# Patient Record
Sex: Female | Born: 1960
Health system: Southern US, Community
[De-identification: ages and names within clinical notes are randomized; demographics above are authoritative.]

## PROBLEM LIST (undated history)

## (undated) DIAGNOSIS — IMO0002 Reserved for concepts with insufficient information to code with codable children: Secondary | ICD-10-CM

## (undated) DIAGNOSIS — R3915 Urgency of urination: Secondary | ICD-10-CM

## (undated) DIAGNOSIS — R0600 Dyspnea, unspecified: Secondary | ICD-10-CM

## (undated) DIAGNOSIS — N821 Other female urinary-genital tract fistulae: Secondary | ICD-10-CM

## (undated) DIAGNOSIS — E785 Hyperlipidemia, unspecified: Secondary | ICD-10-CM

## (undated) DIAGNOSIS — Z9889 Other specified postprocedural states: Secondary | ICD-10-CM

## (undated) DIAGNOSIS — R87619 Unspecified abnormal cytological findings in specimens from cervix uteri: Secondary | ICD-10-CM

## (undated) DIAGNOSIS — E041 Nontoxic single thyroid nodule: Secondary | ICD-10-CM

## (undated) DIAGNOSIS — R112 Nausea with vomiting, unspecified: Secondary | ICD-10-CM

## (undated) DIAGNOSIS — A419 Sepsis, unspecified organism: Secondary | ICD-10-CM

## (undated) DIAGNOSIS — D649 Anemia, unspecified: Secondary | ICD-10-CM

## (undated) DIAGNOSIS — R5383 Other fatigue: Secondary | ICD-10-CM

## (undated) DIAGNOSIS — R7303 Prediabetes: Secondary | ICD-10-CM

## (undated) DIAGNOSIS — C539 Malignant neoplasm of cervix uteri, unspecified: Secondary | ICD-10-CM

## (undated) DIAGNOSIS — N6320 Unspecified lump in the left breast, unspecified quadrant: Secondary | ICD-10-CM

## (undated) DIAGNOSIS — R350 Frequency of micturition: Secondary | ICD-10-CM

## (undated) HISTORY — DX: Unspecified abnormal cytological findings in specimens from cervix uteri: R87.619

## (undated) HISTORY — DX: Hyperlipidemia, unspecified: E78.5

## (undated) HISTORY — PX: TUBAL LIGATION: SHX77

## (undated) HISTORY — PX: ABDOMINAL HYSTERECTOMY: SHX81

## (undated) HISTORY — DX: Reserved for concepts with insufficient information to code with codable children: IMO0002

## (undated) HISTORY — PX: OTHER SURGICAL HISTORY: SHX169

---

## 2001-07-23 ENCOUNTER — Other Ambulatory Visit: Admission: RE | Admit: 2001-07-23 | Discharge: 2001-07-23 | Payer: Self-pay | Admitting: Obstetrics & Gynecology

## 2001-09-10 ENCOUNTER — Encounter: Admission: RE | Admit: 2001-09-10 | Discharge: 2001-09-10 | Payer: Self-pay | Admitting: Obstetrics and Gynecology

## 2001-11-07 ENCOUNTER — Inpatient Hospital Stay (HOSPITAL_COMMUNITY): Admission: AD | Admit: 2001-11-07 | Discharge: 2001-11-09 | Payer: Self-pay | Admitting: *Deleted

## 2003-07-06 ENCOUNTER — Encounter: Admission: RE | Admit: 2003-07-06 | Discharge: 2003-07-06 | Payer: Self-pay | Admitting: Obstetrics and Gynecology

## 2003-07-31 ENCOUNTER — Ambulatory Visit (HOSPITAL_COMMUNITY): Admission: RE | Admit: 2003-07-31 | Discharge: 2003-07-31 | Payer: Self-pay | Admitting: Obstetrics and Gynecology

## 2003-07-31 ENCOUNTER — Encounter (INDEPENDENT_AMBULATORY_CARE_PROVIDER_SITE_OTHER): Payer: Self-pay | Admitting: Specialist

## 2003-08-15 ENCOUNTER — Encounter: Admission: RE | Admit: 2003-08-15 | Discharge: 2003-08-15 | Payer: Self-pay | Admitting: Obstetrics and Gynecology

## 2003-09-15 ENCOUNTER — Encounter: Admission: RE | Admit: 2003-09-15 | Discharge: 2003-09-15 | Payer: Self-pay | Admitting: Obstetrics and Gynecology

## 2003-10-12 ENCOUNTER — Encounter: Admission: RE | Admit: 2003-10-12 | Discharge: 2003-10-12 | Payer: Self-pay | Admitting: Obstetrics and Gynecology

## 2004-01-04 ENCOUNTER — Emergency Department (HOSPITAL_COMMUNITY): Admission: EM | Admit: 2004-01-04 | Discharge: 2004-01-04 | Payer: Self-pay | Admitting: Emergency Medicine

## 2004-01-07 ENCOUNTER — Emergency Department (HOSPITAL_COMMUNITY): Admission: EM | Admit: 2004-01-07 | Discharge: 2004-01-07 | Payer: Self-pay | Admitting: Emergency Medicine

## 2004-01-27 ENCOUNTER — Emergency Department (HOSPITAL_COMMUNITY): Admission: EM | Admit: 2004-01-27 | Discharge: 2004-01-27 | Payer: Self-pay | Admitting: Emergency Medicine

## 2005-02-01 ENCOUNTER — Inpatient Hospital Stay (HOSPITAL_COMMUNITY): Admission: AD | Admit: 2005-02-01 | Discharge: 2005-02-01 | Payer: Self-pay | Admitting: Obstetrics & Gynecology

## 2005-02-14 ENCOUNTER — Encounter: Admission: RE | Admit: 2005-02-14 | Discharge: 2005-02-14 | Payer: Self-pay | Admitting: Obstetrics

## 2005-05-27 ENCOUNTER — Inpatient Hospital Stay (HOSPITAL_COMMUNITY): Admission: AD | Admit: 2005-05-27 | Discharge: 2005-05-29 | Payer: Self-pay | Admitting: Obstetrics

## 2005-05-28 ENCOUNTER — Encounter (INDEPENDENT_AMBULATORY_CARE_PROVIDER_SITE_OTHER): Payer: Self-pay | Admitting: Specialist

## 2007-06-17 ENCOUNTER — Ambulatory Visit: Payer: Self-pay | Admitting: Obstetrics & Gynecology

## 2007-06-25 ENCOUNTER — Ambulatory Visit: Payer: Self-pay | Admitting: Gynecology

## 2007-06-25 ENCOUNTER — Encounter: Payer: Self-pay | Admitting: Obstetrics and Gynecology

## 2007-10-13 ENCOUNTER — Ambulatory Visit: Payer: Self-pay | Admitting: Gynecology

## 2007-12-02 ENCOUNTER — Ambulatory Visit: Payer: Self-pay | Admitting: Gynecology

## 2007-12-02 ENCOUNTER — Encounter (INDEPENDENT_AMBULATORY_CARE_PROVIDER_SITE_OTHER): Payer: Self-pay | Admitting: Gynecology

## 2008-08-22 ENCOUNTER — Emergency Department (HOSPITAL_COMMUNITY): Admission: EM | Admit: 2008-08-22 | Discharge: 2008-08-22 | Payer: Self-pay | Admitting: Emergency Medicine

## 2008-09-20 ENCOUNTER — Ambulatory Visit: Payer: Self-pay | Admitting: Obstetrics and Gynecology

## 2008-09-20 ENCOUNTER — Encounter: Payer: Self-pay | Admitting: Obstetrics and Gynecology

## 2008-09-27 ENCOUNTER — Ambulatory Visit: Payer: Self-pay | Admitting: Obstetrics and Gynecology

## 2008-09-27 ENCOUNTER — Inpatient Hospital Stay (HOSPITAL_COMMUNITY): Admission: AD | Admit: 2008-09-27 | Discharge: 2008-09-28 | Payer: Self-pay | Admitting: Obstetrics & Gynecology

## 2008-09-28 ENCOUNTER — Ambulatory Visit: Payer: Self-pay | Admitting: Obstetrics & Gynecology

## 2008-09-28 ENCOUNTER — Ambulatory Visit (HOSPITAL_COMMUNITY): Admission: RE | Admit: 2008-09-28 | Discharge: 2008-09-28 | Payer: Self-pay | Admitting: Obstetrics & Gynecology

## 2008-10-04 ENCOUNTER — Encounter: Admission: RE | Admit: 2008-10-04 | Discharge: 2008-10-04 | Payer: Self-pay | Admitting: Obstetrics & Gynecology

## 2008-10-04 ENCOUNTER — Ambulatory Visit: Payer: Self-pay | Admitting: Obstetrics and Gynecology

## 2008-10-25 ENCOUNTER — Ambulatory Visit: Payer: Self-pay | Admitting: Obstetrics & Gynecology

## 2008-11-29 ENCOUNTER — Ambulatory Visit: Payer: Self-pay | Admitting: Obstetrics & Gynecology

## 2008-12-06 ENCOUNTER — Ambulatory Visit: Payer: Self-pay | Admitting: Obstetrics and Gynecology

## 2008-12-28 ENCOUNTER — Ambulatory Visit: Payer: Self-pay | Admitting: Obstetrics & Gynecology

## 2009-01-04 ENCOUNTER — Ambulatory Visit: Payer: Self-pay | Admitting: Obstetrics & Gynecology

## 2009-01-05 ENCOUNTER — Encounter: Payer: Self-pay | Admitting: Obstetrics and Gynecology

## 2009-03-12 ENCOUNTER — Ambulatory Visit: Payer: Self-pay | Admitting: Obstetrics & Gynecology

## 2009-03-12 ENCOUNTER — Ambulatory Visit (HOSPITAL_COMMUNITY): Admission: RE | Admit: 2009-03-12 | Discharge: 2009-03-12 | Payer: Self-pay | Admitting: Obstetrics & Gynecology

## 2009-03-29 ENCOUNTER — Ambulatory Visit: Payer: Self-pay | Admitting: Obstetrics and Gynecology

## 2009-05-09 ENCOUNTER — Ambulatory Visit: Payer: Self-pay | Admitting: Obstetrics and Gynecology

## 2009-12-21 ENCOUNTER — Ambulatory Visit: Payer: Self-pay | Admitting: Obstetrics & Gynecology

## 2009-12-21 ENCOUNTER — Other Ambulatory Visit: Admission: RE | Admit: 2009-12-21 | Discharge: 2009-12-21 | Payer: Self-pay | Admitting: Gynecology

## 2010-01-04 ENCOUNTER — Ambulatory Visit: Payer: Self-pay | Admitting: Obstetrics and Gynecology

## 2010-01-15 ENCOUNTER — Ambulatory Visit (HOSPITAL_COMMUNITY): Admission: RE | Admit: 2010-01-15 | Discharge: 2010-01-15 | Payer: Self-pay | Admitting: Family Medicine

## 2010-03-06 ENCOUNTER — Encounter (INDEPENDENT_AMBULATORY_CARE_PROVIDER_SITE_OTHER): Payer: Self-pay | Admitting: Family Medicine

## 2010-03-06 ENCOUNTER — Ambulatory Visit: Payer: Self-pay | Admitting: Internal Medicine

## 2010-03-06 LAB — CONVERTED CEMR LAB
BUN: 12 mg/dL (ref 6–23)
Basophils Absolute: 0 10*3/uL (ref 0.0–0.1)
Basophils Relative: 1 % (ref 0–1)
Chloride: 105 meq/L (ref 96–112)
Cholesterol: 187 mg/dL (ref 0–200)
Creatinine, Ser: 0.6 mg/dL (ref 0.40–1.20)
Eosinophils Absolute: 0.2 10*3/uL (ref 0.0–0.7)
Eosinophils Relative: 3 % (ref 0–5)
Glucose, Bld: 100 mg/dL — ABNORMAL HIGH (ref 70–99)
LDL Cholesterol: 115 mg/dL — ABNORMAL HIGH (ref 0–99)
Lymphocytes Relative: 33 % (ref 12–46)
MCV: 81.5 fL (ref 78.0–100.0)
Monocytes Absolute: 0.4 10*3/uL (ref 0.1–1.0)
Sodium: 139 meq/L (ref 135–145)
TSH: 1.4 microintl units/mL (ref 0.350–4.500)
Total Protein: 7.9 g/dL (ref 6.0–8.3)
VLDL: 29 mg/dL (ref 0–40)
Vit D, 25-Hydroxy: 35 ng/mL (ref 30–89)
Vitamin B-12: 609 pg/mL (ref 211–911)
WBC: 5.7 10*3/uL (ref 4.0–10.5)

## 2010-04-04 LAB — CONVERTED CEMR LAB: Pap Smear: NEGATIVE

## 2010-04-16 ENCOUNTER — Ambulatory Visit: Payer: Self-pay | Admitting: Internal Medicine

## 2010-05-20 ENCOUNTER — Encounter (INDEPENDENT_AMBULATORY_CARE_PROVIDER_SITE_OTHER): Payer: Self-pay | Admitting: *Deleted

## 2010-05-20 LAB — CONVERTED CEMR LAB
Basophils Absolute: 0 10*3/uL (ref 0.0–0.1)
Basophils Relative: 1 % (ref 0–1)
Eosinophils Absolute: 0.1 10*3/uL (ref 0.0–0.7)
Eosinophils Relative: 2 % (ref 0–5)
Hemoglobin: 11.5 g/dL — ABNORMAL LOW (ref 12.0–15.0)
Lymphocytes Relative: 29 % (ref 12–46)
MCV: 85.6 fL (ref 78.0–100.0)
Monocytes Absolute: 0.4 10*3/uL (ref 0.1–1.0)
Neutro Abs: 3.5 10*3/uL (ref 1.7–7.7)
RBC: 4.31 M/uL (ref 3.87–5.11)

## 2010-11-09 LAB — BASIC METABOLIC PANEL
Chloride: 101 mEq/L (ref 96–112)
GFR calc Af Amer: >60 mL/min (ref >60)
GFR calc non Af Amer: >60 mL/min (ref >60)

## 2010-11-09 LAB — POCT URINALYSIS DIP (DEVICE)
Glucose, UA: NEGATIVE mg/dL
Specific Gravity, Urine: 1.015 (ref 1.005–1.030)
Urobilinogen, UA: 0.2 mg/dL (ref 0.0–1.0)
pH: 7 (ref 5.0–8.0)

## 2010-11-09 LAB — CBC
MCHC: 34.2 g/dL (ref 30.0–36.0)
RBC: 4.16 MIL/uL (ref 3.87–5.11)

## 2010-11-09 LAB — PREGNANCY, URINE: Preg Test, Ur: NEGATIVE

## 2010-11-18 LAB — URINALYSIS, ROUTINE W REFLEX MICROSCOPIC
Glucose, UA: NEGATIVE mg/dL
Ketones, ur: NEGATIVE mg/dL
Protein, ur: NEGATIVE mg/dL
Specific Gravity, Urine: 1.004 — ABNORMAL LOW (ref 1.005–1.030)
Urobilinogen, UA: 0.2 mg/dL (ref 0.0–1.0)

## 2010-11-18 LAB — GLUCOSE, CAPILLARY

## 2010-11-18 LAB — POCT PREGNANCY, URINE: Preg Test, Ur: NEGATIVE

## 2010-11-18 LAB — WET PREP, GENITAL: Trich, Wet Prep: NONE SEEN

## 2010-11-18 LAB — GC/CHLAMYDIA PROBE AMP, GENITAL: Chlamydia, DNA Probe: NEGATIVE

## 2010-11-18 LAB — URINE MICROSCOPIC-ADD ON

## 2010-11-19 LAB — POCT URINALYSIS DIP (DEVICE)
Bilirubin Urine: NEGATIVE
Glucose, UA: NEGATIVE mg/dL
Ketones, ur: NEGATIVE mg/dL
Protein, ur: NEGATIVE mg/dL
Specific Gravity, Urine: 1.01 (ref 1.005–1.030)

## 2010-12-17 NOTE — Group Therapy Note (Signed)
Amy Morrow, Amy Morrow NO.:  [id number]   MEDICAL RECORD NO.:  [id number]          PATIENT TYPE:  WOC   LOCATION:  [facility]                   FACILITY:  [facility]   PHYSICIAN:  Argentina Donovan, MD        DATE OF BIRTH:  1961-07-26   DATE OF SERVICE:                                  CLINIC NOTE   HISTORY:  The patient is a 50 year old Hispanic female gravida 5, para 5-  0-0-5 who is referred by the emergency room at [facility] for  urinary frequency, dysuria, pelvic pain and was treated with several  episodes in the last few months with Cipro with no relief.  She  apparently has had this problem prior to the birth of her last baby  which was 3 years ago.  Since December, it has gotten to be extremely  disabling.  She cannot even go to the store without looking for a  bathroom.  She was previously seen by Dr. Alfonzo Feller who was trying  to get her scheduled for a vaginal hysterectomy with A&P repair and TVT,  but could not to get it accomplished for financial reasons.  In addition  to that, she had previously had a LEEP for abnormal Pap smears.  Had her  last one last May which was normal.  We will repeat that today.   PHYSICAL EXAMINATION:  ABDOMEN:  Soft, flat, nontender.  No masses or  organomegaly.  GU:  She points to the immediate suprapubic area for where she has most  of her discomfort.  On examination, external genitalia is normal.  BUS  within normal limits.  There is marked descensus of her bladder and  obvious coughing uncontrollable squirts of urine.  The bulk of the  bladder is significantly beneath the urethra.  The uterus seems of  normal size, shape, consistency.  Adnexa could not be well palpated in  this slightly pudgy patient of 58 inches and 155 pounds.  Pap smear was  taken.  The vagina is clean and well rugated.  GENERAL:  Other than this, she is in good health.  VITAL SIGNS:  Blood pressure is normal at 128/85, pulse is 93 per   minute.   I think this patient does need surgery.  We are going to attempt to  relieve her symptoms to some degree.  I have put in a pessary number 7  to hopefully elevate the bladder and relieve some of her stress  incontinence.  I am going to try her on some Detrol because I think she  probably has either a hyperactive bladder or interstitial cystitis.  I  will try her on some peridium knowing that the Detrol will take several  weeks to work.  I am still going to have her come back so I can evaluate  that pessary and if she is feeling any better in a week.  Meanwhile, we  are going to be working on financial situations to see if this woman can  have definitive surgery.   IMPRESSION:  Urinary stress incontinence secondary to uterine prolapse  with marked cystorectocele.  Also chronic  dysuria, probable overactive  bladder, possible interstitial cystitis.           ______________________________  Argentina Donovan, MD     PR/MEDQ  D:  09/20/2008  T:  09/20/2008  Job:  [id number]

## 2010-12-17 NOTE — Group Therapy Note (Signed)
Amy Morrow, Amy Morrow NO.:  [id number]   MEDICAL RECORD NO.:  [id number]          PATIENT TYPE:  WOC   LOCATION:  [facility]                   [facility]ACILITY:  [facility]   PHYSICIAN:  Ginger Carne, MD DATE OF BIRTH:  1961/01/29   DATE OF SERVICE:  12/02/2007                                  CLINIC NOTE   This patient is a 50 year old Hispanic female who returns today for  reevaluation for previously scheduled surgery.  She has genuine urinary  stress incontinence without evidence of an overactive bladder,  a second  degree rectocele, second-degree cystocele, and first-degree uterovaginal  prolapse.  Discussion with the patient regarding her symptoms led to the  option of a pessary which she adamantly declined.  She had not contacted  the Financial Services department prior to her operation that was  scheduled on Dec 07, 2007 to discuss her financial options regarding  having a total vaginal hysterectomy anterior posterior repair, TVT with  cystoscopy, and uterosacral ligament vaginal vault suspension.  Therefore, the surgery was cancelled. The patient was counseled that it  would be necessary to discuss these matters with a financial counselor  prior to said surgery.  However, this was not considered life-  threatening and that a pessary would be advisable   The patient was seen in November of 2008. 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  Apparently the Pap smear from November 2008 was not brought to anyone's  attention, and the colposcopy had not been scheduled. Because it was  over 5 months since this last Pap smear was performed  I discussed with  the patient the option of performing colposcopy now and/or repeating a  Pap smear.  She opted for a repeat Pap smear and the agreement that if  it is abnormal, we will proceed with a colposcopy. I have  personally  assured the patient that I would call her with the results through an  interpreter. The patient was reassured that the nature of this Pap smear  was not significant as far as the possibility of a carcinoma of the  cervix and that no harm was done.  The patient was reassured and is not  upset.  I did explain to the patient that once her results are received  I would contact her. If her Pap smear is normal we will repeat another  Pap in 6 months to follow.  All questions were answered to the  satisfaction of said patient, and the patient verbalized understanding  of same through the interpreter.           ______________________________  Ginger Carne, MD     SHB/MEDQ  D:  12/02/2007  T:  12/02/2007  Job:  [id number]

## 2010-12-17 NOTE — Group Therapy Note (Signed)
Amy Morrow, Amy Morrow NO.:  [id number]   MEDICAL RECORD NO.:  [id number]          PATIENT TYPE:  WOC   [facility]ATION[facility]                   [facility]   PHYSICIAN:  Scheryl Darter, MD       DATE OF BIRTH:  [birth year]   DATE OF SERVICE:                                  CLINIC NOTE   The patient returns today for evaluation of stress urinary incontinence.  She was placed on Detrol at her last visit which she says has not helped  her symptoms much.  She says with the pessary in place, she does not  leak urine and does not have to get up to urinate at night as often.  She would like to have surgery if it is indicated, in order to correct  her stress urinary incontinence.  She is not interested in learning to  manage the pessary herself.  Pessary was removed and cleaned.  Urodynamics were done by placing a 10-French Foley catheter.  Her post  void residual was less than 10 mL.  Specimen was sent for urinalysis.  Sterile water was introduced into the bladder.  She had some mild  symptoms of urge at about 120 mL and this became more intense at 150 mL.  About 180 mL was introduced into the bladder.  There was no sign of  detrusor instability during the procedure.  The catheter was removed and  stress incontinence was demonstrated with a cough.  She was allowed to  void after that.   IMPRESSION:  Genuine stress urinary incontinence.   PLAN:  I offered Gynecare TVT.  We discussed the procedure and the risks  of failure, bleeding, infection or urinary tract damage during the  procedure.  The discussion was via interpreter.  She voiced  understanding.  She wishes to schedule this procedure as an outpatient.  The pessary was replaced today.      Scheryl Darter, MD     JA/MEDQ  D:  01/04/2009  T:  01/04/2009  Job:  [id number]

## 2010-12-17 NOTE — Group Therapy Note (Signed)
NAMELALISA, KIEHN NO.:  [id number]   MEDICAL RECORD NO.:  [id number]          PATIENT TYPE:  WOC   [facility]ATION[facility]                   [facility]   PHYSICIAN:  Scheryl Darter, MD       DATE OF BIRTH:  [birth year]   DATE OF SERVICE:  12/28/2008                                  CLINIC NOTE   The patient returns today after having a pessary placed by Dr. Okey Dupre on  Dec 06, 2008.  She says that she has no discomfort with the pessary,  although sometimes it feels as though it is trying to come out, but she  is able to push it back without any problems.  She does not want to try  to remove it and replace it.  It definitely has helped with  incontinence.  She is currently on Detrol LA and she says that helps  with getting up at night to urinate.  Physical exam external genitalia  appears normal pessary was removed and cleaned.  Vaginal mucosa appears  normal.  Uterus seems to be well supported.  She has minimal cystocele  and rectocele when she is supine with coughing. I did not note any  incontinence.  Upon standing she does demonstrate some mild pelvic  relaxation but no more than a first-degree cystocele to my exam.  She  may be a candidate to have a TVT for urinary incontinence.  I will  replace the pessary today.  She will return to do urodynamics in the  office.  She has a note stating that he she has 100% discount on her  hospital account, and I am told that this is good for a year.      Scheryl Darter, MD     JA/MEDQ  D:  12/28/2008  T:  12/28/2008  Job:  [id number]

## 2010-12-17 NOTE — Group Therapy Note (Signed)
NAMEKHALIS, HITTLE NO.:  [id number]   MEDICAL RECORD NO.:  [id number]          PATIENT TYPE:  WOC   [facility]ATION[facility]                   [facility]   PHYSICIAN:  Argentina Donovan, MD        DATE OF BIRTH:  [birth year]   DATE OF SERVICE:                                  CLINIC NOTE   The patient is a 50 year old Spanish-speaking Hispanic female gravida 5,  para 5-0-0-5 who has been seen by [place] since February 2010 because of  severe urinary stress incontinence, 2+ to 3+ cystocele with the mild  uterine prolapse.  This patient truly needs a vaginal hysterectomy with  an A and P repair and a TVT but has had financial reasons that this has  not been able to be done yet.  We are trying to get her cleared for  this.  She has been successful using a #6 pessary, but apparently during  the last time it was placed, the patient suffered pain and had to remove  it.  We replaced that today, told her if that is still bothering her  that we could use an inflatable one that she could remove to have coitus  and then reinsert.  We will teach her how to do that.  I really would  like to see this patient qualify for some help and get this definitive  surgery done.  She is going to come back in 2 weeks.  We will see how  she is doing, and meanwhile, she has been communicating with the people  to try and arrange the surgery.  We will see what we can do to expedite  that problem.           ______________________________  Argentina Donovan, MD     PR/MEDQ  D:  12/06/2008  T:  12/06/2008  Job:  [id number]

## 2010-12-17 NOTE — Group Therapy Note (Signed)
NAMEANIQUA, BRIERE NO.:  [id number]   MEDICAL RECORD NO.:  [id number]          PATIENT TYPE:  WOC   [facility]ATION[facility]                   [facility]ACILITY[facility]   PHYSICIAN:  Sid Falcon, CNM  DATE OF BIRTH:  06-29-1961   DATE OF SERVICE:                                  CLINIC NOTE   REASON FOR VISIT:  Postop check after a TVT performed on March 12, 2009,  by Dr. Scheryl Darter.  The patient is here with reports of decreased rest  incontinence; however, has had an increase of urinary urge, in which she  feels she needs to go multiple times a day; however, those of small  amount of urine.  Pain is intermittent occurring approximately 1-2 times  a day, relief with pain medication.  Here to discuss the possibility of  having a hysterectomy concerned because she still has lower pelvic pain  that is the same as prior to the surgery.  I would like to talk about a  possible hysterectomy.   PHYSICAL EXAMINATION:  GENERAL:  Alert and oriented x3.  No signs of  acute distress.  VITAL SIGNS:  Stable.  Temperature 98.4, pulse 76, blood pressure  111/68.  ABDOMEN:  Incision site well healed.  No signs of infection.  VAGINA:  No evidence of urine with forced coughing by the patient.  Area  is pink.  No signs of redness or abnormal discharge.   ASSESSMENT:  1. Pelvic pain.  2. Urge incontinence.   PLAN:  Discussed with patient to continue with pain medication.  We will  reschedule an appointment with Dr. Marice Potter to discuss possible surgical  procedure in 1-2 weeks.      Sid Falcon, CNM     WM/MEDQ  D:  03/29/2009  T:  03/30/2009  Job:  [id number]

## 2010-12-17 NOTE — Op Note (Signed)
Amy Morrow, Amy Morrow   MEDICAL RECORD NO.:  [id number]          PATIENT TYPE:  AMB   LOCATION:  [facility]                           [facility]   PHYSICIAN:  Scheryl Darter, MD       DATE OF BIRTH:  [birth year]   DATE OF PROCEDURE:  DATE OF DISCHARGE:  03/12/2009                               OPERATIVE REPORT   PROCEDURE:  Gynecare tension-free vaginal tape.   PREOPERATIVE DIAGNOSIS:  Stress urinary incontinence.   POSTOPERATIVE DIAGNOSIS:  Stress urinary incontinence.   SURGEON:  Scheryl Darter, MD   ANESTHESIA:  General.   ESTIMATED BLOOD LOSS:  50 mL.   COMPLICATIONS:  None.   DRAINS:  None.   COUNTS:  Correct.   OPERATIVE COURSE:  The patient gave written consent for Gynecare TVT for  stress urinary incontinence.  The patient identification was confirmed.  She was brought to the OR and general anesthesia was induced.  She was  placed in dorsal lithotomy position.  The perineum, vagina, and lower  abdomen were sterilely prepped and draped.  Her exam revealed minimal  pelvic relaxation.  Uterus was normal size.  A 0.5% Marcaine with  1:100,000 epinephrine was infiltrated at the vagina just below the  urethra at the midline and to the side of the urethra.  Likewise, this  was infiltrated in two spots suprapubically and behind the pubic bone.  A #11 blade was used to make a stab incision 2 cm off the midline on  both sides just above the symphysis pubis.  Likewise blade was used to  make a 1 cm incision 1 cm below the urethra at the midline of vagina.  The vaginal mucosa was undermined to the side with Metzenbaum scissors.  Bladder was drained with a Foley catheter and the catheter was removed  and replaced with Foley with a rigid guide.  This was used to manipulate  the urethra during the procedure.  The urethra was deviated away from  the right side and using the TVT needles.  Through the vaginal route,  the needle was introduced to the  right side through the incision and was  guided carefully behind the symphysis pubis and up through the anterior  abdominal wall and through the incision that was made on the right.  Cystoscopy was then done with sterile water and the video camera was  then used and there was no trauma in the bladder seen.  Likewise the  second needle was introduced in the left side of the urethra and behind  symphysis pubis and through the left incision suprapubically.  Repeat  cystoscopy likewise showed normal bladder.  Bladder was drained between  each insertion.  The needles were then pulled through and the TVT tape  was pulled up just beneath the urethra with a hemostat used to assure  that it was in tension free position.  The needles were then cut loose  and the plastic sheaths were removed.  The tape was trimmed just below  the skin.  The skin incisions were closed with interrupted 4-0 Vicryl  subcuticular  sutures.  The vaginal incision was closed with 2-0 Vicryl in running  locking suture and good hemostasis was seen at the end of procedure.  The patient tolerated procedure well without complications.  She was  brought in stable condition to recovery room.      Scheryl Darter, MD  Electronically Signed     JA/MEDQ  D:  03/12/2009  T:  03/13/2009  Job:  [id number]

## 2010-12-20 NOTE — Op Note (Signed)
Amy Morrow, Amy Morrow              ACCOUNT NO.:  [id number]   MEDICAL RECORD NO.:  [id number]          PATIENT TYPE:  INP   LOCATION:  9117                          [facility]   PHYSICIAN:  Kathreen Cosier, M.D.DATE OF BIRTH:  1961/03/20   DATE OF PROCEDURE:  05/28/2005  DATE OF DISCHARGE:                                 OPERATIVE REPORT   PREOPERATIVE DIAGNOSIS:  Multiparity.   OPERATION/PROCEDURE:  Postpartum tubal ligation.   ANESTHESIA:  Epidural.   DESCRIPTION OF PROCEDURE:  The patient was placed in the supine position,  abdomen was prepped and draped. Bladder emptied with straight catheter.  Midline subumbilical incision one inch long was made, carried down to the  fascia.  Fascia was cleaned and grasped with two Kochers.  The fascia and  peritoneum opened.  The left tube was grasped in the mid portion with the  Babcock clamp.  A 0 plain suture was placed on the mesosalpinx below the  portion of the tube within the clamp.  This was tied.  Approximately one  inch of tube cut.  Hemostasis satisfactory.  Procedure was done in exact  fashion on the other side.  Abdomen closed in layers.  Peritoneum and fascia  with continuous suture with 0 Dexon.  Skin closed with subcuticular stitch  of 4-0 Monocryl.  Blood loss minimal.  The patient tolerated the procedure  well and taken to the recovery room in good condition.           ______________________________  Kathreen Cosier, M.D.     BAM/MEDQ  D:  05/28/2005  T:  05/28/2005  Job:  [id number]

## 2010-12-20 NOTE — Discharge Summary (Signed)
NAMEJAYLEENA, Amy Morrow NO.:  [id number]   MEDICAL RECORD NO.:  [id number]          PATIENT TYPE:  INP   LOCATION:  9117                          [facility]   PHYSICIAN:  Kathreen Cosier, M.D.DATE OF BIRTH:  01-21-1961   DATE OF ADMISSION:  05/27/2005  DATE OF DISCHARGE:  05/29/2005                                 DISCHARGE SUMMARY   Patient is a 51 year old gravida 6, para 4-0-1-4, [facility] June 03, 2005 who  was admitted in labor and had a normal vaginal delivery.  Patient desired  sterilization and on October 25 underwent postpartum tubal ligation.  On  admission her hemoglobin was 11.2, postoperative 8.4, platelets 273/225,  white count 7.9/10.3.  RPR nonreactive.  The patient did well postpartum and  was discharged on the second postpartum day with a hemoglobin 8.4, to see me  in six weeks.   DISCHARGE MEDICATIONS:  Tylenol No.3, ferrous sulfate.   DISCHARGE DIAGNOSES:  Status post normal vaginal delivery at term and  postpartum tubal ligation.           ______________________________  Kathreen Cosier, M.D.     BAM/MEDQ  D:  06/18/2005  T:  06/18/2005  Job:  [id number]

## 2010-12-20 NOTE — Op Note (Signed)
Amy Morrow, Amy Morrow                          ACCOUNT NO.:  [id number]   MEDICAL RECORD NO.:  [id number]                   PATIENT TYPE:  AMB   LOCATION:  [facility]                                  [facility]   PHYSICIAN:  Phil D. Okey Dupre, M.D.                  DATE OF BIRTH:  [birth year]   DATE OF PROCEDURE:  07/31/2003  DATE OF DISCHARGE:                                 OPERATIVE REPORT   PREOPERATIVE DIAGNOSES:  1. Metrorrhagia.  2. Moderate cervical dysplasia, cervical intraepithelial neoplasia II.   POSTOPERATIVE DIAGNOSIS:  Pending pathology report.   PROCEDURES:  1. Dilatation and curettage.  2. Loop electrosurgical excision procedure conization of the cervix.   SURGEON:  Javier Glazier. Okey Dupre, M.D.   ANESTHESIA:  MAC plus local.   ESTIMATED BLOOD LOSS:  10 mL.   The procedure went as follows:  Under satisfactory MAC sedation with the  patient in the dorsal lithotomy position, the perineum and vagina prepped  and draped in the usual sterile manner, bimanual pelvic examination revealed  a uterus of normal size, shape, consistency, anteflexed, freely movable, and  normal, free adnexa.  A weighted speculum was placed in the posterior  fourchette of the vagina.  The anterior lip of a hypertrophied cervix was  grasped with a single-tooth tenaculum.  Xylocaine 1% 10 mL was injected in  each of the lateral paracervical angles at 8 and 4 o'clock for a  paracervical block.  Then 1 mL of 1% Xylocaine with 1:100,000 epinephrine  was injected in four areas each of the cervix at 1, 4, 8, and 10 o'clock for  hemostasis.  The uterine cavity was then sounded to a depth of 7 cm and the  cervical os dilated to a #7 Hegar dilator and the uterine cavity curetted  vigorously with a small serrated curette followed by exploration with the  polyp forceps, and the small amount of tissue that was obtained was sent for  pathologic diagnosis.  A 2 x 0.8 cm LEEP electrical loop was used to take a  loop biopsy  of the cervix using the 60 current.  This was sent for  pathologic diagnosis.  The area around the entire biopsy site was then  coagulated with the hot cautery at 50 watts, the ball cautery, and bleeding  areas within the biopsy site were cauterized with the same cautery and then  packed with Monsel's solution.  There was minimal bleeding during the  procedure with a total blood loss of probably 10 mL.  The patient tolerated  the procedure well.  The speculum was removed from the vagina and the  patient transferred to the recovery room in satisfactory condition.  Phil D. Okey Dupre, M.D.    PDR/MEDQ  D:  07/31/2003  T:  07/31/2003  Job:  [id number]

## 2010-12-27 ENCOUNTER — Other Ambulatory Visit (HOSPITAL_COMMUNITY): Payer: Self-pay | Admitting: Family Medicine

## 2010-12-27 DIAGNOSIS — Z1231 Encounter for screening mammogram for malignant neoplasm of breast: Secondary | ICD-10-CM

## 2011-01-15 ENCOUNTER — Ambulatory Visit (HOSPITAL_COMMUNITY)
Admission: RE | Admit: 2011-01-15 | Discharge: 2011-01-15 | Disposition: A | Discharge status: Home / Self Care | Payer: Self-pay | Source: Ambulatory Visit | Attending: Family Medicine | Admitting: Family Medicine

## 2011-01-15 DIAGNOSIS — Z1231 Encounter for screening mammogram for malignant neoplasm of breast: Secondary | ICD-10-CM

## 2011-03-07 ENCOUNTER — Other Ambulatory Visit: Payer: Self-pay

## 2011-04-30 ENCOUNTER — Inpatient Hospital Stay (INDEPENDENT_AMBULATORY_CARE_PROVIDER_SITE_OTHER)
Admission: RE | Admit: 2011-04-30 | Discharge: 2011-04-30 | Disposition: A | Discharge status: Home / Self Care | Payer: Self-pay | Source: Ambulatory Visit | Attending: Emergency Medicine | Admitting: Emergency Medicine

## 2011-04-30 DIAGNOSIS — J309 Allergic rhinitis, unspecified: Secondary | ICD-10-CM

## 2011-04-30 DIAGNOSIS — R05 Cough: Secondary | ICD-10-CM

## 2011-06-11 ENCOUNTER — Encounter: Payer: Self-pay | Admitting: *Deleted

## 2011-06-11 ENCOUNTER — Other Ambulatory Visit (HOSPITAL_COMMUNITY)
Admission: RE | Admit: 2011-06-11 | Discharge: 2011-06-11 | Disposition: A | Discharge status: Home / Self Care | Payer: Self-pay | Source: Ambulatory Visit | Attending: Family Medicine | Admitting: Family Medicine

## 2011-06-11 ENCOUNTER — Ambulatory Visit (INDEPENDENT_AMBULATORY_CARE_PROVIDER_SITE_OTHER): Payer: Self-pay | Admitting: Family Medicine

## 2011-06-11 VITALS — BP 142/82 | HR 92 | Temp 98.0°F | Ht 59.0 in | Wt 152.3 lb

## 2011-06-11 DIAGNOSIS — IMO0002 Reserved for concepts with insufficient information to code with codable children: Secondary | ICD-10-CM

## 2011-06-11 DIAGNOSIS — Z01812 Encounter for preprocedural laboratory examination: Secondary | ICD-10-CM

## 2011-06-11 DIAGNOSIS — R87612 Low grade squamous intraepithelial lesion on cytologic smear of cervix (LGSIL): Secondary | ICD-10-CM

## 2011-06-11 DIAGNOSIS — N870 Mild cervical dysplasia: Secondary | ICD-10-CM | POA: Insufficient documentation

## 2011-06-11 NOTE — Progress Notes (Signed)
Patient given informed consent, signed copy in the chart, time out was performed.  Placed in lithotomy position. Cervix viewed with speculum and colposcope after application of acetic acid.   Colposcopy adequate? yes Acetowhite lesions?11 o'clock Punctation?no Mosaicism?  no Abnormal vasculature?  no Biopsies?11 o'clock ECC?yes  Patient was given post procedure instructions.  She will return in 2-3 weeks for results.

## 2011-06-11 NOTE — Patient Instructions (Signed)
Colposcopa (Colposcopy)  La colposcopa es un procedimiento en el que se utiliza un microscopio luminoso especial (colposcopio). Sirve para examinar el cuello del tero y la vagina o la zona externa de la vagina, para buscar signos de enfermedad o anormalidades en las clulas. La derivarn a Music therapist (gineclogo) para Personal assistant. Durante el procedimiento podrn tomarle una biopsia (muestra de tejido), en caso que el profesional encuentre clulas anormales. La biopsia se enva al laboratorio para un anlisis completo y los resultados sern informados a su mdico. UNA MUJER PUEDE NECESITAR ESTE PROCEDIMIENTO SI:  Tiene un papanicolau anormal (muestra de clulas del cuello del tero).   Tiene una llaga en el cuello del tero y el papanicolau es normal).   El papanicolau indica la presencia del virus del papiloma humano (VPH). El virus puede producir verrugas genitales y est relacionado con el desarrollo de cncer cervical.   Se han observado verrugas genitales en el cuello del tero o en la zona externa de la vagina.   La madre de la paciente recibi el frmaco DES durante el [place].   Siente dolor durante las [employer].   Sufre hemorragias vaginales, especialmente despus de Sales promotion account executive.   Es Environmental consultant de un tratamiento previo.  ANTES DEL PROCEDIMIENTO  La colposcopa se realiza cuando no tenga el perodo menstrual.   Durante las 24 horas previas a la colposcopa no debe:   Child psychotherapist.   Usar tampones.   Aplicarse medicamentos, cremas o supositorios en la vagina.   Tener [employer].  PROCEDIMIENTO  La colposcopa se realiza estando la paciente recostada sobre su espalda, con los pies en los estribos de la camilla.   Dentro de la vagina se coloca el espculo para [place] y permitir al profesional la observacin del cuello del tero. Este es el mismo instrumento que se [place]  en el examen Papanicolau.   El colposcopio se coloca fuera de la vagina. Este instrumento se [place] para ampliar y examinar el cuello del tero, la vagina y la zona externa de la misma.   Se aplica una pequea cantidad de solucin lquida en la zona que se va a observar. La solucin se coloca con un aplicador de algodn. Este lquido facilita la observacin de clulas anormales.   El mdico aspirar la mucosidad y las clulas del canal del cuello del tero.   En ese momento podr tomar pequeas muestras de tejido para realizar una biopsia. Cuando se toma la biopsia podr sentir un pequeo dolor o molestia.   El Engineer, civil (consulting) la ubicacin de las zonas anormales y enviar las muestras de tejido al laboratorio para que sean [place].   Cuando el mdico realiza una biopsia de la vagina o de la zona externa, aplicar un anestsico local (novocana).  DESPUS DEL PROCEDIMIENTO  Podr sentir algunos clicos que desaparecern en algunos minutos. Sentir [employer].   Puede usar medicamentos de venta libre segn la indicacin de su mdico. No tome aspirina, ya que puede causar hemorragias.   Recustese algunos minutos si se siente mareada.   Podr tener un sangrado leve o una secrecin oscura que debe detenerse en [place].   Puede ser necesario que use apsitos durante [employer].  INSTRUCCIONES PARA EL CUIDADO DOMICILIARIO  Evite las [employer], las duchas vaginales y el uso de tampones durante Armed forces technical officer, o segn las indicaciones del mdico.   Tome slo la medicacin que le indic el profesional que la asiste.  Si utiliza pldoras anticonceptivas, contine tomndolas.   Durante su visita no contar con todos los [employer]. En este caso, tenga otra entrevista con su mdico para conocerlos. No piense que el resultado es normal si no tiene noticias de su mdico o de la institucin mdica. Es Copy seguimiento de todos los  [place] de [place].   Siga los consejos de su mdico con respecto a los [place], [place], visitas y Papanicolau de control.  SOLICITE ATENCIN MDICA SI:  Aparece una erupcin cutnea.   Tiene problemas con los medicamentos.  SOLICITE AYUDA MDICA DE INMEDIATO SI:  Tiene una hemorragia abundante o elimina cogulos.   La fiebre le sube a ms de 102 F (38.9 C), con o sin escalofros.   Observa flujo vaginal anormal.   Tiene clicos que no se alivian luego de tomar analgsicos.   Se siente mareada o sufre un desmayo.   Siente Physiological scientist.  Document Released: 07/21/2005 Document Revised: 04/02/2011 [facility] Patient Information 2012 [place], [place].

## 2011-07-11 ENCOUNTER — Encounter: Payer: Self-pay | Admitting: Advanced Practice Midwife

## 2011-07-11 ENCOUNTER — Ambulatory Visit (INDEPENDENT_AMBULATORY_CARE_PROVIDER_SITE_OTHER): Payer: Self-pay | Admitting: Advanced Practice Midwife

## 2011-07-11 VITALS — BP 116/77 | HR 92 | Temp 97.2°F | Ht 62.0 in | Wt 152.8 lb

## 2011-07-11 DIAGNOSIS — Z23 Encounter for immunization: Secondary | ICD-10-CM

## 2011-07-11 DIAGNOSIS — N870 Mild cervical dysplasia: Secondary | ICD-10-CM

## 2011-07-11 MED ORDER — INFLUENZA VIRUS VACC SPLIT PF IM SUSP
0.5000 mL | Freq: Once | INTRAMUSCULAR | Status: AC
  Administered 2011-07-11: 0.5 mL via INTRAMUSCULAR

## 2011-07-11 NOTE — Patient Instructions (Signed)
Papanicolau (Pap Test) El papanicolau consiste en la toma de [place] de clulas del cuello del tero de [place]. El cuello uterino es la abertura que comunica el tero con la vagina (canal de [place]). Las clulas se obtienen durante el examen plvico. Las clulas se observan al microscopio para ver si son normales o se estn desarrollando clulas cancerosas, o existen modificaciones que indican que se Sport and exercise psychologist. La displasia cervical es un trastorno en el que una mujer tiene modificaciones anormales en las clulas del cuello uterino. Estos cambios son un signo precoz de que puede Sport and exercise psychologist cervical. Tambin puede detectarse el virus del Engineer, technical sales (VPH) debido a que 4 tipos son responsables en el 70% de los casos de cncer cervical. Tambin puede detectar infecciones producidas por bacterias, hongos, protozoos y virus.  El cncer cervical es difcil de tratar y es menos probable que tenga un buen desenlace si no se trata. Detectar la enfermedad en sus estadios ms tempranos conduce a mejores resultados. Desde que se ha comenzado a Animal nutritionist, hace 60 aos, las muertes por cncer cervical disminuyeron en un 70%. Todas las mujeres deben hacer un examen Papanicolau. LOS FACTORES DE RIESGO PARA EL CNCER CERVICAL SON:   Ser sexualmente activa antes de los 18 aos.   Ser hija de una mujer que tom dietilstilbestrol (DES) durante el embarazo.   Tener un compaero sexual que ha sufrido cncer de pene.   Tener un compaero sexual cuya pareja haya sufrido cncer cervical o displasia cervical (las primeras modificaciones que indican que se puede Sport and exercise psychologist.   Debilidad en el sistema inmunolgico. Por ejemplo sufrir VIH u otros problemas de inmunodeficiencia.   Haber sufrido infecciones de transmisin sexual, como clamidia, gonorrea o VPH.   Tener un Papanicolau anormal o cncer en la vagina o la vulva.   Tener ms de un compaero sexual.   Stark Klein  de cncer cervical en la hermana o la madre.   No usar condones con un nuevo compaero sexual.   El consumo de cigarrillos.  Woody Seller UN PAPANICOLAU  Un Papanicolau se realiza para Film/video editor cervical.   El primer Papanicolaou debe realizarse los 21 aos.   [employer] 21 y los 29 aos debe repetirse [place].   Luego de los 30 aos, debe realizarse un Papanicolaou cada tres aos siempre que los 3 estudios anteriores sean normales.   Algunas mujeres sufren problemas mdicos que aumentan la probabilidad de Optician, dispensing cervical. Consulte a su mdico acerca de estos problemas. Es muy importante que le informe a su mdico si aparecen nuevos problemas poco despus de su ltimo Papanicolaou. En estos casos, el mdico podr indicar que se realice el Papanicolaou con ms frecuencia.   Estas recomendaciones son las mismas para todas las mujeres hayan recibido o no la vacuna para el VPH (virus del papiloma humano).   Si le han realizado una histerectoma por un problema que no era cncer u otra enfermedad que podra causar cncer, ya no necesitar un Papanicolaou. Sin embargo, aunque ya no necesite un Papanicolau, es una buena idea Chartered certified accountant examen regular para asegurarse de que no hay otros problemas.    Si tiene entre 25 y 27 aos y ha tenido un Papanicolaou normal en los ltimos 10 aos, ya no ser IT sales professional. Sin embargo, aunque ya no necesite un Papanicolau, es una buena idea Chartered certified accountant examen regular para asegurarse de que no hay otros problemas.  Si ha recibido Pharmacist, community para Management consultant cervical o para una enfermedad que podra causar cncer, necesitar realizar un Papanicolaou y controles durante al menos 20 aos de concluir el tratamiento   Si no ha tenido continuidad para [employer] Papanicolau, debern evaluarse nuevamente los factores de riesgo (como tener una nueva pareja sexual) para Chief Strategy Officer si deben [employer].    Algunas mujeres necesitarn realizarse estudios con ms frecuencia si tienen factores de riesgo para Engineer, structural.  PREPARACIN PARA EL PAPANICOLAU Debe realizarse durante las semanas anteriores a la [place]. No deben hacerse duchas vaginales ni tener [employer] 24 horas anteriores a la prueba. No colocarse cremas vaginales, diafragmas o tampones durante las 24 horas anteriores a la prueba. Para minimizar las molestias, podr vaciar la vejiga antes del examen. TOMA DE LA MUESTRA El profesional le har un examen plvico. Colocar un instrumento de metal o plstico (espculo) en la vagina. Esto se hace antes de que el mdico realice un examen bimanual de los rganos femeninos internos. Este instrumento le permite al mdico observar el interior de la vagina y Public affairs consultant el cuello uterino. Para tomar la [place] de las clulas de la abertura interna del cuello uterino, se utiliza un pequeo cepillo estril. Para raspar la zona externa del cuello uterino se utiliza una esptula de [place]. [place] de [employer]. Las [employer] se colocan en un portaobjetos de vidrio o en una pequea botella llena con un lquido especial. Luego las clulas se observarn en el microscopio de un laboratorio. Un especialista observar las clulas y determinar si son normales. RESULTADOS DEL PAPANICOLAU  Un Papanicolau normal no muestra clulas anormales ni evidencias de inflamacin.   La presencia de clulas que se desarrollan de manera anormal en la superficie del cuello uterino ser informada como un Papanicolau anormal. Se utilizan diferentes categoras de Librarian, academic. El profesional que la asiste comentar con usted la importancia de Proofreader. El Nurse, children's cuales son los controles que deber seguir o cuando deber Producer, television/film/video.   Si tiene dos  ms Pap anormales:   Le solicitarn que se haga una colposcopa. En esta prueba se observar el cervix  con un microscpico luminoso especial.   Tambin ser necesaria la toma de [place] de tejido (biopsia). Se realiza tomando una pequea [place] de tejido. La muestra es observada en el microscopio para hallar la causa de las clulas anormales. Asegrese de [employer] de su Papanicolau. Si no recibe los [address] de [place], comunquese con el consultorio de su mdico. Recuerde que es su responsabilidad obtener los [place] y Financial risk analyst cuales son los controles que debe seguir.  Document Released: 01/07/2008 Document Revised: 04/02/2011 [facility] Patient Information 2012 [place], [place].

## 2011-07-11 NOTE — Progress Notes (Signed)
  Subjective:    Patient ID: Amy Morrow, female    DOB: 1961-02-15, 50 y.o.   MRN: [id number]  HPI: Here for colpo results  Last Mammogram June,2012  Review of Systems     Objective:   Physical Exam  Colpo CIN 1     Assessment & Plan:  Repeat Pap in six months Flu vaccine given  Dorathy Kinsman 07/11/2011 12:16 PM

## 2011-08-05 DIAGNOSIS — N6320 Unspecified lump in the left breast, unspecified quadrant: Secondary | ICD-10-CM

## 2011-08-05 HISTORY — DX: Unspecified lump in the left breast, unspecified quadrant: N63.20

## 2011-11-05 ENCOUNTER — Ambulatory Visit: Payer: Self-pay | Admitting: Obstetrics and Gynecology

## 2012-01-09 ENCOUNTER — Encounter: Payer: Self-pay | Admitting: Advanced Practice Midwife

## 2012-01-09 ENCOUNTER — Ambulatory Visit (INDEPENDENT_AMBULATORY_CARE_PROVIDER_SITE_OTHER): Payer: Self-pay | Admitting: Advanced Practice Midwife

## 2012-01-09 VITALS — Temp 98.4°F | Ht <= 58 in | Wt 151.1 lb

## 2012-01-09 DIAGNOSIS — IMO0002 Reserved for concepts with insufficient information to code with codable children: Secondary | ICD-10-CM

## 2012-01-09 DIAGNOSIS — R87612 Low grade squamous intraepithelial lesion on cytologic smear of cervix (LGSIL): Secondary | ICD-10-CM

## 2012-01-09 NOTE — Progress Notes (Signed)
  Subjective:    Patient ID: Amy Morrow, female    DOB: 1961/08/03, 51 y.o.   MRN: [id number]  HPI  Patient presents for repeat pap today.  She has a history of LSIL, CIN I.  Last pap was August 2012, followed by colpo November 2012 which showed LSIL.  Patient denies any symptoms of vaginal bleeding, discharge, dysuria, or pelvic pain.    Her family physician is at [facility].   Review of Systems  Per HPI    Objective:   Physical Exam  Genitourinary: There is no rash, tenderness or lesion on the right labia. There is no rash, tenderness or lesion on the left labia. Cervix exhibits no motion tenderness, no discharge and no friability. Right adnexum displays no mass, no tenderness and no fullness. Left adnexum displays no mass, no tenderness and no fullness. Vaginal discharge found.  Scant amount of bleeding at cervix.       Assessment & Plan:  Repeat Pap following LSIL and colpo  Pap results pending F/U in Gyn clinic based on cytology results   Dr Tye Savoy  LEFTWICH-KIRBY, LISA, CNM

## 2012-01-09 NOTE — Assessment & Plan Note (Signed)
Repeated Pap smear today (7 months after colposcopy).  If LSIL is found, patient will need to return for repeat colposcopy.

## 2012-01-15 ENCOUNTER — Telehealth: Payer: Self-pay | Admitting: *Deleted

## 2012-01-15 NOTE — Telephone Encounter (Signed)
Message copied by Mannie Stabile on Thu Jan 15, 2012  9:54 AM ------      Message from: CONSTANT, PEGGY      Created: Thu Jan 15, 2012  9:45 AM      Regarding: LGSIL on pap       Please inform patient of unchanged abnormal pap (not worsening) and need for repeat pap smear in 6 months.            Peggy

## 2012-01-15 NOTE — Telephone Encounter (Signed)
[employer] Spanish Interpreter used [phone]. Called patient and notified of unchanged result of pap smear (note from Dr. Jolayne Panther) and that she'll need a 6 month repeat pap smear. Patient to call us in August to make that appointment per front desk. Patient agrees and states understanding.

## 2012-04-14 ENCOUNTER — Other Ambulatory Visit: Payer: Self-pay | Admitting: Obstetrics & Gynecology

## 2012-04-14 DIAGNOSIS — Z1231 Encounter for screening mammogram for malignant neoplasm of breast: Secondary | ICD-10-CM

## 2012-04-30 ENCOUNTER — Ambulatory Visit (HOSPITAL_COMMUNITY)
Admission: RE | Admit: 2012-04-30 | Discharge: 2012-04-30 | Disposition: A | Discharge status: Home / Self Care | Payer: Self-pay | Source: Ambulatory Visit | Attending: Obstetrics & Gynecology | Admitting: Obstetrics & Gynecology

## 2012-04-30 DIAGNOSIS — Z1231 Encounter for screening mammogram for malignant neoplasm of breast: Secondary | ICD-10-CM

## 2012-05-04 ENCOUNTER — Other Ambulatory Visit: Payer: Self-pay | Admitting: Obstetrics & Gynecology

## 2012-05-04 DIAGNOSIS — R928 Other abnormal and inconclusive findings on diagnostic imaging of breast: Secondary | ICD-10-CM

## 2012-05-24 ENCOUNTER — Encounter (HOSPITAL_COMMUNITY): Payer: Self-pay | Admitting: *Deleted

## 2012-06-01 ENCOUNTER — Ambulatory Visit (HOSPITAL_COMMUNITY): Payer: Self-pay

## 2012-06-07 ENCOUNTER — Encounter (HOSPITAL_COMMUNITY): Payer: Self-pay

## 2012-06-07 ENCOUNTER — Ambulatory Visit (HOSPITAL_COMMUNITY)
Admission: RE | Admit: 2012-06-07 | Discharge: 2012-06-07 | Disposition: A | Discharge status: Home / Self Care | Payer: Self-pay | Source: Ambulatory Visit | Attending: Obstetrics and Gynecology | Admitting: Obstetrics and Gynecology

## 2012-06-07 VITALS — BP 110/72 | Temp 98.8°F | Ht 59.0 in | Wt 147.4 lb

## 2012-06-07 DIAGNOSIS — Z1239 Encounter for other screening for malignant neoplasm of breast: Secondary | ICD-10-CM

## 2012-06-07 HISTORY — DX: Anemia, unspecified: D64.9

## 2012-06-07 NOTE — Progress Notes (Addendum)
Patient referred to [facility] by the [facility] to needing additional imaging of the left breast. Screening mammogram completed at the [facility] 04/30/2012.  Pap Smear:    Pap smear not performed today. Patients last Pap smear was 01/09/2012 at the [facility] and LSIL. Patients last two Pap smears have been LSIL. History of colposcopy June 11, 2011. Patient's follow up is a repeat Pap smear in 6 months from last Pap smear. Patient is scheduled to follow up with [facility] for a repeat Pap smear on Tuesday, July 13, 2012. Pap smear results above is in EPIC.  Physical exam: Breasts Breasts symmetrical. No skin abnormalities bilateral breasts. No nipple retraction bilateral breasts. No nipple discharge bilateral breasts. No lymphadenopathy. No lumps palpated bilateral breasts. Patient referred to the [facility] for left breast Diagnostic Mammogram and possible left breast ultrasound. Appointment scheduled for Tuesday, June 15, 2012 at 1010.    Pelvic/Bimanual No Pap smear completed today since last Pap smear was 01/09/2012. Pap smear not indicated per B[facility]CP guidelines.

## 2012-06-07 NOTE — Patient Instructions (Signed)
Taught patient how to perform BSE and gave educational materials to take home. Patient did not need a Pap smear today due to last Pap smear was 01/09/2012. Patient's last Pap smear was abnormal and follow up is a repeat Pap smear in 6 months. Patient's Pap smear is due in December 2013. Have scheduled an appointment for patient to come back to [facility] for her repeat Pap smear. Appointment scheduled for Tuesday, July 13, 2012. Patient referred to the [facility] for left breast Diagnostic Mammogram and possible left breast ultrasound. Appointment scheduled for Tuesday, June 15, 2012 at 1010. Patient aware of appointments and will be there.  Patient verbalized understanding.

## 2012-06-15 ENCOUNTER — Other Ambulatory Visit: Payer: Self-pay | Admitting: Obstetrics & Gynecology

## 2012-06-15 ENCOUNTER — Ambulatory Visit
Admission: RE | Admit: 2012-06-15 | Discharge: 2012-06-15 | Disposition: A | Discharge status: Home / Self Care | Payer: No Typology Code available for payment source | Source: Ambulatory Visit | Attending: Obstetrics & Gynecology | Admitting: Obstetrics & Gynecology

## 2012-06-15 DIAGNOSIS — R928 Other abnormal and inconclusive findings on diagnostic imaging of breast: Secondary | ICD-10-CM

## 2012-06-15 HISTORY — PX: BREAST BIOPSY: SHX20

## 2012-06-24 ENCOUNTER — Ambulatory Visit (INDEPENDENT_AMBULATORY_CARE_PROVIDER_SITE_OTHER): Payer: Self-pay | Admitting: Family Medicine

## 2012-06-24 ENCOUNTER — Encounter: Payer: Self-pay | Admitting: Family Medicine

## 2012-06-24 VITALS — BP 101/69 | HR 77 | Temp 98.3°F | Ht 59.0 in | Wt 147.0 lb

## 2012-06-24 DIAGNOSIS — Z8739 Personal history of other diseases of the musculoskeletal system and connective tissue: Secondary | ICD-10-CM | POA: Insufficient documentation

## 2012-06-24 DIAGNOSIS — D1801 Hemangioma of skin and subcutaneous tissue: Secondary | ICD-10-CM

## 2012-06-24 DIAGNOSIS — Z87898 Personal history of other specified conditions: Secondary | ICD-10-CM

## 2012-06-24 NOTE — Assessment & Plan Note (Signed)
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

## 2012-06-24 NOTE — Patient Instructions (Signed)
It was nice to meet you.  Please ask the front desk to make an appointment to apply for the orange card.  For your spots on your legs, try wearing compression hose that come up above your knees.  For the burning pain in your legs, after you get the [facility] card, we can make an appointment for you to have a test called Ankle Brachial Index done to test circulation.  Please come back and see me after you get the [facility] card so we can order your cholesterol and other blood work.   Fue Curator. Por favor, pregunte a la recepcin para hacer una cita para solicitar la tarjeta naranja. Para sus manchas en las piernas, intente usar medias de compresin que viene por encima de las rodillas. Para el dolor de ardor en las piernas, despus de obtener la tarjeta de [place], podemos hacer una cita para que usted tenga una prueba llamada ndice tobillo hecho para evaluar la circulacin. Por favor, vuelve a verme despus de recibir la [address] para que podamos ordenar el colesterol y otros anlisis de [place].

## 2012-06-24 NOTE — Progress Notes (Signed)
  Subjective:    Patient ID: Amy Morrow, female    DOB: 10/14/1960, 51 y.o.   MRN: [id number]  HPI  Latroya comes in to establish care. She says she has been told she has high blood pressure and high cholesterol in the past, but she is not taking any medications for them.   Her mammogram was abnormal and she is scheduled for a biopsy next Tuesday.   Her pap has been abnormal for the past two times, and she is seeing GYN in December again for that.   She complains of purple dots in the back of her knees, and also some burning pain that gets worse with walking a long distance.  She denies any chest pain or dyspnea or leg swelling.   Past Medical History  Diagnosis Date  . Abnormal Pap smear   . Hypertension   . Anemia   . Hyperlipidemia    Family History  Problem Relation Age of Onset  . Hypertension Mother   . Hypertension Father    History  Substance Use Topics  . Smoking status: Never Smoker   . Smokeless tobacco: Never Used  . Alcohol Use: No   Review of Systems See HPI    Objective:   Physical Exam BP 101/69  Pulse 77  Temp 98.3 F (36.8 C) (Oral)  Ht 4\' 11"  (1.499 m)  Wt 147 lb (66.679 kg)  BMI 29.69 kg/m2  LMP 04/18/2012 General appearance: alert, cooperative and no distress Lungs: clear to auscultation bilaterally Heart: regular rate and rhythm, S1, S2 normal, no murmur, click, rub or gallop Extremities: extremities normal, atraumatic, no cyanosis or edema.  She has three purple oval shaped lesions on the back of her left knee and one on the right knee.  The range from .5-1.5 cm.  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   Pulses: 2+ and symmetric DP and PT pulses.        Assessment & Plan:

## 2012-06-24 NOTE — Assessment & Plan Note (Signed)
Patient without risk factors for peripheral neuropathy or peripheral vascular disease.  However, will have her do ABI's in Rx clinic.

## 2012-06-29 ENCOUNTER — Other Ambulatory Visit: Payer: Self-pay | Admitting: Obstetrics & Gynecology

## 2012-06-29 ENCOUNTER — Ambulatory Visit
Admission: RE | Admit: 2012-06-29 | Discharge: 2012-06-29 | Disposition: A | Discharge status: Home / Self Care | Payer: No Typology Code available for payment source | Source: Ambulatory Visit | Attending: Obstetrics & Gynecology | Admitting: Obstetrics & Gynecology

## 2012-06-29 ENCOUNTER — Ambulatory Visit
Admission: RE | Admit: 2012-06-29 | Discharge: 2012-06-29 | Disposition: A | Discharge status: Home / Self Care | Payer: Self-pay | Source: Ambulatory Visit | Attending: Obstetrics & Gynecology | Admitting: Obstetrics & Gynecology

## 2012-06-29 DIAGNOSIS — R928 Other abnormal and inconclusive findings on diagnostic imaging of breast: Secondary | ICD-10-CM

## 2012-07-13 ENCOUNTER — Ambulatory Visit (HOSPITAL_COMMUNITY)
Admission: RE | Admit: 2012-07-13 | Discharge: 2012-07-13 | Disposition: A | Discharge status: Home / Self Care | Payer: Self-pay | Source: Ambulatory Visit | Attending: Obstetrics and Gynecology | Admitting: Obstetrics and Gynecology

## 2012-07-13 ENCOUNTER — Encounter (HOSPITAL_COMMUNITY): Payer: Self-pay

## 2012-07-13 VITALS — BP 100/68 | Temp 98.8°F | Ht 59.0 in | Wt 147.8 lb

## 2012-07-13 DIAGNOSIS — IMO0002 Reserved for concepts with insufficient information to code with codable children: Secondary | ICD-10-CM

## 2012-07-13 DIAGNOSIS — Z1239 Encounter for other screening for malignant neoplasm of breast: Secondary | ICD-10-CM

## 2012-07-13 NOTE — Progress Notes (Signed)
Patient came to [facility] today for a 6 month repeat Pap smear. No complaints today.  Pap Smear:    Completed Pap smear today. Patients last Pap smear was 01/09/2012 at the [facility] and LSIL. Patients last two Pap smears have been LSIL. History of colposcopy June 11, 2011. Pap smear results above is in EPIC.       Pelvic/Bimanual  Ext Genitalia No lesions, no swelling and no discharge observed on external genitalia.         Vagina Vagina pink and normal texture. No lesions and small amount of blood observed in vagina.          Cervix Cervix is present. Cervix pink and of normal texture. Cervical polyp observed on cervical os. Blood observed on cervical os. Patient referred to the [facility] for follow up on cervical polyp and post menopausal bleeding. Appointment scheduled for Thursday, July 29, 2012 at 1315.       Uterus Uterus is present and palpable. Uterus in normal position and normal size.       Adnexae Bilateral ovaries present and palpable. No tenderness on palpation.        Rectovaginal No rectal exam completed today since patient had no rectal complaints. Small hemorrhoid observed on rectal area.  Patient initially came to [facility] 06/07/2012.

## 2012-07-13 NOTE — Patient Instructions (Signed)
Discussed with patient how to perform BSE. Patient referred to the [facility] for follow up on cervical polyp and post menopausal bleeding. Appointment scheduled for Thursday, July 29, 2012 at 1315. Patient aware of appointment and will be there. Let patient know will follow up with her within the next couple weeks with results by letter or phone. Let patient know her next Pap smear will be due based on result of today's Pap smear. Patient verbalized understanding.

## 2012-07-13 NOTE — Addendum Note (Signed)
Encounter addended by: Saintclair Halsted, RN on: 07/13/2012 10:54 AM<BR>     Documentation filed: Visit Diagnoses, Notes Section

## 2012-07-29 ENCOUNTER — Ambulatory Visit (INDEPENDENT_AMBULATORY_CARE_PROVIDER_SITE_OTHER): Payer: Self-pay | Admitting: Family Medicine

## 2012-07-29 ENCOUNTER — Encounter: Payer: Self-pay | Admitting: Family Medicine

## 2012-07-29 VITALS — BP 113/80 | HR 88 | Temp 99.8°F | Ht 59.0 in | Wt 146.4 lb

## 2012-07-29 DIAGNOSIS — N841 Polyp of cervix uteri: Secondary | ICD-10-CM

## 2012-07-29 DIAGNOSIS — C539 Malignant neoplasm of cervix uteri, unspecified: Secondary | ICD-10-CM | POA: Insufficient documentation

## 2012-07-29 DIAGNOSIS — N951 Menopausal and female climacteric states: Secondary | ICD-10-CM

## 2012-07-29 DIAGNOSIS — N870 Mild cervical dysplasia: Secondary | ICD-10-CM

## 2012-07-29 NOTE — Assessment & Plan Note (Signed)
Nml last pap.

## 2012-07-29 NOTE — Progress Notes (Signed)
Patient referred here from [facility] for postmenopausal bleeding and cervical polyp found during pap screening.  Patient states she has not gone thru menopause and is still having irregular periods, last period 07/18/12, before that beginning of October. States has had a cold and maybe a fever.

## 2012-07-29 NOTE — Assessment & Plan Note (Signed)
Must have passed with heavy cycle.

## 2012-07-29 NOTE — Assessment & Plan Note (Signed)
If she has another heavy cycle, consider EMB.  Discussed with pt. At length.  Interpreter Dori used.

## 2012-07-29 NOTE — Patient Instructions (Addendum)
  Menorragia (Menorrhagia) La hemorragia uterina (sangrado del tero) disfuncional es diferente del perodo menstrual normal. Cuando los perodos son irregulares o hay ms hemorragia que lo habitual en usted, se denomina menorragia. La causa puede ser un desequilibrio hormonal, fsico o metablico u otros problemas. Es necesario Medical sales representative examen para que el profesional pueda tratar [employer] causas que son tratables. Si el problema contina, ser necesario realizar una dilatacin y curetaje. Este procedimiento [employer] en dilatar el cuello del tero (la apertura del tero o matriz), es decir, se lo estira para Technical brewer, y se raspa la superficie que tapiza el interior del tero. Un especialista (patlogo) examina en el microscopio el tejido que se retira para asegurarse que no haya nada preocupante que requiera un examen ms exhaustivo. INSTRUCCIONES PARA EL CUIDADO DOMICILIARIO  Si el profesional que la asiste le prescribe medicamentos, tmelos tal como se le indic. No cambie ni reemplace medicamentos sin consultarlo con Mining engineer.  Las hemorragias de larga duracin pueden tener como consecuencia un dficit de hierro. El profesional que la asiste podr prescribirle comprimidos de hierro. Esto ayuda a Scientific laboratory technician que el organismo pierde luego de una hemorragia abundante. Tome los medicamentos tal como se le indic. El hierro puede causarle estreimiento. Si esto es un problema, aumente el consumo de [place], frutas y [place].  No tome aspirina o medicamentos que la contengan desde una semana antes del perodo menstrual ni durante el mismo. La aspirina puede hacer que la hemorragia empeore.  Si necesita cambiar el apsito o el tampn mas de una vez cada 2 horas, permanezca en cama y descanse todo lo posible hasta que la hemorragia se detenga.  Consuma alimentos balanceados. Coma alimentos ricos en hierro. Por ejemplo, verduras de [employer], carne, hgado, huevos y panes y Medical laboratory scientific officer de  grano integral. No trate de perder peso hasta que la hemorragia anormal se detenga y los niveles de hierro en la sangre vuelvan a la normalidad. SOLICITE ATENCIN MDICA SI:  Debe cambiar el apsito o el tampn ms de una vez cada hora.  Si tiene nuseas (ganas de vomitar), vmitos, mareos o diarrea mientras toma los medicamentos.  Tiene algn problema que pueda relacionarse con el medicamento que est tomando. SOLICITE ATENCIN MDICA DE INMEDIATO SI:  Tiene fiebre.  Siente escalofros.  Sufre una hemorragia intensa o comienza a eliminar cogulos de [place].  Se siente mareado o sufre un desmayo. EST SEGURO QUE:   Comprende las instrucciones para el alta mdica.  Controlar su enfermedad.  Solicitar atencin mdica de inmediato segn las indicaciones. Document Released: 04/30/2005 Document Revised: 10/13/2011 [facility] Patient Information 2013 [place], [place].

## 2012-07-29 NOTE — Progress Notes (Signed)
  Subjective:    Patient ID: Amy Morrow, female    DOB: 04/25/1961, 51 y.o.   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  She reports continued cycles, q 2mos.  Not menopausal.  Last cycle was heavy of unclear etiology.  Other cycles have been normal.  Review of Systems  Constitutional: Negative for appetite change.  HENT: Negative for rhinorrhea and sneezing.   Respiratory: Negative for choking and shortness of breath.   Cardiovascular: Negative for leg swelling.  Gastrointestinal: Negative for vomiting, abdominal pain, diarrhea and constipation.  Genitourinary: Positive for vaginal bleeding. Negative for vaginal discharge and pelvic pain.       Objective:   Physical Exam  Vitals reviewed. Constitutional: She is oriented to person, place, and time. She appears well-developed and well-nourished.  HENT:  Head: Normocephalic and atraumatic.  Eyes: No scleral icterus.  Neck: Neck supple.  Cardiovascular: Normal rate and regular rhythm.   Pulmonary/Chest: Effort normal.  Abdominal: Soft.  Genitourinary: Vagina normal. No breast tenderness.       Cervix with large nabothian cysts with vascular markings.  No evidence of polyp noted.  Neurological: She is alert and oriented to person, place, and time.  Skin: Skin is warm and dry.          Assessment & Plan:

## 2012-08-05 NOTE — Addendum Note (Signed)
Encounter addended by: Saintclair Halsted, RN on: 08/05/2012  4:05 PM<BR>     Documentation filed: Visit Diagnoses, Charges VN

## 2013-01-21 ENCOUNTER — Ambulatory Visit (INDEPENDENT_AMBULATORY_CARE_PROVIDER_SITE_OTHER): Payer: No Typology Code available for payment source | Admitting: Obstetrics & Gynecology

## 2013-01-21 ENCOUNTER — Encounter: Payer: Self-pay | Admitting: Obstetrics & Gynecology

## 2013-01-21 VITALS — BP 111/78 | HR 83 | Temp 97.7°F | Ht 59.0 in | Wt 144.0 lb

## 2013-01-21 DIAGNOSIS — IMO0002 Reserved for concepts with insufficient information to code with codable children: Secondary | ICD-10-CM

## 2013-01-21 DIAGNOSIS — R6889 Other general symptoms and signs: Secondary | ICD-10-CM

## 2013-01-21 NOTE — Progress Notes (Signed)
Subjective:     Patient ID: Amy Morrow, female   DOB: [birth year], 52 y.o.   MRN: [id number]  HPI Pt told to f/u every 6 months for pap.  Had LGSIL in [place] 01/09/2012.  Had normal PAP 07/13/2012 but, did have an HPV done. Pt with no GYN complaints.   Review of Systems     Objective:   Physical Exam BP 111/78  Pulse 83  Temp(Src) 97.7 F (36.5 C) (Oral)  Ht 4\' 11"  (1.499 m)  Wt 144 lb (65.318 kg)  BMI 29.07 kg/m2  LMP 11/16/2012  GU: EGBUS: no lesions Vagina: no blood in vault Cervix: no lesion; no mucopurulent d/c; small polyp noted at os- unable to remove with Ringed forceps as it was too small.  Treated with silver nitrate.      Assessment:     F/u of abnormal PAP     Plan:     PAP with HPV  F/u 1 year or sooner prn abnormal PAP

## 2013-01-21 NOTE — Patient Instructions (Signed)
Abnormal Pap Test Information During a Pap test, the cells on the surface of your cervix are checked to see if they look normal, abnormal, or if they show signs of having been altered by a certain type of virus called human papillomavirus, or HPV. Cervical cells that have been affected by HPV are called dysplasia. Dysplasia is not cancer, but describes abnormal cells found on the surface of the cervix. Depending on the degree of dysplasia, some of the cells may be considered pre-cancerous and may turn into cancer over time if follow up with a caregiver is delayed.  WHAT DOES AN ABNORMAL PAP TEST MEAN? Having an abnormal pap test does not mean that you have cancer. However, certain types of abnormal pap tests can be a sign that a person is at a higher risk of developing cancer. Your caregiver will want to do other tests to find out more about the abnormal cells. Your abnormal Pap test results could show:   Small and uncertain changes that should be carefully watched.   Cervical dysplasia that has caused mild changes and can be followed over time.  Cervical dysplasia that is more severe and needs to be followed and treated to ensure the problem goes away.  Cancer.  When severe cervical dysplasia is found and treated early, it rarely will grow into cancer.  WHAT WILL BE DONE ABOUT MY ABNORMAL PAP TEST?  A colposcopy may be needed. This is a procedure where your cervix is examined using light and magnification.  A small tissue sample of your cervix (biopsy) may need to be removed and then examined. This is often performed if there are areas that appear infected.  A sample of cells from the cervical canal may be removed with either a small brush or scraping instrument (curette). Based on the results of the procedures above, some caregivers may recommend either cryotherapy of the cervix or a surgical LEEP where a portion of the cervix is removed. LEEP is short for "loop electrical excisional  procedure." Rarely, a caregiver may recommend a cone biopsy.This is a procedure where a small, cone-shaped sample of your cervix is taken out. The part that is taken out is the area where the abnormal cells are.  WHAT IF I HAVE A DYSPLASIA OR A CANCER? You may be referred to a specialist. Radiation may also be a treatment for more advanced cancer. Having a hysterectomy is the last treatment option for dysplasia, but it is a more common treatment for someone with cancer. All treatment options will be discussed with you by your caregiver. WHAT SHOULD YOU DO AFTER BEING TREATED? If you have had an abnormal pap test, you should continue to have regular pap tests and check-ups as directed by your caregiver. Your cervical problem will be carefully watched so it does not get worse. Also, your caregiver can watch for, and treat, any new problems that may come up. Document Released: 11/05/2010 Document Revised: 10/13/2011 Document Reviewed: 07/17/2011 [employer] Patient Information 2014 [employer].  

## 2013-01-25 ENCOUNTER — Ambulatory Visit (INDEPENDENT_AMBULATORY_CARE_PROVIDER_SITE_OTHER): Payer: No Typology Code available for payment source | Admitting: Family Medicine

## 2013-01-25 ENCOUNTER — Encounter: Payer: Self-pay | Admitting: Family Medicine

## 2013-01-25 VITALS — BP 109/74 | HR 96 | Temp 98.5°F | Ht 59.0 in | Wt 143.0 lb

## 2013-01-25 DIAGNOSIS — Z8739 Personal history of other diseases of the musculoskeletal system and connective tissue: Secondary | ICD-10-CM

## 2013-01-25 DIAGNOSIS — Z87898 Personal history of other specified conditions: Secondary | ICD-10-CM

## 2013-01-25 DIAGNOSIS — D1801 Hemangioma of skin and subcutaneous tissue: Secondary | ICD-10-CM

## 2013-01-25 DIAGNOSIS — R300 Dysuria: Secondary | ICD-10-CM | POA: Insufficient documentation

## 2013-01-25 LAB — POCT UA - MICROSCOPIC ONLY: RBC, urine, microscopic: >20

## 2013-01-25 LAB — POCT URINALYSIS DIPSTICK
Bilirubin, UA: NEGATIVE
Glucose, UA: NEGATIVE
Ketones, UA: NEGATIVE
Nitrite, UA: NEGATIVE
Spec Grav, UA: 1.025
pH, UA: 6

## 2013-01-25 MED ORDER — CEPHALEXIN 500 MG PO CAPS: 500.0000 mg | ORAL_CAPSULE | Freq: Two times a day (BID) | ORAL | Status: DC

## 2013-01-25 MED ORDER — TRIAMCINOLONE ACETONIDE 0.5 % EX OINT: TOPICAL_OINTMENT | Freq: Two times a day (BID) | CUTANEOUS | Status: DC

## 2013-01-25 NOTE — Assessment & Plan Note (Signed)
Will obtain ABI's.

## 2013-01-25 NOTE — Assessment & Plan Note (Signed)
UA concerning for UTI, Rx for Keflex 500 PO BID x 1 week.

## 2013-01-25 NOTE — Assessment & Plan Note (Signed)
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  Will try steroid ointment since they itch.  Reassured, but will monitor, consider biopsy if they increase in size.

## 2013-01-25 NOTE — Progress Notes (Signed)
  Subjective:    Patient ID: Amy Morrow, female    DOB: [birth year], 52 y.o.   MRN: [id number]  HPI:  Amy Morrow comes in with several complaints:   Spots on back of knee: have been there for 3 years but have gotten bigger, itch but do not hurt.  Has tried lotion on them without improvement.   Pain in legs/feet: Has burning pain, worse with standing for long time or walking, improves with rest.  Has not tried medications.   Pain with urination: since yesterday, endorses chills, no fevers, hurts every time she goes, has had poor appetiete.   Past Medical History  Diagnosis Date  . Abnormal Pap smear   . Hypertension   . Anemia   . Hyperlipidemia   . Polyp of cervix     History  Substance Use Topics  . Smoking status: Never Smoker   . Smokeless tobacco: Never Used  . Alcohol Use: No    Family History  Problem Relation Age of Onset  . Hypertension Mother   . Hypertension Father      ROS Pertinent items in HPI    Objective:  Physical Exam:  BP 109/74  Pulse 96  Temp(Src) 98.5 F (36.9 C) (Oral)  Ht 4\' 11"  (1.499 m)  Wt 143 lb (64.864 kg)  BMI 28.87 kg/m2  LMP 11/16/2012 General appearance: alert, cooperative and no distress Head: Normocephalic, without obvious abnormality, atraumatic Abd: +BS, soft, non-tender Legs: on back of L knee there are 3 hyperpigmented/hypertrophic purple areas, slightly raised, one on back of R knee.  These measure about 1 cm x.5 cm Normal sensation and reflexes of LE Pulses: 2+ and symmetric       Assessment & Plan:

## 2013-01-25 NOTE — Patient Instructions (Signed)
I want you to take keflex, an antibiotic for your urinary tract infection twice a day for a week.   Please put the steroid cream on the spots on the back of your leg twice a day.   Ask the front desk to make you an appointment in pharmacy clinic to see Dr. Raymondo Band for ABI's, this is a test to check your circulation.   Quiero que tomes Keflex, un antibitico para la infeccin del tracto [employer] veces al da durante una semana.   Por favor, ponga la crema con esteroides en las manchas en la parte posterior de la pierna dos veces al da.   Preguntar a la recepcin para que se cita en la clnica de la farmacia para ver al Dr. Raymondo Band de ABI, esta es una prueba para comprobar su circulacin.

## 2013-02-07 ENCOUNTER — Ambulatory Visit: Payer: No Typology Code available for payment source | Admitting: Pharmacist

## 2013-02-10 ENCOUNTER — Ambulatory Visit: Payer: No Typology Code available for payment source | Admitting: Pharmacist

## 2013-02-18 ENCOUNTER — Ambulatory Visit (INDEPENDENT_AMBULATORY_CARE_PROVIDER_SITE_OTHER): Payer: No Typology Code available for payment source | Admitting: Pharmacist

## 2013-02-18 VITALS — Ht 59.0 in | Wt 142.7 lb

## 2013-02-18 DIAGNOSIS — Z87898 Personal history of other specified conditions: Secondary | ICD-10-CM

## 2013-02-18 DIAGNOSIS — Z8739 Personal history of other diseases of the musculoskeletal system and connective tissue: Secondary | ICD-10-CM

## 2013-02-18 NOTE — Progress Notes (Signed)
S:  Patient arrives in good condition.  She is able to communicate modestly and preferred an interpreter.  She allowed [place] to conduct the physical evaluation and then when the interpreter, tori, arrived we completed the detailed interview.   Pain in b/l lower legs from the knees down to the end of the toes burning starts about after walking. Sitting down makes the pain better. Pain worsens with increased pace and walking up hills. Associated Symptoms: Sheets on her feet make her legs hurt; which has been occuring for the past 51yrs.  "No pain" at rest. Massaging her legs/feet with lotion and crossing her legs helps the pain.   Lower extremity Physical Exam includes no obvious deformity or wounds. Bilaterally.   ABI overall = 1.17. Right Arm 118 mmHg    Left Arm 116 mmHg Right ankle posterior tibial 138 mmHg     dorsalis pedis 118 mmHg Left ankle posterior tibial 138 mmHg    dorsalis pedis 118 mmHg  Normal ABI and low likelihood of PAD based on ABI of 1.17 in a patient with symptoms of burning pain in her lower extremities from he knees to the tips of her toes.  Patient states pain prevents her from sleeping and requested pain relieving medication. No change in medications were made at visit today. Results reviewed and written information provided.   F/U Clinic Visit with Dr. Birdie Sons at earliest convenience for additional work-up and management.  Total time in face-to-face counseling .  Patient seen with Wenda Low, MD Resident

## 2013-02-18 NOTE — Assessment & Plan Note (Signed)
Normal ABI and low likelihood of PAD based on ABI of 1.17 in a patient with symptoms of burning pain in her lower extremities from he knees to the tips of her toes.  Patient states pain prevents her from sleeping and requested pain relieving medication. No change in medications were made at visit today. Results reviewed and written information provided.   F/U Clinic Visit with Dr. Birdie Sons at earliest convenience for additional work-up and management.  Total time in face-to-face counseling .  Patient seen with Wenda Low, MD Resident

## 2013-02-18 NOTE — Patient Instructions (Addendum)
1) You have normal blood flow in your legs  2) Please follow-up with you doctor to discuss future treatment options.  Schedule on the way out.     1) tienes sangre normal flujo en las piernas  2) por favor seguimiento mdico para discutir las opciones de tratamiento futuras. Horario de salida.

## 2013-02-23 ENCOUNTER — Encounter: Payer: Self-pay | Admitting: Pharmacist

## 2013-02-23 NOTE — Progress Notes (Signed)
Patient ID: Amy Morrow, female   DOB: 1961-05-20, 52 y.o.   MRN: [id number] Reviewed: Agree with Dr. Macky Lower documentation and management.

## 2013-03-01 ENCOUNTER — Encounter: Payer: Self-pay | Admitting: Family Medicine

## 2013-03-01 ENCOUNTER — Ambulatory Visit (INDEPENDENT_AMBULATORY_CARE_PROVIDER_SITE_OTHER): Payer: No Typology Code available for payment source | Admitting: Family Medicine

## 2013-03-01 VITALS — BP 113/75 | HR 75 | Ht 59.0 in | Wt 140.6 lb

## 2013-03-01 DIAGNOSIS — Z8739 Personal history of other diseases of the musculoskeletal system and connective tissue: Secondary | ICD-10-CM

## 2013-03-01 DIAGNOSIS — D1801 Hemangioma of skin and subcutaneous tissue: Secondary | ICD-10-CM

## 2013-03-01 DIAGNOSIS — Z87898 Personal history of other specified conditions: Secondary | ICD-10-CM

## 2013-03-01 LAB — VITAMIN B12: Vitamin B-12: 457 pg/mL (ref 211–911)

## 2013-03-01 LAB — GLUCOSE, CAPILLARY: Glucose-Capillary: 96 mg/dL (ref 70–99)

## 2013-03-01 MED ORDER — GABAPENTIN 100 MG PO CAPS: 100.0000 mg | ORAL_CAPSULE | Freq: Three times a day (TID) | ORAL | Status: DC

## 2013-03-01 NOTE — Patient Instructions (Addendum)
Nice to meet you. We are going to check some labs today to see if we can figure out why you are having this burning. I have prescribed a medication to help with this burning pain. We will have you come back in a week for a biopsy of the lesions on the back of your legs.

## 2013-03-01 NOTE — Progress Notes (Signed)
Patient ID: Amy Morrow, female   DOB: 03/22/1961, 52 y.o.   MRN: [id number] Interpretor was used for this encounter.  HPI Patient presents with pain and a burning sensation in the soles of her feet bilaterally as well as pain in her shins and calves bilaterally. She has had these symptoms for 6-7 years and have increased in intensity. She feels the pain and burning sensation at all times, even at night, which makes her feel restless and have trouble sleeping. She also notes that at around the same time, she noticed a small skin lesion in her left popliteal fossa that was pinkish and raised, and over the years, there have been more to appear and they all have become dark brown, raised, and are itchy. Some cream has helped with the itch in the past, but nothing has worked to remove them. She has not had any biopsies of the lesions, either.     Review of Systems: see HPI  Objective:   Physical Exam HEENT: PERRLA, EOMI. NCAT.  Extrem: No edema. No pain to palpation.   MSK: Strength 5/5 throughout, no tenderness to palpation.  NEURO: Sensation to light tough intact in UE/LE b/l. CN2-12 grossly intact.  Skin: patient with 3 skin lesions in the right popliteal fossa and 2 in the left. These are elliptoid in shape. Are dark appearing. Rough and scaly. Non-tender BP 113/75  Pulse 75  Ht 4\' 11"  (1.499 m)  Wt 140 lb 9.6 oz (63.776 kg)  BMI 28.38 kg/m2  LMP 11/16/2012  See individual problems for A/P.

## 2013-03-01 NOTE — Progress Notes (Deleted)
Subjective:     Patient ID: Amy Morrow, female   DOB: 1960-12-20, 52 y.o.   MRN: [id number]  HPI Patient presents with pain and a burning sensation in the soles of her feet bilaterally as well as pain in her shins and calves bilaterally. She has had these symptoms for 6-7 years and have increased in intensity. She feels the pain and burning sensation at all times, even at night, which makes her feel restless and have trouble sleeping. She also notes that at around the same time, she noticed a small skin lesion in her left popliteal fossa that was pinkish and raised, and over the years, there have been more to appear and they all have become dark brown, raised, and are itchy. Some cream has helped with the itch in the past, but nothing has worked to remove them. She has not had any biopsies of the lesions, either.     Review of Systems   HEENT: Denies HA, dizziness, changes in vision.  CV: Denies chest pain or SOB. PULM: Denies cough, hemoptysis.  GI: Denies D/C/N/V, denies hematochezia, melena GU: Denies Hematuria, dysuria.  Extremities: Admits to burning sensation in feet and pain and tingling in lower legs bilaterally.  Skin: Admits to lesions in popliteal fossa bilaterally. Denies any other rashes or lesions.  Neuro: Denies N/B/T in upper extremities, admits to paresthesias and burning sensation in lower extremities and feet.   Objective:   Physical Exam HEENT: PEARLA, EOMI. NCAT.  CV: RRR, no m/r/g PULM: CTAB, no wheezes, rales, ronchi.  ABD: S/NT/ND, NBSx4. No masses.  Extrem: No femoral bruits to auscultation. Peripheral pulses 2+. No cyanosis, clubbing, edema. No pain to palpation.   MSK: Strength 5/5 UE/LE b/l at all sites.  NEURO: Sensation to light tough intact in UE/LE b/l. CN2-12 grossly intact.     Assessment:   1. Peripheral paresthesias.  2. Macular skin lesions.     Plan:     1. Peripheral paresthesias: Labs for Vit B12, CBG to rule out vitamin deficiency and  diabetes as a cause for peripheral neuropathy.   2. Macular skin lesions: 4-5 on left popliteal fossa, 2 on right popliteal fossa, dark brown and raised. Continue previously prescribed triamcinolone ointment for next week as needed, and return one week for a biopsy of the lesion.

## 2013-03-01 NOTE — Assessment & Plan Note (Signed)
Unsure of cause of this pain. Has had negative ABI. Will check B12 and DM with glucose. Will start on gabapentin 100 mg TID. Can increase as tolerated.

## 2013-03-01 NOTE — Assessment & Plan Note (Signed)
Steroid cream has not helped. Will plan to see back in one week for biopsy.

## 2013-03-04 ENCOUNTER — Encounter: Payer: Self-pay | Admitting: *Deleted

## 2013-03-07 ENCOUNTER — Other Ambulatory Visit: Payer: Self-pay | Admitting: Family Medicine

## 2013-03-07 DIAGNOSIS — Z1231 Encounter for screening mammogram for malignant neoplasm of breast: Secondary | ICD-10-CM

## 2013-03-10 ENCOUNTER — Encounter: Payer: Self-pay | Admitting: Family Medicine

## 2013-03-10 ENCOUNTER — Ambulatory Visit (INDEPENDENT_AMBULATORY_CARE_PROVIDER_SITE_OTHER): Payer: No Typology Code available for payment source | Admitting: Family Medicine

## 2013-03-10 VITALS — BP 107/69 | HR 82 | Temp 98.7°F | Ht 59.0 in | Wt 139.0 lb

## 2013-03-10 DIAGNOSIS — Z8739 Personal history of other diseases of the musculoskeletal system and connective tissue: Secondary | ICD-10-CM

## 2013-03-10 DIAGNOSIS — L989 Disorder of the skin and subcutaneous tissue, unspecified: Secondary | ICD-10-CM

## 2013-03-10 DIAGNOSIS — Z87898 Personal history of other specified conditions: Secondary | ICD-10-CM

## 2013-03-10 DIAGNOSIS — R21 Rash and other nonspecific skin eruption: Secondary | ICD-10-CM

## 2013-03-10 MED ORDER — GABAPENTIN 100 MG PO CAPS: 200.0000 mg | ORAL_CAPSULE | Freq: Three times a day (TID) | ORAL | Status: DC

## 2013-03-10 NOTE — Progress Notes (Signed)
Subjective:     Patient ID: Amy Morrow, female   DOB: 1961-01-26, 52 y.o.   MRN: [id number]  HPI Pt here for follow up for burning sensation in her legs and for lesions in b/l popliteal fossae.   Burning pain in legs The gabapentin 100mg  TID has been starting to help a little bit, but she only has been on it for 4 days. It has not made her overly sleepy and she would be ok with increasing the dose. The pain continues to be below the knees and intensifies distally and is most intense on the soles of her feet. It is worse with long periods of standing, which she does during her work. Tennis shoes help slightly.   Skin lesions Patient still complains of itching and is fine with US obtaining a biopsy today. The triamcinolone cream is not helping the itching.    Review of Systems Extremities: admits burning sensation in LE b/l below knee, intensifying in soles. No cyanosis, clubbing or edema.  Skin: Admits to pruritic lesions behind her knees bilaterally.  Remainder of ROS is negative.     Objective:   Physical Exam     CV: RRR, no m/r/g Pulm: CTAB Extremities: admits burning sensation in LE b/l below knee, intensifying in soles. No cyanosis, clubbing or edema.  Vascular: pulses 2+ globally Skin: 3 oblong slightly raised scaly lesions in left pop fossa and 1 in right pop fossa. No other legions or rashes anywhere else on body.   Assessment:     1. Skin lesions, NOS 2. Peripheral neuropathy, NOS     Plan:     1. Skin lesions: punch biopsy taken today using lidocaine/epi local anesthetic. Patient made aware of all risks of performing and not performing the procedure. Will have patient follow up with [place] in 1-2 weeks after results of biopsy return.  2. Peripheral neuropathy, NOS: some improvement on gabapentin. Increase dose to 200 mg PO TID.       PGY2 addendum: I have seen and examined this patient with the medical student and agree with the above note.  Briefly, patient is a  52 yo female who presents in f/u for burning sensation in her legs and skin lesions in popliteal fossa.  Burning sensation: has been on gabapentin 100 mg TID. Has been helping a little bit though has only been on it for 4 days. Does not make her sleepy. Burning is mostly in the bottoms of her feet, though also occurs up her legs to her knee. Worse with standing.  Skin lesions: in popliteal fossa. History outlined in previous visits note. [place] note helping. Is ok doing biopsy today.  PE: Gen: NAD Skin: left popliteal fossa with 3 elliptical hyperpigmented scaly 1-2 cm raised lesions, right popliteal fossa with 1 of these lesions Ext: bilateral LE with sensation intact, no edema BP 107/69  Pulse 82  Temp(Src) 98.7 F (37.1 C) (Oral)  Ht 4\' 11"  (1.499 m)  Wt 139 lb (63.05 kg)  BMI 28.06 kg/m2  LMP 11/16/2012  Skin biopsy: the punch biopsy procedure was described to the patient in detail, informed consent was obtained. The skin of the left popliteal fossa was cleaned with alcohol swab. Lidocaine/epi mixture was used to numb the skin. The area on the left popliteal fossa middle lesion superomedial portion was identified and the left popliteal fossa was cleaned with betadyne. A 4 mm punch biopsy was then made. Silver nitrate was used for hemostasis and antibiotic ointment and bandage was  placed.  A/P: please see individual problems for A/P  Marikay Alar, MD PGY2 [facility]

## 2013-03-10 NOTE — Patient Instructions (Addendum)
Nice to see you again. We will call with the results of the biopsy. If the area starts to bleed please apply pressure. If it does not stop bleeding please let us know. If the area becomes red and painful please let us know. I have increased your neurontin to 200 mg TID. This can make you drowsy so be cautious with this.

## 2013-03-10 NOTE — Progress Notes (Signed)
Interpreter Josph [place] Namihira for Dr Sonnenberg 

## 2013-03-11 DIAGNOSIS — L439 Lichen planus, unspecified: Secondary | ICD-10-CM | POA: Insufficient documentation

## 2013-03-11 NOTE — Assessment & Plan Note (Signed)
Biopsy taken. Will have return in 1-2 weeks to discuss results.

## 2013-03-11 NOTE — Assessment & Plan Note (Signed)
Slightly improved with neurontin 100 TID. Will increase to 200 mg TID.

## 2013-03-23 ENCOUNTER — Other Ambulatory Visit: Payer: Self-pay | Admitting: Family Medicine

## 2013-03-23 ENCOUNTER — Telehealth: Payer: Self-pay | Admitting: Family Medicine

## 2013-03-23 MED ORDER — CLOBETASOL PROPIONATE 0.05 % EX CREA: TOPICAL_CREAM | Freq: Two times a day (BID) | CUTANEOUS | Status: DC

## 2013-03-23 NOTE — Telephone Encounter (Signed)
Pt is aware about new medication.  [place]

## 2013-03-23 NOTE — Progress Notes (Signed)
Patient with biopsy indicating lichen planus. Will treat with clobetasol cream for 3 weeks and have patient f/u for further evaluation.

## 2013-03-31 ENCOUNTER — Ambulatory Visit (HOSPITAL_COMMUNITY): Payer: No Typology Code available for payment source

## 2013-04-05 ENCOUNTER — Ambulatory Visit (INDEPENDENT_AMBULATORY_CARE_PROVIDER_SITE_OTHER): Payer: No Typology Code available for payment source | Admitting: Family Medicine

## 2013-04-05 ENCOUNTER — Encounter: Payer: Self-pay | Admitting: Family Medicine

## 2013-04-05 VITALS — BP 116/77 | HR 82 | Temp 98.4°F | Wt 143.0 lb

## 2013-04-05 DIAGNOSIS — Z87898 Personal history of other specified conditions: Secondary | ICD-10-CM

## 2013-04-05 DIAGNOSIS — L439 Lichen planus, unspecified: Secondary | ICD-10-CM

## 2013-04-05 DIAGNOSIS — Z8739 Personal history of other diseases of the musculoskeletal system and connective tissue: Secondary | ICD-10-CM

## 2013-04-05 MED ORDER — GABAPENTIN 100 MG PO CAPS: 200.0000 mg | ORAL_CAPSULE | Freq: Three times a day (TID) | ORAL | Status: DC

## 2013-04-05 MED ORDER — BACITRACIN 500 UNIT/GM EX OINT: 1.0000 "application " | TOPICAL_OINTMENT | Freq: Two times a day (BID) | CUTANEOUS | Status: DC

## 2013-04-05 MED ORDER — CLOBETASOL PROPIONATE 0.05 % EX CREA: TOPICAL_CREAM | Freq: Every day | CUTANEOUS | Status: DC

## 2013-04-05 NOTE — Progress Notes (Signed)
  Subjective:    Patient ID: Amy Morrow, female    DOB: 1961/07/12, 52 y.o.   MRN: [id number]  HPI Patient is a 52 yo female who presents for f/u of skin lesions and burning sensation in legs.  Lichen planus: biopsy proven. Has been using clobetasol cream. Itching has improved. Lesions are getting better. Question of whether biopsy site was infected since there is a hardness there.  Neuropathy: improved. Taking gabapentin 100 mg TID. Feels this has helped. No drowsiness with this.  Review of Systems see HPI     Objective:   Physical Exam  Constitutional: She appears well-developed and well-nourished.  HENT:  Head: Normocephalic and atraumatic.  Cardiovascular:  2+ DP pulses  Neurological:  Sensation to light touch intact in bilateral LE  Skin:  left popliteal fossa with 3 elliptical hyperpigmented ~1 cm  lesions, right popliteal fossa with 1 of these lesions, less dark and raised than previously, no apparent scale to them, biopsy site with scab in place and minimal erythema, no fluctuance  BP 116/77  Pulse 82  Temp(Src) 98.4 F (36.9 C) (Oral)  Wt 143 lb (64.864 kg)  BMI 28.87 kg/m2  LMP 11/16/2012     Assessment & Plan:

## 2013-04-05 NOTE — Patient Instructions (Signed)
Nice to see you again. I'm glad your rash and burning are doing better. Please use the antibiotic ointment for the biopsy site.  Please only use the clobetasol cream 5 days out of the week. Please increase the gabapentin to 2 pills 3 times a day. If this makes you sleepy go back to the 1 pill three times a day.

## 2013-04-05 NOTE — Assessment & Plan Note (Signed)
Improved on gabapentin 100 mg TID, though still with some burning sensation. Will increase gabapentin to 200 mg TID. If gets drowsy with this she is to decrease back to 100 mg TID.

## 2013-04-05 NOTE — Assessment & Plan Note (Signed)
Biopsy revealed lichen planus. Treating with clobetasol cream. Improved significantly. Will decrease usage to once daily 5 days a week. Will continue to follow.

## 2013-04-07 ENCOUNTER — Ambulatory Visit (HOSPITAL_COMMUNITY): Admission: RE | Admit: 2013-04-07 | Payer: No Typology Code available for payment source | Source: Ambulatory Visit

## 2013-05-02 ENCOUNTER — Ambulatory Visit (HOSPITAL_COMMUNITY): Payer: Self-pay

## 2013-05-19 ENCOUNTER — Ambulatory Visit (HOSPITAL_COMMUNITY): Payer: Self-pay

## 2013-06-02 ENCOUNTER — Ambulatory Visit: Payer: Self-pay

## 2013-07-13 ENCOUNTER — Ambulatory Visit: Payer: Self-pay

## 2013-10-11 ENCOUNTER — Ambulatory Visit: Payer: Self-pay

## 2013-10-13 ENCOUNTER — Ambulatory Visit: Payer: No Typology Code available for payment source

## 2013-11-30 ENCOUNTER — Other Ambulatory Visit: Payer: Self-pay | Admitting: Family Medicine

## 2013-11-30 DIAGNOSIS — Z1231 Encounter for screening mammogram for malignant neoplasm of breast: Secondary | ICD-10-CM

## 2013-12-07 ENCOUNTER — Ambulatory Visit (HOSPITAL_COMMUNITY)
Admission: RE | Admit: 2013-12-07 | Discharge: 2013-12-07 | Disposition: A | Discharge status: Home / Self Care | Payer: No Typology Code available for payment source | Source: Ambulatory Visit | Attending: Family Medicine | Admitting: Family Medicine

## 2013-12-07 DIAGNOSIS — Z1231 Encounter for screening mammogram for malignant neoplasm of breast: Secondary | ICD-10-CM

## 2014-02-06 ENCOUNTER — Ambulatory Visit (INDEPENDENT_AMBULATORY_CARE_PROVIDER_SITE_OTHER): Payer: No Typology Code available for payment source | Admitting: Family Medicine

## 2014-02-06 ENCOUNTER — Encounter: Payer: Self-pay | Admitting: Family Medicine

## 2014-02-06 VITALS — BP 110/72 | HR 80 | Temp 98.4°F | Ht 59.0 in | Wt 141.0 lb

## 2014-02-06 DIAGNOSIS — G589 Mononeuropathy, unspecified: Secondary | ICD-10-CM

## 2014-02-06 DIAGNOSIS — G629 Polyneuropathy, unspecified: Secondary | ICD-10-CM

## 2014-02-06 DIAGNOSIS — M792 Neuralgia and neuritis, unspecified: Secondary | ICD-10-CM

## 2014-02-06 DIAGNOSIS — L439 Lichen planus, unspecified: Secondary | ICD-10-CM

## 2014-02-06 DIAGNOSIS — IMO0002 Reserved for concepts with insufficient information to code with codable children: Secondary | ICD-10-CM

## 2014-02-06 LAB — POCT GLYCOSYLATED HEMOGLOBIN (HGB A1C): HEMOGLOBIN A1C: 5.7

## 2014-02-06 MED ORDER — PREGABALIN 50 MG PO CAPS: 50.0000 mg | ORAL_CAPSULE | Freq: Three times a day (TID) | ORAL | Status: DC

## 2014-02-06 MED ORDER — TRIAMCINOLONE ACETONIDE 0.1 % EX CREA: 1.0000 "application " | TOPICAL_CREAM | Freq: Two times a day (BID) | CUTANEOUS | Status: DC

## 2014-02-06 NOTE — Assessment & Plan Note (Addendum)
Patient with pain in bilateral LE consistent with neuropathic pain. No signs of DVT on exam. A1c normal making diabetic contribution unlikely. Patient notes previously being on lyrica, so will give trial of this for her apparent neuropathic pain. Will see in follow-up in one month. Will need to consider further laboratory testing in the future for cause of this pain.

## 2014-02-06 NOTE — Progress Notes (Signed)
Patient ID: Amy Morrow, female   DOB: 09-07-1960, 53 y.o.   MRN: [id number]  Tommi Rumps, MD Phone: [phone]  Amy Morrow is a 53 y.o. female who presents today for same day appointment.  Neuropathic pain: patient has had burning pain in the bottom of her feet. Has been going on for 3 weeks. Has kept her from sleeping. The pain is unbearable after work. Notes it feels like a heaviness in her feet. She also notes the pain is on the anterior portion of her legs as well up to her knees. No pain or swelling in her calves. No recent long travel, no recent surgeries, no history of cancer. No history of blood clots. She has tried advil for this with some benefit. She is not taking the gabapentin. She notes lyrica has helped in the past.  Rash: she notes the rash in her popliteal fossa has not gotten better. There is the same rash on her right calf now. It itches. It is not erythematous. She has tried a cream on it that has been beneficial, though she does not know the name of this. Biopsy done previously showing lichenoid dermatitis. Previously treated with steroid cream.  Patient is a nonsmoker.   ROS: Per HPI   Physical Exam Filed Vitals:   02/06/14 0908  BP: 110/72  Pulse: 80  Temp: 98.4 F (36.9 C)    Gen: Well NAD HEENT: PERRL,  MMM Lungs: CTABL Nl WOB Heart: RRR no MRG Neuro: 5/5 strength in bilateral quads, hamstrings, plantar and dorsiflexion, sensation to light touch intact bilateral LE, monofilament testing normal Skin: several scattered hyperpigmented lesions on posterior right calf and in right popliteal fossa, no erythema, no excoriations MSK: no calf tenderness, swelling, or erythema, no tenderness to palpation of the anterior compartment of lower leg, neg homan sign Exts: Non edematous BL  LE, warm and well perfused.    Lab Results  Component Value Date   HGBA1C 5.7 02/06/2014    Assessment/Plan: Please see individual problem list.  # Healthcare  maintenance: not addressed, needs tdap and colo

## 2014-02-06 NOTE — Assessment & Plan Note (Signed)
Patient with new rash similar to prior rash now located on posterior calf. Will treat with triamcinolone cream. If not improving will consider biopsy for this issue. F/u in one month.

## 2014-02-06 NOTE — Patient Instructions (Signed)
Nice to see you. Your pain is most likely related to the nerves in your legs. We will treat this with lyrica. If this pain gets worse, you develop swelling or erythema in your legs, shortness of breath, or chest pain please seek medical attention. Please use the cream on your rash.  Dolor neuroptico (Neuropathic Pain) A menudo creemos que el dolor tiene una causa fsica. Si eliminamos la causa, el dolor debera irse. Los nervios en s mismos tambin pueden Engineer, drilling. Esto se denomina dolor neuroptico, que implica una anormalidad del nervio. Puede ser difcil para los pacientes que lo padecen y para los profesionales que los asisten. El dolor normalmente se describe como agudo (de corta duracin) o crnico (de larga duracin). El dolor agudo se relaciona con las sensaciones fsicas que puede provocar una lesin. Puede durar unos pocos segundos o muchas semanas, pero a menudo desaparece cuando la lesin se cura. El dolor crnico dura ms que el tiempo normal de curacin. En el dolor neuroptico, las fibras nerviosas pueden lesionarse o daarse. Entonces envan seales incorrectas a otros centros de Social research officer, government. El dolor que se siente es real, pero la causa no es fcil de Pension scheme manager.  CAUSAS El dolor crnico puede ser resultado de enfermedades como la diabetes y el herpes (una infeccin relacionada con la varicela), o debido a un traumatismo, una ciruga o una amputacin. Tambin puede ocurrir sin ninguna enfermedad o lesin conocida. Los nervios envan mensajes de dolor, an cuando no haya una causa identificable de tales mensajes.   Otras causas comunes de la neuropata incluyen diabetes, sndrome del Building control surveyor, o Sndrome de [employer] ([place]).  Como ocurre en todas las formas de dolor de espalda, si la neuropata no se trata correctamente, puede existir una serie de problemas asociados que pueden llevar a una espiral negativa para el paciente. Estos problemas incluyen depresin, insomnio,  sensacin de miedo y [place], Printmaker social reducida e incapacidad para realizar actividades cotidianas o trabajar.  El ejemplo ms dramtico y misterioso de Social research officer, government neuroptico es el denominado "sndrome del miembro fantasma". Esto ocurre cuando se amputa un brazo o una pierna debido a una enfermedad o lesin. El cerebro contina recibiendo mensajes de los nervios que originalmente transportaban los impulsos del [place]. Estos nervios envan seales incorrectas y Garment/textile technologist.  Es comn que el dolor neuroptico no tenga una causa. Responde pobremente al tratamiento para el dolor. El dolor neuroptico puede ocurrir luego de:  Herpes (Infeccin del virus Herpes Zoster).  Una sensacin de [employer] piel, causada comnmente por una lesin en el nervio perifrico.  Neuropata perifrica que es un dao generalizado en los nervios, a menudo causado por la diabetes o el alcoholismo.  Dolor del miembro fantasma luego de una amputacin.  Problemas en los nervios faciales (neuralgia del trigmino).  Esclerosis mltiple.  [place].  Dolor que Scientist, forensic y la quimioterapia.  Neuropata por atrapamiento, cuando el nervio sufre presin como en el sndrome del tnel carpiano.  Problemas de la espalda, la pierna y la cadera (citica).  Ciruga de la espalda o la espina dorsal.  Infeccin de HIV o SIDA, en donde los nervios se infectan con el virus. El profesional que le asiste le explicar los puntos de esta lista que pueden estar referidos a usted. [place] del dolor neuroptico son:   Es un dolor intenso, agudo, similar a Automotive engineer, punzante, como si le clavaran un cuchillo.  Sensacin de pinchazos.  Sensacin profunda  de [place], fro o [employer].  Debilidad persistente, hormigueo o debilidad.  La aparicion de [employer] de un toque suave u otro estmulo que normalmente no causara dolor.  Una mayor  sensibilidad hacia cosas que normalmente causan dolor (un pinchazo). Es comn que exista un dolor persistente a Proofreader de estmulos no dolorosos como un toque [place]. El dolor puede persistir por meses o aos luego de que se hayan curado los tejidos daados. Cuando esto ocurre, las seales de dolor ya no disparan la alarma ante agresiones presentes o prximas. El sistema de alarma en s no est funcionando correctamente. Dolor neuroptico Orthoptist de mejorar con [employer]. Para algunas personas, puede resultar en una incapacidad grave. Es importante saber que an un traumatismo grave en un miembro puede no producir una adecuada respuesta de proteccin Visual merchandiser.Algunas quemaduras, cortes y otras lesiones pueden pasar inadvertidas. Sin el tratamiento [place], estas lesiones pueden infectarse o producir discapacidad. Considere seriamente cualquier lesin y consulte a su mdico para recibir Clinical research associate. DIAGNSTICO Cuando tiene dolor sin causa conocida, el profesional que lo asiste Physicist, medical preguntas especficas:   Tiene otras enfermedades, como diabetes, herpes, esclerosis mltiple, o infeccin de HIV?  Cmo describira el dolor? (El dolor neuroptico a menudo se describe como punzante, lancinante, ardiente o abrasador.)  Empeora el dolor en algn momento del da? (El dolor neuroptico generalmente empeora por la noche.)  Siente que el dolor sigue determinados trayectos fsicos?  El dolor proviene de una zona que tiene nervios lesionados o ausentes? Un ejemplo sera el dolor del [employer].  El dolor se dispara por estmulos pequeos tales como el roce con las sbanas por las noches? Estas preguntas ayudan a definir el tipo de [employer]. Una vez que el profesional que lo asiste sepa lo que est ocurriendo podr Biochemist, clinical. Los antiespasmdicos, las drogas antidepresivas, y distintos analgsicos parecen funcionar en [employer]. Si  est involucrada alguna otra enfermedad, como la diabetes, un tratamiento mejor de esa enfermedad puede Best boy neuroptico.  TRATAMIENTO El dolor neuroptico a menudo es de larga duracin y tiende a no responder al tratamiento de medicamentos para el dolor de tipo narctico. Puede responder bien a otras drogas tales como anticonvulsivos y antidepresivos. Normalmente, los problemas neuropticos no se van nunca del todo, pero es posible lograr una mejora parcial con el tratamiento adecuado. Los profesionales que lo asisten tienen muchos medicamentos disponibles para tratar su problema. No se desanime si no obtiene alivio inmediato. En ocasiones deben probarse distintos medicamentos o combinaciones de ellos antes de que [employer]. Asegrese de ver al profesional que lo asiste si siente dolor que no parece provenir de ningn lado y no se va. La ayuda est a su alcance.  SOLICITE ATENCIN MDICA DE INMEDIATO SI:   Hay un cambio repentino en la calidad del dolor, especialmente si observa el cambio slo en un lado del cuerpo.  Nota modificaciones en la piel, como enrojecimiento, cambios en el color a negro o prpura, hinchazn o una lcera.  No puede mover el miembro afectado. Document Released: 10/28/2007 Document Revised: 10/13/2011 [facility] Patient Information 2015 [place]. This information is not intended to replace advice given to you by your health care provider. Make sure you discuss any questions you have with your health care provider.

## 2014-02-07 ENCOUNTER — Telehealth: Payer: Self-pay | Admitting: *Deleted

## 2014-02-07 MED ORDER — GABAPENTIN 100 MG PO CAPS: 100.0000 mg | ORAL_CAPSULE | Freq: Three times a day (TID) | ORAL | Status: DC

## 2014-02-07 NOTE — Telephone Encounter (Signed)
Message copied by Valerie Roys on Tue Feb 07, 2014  2:05 PM ------      Message from: Caryl Bis, ERIC G      Created: Mon Feb 06, 2014  5:57 PM       Please inform the patient that her A1c was normal and that she does not have diabetes. Thanks. ------

## 2014-02-07 NOTE — Telephone Encounter (Signed)
Pt is aware about result.  Also pt stated that pregabalin (LYRICA) 50 MG is very expensive $500 and she is unable to get it. Please change it.   [place]

## 2014-02-07 NOTE — Telephone Encounter (Signed)
I changed her to gabapentin. She is to take one tablet 3 times per day for 3 days, then increase to 2 tablets 3 times per day. I will see her back in the next 3-4 weeks. If this has helped we can increase the dose at that time. Please inform the patient of this. Thanks.

## 2014-02-07 NOTE — Telephone Encounter (Signed)
Will forward to [place] to call back and interpret message for her.  Edana Aguado,CMA

## 2014-02-09 NOTE — Telephone Encounter (Signed)
Pt is aware about Dr's instruction.  [place]

## 2014-03-09 ENCOUNTER — Ambulatory Visit: Payer: No Typology Code available for payment source | Admitting: Family Medicine

## 2014-06-05 ENCOUNTER — Encounter: Payer: Self-pay | Admitting: Family Medicine

## 2014-11-06 ENCOUNTER — Other Ambulatory Visit: Payer: Self-pay | Admitting: Family Medicine

## 2014-11-15 ENCOUNTER — Other Ambulatory Visit: Payer: Self-pay | Admitting: Obstetrics & Gynecology

## 2014-11-15 DIAGNOSIS — Z1231 Encounter for screening mammogram for malignant neoplasm of breast: Secondary | ICD-10-CM

## 2014-11-22 ENCOUNTER — Ambulatory Visit (HOSPITAL_COMMUNITY): Payer: Self-pay | Attending: Obstetrics & Gynecology

## 2014-12-15 ENCOUNTER — Ambulatory Visit: Payer: Self-pay | Admitting: Obstetrics & Gynecology

## 2015-01-11 ENCOUNTER — Ambulatory Visit (HOSPITAL_COMMUNITY)
Admission: RE | Admit: 2015-01-11 | Discharge: 2015-01-11 | Disposition: A | Discharge status: Home / Self Care | Payer: Self-pay | Source: Ambulatory Visit | Attending: Obstetrics and Gynecology | Admitting: Obstetrics and Gynecology

## 2015-01-11 ENCOUNTER — Ambulatory Visit (HOSPITAL_COMMUNITY)
Admission: RE | Admit: 2015-01-11 | Discharge: 2015-01-11 | Disposition: A | Discharge status: Home / Self Care | Payer: Self-pay | Source: Ambulatory Visit | Attending: Obstetrics & Gynecology | Admitting: Obstetrics & Gynecology

## 2015-01-11 ENCOUNTER — Encounter (HOSPITAL_COMMUNITY): Payer: Self-pay

## 2015-01-11 VITALS — BP 110/72 | Temp 98.7°F | Ht 59.0 in | Wt 148.0 lb

## 2015-01-11 DIAGNOSIS — Z01419 Encounter for gynecological examination (general) (routine) without abnormal findings: Secondary | ICD-10-CM

## 2015-01-11 DIAGNOSIS — Z1231 Encounter for screening mammogram for malignant neoplasm of breast: Secondary | ICD-10-CM

## 2015-01-11 NOTE — Progress Notes (Signed)
CLINIC:  Breast & Cervical Cancer Control Program (BCCCP) Clinic  REASON FOR VISIT: Well-woman exam with routine gynecological exam  HISTORY OF PRESENT ILLNESS:  Amy Morrow is a 54 y.o. female who presents to the [facility] today for clinical breast exam and routine gynecological exam.  She has no family history of breast cancer. Her last pap smear was in 01/2013 and was normal.  Her pap smear in 07/2012 was also normal.  However, she does have a history of several abnormal pap smears requiring further evaluation with colposcopy. In 12/2009, colposcopy revealed chronic cervicitis and squamous metaplasia with atypia.  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  She has no complaints today.   REVIEW OF SYSTEMS:  She denies breast pain, nodularity, nipple inversion, or nipple retraction.  She denies any abnormal vaginal bleeding, bloating, or pelvic pain.    ALLERGIES: No Known Allergies  CURRENT MEDICATIONS:  Current Outpatient Prescriptions on File Prior to Encounter  Medication Sig Dispense Refill  . acetaminophen (TYLENOL) 325 MG tablet Take 650 mg by mouth every 6 (six) hours as needed.    . bacitracin 500 UNIT/GM ointment Apply 1 application topically 2 (two) times daily. (Patient not taking: Reported on 01/11/2015) 15 g 0  . gabapentin (NEURONTIN) 100 MG capsule Take 1 capsule (100 mg total) by mouth 3 (three) times daily. (Patient not taking: Reported on 01/11/2015) 90 capsule 1  . triamcinolone cream (KENALOG) 0.1 % Apply 1 application topically 2 (two) times daily. (Patient not taking: Reported on 01/11/2015) 30 g 0   No current facility-administered medications on file prior to encounter.    PHYSICAL EXAM:  Vitals:  Filed Vitals:   01/11/15 1350  BP: 110/72  Temp: 98.7 F (37.1 C)    General: Well-nourished, well-appearing female in no acute distress.  She is unaccompanied in clinic today.  Rolena Infante, LPN and Tamsen Snider, Spanish language interpreter were present  during physical exam for this patient.  Breasts: Bilateral breasts exposed and observed with patient standing (arms at side, arms on hips, arms on hips flexed forward, and arms over head).  Right breast, subareolar region with healed scar.  Pt states she had "abscess" removed several years ago.  No other gross abnormalities including breast skin puckering or dimpling noted on observation.  Breasts symmetrical without evidence of skin redness, thickening, or peau d'orange appearance. No nipple retraction or nipple discharge noted bilaterally.  No breast nodularity palpated in bilateral breasts.  Axillary lymph nodes: No axillary lymphadenopathy bilaterally.   GU:  -External genitalia: No lesions, swelling, or discharge. Even hair distribution as expected.  -Vagina: Pink, moist. No lesions or discharge noted in vaginal canal.  -Cervix: Cervix red and friable. Cervix shape irregular. Cervical os patent.   -Uterus: Bimanual exam demonstrates no uterine mass or tenderness on palpation. Uterus in normal position and normal size.  -Adnexae: Bimanual exam demonstrates no ovarian masses or tenderness on palpation.  -Rectovaginal: No lesions noted to rectum. Rectal tone intact.  No masses or nodularity palpated by bimanual rectovaginal exam.     ASSESSMENT & PLAN:   1. Breast cancer screening: Amy Morrow has no palpable breast abnormalities on her clinical breast exam today.  She will receive her screening mammogram as scheduled.  She will be contracted by the imaging center for results of the mammogram, either by letter or phone within the next few weeks.  She was given instructions and educational materials regarding breast self-awareness. Ms. Korber is aware of this plan and agrees  with it.   2. Cervical cancer screening: Amy Morrow has an abnormal pelvic exam today based on the physical exam findings of her cervix.  A pap smear was completed today per protocol.  She tolerated the procedure without  complaints.  There was scant cervical bleeding as a result of the pap smear procedure.  She will be contacted by one of our [place] in the next few weeks to review the results of the pap smear with the patient.    Amy Morrow was encouraged to ask questions and all questions were answered to her satisfaction.    Mike Craze, NP [place]  [phone]

## 2015-01-15 LAB — CYTOLOGY - PAP

## 2015-01-24 ENCOUNTER — Telehealth (HOSPITAL_COMMUNITY): Payer: Self-pay | Admitting: *Deleted

## 2015-01-24 NOTE — Telephone Encounter (Signed)
Telephoned patient at home # and discussed negative pap smear results. HPV was negative. Next pap smear due in 3 years. Patient voiced understanding. Used interpreter Lavon Paganini.

## 2016-01-18 ENCOUNTER — Other Ambulatory Visit: Payer: Self-pay | Admitting: Obstetrics and Gynecology

## 2016-01-18 DIAGNOSIS — Z1231 Encounter for screening mammogram for malignant neoplasm of breast: Secondary | ICD-10-CM

## 2016-01-31 ENCOUNTER — Telehealth: Payer: Self-pay

## 2016-01-31 ENCOUNTER — Encounter (HOSPITAL_COMMUNITY): Payer: Self-pay

## 2016-01-31 ENCOUNTER — Ambulatory Visit
Admission: RE | Admit: 2016-01-31 | Discharge: 2016-01-31 | Disposition: A | Discharge status: Home / Self Care | Payer: No Typology Code available for payment source | Source: Ambulatory Visit | Attending: Obstetrics and Gynecology | Admitting: Obstetrics and Gynecology

## 2016-01-31 ENCOUNTER — Ambulatory Visit (HOSPITAL_COMMUNITY)
Admission: RE | Admit: 2016-01-31 | Discharge: 2016-01-31 | Disposition: A | Discharge status: Home / Self Care | Payer: Self-pay | Source: Ambulatory Visit | Attending: Obstetrics and Gynecology | Admitting: Obstetrics and Gynecology

## 2016-01-31 VITALS — BP 118/70 | Temp 98.7°F | Ht 58.5 in | Wt 158.2 lb

## 2016-01-31 DIAGNOSIS — Z1239 Encounter for other screening for malignant neoplasm of breast: Secondary | ICD-10-CM

## 2016-01-31 DIAGNOSIS — Z1231 Encounter for screening mammogram for malignant neoplasm of breast: Secondary | ICD-10-CM

## 2016-01-31 NOTE — Telephone Encounter (Signed)
Called per Anastasio Auerbach per interpreter. Patient informed about [facility] and scheduled for an appointment on July 6 at 8:15 AM.

## 2016-01-31 NOTE — Patient Instructions (Addendum)
Educational materials on breast self awareness given. Explained to Amy Morrow that she did not need a Pap smear today due to last Pap smear was 01/11/2015. Let her know BCCCP will cover Pap smears every 3 years unless has a history of abnormal Pap smears. Explained to patient that she has had three normal Pap smears since last abnormal and that she will be due for her next Pap smear in June 2019. Referred patient to the [facility] for a screening mammogram. Appointment scheduled for Thursday, January 31, 2016 at [place]. Let patient know the Breast Center will follow up with her within the next couple weeks with results by letter or phone. [place] verbalized understanding.  [place], Arvil Chaco, RN 9:54 AM

## 2016-01-31 NOTE — Progress Notes (Signed)
No complaints today.   Pap Smear:  Pap smear not completed today. Last Pap smear was 01/11/2015 in [facility] and normal. Patient's two Pap smears prior 01/21/2013 and 07/14/2012 were normal. Patient has a history of several abnormal pap smears prior to last three Pap smears that required further evaluation. Pap smear 01/09/2012 was LSIL and a repeat Pap smear in 6 months was completed that was normal. 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  Physical exam: Breasts Breasts symmetrical. No skin abnormalities bilateral breasts. No nipple retraction bilateral breasts. No nipple discharge bilateral breasts. No lymphadenopathy. No lumps palpated bilateral breasts. No complaints of pain or tenderness on exam. Referred patient to the [facility] for a screening mammogram. Appointment scheduled for Thursday, January 31, 2016 at [place].        Pelvic/Bimanual No Pap smear completed today since last Pap smear was 01/11/2015. Pap smear not indicated per BCCCP guidelines.   Smoking History: Patient has never smoked.  Patient Navigation: Patient education provided. Access to services provided for patient through [facility] program. Spanish interpreter provided.   Colorectal Cancer Screening: Patient has no history of a colonoscopy. No complaints today.  Used Spanish interpreter [employer] from [place].

## 2016-02-04 ENCOUNTER — Encounter (HOSPITAL_COMMUNITY): Payer: Self-pay | Admitting: *Deleted

## 2016-02-06 ENCOUNTER — Telehealth: Payer: Self-pay

## 2016-02-06 NOTE — Telephone Encounter (Signed)
Called back to make sure that knew how to get to [facility].

## 2016-02-06 NOTE — Telephone Encounter (Signed)
Called per Amy Morrow  to remind of [place] appointment and to be fasting. Patient was given address of [place] at [facility].

## 2016-02-07 ENCOUNTER — Ambulatory Visit (HOSPITAL_BASED_OUTPATIENT_CLINIC_OR_DEPARTMENT_OTHER): Payer: Self-pay

## 2016-02-07 ENCOUNTER — Ambulatory Visit: Payer: No Typology Code available for payment source

## 2016-02-07 VITALS — BP 118/70 | HR 76 | Temp 98.6°F | Resp 16 | Ht 59.0 in | Wt 158.8 lb

## 2016-02-07 DIAGNOSIS — Z0000 Encounter for general adult medical examination without abnormal findings: Secondary | ICD-10-CM

## 2016-02-07 NOTE — Progress Notes (Signed)
Patient is a new patient to the [facility] program and is currently a BCCCP patient effective 01/31/2016.   Clinical Measurements: Patient is 4 ft. 11 inches, weight 158.9 lbs, and BMI 32.2.   Medical History: Patient has history of high cholesterol 4 years ago and was on medication but not since then. Presently no medication. Patient does not have a history of hypertension or diabetes. Per patient no diagnosed history of coronary heart disease, heart attack, heart failure, stroke/TIA, vascular disease or congenital heart defects.  Blood Pressure, Self-measurement: Patient states has no reason to check Blood pressure.  Nutrition Assessment: Patient stated that eats 2 fruits every day. Patient states she eats 1 serving of vegetables a day. Per patient does eat 3 or more ounces of whole grains daily. Patient doesn't eat two or more servings of fish weekly. Patient states that eats fish once a week. Patient states she does drink more than 36 ounces or 450 calories of beverages with added sugars weekly. Patient stated she does watch her salt intake.   Physical Activity Assessment: Patient stated she has trouble with feet and does not exercise.. Patient stated  probably been doing 60 minutes of moderate exercise a week and no vigorous exercise.  Smoking Status: Patient stated that has never smoked and is not exposed to smoke.  Quality of Life Assessment: In assessing patient's physical quality of life she stated that out of the past 30 days that she has felt her health was good all days. Patient also stated that in the past 30 days that her mental health was not good including stress, depression and problems with emotions for 4 days. Patient did state that out of the past 30 days she felt her physical or mental health had not kept her from doing her usual activities including self-care, work or recreation for all days.   Plan: Lab work will be done today including a lipid panel and Hgb A1C. Will call lab  results when they are finished. Patient will receive Health Coaching for risk reduction initially and then health coaching as needed by patient.

## 2016-02-07 NOTE — Patient Instructions (Signed)
Discussed health assessment with patient. Will start eating more omega three fish. She will be called with results of lab work and we will then discussed any further follow up the patient needs. Patient verbalized understanding.

## 2016-02-07 NOTE — Progress Notes (Signed)
IRST HEALTH COACH: During initial screening with Anastasio Auerbach interpreter present, wellness health coaching was started.  Nutrition: Discussed with patient that normally should eat 2 servings of fruits and 2 to 21/2 of vegetables a day with a serving being 1/2 cup or small piece. Informed patient that two servings of fish were recommended per week, being ones with omega threes. Listed the fishes with omega three's for patient.Discussed that should have 5 to 7 ounces of grains a day. Explained to patient that should eat fruits and vegetables high in fiber but 3 of her 5 to 7 grains of fiber should be whole grain. Examples of whole grains were given.  Informed that need to decrease amount of sugar intake by avoiding fine sugars and too much fruit juice. Four ounces of juice is a serving of fruit. Discussed decreasing salt intake by mainly adding some while cook and none at table. The patient stated that is what she does.  Physical Activity:  Patient was Informed that minimal moderate activity is 150 minutes a week or 75 minutes of vigorous. Patient needs to increase her minimal moderate exercise. Demonstrated chair exercises. Discussed adding in some flexeibily and balance exercises.  PLAN: Will set goal when call lab results and do second health coaching. Will think about what goals may want to set.

## 2016-02-08 ENCOUNTER — Telehealth: Payer: Self-pay

## 2016-02-08 LAB — LIPID PANEL
CHOL/HDL RATIO: 4.6 ratio — AB (ref 0.0–4.4)
Cholesterol, Total: 224 mg/dL — ABNORMAL HIGH (ref 100–199)
HDL: 49 mg/dL (ref >39)
LDL Calculated: 153 mg/dL — ABNORMAL HIGH (ref 0–99)
Triglycerides: 112 mg/dL (ref 0–149)
VLDL Cholesterol Cal: 22 mg/dL (ref 5–40)

## 2016-02-08 LAB — HEMOGLOBIN A1C
Est. average glucose Bld gHb Est-mCnc: 128 mg/dL
HEMOGLOBIN A1C: 6.1 % — AB (ref 4.8–5.6)

## 2016-02-08 NOTE — Telephone Encounter (Signed)
LAB RESULTS: Interpreter Anastasio Auerbach called to inform patient about lab work from 02/07/16. Interpreter informed patient: cholesterol- 224, HDL- 49, LDL- 153, triglycerides - 112,  HBG-A1C - 6.1, and BMI 32.2.  SECOND HEALTH COACHING: Did risk reduction Heach Coaching related to BMI/Weight, cholesterol, LDL, and A1C. We discussed how starches break down into sugar in the body and the need to watch size and number of servings that eat a day. Disussed how better to have starches high in fiber like brown or wild rice. Discussed need to only have 6 servings of starches a day.  We talked about importance of exercise and increasing fiber in the diet. Discussed the need to loose weight, cut back calories, serving sizes, avoid refined foods, and choose healthier fats. Examples were given of each of the past subject matter.  Patient was told that she could call me anytime. I was going to let her work on the troubled areas and would call back in a month to see how is doing and do further coaching or see if wants to come in for person to person coaching.  PLAN: Patient will put above recommendations slowly into action. Patient and I will discuss more health coaching concerning patients exercise, nutrition and weight loss goals. Will call patient in a month.  Time: 20 minutes

## 2016-03-20 ENCOUNTER — Telehealth: Payer: Self-pay

## 2016-03-20 NOTE — Telephone Encounter (Addendum)
[place] Patient called per phone today for Health Coaching with interpreter Lavon Paganini present. Patient's areas of concern were weight, cholesterol, LDL, and exercise.  HEALTH COACHING: Patient stated had decreased the amount of bread, rice, and grease she is eating. Per patient has increased the amount of vegetables that she is eating. Patient stated that is following what told her to do. When interpreter talked with patient more she was eating too much fruit and was not real familiar with which foods were starches. Patient stated that is walking 60 minutes on most days. Per patient all of this has helped her feel much better.Patient states that can not tell that lost any weight.  Interpreter and patient talked about needing to recheck A1C within three to six months so patient is interested in getting primary care doctor. Decided that would leave packer at front desk for patient to pick up and that patient will call interpreter when she comes to go over materials. It was stressed to patient that A1C needs to be checked in three to six months.  PLAN:  Will continue with increasing exercise. Will continue with the good meal and nutrition changes. Will be doing follow up assessment when call next.  TIME: 15 minutes

## 2016-09-15 ENCOUNTER — Telehealth: Payer: Self-pay

## 2016-09-15 NOTE — Telephone Encounter (Signed)
Left message regarding need for Follow-up with [facility] program. Call assisted by Benjamine Sprague - Interpretor.

## 2017-05-20 ENCOUNTER — Encounter (HOSPITAL_COMMUNITY): Payer: Self-pay

## 2017-12-07 ENCOUNTER — Ambulatory Visit (INDEPENDENT_AMBULATORY_CARE_PROVIDER_SITE_OTHER): Payer: No Typology Code available for payment source | Admitting: Obstetrics & Gynecology

## 2017-12-07 ENCOUNTER — Encounter: Payer: Self-pay | Admitting: Obstetrics & Gynecology

## 2017-12-07 VITALS — BP 118/88 | HR 75 | Ht <= 58 in | Wt 149.9 lb

## 2017-12-07 DIAGNOSIS — N888 Other specified noninflammatory disorders of cervix uteri: Secondary | ICD-10-CM

## 2017-12-07 DIAGNOSIS — N939 Abnormal uterine and vaginal bleeding, unspecified: Secondary | ICD-10-CM

## 2017-12-07 NOTE — Progress Notes (Signed)
History:  57 y.o. [id number] here today for eval of AUB. Pt reports bleeding mainly with intercourse. Pt reports pt that prior to April she has no bleeding for 10 years. In April pt started having bleeding.  It is less today but, still with spotting.    The following portions of the patient's history were reviewed and updated as appropriate: allergies, current medications, past family history, past medical history, past social history, past surgical history and problem list.  Review of Systems:  Pertinent items are noted in HPI.    Objective:  Physical Exam Last menstrual period 11/16/2012.  CONSTITUTIONAL: Well-developed, well-nourished female in no acute distress.  HENT:  Normocephalic, atraumatic EYES: Conjunctivae and EOM are normal. No scleral icterus.  NECK: Normal range of motion SKIN: Skin is warm and dry. No rash noted. Not diaphoretic.No pallor. [place]: Alert and oriented to person, place, and time. Normal coordination.  Pelvic: Normal appearing external genitalia; 3 cm mass on cervix.Dionisio David. Exam stopped   Labs and Imaging 01/11/2015 Adequacy Reason Satisfactory for evaluation, endocervical/transformation zone component PRESENT. Diagnosis NEGATIVE FOR INTRAEPITHELIAL LESION OR MALIGNANCY. BENIGN REACTIVE/REPARATIVE CHANGES. Willeen Niece MD Pathologist, Electronic Signature (Case signed 01/16/2015) Source CervicoVaginal Pap [ThinPrep Imaged] Molecular Results Test Result HPV High Risk NOT DETECTED   Assessment & Plan:  PMPB- cervical mass very suspicious for cervical cancer noted. Biopsy NOT done  Will refer pt to [facility]  Pt to come back ASAP for bx  I emphasized to pt the importance of getting in ASAP and getting back for her bx.    I have explained to her my concern that I suspect cervical cancer. Pt expressed  understanding and will f/u with [facility]     The entire visit was communicated via an interpreter.   Toma Arts L. Harraway-Smith, M.D., Cherlynn June

## 2017-12-07 NOTE — Patient Instructions (Addendum)
La menopausia (Menopause) La menopausia es el perodo normal de la vida en el cual los perodos menstruales cesan por completo. Se completa cuando no se tienen 12 perodos menstruales consecutivos. Ocurre generalmente en mujeres entre los 40 y los 55 aos. Muy rara vez una mujer tiene la menopausia antes de los 40 aos. En la menopausia, los ovarios dejan de producir las hormonas femeninas estrgeno y progesterona. Esto puede causar sntomas indeseables y tambin afectar a la salud. A veces los sntomas aparecen entre 4 y 5 aos antes de que comience la menopausia. No existe una relacin entre la menopausia y:  Los anticonceptivos orales.  La cantidad de hijos que tuvo.  La raza.  La edad en que comenzaron los perodos menstruales (menarca). Las mujeres que fuman mucho y las que son muy delgadas pueden desarrollar la menopausia a una edad ms temprana. CAUSAS  Los ovarios dejan de producir las hormonas femeninas estrgeno y progesterona.  Otras causas son: ? Ciruga en la que se extirpan ambos ovarios. ? Los ovarios que dejan de funcionar sin un motivo conocido. ? Tumores de la glndula pituitaria en el cerebro. ? Enfermedades que afectan los ovarios y la produccin de hormonas. ? Radioterapia en el abdomen o la pelvis. ? Quimioterapia que afecte a los ovarios.  SNTOMAS  Acaloramiento.  Sudoracin nocturna.  Disminucin del deseo sexual.  Sequedad vaginal y adelgazamiento de la vagina, lo que causa relaciones sexuales dolorosas.  Sequedad de la piel y aparicin de arrugas.  Dolores de cabeza.  Cansancio.  Irritabilidad.  Problemas de memoria.  Aumento de peso.  Infeccin de la vejiga.  Aparicin de vello en el rostro y el pecho.  Infertilidad. Los sntomas ms graves pueden incluir:  Prdida de masa sea osteoporosis) que favorece la rotura de huesos(fracturas).  Depresin.  Endurecimiento y estrechamiento de las arterias (aterosclerosis) lo que puede causar  infarto e ictus. DIAGNSTICO  El perodo menstrual no aparece por 12 meses seguidos.  Examen fsico.  Estudios hormonales de la sangre.  TRATAMIENTO Hay muchas opciones de tratamiento y casi tantas dudas como opciones existen. La decisin de tratar o no los cambios que trae la menopausia es una decisin que realiza el profesional de acuerdo con cada persona en particular. El profesional comentar las opciones de tratamiento con usted. Juntos podrn decidir qu tratamiento es el mejor para usted. Las opciones de tratamiento pueden incluir:  Terapia hormonal (estrgenos y progesterona).  Medicamentos no hormonales.  Tratamiento de los sntomas individuales con medicamentos (por ejemplo antidepresivos para la depresin).  Algunos medicamentos herbales pueden ayudar en sntomas especficos.  Psicoterapia con un psiclogo o psiquiatra.  Terapia grupal.  Cambios en el estilo de vida, como: ? Consumir una dieta saludable. ? Actividad fsica regular. ? Limitar el consumo de cafena y alcohol. ? Control del estrs y meditacin.  No realizar tratamiento. INSTRUCCIONES PARA EL CUIDADO EN EL HOGAR  Tome todos los medicamentos como el mdico le indique.  Descanse y duerma lo suficiente.  Haga ejercicios regularmente.  Consuma una dieta que contenga calcio (bueno para los huesos) y productos derivados de la soja (actan como estrgenos).  Evite las bebidas alcohlicas.  No fume.  Si tiene sofocones, vstase en capas.  Tome suplementos de calcio y vitamina D para fortalecer los huesos.  Puede utilizar lubricantes y humectantes de venta libre para la sequedad vaginal.  En algunos casos la terapia de grupo podr ayudarla.  La acupuntura puede ser de ayuda en ciertos casos.  SOLICITE ATENCIN MDICA SI:    No est segura de estar en la menopausia.  Tiene sntomas de [place] y [place] asesoramiento y Clinical research associate.  Tiene 27 aos y an tiene Chartered certified accountant.  Tiene  dolor durante las [employer].  La menopausia se ha completado (no ha tenido el perodo menstrual durante 12 meses) y tiene un sangrado vaginal.  Necesita ser derivada a un especialista (gineclogo, psiquiatra, o psiclogo) para Arts administrator.  SOLICITE ATENCIN MDICA DE INMEDIATO SI:  Siente una depresin intensa e incontrolable.  Tiene una hemorragia vaginal abundante.  Siente y cree que se ha fracturado un hueso.  Siente dolor al [employer].  Siente dolor en el pecho o en la pierna.  Tiene latidos cardacos rpidos (palpitaciones).  Siente dolor de cabeza intenso.  Tiene problemas de visin.  Siente un bulto en la mama.  Siente un dolor abdominal intenso o indigestin.  Esta informacin no tiene Marine scientist el consejo del mdico. Asegrese de hacerle al mdico cualquier pregunta que tenga. Document Released: 09/01/2006 Document Revised: 03/23/2013 Document Reviewed: 02/17/2013 Elsevier Interactive Patient Education  2017 [place] de cuello del tero (Cervical Cancer) El cuello del tero es la abertura y la parte inferior del tero, que se encuentra entre la vagina y Nurse, learning disability. El cncer de cuello del tero es un tipo de tumor bastante frecuente. Ocurre con ms frecuencia en mujeres entre los 40 y los 49 aos. Las clulas del cuello del tero actan en gran parte como las clulas de la piel. Estas clulas estn expuestas a toxinas, virus y bacterias que pueden causar modificaciones anormales. Hay dos tipos de cncer de cuello del tero:  Carcinoma de clulas escamosas. Este tipo de cncer se inicia en las clulas planas, similares a escamas, que recubren el cuello del tero. El carcinoma de clulas escamosas puede desarrollarse por una infeccin transmitida sexualmente causada por el virus del papiloma humano (VPH).  Adenocarcinoma. Este tipo de cncer se inicia en las clulas glandulares que recubren el cuello del tero. [place] riesgo de sufrir cncer de cuello del tero se relaciona con el estilo de vida, la historia sexual, la salud y el sistema inmunitario. Los riesgos de cncer de cuello del tero incluyen:  Tener una infeccin viral de transmisin sexual. Estas pueden ser: ? Clamidia. ? Herpes. ? VPH.  Ser sexualmente activa antes de los 18 aos.  Tener ms de una pareja sexual o tener sexo con alguien que tenga ms de una pareja sexual.  No usar condones con sus Advertising copywriter.  Haber sufrido cncer en la vagina o en la vulva.  Tener una pareja sexual que tenga o haya tenido cncer de pene o que haya tenido una pareja sexual con clulas anormales del cuello del tero (displasia) o cncer de cuello del tero.  Usar anticonceptivos orales (tambin llamados pldoras para el control de la natalidad).  El hbito de fumar.  Tener un sistema inmunitario debilitado. Por ejemplo padecer el virus de inmunodeficiencia humano (VIH) u otros trastornos de dficit inmunitario.  Ser hija de una mujer que tom dietilestilbestrol (DES) durante el embarazo.  Tener una hermana o madre que hayan sufrido cncer de cuello del tero.  Pertenecer a la Mining engineer, hispana, asitica o ser [employer] de las [place].  Historia de displasia en el cuello del tero. [place] sntomas generalmente no estn presentes en las primeras etapas del cncer de cuello del tero. Una vez que el cncer invade el cuello del tero  y los tejidos que lo rodean, la mujer puede tener:  Sangrado vaginal anormal o sangrado menstrual que es ms prolongado o intenso que lo habitual.  Therapist, art despus de Office manager sexuales, de hacerse una ducha vaginal o un Papanicolaou.  Sangrado vaginal luego de la menopausia.  Flujo vaginal anormal.  Molestias o dolor plvico.  Papanicolaou anormal.  Dolor durante las relaciones sexuales. Los sntomas de cncer de cuello del tero ms avanzado pueden  incluir:  Prdida del apetito o prdida de peso.  Cansancio (fatiga).  Dolor en la espalda y las piernas.  Incapacidad de Aeronautical engineer orina o los movimientos intestinales. DIAGNSTICO Se indicar un examen plvico y un Papanicolaou para diagnosticar la enfermedad. Si se encuentran anormalidades durante el Papanicolaou, deber repetirse a los 3 meses, o el mdico podr indicar estudios o procedimientos adicionales como:  [place]. Este procedimiento [place] un microscopio especial que permite al [employer] aumentar y examinar de cerca las clulas del cuello del tero, la vagina y la vulva.  Biopsias cervicales. Es un procedimiento en el que se toman pequeas muestras de tejido del cuello del tero para que un especialista las examine en un microscopio.  Biopsia en cono. Este procedimiento se realiza para Chief of Staff o para extirpar tejido canceroso. Puede ser necesario realizar ms pruebas, por ejemplo:  Una cistoscopa.  Proctoscopa o sigmoideoscopa.  Ecografas.  Tomografa computarizada.  Resonancia magntica.  Laparoscopia. El cncer de cuello del tero tiene diferentes etapas:  Etapa 0, carcinoma in situ (CIS): esta es la primera etapa del cncer y es la ltima etapa y la ms grave de la displasia.  Etapa I: significa que el tumor se encuentra nicamente en el tero y en el cuello del tero.  Etapa II: significa que el tumor se ha diseminado hacia la zona superior de la vagina. El cncer se ha diseminado ms all del tero, pero no a las paredes de la pelvis o hacia el tercio inferior de la vagina.  Etapa III: significa que el tumor ha invadido las paredes laterales de la pelvis y el tercio inferior de la vagina. Si el tumor obstruye los conductos que llevan la orina a la vejiga (urteres), puede hacer que la orina vuelva hacia arriba y los riones se hinchen (hidronefrosis).  Etapa IV: significa que el tumor se ha diseminado hacia el recto o la vejiga. En la parte posterior  de esta etapa, puede diseminarse a rganos distantes, como los pulmones. [place] opciones de tratamiento son:  Biopsia en cono para retirar el tejido canceroso.  Extirpacin del todo el tero y el cuello del tero.  Extirpacin del tero, el cuello del tero, la porcin superior de la vagina, los ganglios linfticos y el tejido que los rodea (histerectoma radical modificada). Los ovarios puede dejarse o ser extirpados.  Medicamentos para tratar Science writer.  Una combinacin de [name], radiacin y quimioterapia.  Modificadores de Chartered loss adjuster. Estas son sustancias que ayudan a Art therapist inmunitario en su lucha contra el cncer o las infecciones. Pueden utilizarse en combinacin con la quimioterapia. INSTRUCCIONES PARA EL CUIDADO EN EL HOGAR  Hgase un examen ginecolgico y un Papanicolaou una vez por ao, o segn las indicaciones de su mdico.  Aplquese la vacuna contra el VPH.  No fume.  No tenga relaciones sexuales hasta que el mdico la autorice.  Use un condn cada vez que tenga [employer].  SOLICITE ATENCIN MDICA SI:  Aumenta el dolor abdominal o plvico.  Se siente cada vez ms cansada.  Aumenta el dolor en las piernas o la espalda.  Tiene fiebre.  Observa una hemorragia o secrecin vaginal anormal.  Pierde peso.  SOLICITE ATENCIN MDICA DE INMEDIATO SI:  No puede orinar.  Observa sangre en la orina.  Lollie Marrow o siente presin al mover el intestino.  Comienza a sentir dolor intenso en la espalda, el estmago o la pelvis.  Esta informacin no tiene Marine scientist el consejo del mdico. Asegrese de hacerle al mdico cualquier pregunta que tenga. Document Released: 11/15/2012 Document Revised: 07/26/2013 Document Reviewed: 01/12/2013 Elsevier Interactive Patient Education  2017 [name].

## 2017-12-14 ENCOUNTER — Other Ambulatory Visit: Payer: Self-pay | Admitting: Obstetrics and Gynecology

## 2017-12-14 DIAGNOSIS — Z1231 Encounter for screening mammogram for malignant neoplasm of breast: Secondary | ICD-10-CM

## 2017-12-22 ENCOUNTER — Ambulatory Visit
Admission: RE | Admit: 2017-12-22 | Discharge: 2017-12-22 | Disposition: A | Discharge status: Home / Self Care | Payer: No Typology Code available for payment source | Source: Ambulatory Visit | Attending: Obstetrics and Gynecology | Admitting: Obstetrics and Gynecology

## 2017-12-22 ENCOUNTER — Ambulatory Visit (HOSPITAL_COMMUNITY)
Admission: RE | Admit: 2017-12-22 | Discharge: 2017-12-22 | Disposition: A | Discharge status: Home / Self Care | Payer: Self-pay | Source: Ambulatory Visit | Attending: Obstetrics and Gynecology | Admitting: Obstetrics and Gynecology

## 2017-12-22 ENCOUNTER — Encounter (HOSPITAL_COMMUNITY): Payer: Self-pay

## 2017-12-22 VITALS — BP 116/72

## 2017-12-22 DIAGNOSIS — Z01419 Encounter for gynecological examination (general) (routine) without abnormal findings: Secondary | ICD-10-CM

## 2017-12-22 DIAGNOSIS — Z1231 Encounter for screening mammogram for malignant neoplasm of breast: Secondary | ICD-10-CM

## 2017-12-22 DIAGNOSIS — N888 Other specified noninflammatory disorders of cervix uteri: Secondary | ICD-10-CM

## 2017-12-22 NOTE — Patient Instructions (Signed)
Explained breast self awareness with Seabron Spates. Let patient know that follow-up for today's Pap smear will be based on the results. Referred patient to the [place] for a screening mammogram. Appointment scheduled for Tuesday, Dec 22, 2017 at 1510. Patient aware of appointment and will be there. Let patient know will follow up with her within the next couple weeks with results of Pap smear by phone. Informed patient that the [facility] will follow-up with her within the next couple of weeks with results of mammogram by letter or phone. [place] verbalized understanding.  Delio Slates, Arvil Chaco, RN 3:51 PM

## 2017-12-22 NOTE — Progress Notes (Signed)
Patient referred to [facility] by the [facility] [place] [facility] due to a cervical mass.  Pap Smear: Pap smear completed today due to patient was referred to [facility] for a cervical mass. Last Pap smear was 01/11/2015 in [facility] and normal with negative HPV. Patient's two Pap smears prior 01/21/2013 and 07/14/2012 were normal. Patient has a history of several abnormal pap smears prior to last three Pap smears that required further evaluation. Pap smear 01/09/2012 was LSIL and a repeat Pap smear in 6 months was completed that was normal. Patient had an abnormal Pap smear 04/03/2011 that a colposcopy was completed for follow-up. 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  Physical exam: Breasts Breasts symmetrical. No skin abnormalities bilateral breasts. No nipple retraction bilateral breasts. No nipple discharge bilateral breasts. No lymphadenopathy. No lumps palpated bilateral breasts. No complaints of pain or tenderness on exam. Referred patient to the [place] for a screening mammogram. Appointment scheduled for Tuesday, Dec 22, 2017 at 1510.        Pelvic/Bimanual   Ext Genitalia No lesions, no swelling and no discharge observed on external genitalia.         Vagina Vagina pink and normal texture. No lesions or discharge observed in vagina.          Cervix Cervix is present. Irregular mass observed on cervix. Cervix friable. No discharge observed. Will refer patient to the [facility] [place] [facility] for follow-up after receive Pap smear results.    Uterus Uterus is present and palpable. Uterus in normal position and slightly enlarged.        Adnexae Bilateral ovaries present and palpable. No tenderness on palpation.         Rectovaginal No rectal exam completed today since patient had no  rectal complaints. No skin abnormalities observed on exam.    Smoking History: Patient has never smoked.  Patient Navigation: Patient education provided. Access to services provided for patient through [facility] program. Spanish interpreter provided.   Colorectal Cancer Screening: Per patient has never had a colonoscopy completed. No complaints today. FIT Test given to patient to complete and return to [facility].  Breast and Cervical Cancer Risk Assessment: Patient has no family history of breast cancer, known genetic mutations, or radiation treatment to the chest before age 78. Patient has a  history of cervical dysplasia. Patient has no history of being immunocompromised or DES exposure in-utero. Patients 5-Year risk for breast cancer is 0.7% and Lifetime risk is 5.2%.  Used Spanish interpreter [employer] from [place].No complaints today.

## 2017-12-24 LAB — CYTOLOGY - PAP
Diagnosis: NEGATIVE
HPV: DETECTED — AB

## 2018-01-05 ENCOUNTER — Telehealth (HOSPITAL_COMMUNITY): Payer: Self-pay | Admitting: *Deleted

## 2018-01-05 NOTE — Telephone Encounter (Signed)
Called patient with Spanish interpreter Amy Morrow. Explained to patient that her Pap smear was normal and HPV positive. Explained HPV to patient and answered questions. Told patient that per Dr. Ihor Morrow that follow-up at the [facility] [place] [facility] for a biopsy is recommended. Gave patient follow-up appointment for Tuesday, January 12, 2018 at [place]. Let patient know that the appointment may not be covered by [facility]. Told patient that she will need to complete financial assistance paperwork and can get at follow-up appointment. Patient verbalized understanding.

## 2018-01-08 ENCOUNTER — Encounter (HOSPITAL_COMMUNITY): Payer: Self-pay | Admitting: *Deleted

## 2018-01-12 ENCOUNTER — Ambulatory Visit (INDEPENDENT_AMBULATORY_CARE_PROVIDER_SITE_OTHER): Payer: Medicaid Other | Admitting: Family Medicine

## 2018-01-12 ENCOUNTER — Encounter: Payer: Self-pay | Admitting: Family Medicine

## 2018-01-12 ENCOUNTER — Other Ambulatory Visit (HOSPITAL_COMMUNITY)
Admission: RE | Admit: 2018-01-12 | Discharge: 2018-01-12 | Disposition: A | Discharge status: Home / Self Care | Payer: Medicaid Other | Source: Ambulatory Visit | Attending: Family Medicine | Admitting: Family Medicine

## 2018-01-12 VITALS — BP 118/69 | HR 69

## 2018-01-12 DIAGNOSIS — N888 Other specified noninflammatory disorders of cervix uteri: Secondary | ICD-10-CM

## 2018-01-12 DIAGNOSIS — C539 Malignant neoplasm of cervix uteri, unspecified: Secondary | ICD-10-CM | POA: Insufficient documentation

## 2018-01-12 NOTE — Patient Instructions (Signed)
Biopsia cervicouterina, cuidados posteriores (Cervical Biopsy, Care After) Siga estas instrucciones durante las prximas semanas. Estas indicaciones le proporcionan informacin acerca de cmo deber cuidarse despus del procedimiento. El mdico tambin podr darle instrucciones ms especficas. El tratamiento ha sido planificado segn las prcticas mdicas actuales, pero en algunos casos pueden ocurrir problemas. Comunquese con el mdico si tiene algn problema o dudas despus del procedimiento. QU ESPERAR DESPUS DEL PROCEDIMIENTO Despus del procedimiento, es comn [employer] siguientes sntomas:  [place] abdominales o dolor leve durante algunos das.  Hemorragia vaginal leve durante algunos das.  Secrecin vaginal oscura durante [employer]. [place] los medicamentos de venta libre y los recetados solamente como se lo haya indicado el mdico.  Reanude sus actividades normales como se lo haya indicado el mdico. Pregntele al mdico qu actividades son seguras para usted.  Use una compresa higinica hasta que la hemorragia y la secrecin se detengan.  No use tampones hasta que el mdico la autorice.  No se haga duchas vaginales hasta que el mdico la autorice.  No tenga relaciones sexuales hasta que el mdico la autorice.  Concurra a todas las visitas de control como se lo haya indicado el mdico. Esto es importante. SOLICITE ATENCIN MDICA SI:  Tiene fiebre o siente escalofros.  Tiene secrecin vaginal con mal olor.  Tiene picazn o irritacin alrededor de la vagina.  Siente dolor en la parte inferior del abdomen. SOLICITE ATENCIN MDICA DE INMEDIATO SI:  Tiene hemorragia vaginal abundante que empapa ms de una compresa higinica por hora.  Se desmaya.  Siente un dolor muy intenso en la parte inferior del abdomen. Esta informacin no tiene Marine scientist el consejo del mdico. Asegrese de hacerle al mdico cualquier  pregunta que tenga. Document Released: 04/11/2015 Document Revised: 04/11/2015 Document Reviewed: 12/06/2014 Elsevier Interactive Patient Education  2018 [name].

## 2018-01-12 NOTE — Progress Notes (Signed)
Video Interpreter # [phone]

## 2018-01-12 NOTE — Progress Notes (Signed)
    Subjective:    Patient ID: Amy Morrow is a 57 y.o. female presenting with Biopsy  on 01/12/2018  HPI: Here today for cervical biopsy. Pap at [facility] showed normal cytology and + HR HPV. Has known cervical mass. Previously with CIN1 in 2012. Most recent prior pap in 2016 was WNL with Neg HPV  Review of Systems  Constitutional: Negative for chills and fever.  Respiratory: Negative for shortness of breath.   Cardiovascular: Negative for chest pain.  Gastrointestinal: Negative for abdominal pain, nausea and vomiting.  Genitourinary: Negative for dysuria.  Skin: Negative for rash.      Objective:    BP 118/69   Pulse 69   LMP 11/16/2012  Physical Exam  Constitutional: She is oriented to person, place, and time. She appears well-developed and well-nourished. No distress.  HENT:  Head: Normocephalic and atraumatic.  Eyes: No scleral icterus.  Neck: Neck supple.  Cardiovascular: Normal rate.  Pulmonary/Chest: Effort normal.  Abdominal: Soft.  Genitourinary:  Genitourinary Comments: 2.5 cm mass noted on the anterior lip of the cervix. Posterior lip appears normal.  Neurological: She is alert and oriented to person, place, and time.  Skin: Skin is warm and dry.  Psychiatric: She has a normal mood and affect.   Procedure: Cervix visualized and mass noted.  Mass biopsied x 4.  Hemostasis obtained with Monsel's solution.     Assessment & Plan:   Problem List Items Addressed This Visit      Unprioritized   Cervical mass - Primary   Relevant Orders   Surgical pathology      Total face-to-face time with patient: 10 minutes. Over 50% of encounter was spent on counseling and coordination of care. Return in about 2 weeks (around 01/26/2018) for a follow-up to review results.  Donnamae Jude 01/12/2018 8:44 AM

## 2018-01-15 ENCOUNTER — Other Ambulatory Visit: Payer: Self-pay

## 2018-01-15 NOTE — Addendum Note (Signed)
Addended by: Amado Coe on: 01/15/2018 02:41 PM   Modules accepted: Orders

## 2018-01-17 LAB — SPECIMEN STATUS REPORT

## 2018-01-17 LAB — FECAL OCCULT BLOOD, IMMUNOCHEMICAL: FECAL OCCULT BLD: NEGATIVE

## 2018-01-18 ENCOUNTER — Encounter (HOSPITAL_COMMUNITY): Payer: Self-pay

## 2018-01-25 ENCOUNTER — Ambulatory Visit (INDEPENDENT_AMBULATORY_CARE_PROVIDER_SITE_OTHER): Payer: Medicaid Other | Admitting: Family Medicine

## 2018-01-25 ENCOUNTER — Encounter: Payer: Self-pay | Admitting: Family Medicine

## 2018-01-25 DIAGNOSIS — C531 Malignant neoplasm of exocervix: Secondary | ICD-10-CM

## 2018-01-25 NOTE — Progress Notes (Signed)
   Subjective:    Patient ID: Amy Morrow is a 57 y.o. female presenting with Follow-up  on 01/25/2018  HPI: Here today for f/u. Biopsy results show cervial cancer. Scheduled for GYN ONc f/u already Spanish interpreter: Mariel used   Review of Systems  Constitutional: Negative for chills and fever.  Respiratory: Negative for shortness of breath.   Cardiovascular: Negative for chest pain.  Gastrointestinal: Negative for abdominal pain, nausea and vomiting.  Genitourinary: Negative for dysuria.  Skin: Negative for rash.      Objective:    BP 108/74   Pulse 86   Ht 5\' 2"  (1.575 m)   Wt 152 lb 9.6 oz (69.2 kg)   LMP 11/16/2012   BMI 27.91 kg/m  Physical Exam  Constitutional: She is oriented to person, place, and time. She appears well-developed and well-nourished. No distress.  HENT:  Head: Normocephalic and atraumatic.  Eyes: No scleral icterus.  Neck: Neck supple.  Cardiovascular: Normal rate.  Pulmonary/Chest: Effort normal.  Abdominal: Soft.  Neurological: She is alert and oriented to person, place, and time.  Skin: Skin is warm and dry.  Psychiatric: She has a normal mood and affect.        Assessment & Plan:   Problem List Items Addressed This Visit      Unprioritized   Cervical cancer ([place])    Diagnosis reviewed with patient. Questions answered. Treatment possibilities reviewed. She has questions about her chances of survival. Reviewed previous pap smears, recommendations for f/u, given nml pap and neg HPV in 2016. She denies any new sexual partners, but is unsure about her husband. Referral to GYN/ONC made and patient is aware of timing of this appointment.         Total face-to-face time with patient: 25 minutes. Over 50% of encounter was spent on counseling and coordination of care. No follow-ups on file. Future Appointments  Date Time Provider [place]  02/01/2018 12:15 PM Isabel Caprice, MD [facility] None    Donnamae Jude 01/25/2018 10:52 AM

## 2018-01-25 NOTE — Patient Instructions (Signed)
Cáncer de cuello del útero  (Cervical Cancer)  El cuello del útero es la abertura y la parte inferior del útero, que se encuentra entre la vagina y el útero. El cáncer de cuello del útero es un tipo de tumor bastante frecuente. Ocurre con más frecuencia en mujeres entre los 40 y los 55 años. Las células del cuello del útero actúan en gran parte como las células de la piel. Estas células están expuestas a toxinas, virus y bacterias que pueden causar modificaciones anormales.  Hay dos tipos de cáncer de cuello del útero:  · Carcinoma de células escamosas. Este tipo de cáncer se inicia en las células planas, similares a escamas, que recubren el cuello del útero. El carcinoma de células escamosas puede desarrollarse por una infección transmitida sexualmente causada por el virus del papiloma humano (VPH).  · Adenocarcinoma. Este tipo de cáncer se inicia en las células glandulares que recubren el cuello del útero.  FACTORES DE RIESGO  El riesgo de sufrir cáncer de cuello del útero se relaciona con el estilo de vida, la historia sexual, la salud y el sistema inmunitario. Los riesgos de cáncer de cuello del útero incluyen:  · Tener una infección viral de transmisión sexual. Estas pueden ser:  ? Clamidia.  ? Herpes.  ? VPH.  · Ser sexualmente activa antes de los 18 años.  · Tener más de una pareja sexual o tener sexo con alguien que tenga más de una pareja sexual.  · No usar condones con sus parejas sexuales.  · Haber sufrido cáncer en la vagina o en la vulva.  · Tener una pareja sexual que tenga o haya tenido cáncer de pene o que haya tenido una pareja sexual con células anormales del cuello del útero (displasia) o cáncer de cuello del útero.  · Usar anticonceptivos orales (también llamados píldoras para el control de la natalidad).  · El hábito de fumar.  · Tener un sistema inmunitario debilitado. Por ejemplo padecer el virus de inmunodeficiencia humano (VIH) u otros trastornos de déficit inmunitario.   · Ser hija de una mujer que tomó dietilestilbestrol (DES) durante el embarazo.  · Tener una hermana o madre que hayan sufrido cáncer de cuello del útero.  · Pertenecer a la raza afroamericana, hispana, asiática o ser una mujer de las [place].  · Historia de displasia en el cuello del útero.  SIGNOS Y SÍNTOMAS  Los síntomas generalmente no están presentes en las primeras etapas del cáncer de cuello del útero. Una vez que el cáncer invade el cuello del útero y los tejidos que lo rodean, la mujer puede tener:  · Sangrado vaginal anormal o sangrado menstrual que es más prolongado o intenso que lo habitual.  · Sangrar después de tener relaciones sexuales, de hacerse una ducha vaginal o un Papanicolaou.  · Sangrado vaginal luego de la menopausia.  · Flujo vaginal anormal.  · Molestias o dolor pélvico.  · Papanicolaou anormal.  · Dolor durante las relaciones sexuales.  Los síntomas de cáncer de cuello del útero más avanzado pueden incluir:  · Pérdida del apetito o pérdida de peso.  · Cansancio (fatiga).  · Dolor en la espalda y las piernas.  · Incapacidad de controlar la orina o los movimientos intestinales.  DIAGNÓSTICO  Se indicará un examen pélvico y un Papanicolaou para diagnosticar la enfermedad. Si se encuentran anormalidades durante el Papanicolaou, deberá repetirse a los 3 meses, o el médico podrá indicar estudios o procedimientos adicionales como:  · Colposcopía. Este procedimiento   utiliza un microscopio especial que permite al médico aumentar y examinar de cerca las células del cuello del útero, la vagina y la vulva.  · Biopsias cervicales. Es un procedimiento en el que se toman pequeñas muestras de tejido del cuello del útero para que un especialista las examine en un microscopio.  · Biopsia en cono. Este procedimiento se realiza para evaluar o para extirpar tejido canceroso.  Puede ser necesario realizar más pruebas, por ejemplo:  · Una cistoscopía.  · Proctoscopía o sigmoideoscopía.  · Ecografías.   · Tomografía computarizada.  · Resonancia magnética.  · Laparoscopia.  El cáncer de cuello del útero tiene diferentes etapas:  · Etapa 0, carcinoma in situ (CIS): esta es la primera etapa del cáncer y es la última etapa y la más grave de la displasia.  · Etapa I: significa que el tumor se encuentra únicamente en el útero y en el cuello del útero.  · Etapa II: significa que el tumor se ha diseminado hacia la zona superior de la vagina. El cáncer se ha diseminado más allá del útero, pero no a las paredes de la pelvis o hacia el tercio inferior de la vagina.  · Etapa III: significa que el tumor ha invadido las paredes laterales de la pelvis y el tercio inferior de la vagina. Si el tumor obstruye los conductos que llevan la orina a la vejiga (uréteres), puede hacer que la orina vuelva hacia arriba y los riñones se hinchen (hidronefrosis).  · Etapa IV: significa que el tumor se ha diseminado hacia el recto o la vejiga. En la parte posterior de esta etapa, puede diseminarse a órganos distantes, como los pulmones.  TRATAMIENTO  Las opciones de tratamiento son:  · Biopsia en cono para retirar el tejido canceroso.  · Extirpación del todo el útero y el cuello del útero.  · Extirpación del útero, el cuello del útero, la porción superior de la vagina, los ganglios linfáticos y el tejido que los rodea (histerectomía radical modificada). Los ovarios puede dejarse o ser extirpados.  · Medicamentos para tratar el cáncer.  · Una combinación de cirugía, radiación y quimioterapia.  · Modificadores de la respuesta biológica. Estas son sustancias que ayudan a fortalecer el sistema inmunitario en su lucha contra el cáncer o las infecciones. Pueden utilizarse en combinación con la quimioterapia.  INSTRUCCIONES PARA EL CUIDADO EN EL HOGAR  · Hágase un examen ginecológico y un Papanicolaou una vez por año, o según las indicaciones de su médico.  · Aplíquese la vacuna contra el VPH.  · No fume.   · No tenga relaciones sexuales hasta que el médico la autorice.  · Use un condón cada vez que tenga relaciones sexuales.    SOLICITE ATENCIÓN MÉDICA SI:  · Aumenta el dolor abdominal o pélvico.  · Se siente cada vez más cansada.  · Aumenta el dolor en las piernas o la espalda.  · Tiene fiebre.  · Observa una hemorragia o secreción vaginal anormal.  · Pierde peso.    SOLICITE ATENCIÓN MÉDICA DE INMEDIATO SI:  · No puede orinar.  · Observa sangre en la orina.  · Observa sangre o siente presión al mover el intestino.  · Comienza a sentir dolor intenso en la espalda, el estómago o la pelvis.    Esta información no tiene como fin reemplazar el consejo del médico. Asegúrese de hacerle al médico cualquier pregunta que tenga.  Document Released: 11/15/2012 Document Revised: 07/26/2013 Document Reviewed: 01/12/2013  Elsevier Interactive Patient Education © 2017   Elsevier Inc[employer]

## 2018-01-26 ENCOUNTER — Encounter: Payer: Self-pay | Admitting: Family Medicine

## 2018-01-26 NOTE — Assessment & Plan Note (Signed)
Diagnosis reviewed with patient. Questions answered. Treatment possibilities reviewed. She has questions about her chances of survival. Reviewed previous pap smears, recommendations for f/u, given nml pap and neg HPV in 2016. She denies any new sexual partners, but is unsure about her husband. Referral to GYN/ONC made and patient is aware of timing of this appointment.

## 2018-01-28 NOTE — Progress Notes (Signed)
Consult Note: GYN-ONC New Patient FIRST VISIT  Consult was requested by Dr. Darron Doom, MD   CC:  Chief Complaint  Patient presents with  . Cervical mass    HPI: [name]  is a very nice 57 y.o.  P5  She has been undergoing cervical cancer screening through [facility]. 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 01/2012 LSIL + endometrial cells 07/2012 Pap negative 01/2013 Pap negative with HRHPV not detected 01/2015 Pap negative and HRHPV not detected  Was noting postmenopausal bleeding and saw [facility] again. 12/2017 Pap negative now HRHPV detected - Mass noted on exam and she was referred to the [facility] where Dr. 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   Referred for management. Only complaint is the PMB. Denies pain. States bleeding minimal.  Imported EPIC Oncologic History:    No history exists.    Radiology: . Pending to be ordered  Current Meds:  Outpatient Encounter Medications as of 02/01/2018  Medication Sig  . acetaminophen (TYLENOL) 325 MG tablet Take 650 mg by mouth every 6 (six) hours as needed. Reported on 01/31/2016  . ibuprofen (ADVIL,MOTRIN) 100 MG chewable tablet Chew 200 mg by mouth every 8 (eight) hours as needed. Reported on 01/31/2016   No facility-administered encounter medications on file as of 02/01/2018.     Allergy: No Known Allergies  Social Hx:  Tobacco use: none Alcohol use: none Illicit Drug use: none Illicit IV Drug use: none  Past Surgical Hx:  Past Surgical History:  Procedure Laterality Date  . Transvaginal tape placement  2009  . TUBAL LIGATION      Past Medical Hx:  Past Medical History:  Diagnosis Date  . Abnormal Pap smear   . Anemia   . Hyperlipidemia     Past Gynecological History:   GYNECOLOGIC HISTORY:  Patient's last menstrual period was 11/16/2012. Menarche: 57 years old P 5 LMP 57 yo Contraceptive 5 years OCP HRT none  Last Pap  see HPI  Family Hx:  Family History  Problem Relation Age of Onset  . Hypertension Mother   . Heart disease Mother   . Hypertension Brother   . Heart disease Brother     Review of Systems:  Review of Systems  Constitutional: Negative.   HENT:  Negative.   Eyes: Negative.   Respiratory: Negative.   Cardiovascular: Negative.   Gastrointestinal: Negative.   Endocrine: Negative.   Genitourinary: Positive for vaginal bleeding.   Musculoskeletal: Negative.   Skin: Negative.   Neurological: Negative.   Hematological: Negative.   Psychiatric/Behavioral: Negative.     Vitals:  Blood pressure 136/77, pulse 99, temperature 98.7 F (37.1 C), temperature source Oral, resp. rate 20, height 4\' 11"  (1.499 m), weight 148 lb 12.8 oz (67.5 kg), last menstrual period 11/16/2012, SpO2 99 %. Body mass index is 30.05 kg/m.   Physical Exam: ECOG PERFORMANCE STATUS: 1 - Symptomatic but completely ambulatory   General :  Well developed, 57 y.o., female in no apparent distress HEENT:  Normocephalic/atraumatic, symmetric, EOMI, eyelids normal Neck:   Supple, no masses.  Lymphatics:  No cervical/ submandibular/ supraclavicular/ infraclavicular/ inguinal adenopathy Respiratory:  Respirations unlabored, no use of accessory muscles CV:   Deferred Breast:  Deferred Musculoskeletal: No CVA tenderness, normal muscle strength. Abdomen:  Soft, non-tender and nondistended. No evidence of hernia. No masses. Extremities:  No lymphedema, no erythema, non-tender. Skin:   Normal inspection Neuro/Psych:  No focal  motor deficit, no abnormal mental status. Normal gait. Normal affect. Alert and oriented to person, place, and time  Genito Urinary: Vulva: Normal external female genitalia.  Bladder/urethra: Urethral meatus normal in size and location. No lesions or   masses, well supported bladder Speculum exam: Vagina: Distal anterior vagina I can feel evidence of TVT. No lesion, no discharge, no  bleeding. Cervix: 2-3cm  X 1.5 cm x 1cm fungating lesion protruding from anterior lip of cervix. Posterior cervical lip normal. Vaginal fornices free of disease.  Bimanual exam:  Uterus: Normal size, mobile.  Adnexa: No masses. Rectovaginal:  Good tone, no masses, no cul de sac nodularity, no parametrial involvement or nodularity.  Oncologic Summary: 1. Stage IB1 SCCa Cervix   Assessment/Plan: 1. Cervical cancer management o Through the interpreter we discussed the treatment options surgery versus radiation with similar cancer outcomes, but different morbidities. o We discussed the idea of "radical" hysterectomy and that the organs that can be at higher risk of injury include the urinary tract and bowel. o We talked about modality of surgery with the prospective data showing possible worse outcomes with laparoscopic approach, but several single-institutional studies showing similar cancer outcomes with the two modalities. o Other risks/benefits related to laparoscopy versus open were discussed, including time to recover, infection risks, pain etc. o I drew a surgical sketch on the whiteboard in the presence of the interpreter; plan for radical hysterectomy, BSO, sentinel lymph node procedure, possible pelvic lymphadenectomy 2. Imaging will be ordered o PET and / or CT to rule out obvious metastatic disease 3. After discussion above and consideration she prefers to proceed to laparoscopic approach o Dr. Denman George will be able to accommodate her for that procedure and we will proceed with scheduling 4. Either myself or Dr. Denman George will follow her in the postop time period and I did briefly discuss the anticipated surveillance schedule of every 3-6 month visits over 5 years due to the possibility of recurrence. 5. The patient presented with her husband and with the help of the interpreter they had no further questions   Isabel Caprice, MD  02/01/2018, 3:10 PM    Cc: Darron Doom, MD (Referring  Ob/Gyn)

## 2018-01-28 NOTE — H&P (View-Only) (Signed)
Consult Note: GYN-ONC New Patient FIRST VISIT  Consult was requested by Dr. Darron Doom, MD   CC:  Chief Complaint  Patient presents with  . Cervical mass    HPI: [name]  is a very nice 57 y.o.  P5  She has been undergoing cervical cancer screening through [facility]. 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 01/2012 LSIL + endometrial cells 07/2012 Pap negative 01/2013 Pap negative with HRHPV not detected 01/2015 Pap negative and HRHPV not detected  Was noting postmenopausal bleeding and saw [facility] again. 12/2017 Pap negative now HRHPV detected - Mass noted on exam and she was referred to the [facility] where Dr. 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   Referred for management. Only complaint is the PMB. Denies pain. States bleeding minimal.  Imported EPIC Oncologic History:    No history exists.    Radiology: . Pending to be ordered  Current Meds:  Outpatient Encounter Medications as of 02/01/2018  Medication Sig  . acetaminophen (TYLENOL) 325 MG tablet Take 650 mg by mouth every 6 (six) hours as needed. Reported on 01/31/2016  . ibuprofen (ADVIL,MOTRIN) 100 MG chewable tablet Chew 200 mg by mouth every 8 (eight) hours as needed. Reported on 01/31/2016   No facility-administered encounter medications on file as of 02/01/2018.     Allergy: No Known Allergies  Social Hx:  Tobacco use: none Alcohol use: none Illicit Drug use: none Illicit IV Drug use: none  Past Surgical Hx:  Past Surgical History:  Procedure Laterality Date  . Transvaginal tape placement  2009  . TUBAL LIGATION      Past Medical Hx:  Past Medical History:  Diagnosis Date  . Abnormal Pap smear   . Anemia   . Hyperlipidemia     Past Gynecological History:   GYNECOLOGIC HISTORY:  Patient's last menstrual period was 11/16/2012. Menarche: 57 years old P 5 LMP 57 yo Contraceptive 5 years OCP HRT none  Last Pap  see HPI  Family Hx:  Family History  Problem Relation Age of Onset  . Hypertension Mother   . Heart disease Mother   . Hypertension Brother   . Heart disease Brother     Review of Systems:  Review of Systems  Constitutional: Negative.   HENT:  Negative.   Eyes: Negative.   Respiratory: Negative.   Cardiovascular: Negative.   Gastrointestinal: Negative.   Endocrine: Negative.   Genitourinary: Positive for vaginal bleeding.   Musculoskeletal: Negative.   Skin: Negative.   Neurological: Negative.   Hematological: Negative.   Psychiatric/Behavioral: Negative.     Vitals:  Blood pressure 136/77, pulse 99, temperature 98.7 F (37.1 C), temperature source Oral, resp. rate 20, height 4\' 11"  (1.499 m), weight 148 lb 12.8 oz (67.5 kg), last menstrual period 11/16/2012, SpO2 99 %. Body mass index is 30.05 kg/m.   Physical Exam: ECOG PERFORMANCE STATUS: 1 - Symptomatic but completely ambulatory   General :  Well developed, 57 y.o., female in no apparent distress HEENT:  Normocephalic/atraumatic, symmetric, EOMI, eyelids normal Neck:   Supple, no masses.  Lymphatics:  No cervical/ submandibular/ supraclavicular/ infraclavicular/ inguinal adenopathy Respiratory:  Respirations unlabored, no use of accessory muscles CV:   Deferred Breast:  Deferred Musculoskeletal: No CVA tenderness, normal muscle strength. Abdomen:  Soft, non-tender and nondistended. No evidence of hernia. No masses. Extremities:  No lymphedema, no erythema, non-tender. Skin:   Normal inspection Neuro/Psych:  No focal  motor deficit, no abnormal mental status. Normal gait. Normal affect. Alert and oriented to person, place, and time  Genito Urinary: Vulva: Normal external female genitalia.  Bladder/urethra: Urethral meatus normal in size and location. No lesions or   masses, well supported bladder Speculum exam: Vagina: Distal anterior vagina I can feel evidence of TVT. No lesion, no discharge, no  bleeding. Cervix: 2-3cm  X 1.5 cm x 1cm fungating lesion protruding from anterior lip of cervix. Posterior cervical lip normal. Vaginal fornices free of disease.  Bimanual exam:  Uterus: Normal size, mobile.  Adnexa: No masses. Rectovaginal:  Good tone, no masses, no cul de sac nodularity, no parametrial involvement or nodularity.  Oncologic Summary: 1. Stage IB1 SCCa Cervix   Assessment/Plan: 1. Cervical cancer management o Through the interpreter we discussed the treatment options surgery versus radiation with similar cancer outcomes, but different morbidities. o We discussed the idea of "radical" hysterectomy and that the organs that can be at higher risk of injury include the urinary tract and bowel. o We talked about modality of surgery with the prospective data showing possible worse outcomes with laparoscopic approach, but several single-institutional studies showing similar cancer outcomes with the two modalities. o Other risks/benefits related to laparoscopy versus open were discussed, including time to recover, infection risks, pain etc. o I drew a surgical sketch on the whiteboard in the presence of the interpreter; plan for radical hysterectomy, BSO, sentinel lymph node procedure, possible pelvic lymphadenectomy 2. Imaging will be ordered o PET and / or CT to rule out obvious metastatic disease 3. After discussion above and consideration she prefers to proceed to laparoscopic approach o Dr. Denman George will be able to accommodate her for that procedure and we will proceed with scheduling 4. Either myself or Dr. Denman George will follow her in the postop time period and I did briefly discuss the anticipated surveillance schedule of every 3-6 month visits over 5 years due to the possibility of recurrence. 5. The patient presented with her husband and with the help of the interpreter they had no further questions   Isabel Caprice, MD  02/01/2018, 3:10 PM    Cc: Darron Doom, MD (Referring  Ob/Gyn)

## 2018-02-01 ENCOUNTER — Encounter: Payer: Self-pay | Admitting: Obstetrics

## 2018-02-01 ENCOUNTER — Inpatient Hospital Stay: Payer: Medicaid Other | Attending: Obstetrics | Admitting: Obstetrics

## 2018-02-01 VITALS — BP 136/77 | HR 99 | Temp 98.7°F | Resp 20 | Ht 59.0 in | Wt 148.8 lb

## 2018-02-01 DIAGNOSIS — N888 Other specified noninflammatory disorders of cervix uteri: Secondary | ICD-10-CM

## 2018-02-01 DIAGNOSIS — C531 Malignant neoplasm of exocervix: Secondary | ICD-10-CM

## 2018-02-01 DIAGNOSIS — R000 Tachycardia, unspecified: Secondary | ICD-10-CM | POA: Insufficient documentation

## 2018-02-01 DIAGNOSIS — N898 Other specified noninflammatory disorders of vagina: Secondary | ICD-10-CM | POA: Diagnosis not present

## 2018-02-01 DIAGNOSIS — Z90710 Acquired absence of both cervix and uterus: Secondary | ICD-10-CM | POA: Diagnosis not present

## 2018-02-01 DIAGNOSIS — N821 Other female urinary-genital tract fistulae: Secondary | ICD-10-CM | POA: Insufficient documentation

## 2018-02-01 DIAGNOSIS — Z90722 Acquired absence of ovaries, bilateral: Secondary | ICD-10-CM | POA: Diagnosis not present

## 2018-02-01 NOTE — Patient Instructions (Signed)
1) Plan to have a PET scan tomorrow, February 02, 2018 at [facility].  Nothing to eat or drink after midnight before your test.  Arrive at 11:30am.                 Preparing for your Surgery  Plan for surgery on February 08, 2018 with Dr. [name] at [place] will be scheduled for a robotic assisted radical hysterectomy, bilateral salpingo-oophorectomy, lymphadenectomy.   Pre-operative Testing -You will receive a phone call from presurgical testing at [facility] to arrange for a pre-operative testing appointment before your surgery.  This appointment normally occurs one to two weeks before your scheduled surgery.   -Bring your insurance card, copy of an advanced directive if applicable, medication list  -At that visit, you will be asked to sign a consent for a possible blood transfusion in case a transfusion becomes necessary during surgery.  The need for a blood transfusion is rare but having consent is a necessary part of your care.    -You should not be taking blood thinners or aspirin at least ten days prior to surgery unless instructed by your surgeon.  Day Before Surgery at [place] will be asked to take in a light diet the day before surgery.  Avoid carbonated beverages.  You will be advised to have nothing to eat or drink after midnight the evening before.    Eat a light diet the day before surgery.  Examples including soups, broths, toast, yogurt, mashed potatoes.  Things to avoid include carbonated beverages (fizzy beverages), raw fruits and raw vegetables, or beans.   If your bowels are filled with gas, your surgeon will have difficulty visualizing your pelvic organs which increases your surgical risks.  Your role in recovery Your role is to become active as soon as directed by your doctor, while still giving yourself time to heal.  Rest when you feel tired. You will be asked to do the following in order to speed your recovery:  - Cough and breathe  deeply. This helps toclear and expand your lungs and can prevent pneumonia. You may be given a spirometer to practice deep breathing. A staff member will show you how to use the spirometer. - Do mild physical activity. Walking or moving your legs help your circulation and body functions return to normal. A staff member will help you when you try to walk and will provide you with simple exercises. Do not try to get up or walk alone the first time. - Actively manage your pain. Managing your pain lets you move in comfort. We will ask you to rate your pain on a scale of zero to 10. It is your responsibility to tell your doctor or nurse where and how much you hurt so your pain can be treated.  Special Considerations -If you are diabetic, you may be placed on insulin after surgery to have closer control over your blood sugars to promote healing and recovery.  This does not mean that you will be discharged on insulin.  If applicable, your oral antidiabetics will be resumed when you are tolerating a solid diet.  -Your final pathology results from surgery should be available by the Friday after surgery and the results will be relayed to you when available.  -Dr. Lahoma Crocker is the Surgeon that assists your GYN Oncologist with surgery.  The next day after your surgery you will either see your GYN Oncologist or Dr. Lahoma Crocker.  Blood Transfusion Information WHAT IS A BLOOD TRANSFUSION? A transfusion is the replacement of blood or some of its parts. Blood is made up of multiple cells which provide different functions.  Red blood cells carry oxygen and are used for blood loss replacement.  White blood cells fight against infection.  Platelets control bleeding.  Plasma helps clot blood.  Other blood products are available for specialized needs, such as hemophilia or other clotting disorders. BEFORE THE TRANSFUSION  Who gives blood for transfusions?   You may be able to donate blood to  be used at a later date on yourself (autologous donation).  Relatives can be asked to donate blood. This is generally not any safer than if you have received blood from a stranger. The same precautions are taken to ensure safety when a relative's blood is donated.  Healthy volunteers who are fully evaluated to make sure their blood is safe. This is blood bank blood. Transfusion therapy is the safest it has ever been in the practice of medicine. Before blood is taken from a donor, a complete history is taken to make sure that person has no history of diseases nor engages in risky social behavior (examples are intravenous drug use or sexual activity with multiple partners). The donor's travel history is screened to minimize risk of transmitting infections, such as malaria. The donated blood is tested for signs of infectious diseases, such as HIV and hepatitis. The blood is then tested to be sure it is compatible with you in order to minimize the chance of a transfusion reaction. If you or a relative donates blood, this is often done in anticipation of surgery and is not appropriate for emergency situations. It takes many days to process the donated blood. RISKS AND COMPLICATIONS Although transfusion therapy is very safe and saves many lives, the main dangers of transfusion include:   Getting an infectious disease.  Developing a transfusion reaction. This is an allergic reaction to something in the blood you were given. Every precaution is taken to prevent this. The decision to have a blood transfusion has been considered carefully by your caregiver before blood is given. Blood is not given unless the benefits outweigh the risks.

## 2018-02-01 NOTE — Patient Instructions (Addendum)
Amy Morrow  02/01/2018   Your procedure is scheduled on:   02/08/2018   Report to [facility] Main  Entrance  Report to admitting at   5:30  AM    Call this number if you have problems the morning of surgery [phone]     Remember: Do not eat food or drink:After Midnight the night before surgery.    Eat a light diet the day before surgery.  Examples include: soups, broths, toast, yogurt and mashed potatoes.  Things to avoid include carbonated beverages, raw fruits and vegetables and beans.   NO SOLID FOOD AFTER MIDNIGHT THE NIGHT PRIOR TO SURGERY. NOTHING BY MOUTH EXCEPT CLEAR LIQUIDS UNTIL 3 HOURS PRIOR TO [place] SURGERY. PLEASE FINISH ENSURE DRINK PER SURGEON ORDER 3 HOURS PRIOR TO SCHEDULED SURGERY TIME WHICH NEEDS TO BE COMPLETED AT _____4:30__AM _____.    CLEAR LIQUID DIET   Foods Allowed                                                                     Foods Excluded  Coffee and tea, regular and decaf                             liquids that you cannot  Plain Jell-O in any flavor                                             see through such as: Fruit ices (not with fruit pulp)                                     milk, soups, orange juice  Iced Popsicles                                    All solid food Carbonated beverages, regular and diet                                    Cranberry, grape and apple juices Sports drinks like Gatorade Lightly seasoned clear broth or consume(fat free) Sugar, honey syrup  Sample Menu Breakfast                                Lunch                                     Supper Cranberry juice                    Beef broth  Chicken broth Jell-O                                     Grape juice                           Apple juice Coffee or tea                        Jell-O                                      Popsicle                                                Coffee or tea                         Coffee or tea  _____________________________________________________________________    Take these medicines the morning of surgery with A SIP OF WATER:   NONE                                You may not have any metal on your body including hair pins and              piercings  Do not wear jewelry, make-up, lotions, powders or perfumes, deodorant             Do not wear nail polish.  Do not shave  48 hours prior to surgery.  .   Do not bring valuables to the hospital. [place].  Contacts, dentures or bridgework may not be worn into surgery.  Leave suitcase in the car. After surgery it may be brought to your room.                    Please read over the following fact sheets you were given: _____________________________________________________________________    Incentive Spirometer  An incentive spirometer is a tool that can help keep your lungs clear and active. This tool measures how well you are filling your lungs with each breath. Taking long deep breaths may help reverse or decrease the chance of developing breathing (pulmonary) problems (especially infection) following:  A long period of time when you are unable to move or be active. BEFORE THE PROCEDURE   If the spirometer includes an indicator to show your best effort, your nurse or respiratory therapist will set it to a desired goal.  If possible, sit up straight or lean slightly forward. Try not to slouch.  Hold the incentive spirometer in an upright position. INSTRUCTIONS FOR USE  1. Sit on the edge of your bed if possible, or sit up as far as you can in bed or on a chair. 2. Hold the incentive spirometer in an upright position. 3. Breathe out normally. 4. Place the mouthpiece in your mouth and seal your lips tightly around it. 5. Breathe in slowly and as deeply as possible, raising the  piston or the ball toward the top of the column. 6. Hold your  breath for 3-5 seconds or for as long as possible. Allow the piston or ball to fall to the bottom of the column. 7. Remove the mouthpiece from your mouth and breathe out normally. 8. Rest for a few seconds and repeat Steps 1 through 7 at least 10 times every 1-2 hours when you are awake. Take your time and take a few normal breaths between deep breaths. 9. The spirometer may include an indicator to show your best effort. Use the indicator as a goal to work toward during each repetition. 10. After each set of 10 deep breaths, practice coughing to be sure your lungs are clear. If you have an incision (the cut made at the time of surgery), support your incision when coughing by placing a pillow or rolled up towels firmly against it. Once you are able to get out of bed, walk around indoors and cough well. You may stop using the incentive spirometer when instructed by your caregiver.  RISKS AND COMPLICATIONS  Take your time so you do not get dizzy or light-headed.  If you are in pain, you may need to take or ask for pain medication before doing incentive spirometry. It is harder to take a deep breath if you are having pain. AFTER USE  Rest and breathe slowly and easily.  It can be helpful to keep track of a log of your progress. Your caregiver can provide you with a simple table to help with this. If you are using the spirometer at home, follow these instructions: [place] IF:   You are having difficultly using the spirometer.  You have trouble using the spirometer as often as instructed.  Your pain medication is not giving enough relief while using the spirometer.  You develop fever of 100.5 F (38.1 C) or higher. SEEK IMMEDIATE MEDICAL CARE IF:   You cough up bloody sputum that had not been present before.  You develop fever of 102 F (38.9 C) or greater.  You develop worsening pain at or near the incision site. MAKE SURE YOU:   Understand these instructions.  Will watch  your condition.  Will get help right away if you are not doing well or get worse. Document Released: 12/01/2006 Document Revised: 10/13/2011 Document Reviewed: 02/01/2007 ExitCare Patient Information 2014 ExitCare, [place].   ________________________________________________________________________             WHAT IS A BLOOD TRANSFUSION? Blood Transfusion Information  A transfusion is the replacement of blood or some of its parts. Blood is made up of multiple cells which provide different functions.  Red blood cells carry oxygen and are used for blood loss replacement.  White blood cells fight against infection.  Platelets control bleeding.  Plasma helps clot blood.  Other blood products are available for specialized needs, such as hemophilia or other clotting disorders. BEFORE THE TRANSFUSION  Who gives blood for transfusions?   Healthy volunteers who are fully evaluated to make sure their blood is safe. This is blood bank blood. Transfusion therapy is the safest it has ever been in the practice of medicine. Before blood is taken from a donor, a complete history is taken to make sure that person has no history of diseases nor engages in risky social behavior (examples are intravenous drug use or sexual activity with multiple partners). The donor's travel history is screened to minimize risk of transmitting infections, such as malaria. The donated blood  is tested for signs of infectious diseases, such as HIV and hepatitis. The blood is then tested to be sure it is compatible with you in order to minimize the chance of a transfusion reaction. If you or a relative donates blood, this is often done in anticipation of surgery and is not appropriate for emergency situations. It takes many days to process the donated blood. RISKS AND COMPLICATIONS Although transfusion therapy is very safe and saves many lives, the main dangers of transfusion include:   Getting an infectious  disease.  Developing a transfusion reaction. This is an allergic reaction to something in the blood you were given. Every precaution is taken to prevent this. The decision to have a blood transfusion has been considered carefully by your caregiver before blood is given. Blood is not given unless the benefits outweigh the risks. AFTER THE TRANSFUSION  Right after receiving a blood transfusion, you will usually feel much better and more energetic. This is especially true if your red blood cells have gotten low (anemic). The transfusion raises the level of the red blood cells which carry oxygen, and this usually causes an energy increase.  The nurse administering the transfusion will monitor you carefully for complications. HOME CARE INSTRUCTIONS  No special instructions are needed after a transfusion. You may find your energy is better. Speak with your caregiver about any limitations on activity for underlying diseases you may have. SEEK MEDICAL CARE IF:   Your condition is not improving after your transfusion.  You develop redness or irritation at the intravenous (IV) site. SEEK IMMEDIATE MEDICAL CARE IF:  Any of the following symptoms occur over the next 12 hours:  Shaking chills.  You have a temperature by mouth above 102 F (38.9 C), not controlled by medicine.  Chest, back, or muscle pain.  People around you feel you are not acting correctly or are confused.  Shortness of breath or difficulty breathing.  Dizziness and fainting.  You get a rash or develop hives.  You have a decrease in urine output.  Your urine turns a dark color or changes to pink, red, or brown. Any of the following symptoms occur over the next 10 days:  You have a temperature by mouth above 102 F (38.9 C), not controlled by medicine.  Shortness of breath.  Weakness after normal activity.  The white part of the eye turns yellow (jaundice).  You have a decrease in the amount of urine or are  urinating less often.  Your urine turns a dark color or changes to pink, red, or brown. Document Released: 07/18/2000 Document Revised: 10/13/2011 Document Reviewed: 03/06/2008 ExitCare Patient Information 2014 [employer].  _______________________________________________________________________[facility] - Preparing for Surgery Before surgery, you can play an important role.  Because skin is not sterile, your skin needs to be as free of germs as possible.  You can reduce the number of germs on your skin by washing with CHG (chlorahexidine gluconate) soap before surgery.  CHG is an antiseptic cleaner which kills germs and bonds with the skin to continue killing germs even after washing. Please DO NOT use if you have an allergy to CHG or antibacterial soaps.  If your skin becomes reddened/irritated stop using the CHG and inform your nurse when you arrive at Short Stay. Do not shave (including legs and underarms) for at least 48 hours prior to the first CHG shower.  You may shave your face/neck. Please follow these instructions carefully:  1.  Shower with CHG Soap the night  before surgery and the  morning of Surgery.  2.  If you choose to wash your hair, wash your hair first as usual with your  normal  shampoo.  3.  After you shampoo, rinse your hair and body thoroughly to remove the  shampoo.                           4.  Use CHG as you would any other liquid soap.  You can apply chg directly  to the skin and wash                       Gently with a scrungie or clean washcloth.  5.  Apply the CHG Soap to your body ONLY FROM THE NECK DOWN.   Do not use on face/ open                           Wound or open sores. Avoid contact with eyes, ears mouth and genitals (private parts).                       Wash face,  Genitals (private parts) with your normal soap.             6.  Wash thoroughly, paying special attention to the area where your surgery  will be performed.  7.  Thoroughly rinse your  body with warm water from the neck down.  8.  DO NOT shower/wash with your normal soap after using and rinsing off  the CHG Soap.                9.  Pat yourself dry with a clean towel.            10.  Wear clean pajamas.            11.  Place clean sheets on your bed the night of your first shower and do not  sleep with pets. Day of Surgery : Do not apply any lotions/deodorants the morning of surgery.  Please wear clean clothes to the hospital/surgery center.  FAILURE TO FOLLOW THESE INSTRUCTIONS MAY RESULT IN THE CANCELLATION OF YOUR SURGERY PATIENT SIGNATURE_________________________________  NURSE SIGNATURE__________________________________  ________________________________________________________________________

## 2018-02-02 ENCOUNTER — Encounter (HOSPITAL_COMMUNITY)
Admission: RE | Admit: 2018-02-02 | Discharge: 2018-02-02 | Disposition: A | Discharge status: Home / Self Care | Payer: Medicaid Other | Source: Ambulatory Visit | Attending: Gynecologic Oncology | Admitting: Gynecologic Oncology

## 2018-02-02 ENCOUNTER — Other Ambulatory Visit: Payer: Self-pay

## 2018-02-02 ENCOUNTER — Ambulatory Visit (HOSPITAL_COMMUNITY)
Admission: RE | Admit: 2018-02-02 | Discharge: 2018-02-02 | Disposition: A | Discharge status: Home / Self Care | Payer: Medicaid Other | Source: Ambulatory Visit | Attending: Gynecologic Oncology | Admitting: Gynecologic Oncology

## 2018-02-02 ENCOUNTER — Encounter (HOSPITAL_COMMUNITY): Payer: Self-pay

## 2018-02-02 DIAGNOSIS — Z01812 Encounter for preprocedural laboratory examination: Secondary | ICD-10-CM | POA: Insufficient documentation

## 2018-02-02 DIAGNOSIS — C531 Malignant neoplasm of exocervix: Secondary | ICD-10-CM | POA: Insufficient documentation

## 2018-02-02 HISTORY — DX: Malignant neoplasm of cervix uteri, unspecified: C53.9

## 2018-02-02 HISTORY — DX: Urgency of urination: R39.15

## 2018-02-02 LAB — URINALYSIS, ROUTINE W REFLEX MICROSCOPIC
BILIRUBIN URINE: NEGATIVE
Glucose, UA: NEGATIVE mg/dL
Ketones, ur: NEGATIVE mg/dL
LEUKOCYTES UA: NEGATIVE
Nitrite: NEGATIVE
Protein, ur: NEGATIVE mg/dL
SPECIFIC GRAVITY, URINE: 1.018 (ref 1.005–1.030)
pH: 6 (ref 5.0–8.0)

## 2018-02-02 LAB — ABO/RH: ABO/RH(D): O POS

## 2018-02-02 LAB — COMPREHENSIVE METABOLIC PANEL
ALT: 17 U/L (ref 0–44)
ANION GAP: 7 (ref 5–15)
AST: 21 U/L (ref 15–41)
Albumin: 4.2 g/dL (ref 3.5–5.0)
Alkaline Phosphatase: 72 U/L (ref 38–126)
BILIRUBIN TOTAL: 1 mg/dL (ref 0.3–1.2)
BUN: 17 mg/dL (ref 6–20)
CALCIUM: 9 mg/dL (ref 8.9–10.3)
CO2: 28 mmol/L (ref 22–32)
Chloride: 105 mmol/L (ref 98–111)
Creatinine, Ser: 0.6 mg/dL (ref 0.44–1.00)
GFR calc Af Amer: >60 mL/min (ref >60)
Glucose, Bld: 113 mg/dL — ABNORMAL HIGH (ref 70–99)
POTASSIUM: 3.9 mmol/L (ref 3.5–5.1)
Sodium: 140 mmol/L (ref 135–145)
TOTAL PROTEIN: 7.7 g/dL (ref 6.5–8.1)

## 2018-02-02 LAB — CBC
HEMATOCRIT: 35.8 % — AB (ref 36.0–46.0)
Hemoglobin: 12.1 g/dL (ref 12.0–15.0)
MCH: 30.5 pg (ref 26.0–34.0)
MCHC: 33.8 g/dL (ref 30.0–36.0)
MCV: 90.2 fL (ref 78.0–100.0)
Platelets: 304 10*3/uL (ref 150–400)
RBC: 3.97 MIL/uL (ref 3.87–5.11)
RDW: 13.1 % (ref 11.5–15.5)
WBC: 5.3 10*3/uL (ref 4.0–10.5)

## 2018-02-02 LAB — PREGNANCY, URINE: PREG TEST UR: NEGATIVE

## 2018-02-02 LAB — GLUCOSE, CAPILLARY: GLUCOSE-CAPILLARY: 111 mg/dL — AB (ref 70–99)

## 2018-02-02 NOTE — Progress Notes (Signed)
PAT appointment done thru [employer] Spanish interpreter , Doretha Imus, pt verbalized understanding today's visit and instructions.

## 2018-02-03 ENCOUNTER — Encounter (HOSPITAL_COMMUNITY)
Admission: RE | Admit: 2018-02-03 | Discharge: 2018-02-03 | Disposition: A | Discharge status: Home / Self Care | Payer: Medicaid Other | Source: Ambulatory Visit | Attending: Gynecologic Oncology | Admitting: Gynecologic Oncology

## 2018-02-03 DIAGNOSIS — C531 Malignant neoplasm of exocervix: Secondary | ICD-10-CM | POA: Diagnosis not present

## 2018-02-03 LAB — GLUCOSE, CAPILLARY: GLUCOSE-CAPILLARY: 115 mg/dL — AB (ref 70–99)

## 2018-02-03 MED ORDER — FLUDEOXYGLUCOSE F - 18 (FDG) INJECTION
7.3900 | Freq: Once | INTRAVENOUS | Status: AC | PRN
  Administered 2018-02-03: 7.39 via INTRAVENOUS

## 2018-02-08 ENCOUNTER — Ambulatory Visit (HOSPITAL_COMMUNITY): Payer: Medicaid Other | Admitting: Registered Nurse

## 2018-02-08 ENCOUNTER — Ambulatory Visit (HOSPITAL_COMMUNITY)
Admission: RE | Admit: 2018-02-08 | Discharge: 2018-02-09 | Disposition: A | Discharge status: Home / Self Care | Payer: Medicaid Other | Source: Ambulatory Visit | Attending: Gynecologic Oncology | Admitting: Gynecologic Oncology

## 2018-02-08 ENCOUNTER — Encounter (HOSPITAL_COMMUNITY): Payer: Self-pay

## 2018-02-08 ENCOUNTER — Other Ambulatory Visit: Payer: Self-pay

## 2018-02-08 ENCOUNTER — Encounter (HOSPITAL_COMMUNITY)
Admission: RE | Disposition: A | Discharge status: Home / Self Care | Payer: Self-pay | Source: Ambulatory Visit | Attending: Gynecologic Oncology

## 2018-02-08 DIAGNOSIS — Z79899 Other long term (current) drug therapy: Secondary | ICD-10-CM | POA: Diagnosis not present

## 2018-02-08 DIAGNOSIS — N840 Polyp of corpus uteri: Secondary | ICD-10-CM | POA: Diagnosis not present

## 2018-02-08 DIAGNOSIS — D259 Leiomyoma of uterus, unspecified: Secondary | ICD-10-CM | POA: Diagnosis not present

## 2018-02-08 DIAGNOSIS — C531 Malignant neoplasm of exocervix: Secondary | ICD-10-CM

## 2018-02-08 DIAGNOSIS — C539 Malignant neoplasm of cervix uteri, unspecified: Secondary | ICD-10-CM

## 2018-02-08 HISTORY — PX: PELVIC LYMPH NODE DISSECTION: SHX6543

## 2018-02-08 HISTORY — PX: ROBOTIC ASSISTED TOTAL HYSTERECTOMY WITH BILATERAL SALPINGO OOPHERECTOMY: SHX6086

## 2018-02-08 LAB — TYPE AND SCREEN
ABO/RH(D): O POS
ANTIBODY SCREEN: NEGATIVE

## 2018-02-08 SURGERY — HYSTERECTOMY, TOTAL, ROBOT-ASSISTED, LAPAROSCOPIC, WITH BILATERAL SALPINGO-OOPHORECTOMY
Anesthesia: General

## 2018-02-08 MED ORDER — EPHEDRINE SULFATE-NACL 50-0.9 MG/10ML-% IV SOSY
PREFILLED_SYRINGE | INTRAVENOUS | Status: DC | PRN
  Administered 2018-02-08 (×2): 5 mg via INTRAVENOUS

## 2018-02-08 MED ORDER — ONDANSETRON HCL 4 MG/2ML IJ SOLN: 4.0000 mg | Freq: Four times a day (QID) | INTRAMUSCULAR | Status: DC | PRN

## 2018-02-08 MED ORDER — LIDOCAINE 2% (20 MG/ML) 5 ML SYRINGE
INTRAMUSCULAR | Status: AC
  Filled 2018-02-08: qty 5

## 2018-02-08 MED ORDER — ROCURONIUM BROMIDE 10 MG/ML (PF) SYRINGE
PREFILLED_SYRINGE | INTRAVENOUS | Status: DC | PRN
  Administered 2018-02-08: 50 mg via INTRAVENOUS
  Administered 2018-02-08: 10 mg via INTRAVENOUS

## 2018-02-08 MED ORDER — LIDOCAINE 2% (20 MG/ML) 5 ML SYRINGE
INTRAMUSCULAR | Status: DC | PRN
  Administered 2018-02-08: 60 mg via INTRAVENOUS

## 2018-02-08 MED ORDER — PHENYLEPHRINE 40 MCG/ML (10ML) SYRINGE FOR IV PUSH (FOR BLOOD PRESSURE SUPPORT)
PREFILLED_SYRINGE | INTRAVENOUS | Status: AC
  Filled 2018-02-08: qty 10

## 2018-02-08 MED ORDER — LIDOCAINE 2% (20 MG/ML) 5 ML SYRINGE
INTRAMUSCULAR | Status: DC | PRN
  Administered 2018-02-08: 1 mg/kg/h via INTRAVENOUS

## 2018-02-08 MED ORDER — CELECOXIB 200 MG PO CAPS
400.0000 mg | ORAL_CAPSULE | ORAL | Status: AC
  Administered 2018-02-08: 400 mg via ORAL
  Filled 2018-02-08: qty 2

## 2018-02-08 MED ORDER — PHENYLEPHRINE HCL 10 MG/ML IJ SOLN
INTRAVENOUS | Status: DC | PRN
  Administered 2018-02-08: 5 ug/min via INTRAVENOUS

## 2018-02-08 MED ORDER — ENOXAPARIN SODIUM 40 MG/0.4ML ~~LOC~~ SOLN
40.0000 mg | SUBCUTANEOUS | Status: AC
  Administered 2018-02-08: 40 mg via SUBCUTANEOUS
  Filled 2018-02-08: qty 0.4

## 2018-02-08 MED ORDER — PHENYLEPHRINE 40 MCG/ML (10ML) SYRINGE FOR IV PUSH (FOR BLOOD PRESSURE SUPPORT)
PREFILLED_SYRINGE | INTRAVENOUS | Status: DC | PRN
  Administered 2018-02-08 (×5): 80 ug via INTRAVENOUS

## 2018-02-08 MED ORDER — PHENYLEPHRINE HCL 10 MG/ML IJ SOLN
INTRAMUSCULAR | Status: AC
  Filled 2018-02-08: qty 1

## 2018-02-08 MED ORDER — SCOPOLAMINE 1 MG/3DAYS TD PT72
1.0000 | MEDICATED_PATCH | TRANSDERMAL | Status: DC
  Administered 2018-02-08: 1.5 mg via TRANSDERMAL
  Filled 2018-02-08: qty 1

## 2018-02-08 MED ORDER — OXYCODONE-ACETAMINOPHEN 5-325 MG PO TABS
1.0000 | ORAL_TABLET | ORAL | Status: DC | PRN
  Administered 2018-02-08 – 2018-02-09 (×2): 1 via ORAL
  Filled 2018-02-08 (×2): qty 1

## 2018-02-08 MED ORDER — ONDANSETRON HCL 4 MG/2ML IJ SOLN
INTRAMUSCULAR | Status: AC
  Filled 2018-02-08: qty 2

## 2018-02-08 MED ORDER — ACETAMINOPHEN 500 MG PO TABS
1000.0000 mg | ORAL_TABLET | Freq: Four times a day (QID) | ORAL | Status: AC
  Administered 2018-02-08 – 2018-02-09 (×3): 1000 mg via ORAL
  Filled 2018-02-08 (×4): qty 2

## 2018-02-08 MED ORDER — HYDROMORPHONE HCL 1 MG/ML IJ SOLN: 0.2500 mg | INTRAMUSCULAR | Status: DC | PRN

## 2018-02-08 MED ORDER — LIDOCAINE HCL 2 % IJ SOLN
INTRAMUSCULAR | Status: AC
  Filled 2018-02-08: qty 20

## 2018-02-08 MED ORDER — FENTANYL CITRATE (PF) 100 MCG/2ML IJ SOLN
INTRAMUSCULAR | Status: AC
Start: 2018-02-08 — End: ?
  Filled 2018-02-08: qty 2

## 2018-02-08 MED ORDER — KETOROLAC TROMETHAMINE 30 MG/ML IJ SOLN: 30.0000 mg | Freq: Four times a day (QID) | INTRAMUSCULAR | Status: DC | PRN

## 2018-02-08 MED ORDER — MIDAZOLAM HCL 2 MG/2ML IJ SOLN
INTRAMUSCULAR | Status: AC
  Filled 2018-02-08: qty 2

## 2018-02-08 MED ORDER — ROCURONIUM BROMIDE 100 MG/10ML IV SOLN
INTRAVENOUS | Status: AC
  Filled 2018-02-08: qty 1

## 2018-02-08 MED ORDER — DEXAMETHASONE SODIUM PHOSPHATE 4 MG/ML IJ SOLN: 4.0000 mg | INTRAMUSCULAR | Status: DC

## 2018-02-08 MED ORDER — ENSURE SURGERY PO LIQD
237.0000 mL | Freq: Two times a day (BID) | ORAL | Status: DC
  Administered 2018-02-08 – 2018-02-09 (×3): 237 mL via ORAL
  Filled 2018-02-08 (×3): qty 237

## 2018-02-08 MED ORDER — ONDANSETRON HCL 4 MG/2ML IJ SOLN: 4.0000 mg | Freq: Once | INTRAMUSCULAR | Status: DC | PRN

## 2018-02-08 MED ORDER — CELECOXIB 200 MG PO CAPS
200.0000 mg | ORAL_CAPSULE | Freq: Two times a day (BID) | ORAL | Status: DC
  Administered 2018-02-08 – 2018-02-09 (×2): 200 mg via ORAL
  Filled 2018-02-08 (×2): qty 1

## 2018-02-08 MED ORDER — PROPOFOL 10 MG/ML IV BOLUS
INTRAVENOUS | Status: DC | PRN
  Administered 2018-02-08: 150 mg via INTRAVENOUS

## 2018-02-08 MED ORDER — PROPOFOL 10 MG/ML IV BOLUS
INTRAVENOUS | Status: AC
  Filled 2018-02-08: qty 20

## 2018-02-08 MED ORDER — ENOXAPARIN SODIUM 40 MG/0.4ML ~~LOC~~ SOLN
40.0000 mg | SUBCUTANEOUS | Status: DC
  Administered 2018-02-09: 40 mg via SUBCUTANEOUS
  Filled 2018-02-08: qty 0.4

## 2018-02-08 MED ORDER — ONDANSETRON HCL 4 MG/2ML IJ SOLN
INTRAMUSCULAR | Status: DC | PRN
  Administered 2018-02-08: 4 mg via INTRAVENOUS

## 2018-02-08 MED ORDER — LACTATED RINGERS IV SOLN: INTRAVENOUS | Status: DC

## 2018-02-08 MED ORDER — SUGAMMADEX SODIUM 200 MG/2ML IV SOLN
INTRAVENOUS | Status: AC
  Filled 2018-02-08: qty 2

## 2018-02-08 MED ORDER — GABAPENTIN 300 MG PO CAPS
300.0000 mg | ORAL_CAPSULE | Freq: Two times a day (BID) | ORAL | Status: DC
  Administered 2018-02-08 – 2018-02-09 (×2): 300 mg via ORAL
  Filled 2018-02-08 (×2): qty 1

## 2018-02-08 MED ORDER — FENTANYL CITRATE (PF) 100 MCG/2ML IJ SOLN
INTRAMUSCULAR | Status: AC
  Filled 2018-02-08: qty 2

## 2018-02-08 MED ORDER — FENTANYL CITRATE (PF) 100 MCG/2ML IJ SOLN
INTRAMUSCULAR | Status: DC | PRN
  Administered 2018-02-08: 25 ug via INTRAVENOUS
  Administered 2018-02-08 (×2): 50 ug via INTRAVENOUS
  Administered 2018-02-08: 25 ug via INTRAVENOUS

## 2018-02-08 MED ORDER — STERILE WATER FOR IRRIGATION IR SOLN
Status: DC | PRN
  Administered 2018-02-08: 1000 mL

## 2018-02-08 MED ORDER — CEFAZOLIN SODIUM-DEXTROSE 2-4 GM/100ML-% IV SOLN
2.0000 g | INTRAVENOUS | Status: AC
  Administered 2018-02-08: 2 g via INTRAVENOUS
  Filled 2018-02-08: qty 100

## 2018-02-08 MED ORDER — POTASSIUM CHLORIDE IN NACL 20-0.9 MEQ/L-% IV SOLN
INTRAVENOUS | Status: DC
  Administered 2018-02-08 – 2018-02-09 (×2): via INTRAVENOUS
  Filled 2018-02-08 (×3): qty 1000

## 2018-02-08 MED ORDER — ACETAMINOPHEN 500 MG PO TABS
1000.0000 mg | ORAL_TABLET | ORAL | Status: AC
  Administered 2018-02-08: 1000 mg via ORAL
  Filled 2018-02-08: qty 2

## 2018-02-08 MED ORDER — GABAPENTIN 300 MG PO CAPS
300.0000 mg | ORAL_CAPSULE | ORAL | Status: AC
  Administered 2018-02-08: 300 mg via ORAL
  Filled 2018-02-08: qty 1

## 2018-02-08 MED ORDER — MEPERIDINE HCL 50 MG/ML IJ SOLN: 6.2500 mg | INTRAMUSCULAR | Status: DC | PRN

## 2018-02-08 MED ORDER — EPHEDRINE 5 MG/ML INJ
INTRAVENOUS | Status: AC
  Filled 2018-02-08: qty 10

## 2018-02-08 MED ORDER — MIDAZOLAM HCL 5 MG/5ML IJ SOLN
INTRAMUSCULAR | Status: DC | PRN
  Administered 2018-02-08: 2 mg via INTRAVENOUS

## 2018-02-08 MED ORDER — DEXAMETHASONE SODIUM PHOSPHATE 10 MG/ML IJ SOLN
INTRAMUSCULAR | Status: AC
  Filled 2018-02-08: qty 1

## 2018-02-08 MED ORDER — LACTATED RINGERS IR SOLN
Status: DC | PRN
  Administered 2018-02-08: 1000 mL

## 2018-02-08 MED ORDER — LACTATED RINGERS IV SOLN
INTRAVENOUS | Status: DC | PRN
  Administered 2018-02-08: 07:00:00 via INTRAVENOUS

## 2018-02-08 MED ORDER — ONDANSETRON HCL 4 MG PO TABS: 4.0000 mg | ORAL_TABLET | Freq: Four times a day (QID) | ORAL | Status: DC | PRN

## 2018-02-08 MED ORDER — SUGAMMADEX SODIUM 200 MG/2ML IV SOLN
INTRAVENOUS | Status: DC | PRN
  Administered 2018-02-08: 150 mg via INTRAVENOUS

## 2018-02-08 MED ORDER — HYDROMORPHONE HCL 1 MG/ML IJ SOLN: 0.5000 mg | INTRAMUSCULAR | Status: DC | PRN

## 2018-02-08 MED ORDER — DEXAMETHASONE SODIUM PHOSPHATE 10 MG/ML IJ SOLN
INTRAMUSCULAR | Status: DC | PRN
  Administered 2018-02-08: 10 mg via INTRAVENOUS

## 2018-02-08 SURGICAL SUPPLY — 53 items
APPLICATOR SURGIFLO ENDO (HEMOSTASIS) IMPLANT
BAG LAPAROSCOPIC 12 15 PORT 16 (BASKET) IMPLANT
BAG RETRIEVAL 12/15 (BASKET)
COVER BACK TABLE 60X90IN (DRAPES) ×3 IMPLANT
COVER TIP SHEARS 8 DVNC (MISCELLANEOUS) ×2 IMPLANT
COVER TIP SHEARS 8MM DA VINCI (MISCELLANEOUS) ×1
DERMABOND ADVANCED (GAUZE/BANDAGES/DRESSINGS) ×2
DERMABOND ADVANCED .7 DNX12 (GAUZE/BANDAGES/DRESSINGS) ×4 IMPLANT
DRAPE ARM DVNC X/XI (DISPOSABLE) ×8 IMPLANT
DRAPE COLUMN DVNC XI (DISPOSABLE) ×2 IMPLANT
DRAPE DA VINCI XI ARM (DISPOSABLE) ×4
DRAPE DA VINCI XI COLUMN (DISPOSABLE) ×1
DRAPE SHEET LG 3/4 BI-LAMINATE (DRAPES) ×3 IMPLANT
DRAPE SURG IRRIG POUCH 19X23 (DRAPES) ×3 IMPLANT
ELECT PENCIL ROCKER SW 15FT (MISCELLANEOUS) ×3 IMPLANT
ELECT REM PT RETURN 15FT ADLT (MISCELLANEOUS) ×3 IMPLANT
GAUZE SPONGE 4X4 16PLY XRAY LF (GAUZE/BANDAGES/DRESSINGS) ×3 IMPLANT
GLOVE BIO SURGEON STRL SZ 6 (GLOVE) ×12 IMPLANT
GLOVE BIO SURGEON STRL SZ 6.5 (GLOVE) IMPLANT
GOWN STRL REUS W/ TWL LRG LVL3 (GOWN DISPOSABLE) ×4 IMPLANT
GOWN STRL REUS W/TWL LRG LVL3 (GOWN DISPOSABLE) ×2
HOLDER FOLEY CATH W/STRAP (MISCELLANEOUS) ×3 IMPLANT
IRRIG SUCT STRYKERFLOW 2 WTIP (MISCELLANEOUS) ×3
IRRIGATION SUCT STRKRFLW 2 WTP (MISCELLANEOUS) ×2 IMPLANT
KIT PROCEDURE DA VINCI SI (MISCELLANEOUS)
KIT PROCEDURE DVNC SI (MISCELLANEOUS) IMPLANT
MANIPULATOR UTERINE 4.5 ZUMI (MISCELLANEOUS) IMPLANT
NEEDLE SPNL 18GX3.5 QUINCKE PK (NEEDLE) IMPLANT
OBTURATOR OPTICAL STANDARD 8MM (TROCAR) ×1
OBTURATOR OPTICAL STND 8 DVNC (TROCAR) ×2
OBTURATOR OPTICALSTD 8 DVNC (TROCAR) ×2 IMPLANT
PACK ROBOT GYN CUSTOM WL (TRAY / TRAY PROCEDURE) ×3 IMPLANT
PAD POSITIONING PINK XL (MISCELLANEOUS) ×3 IMPLANT
PORT ACCESS TROCAR AIRSEAL 12 (TROCAR) ×2 IMPLANT
PORT ACCESS TROCAR AIRSEAL 5M (TROCAR) ×1
POUCH SPECIMEN RETRIEVAL 10MM (ENDOMECHANICALS) ×6 IMPLANT
SEAL CANN UNIV 5-8 DVNC XI (MISCELLANEOUS) ×8 IMPLANT
SEAL XI 5MM-8MM UNIVERSAL (MISCELLANEOUS) ×4
SET TRI-LUMEN FLTR TB AIRSEAL (TUBING) ×3 IMPLANT
SURGIFLO W/THROMBIN 8M KIT (HEMOSTASIS) IMPLANT
SUT VIC AB 0 CT1 27 (SUTURE)
SUT VIC AB 0 CT1 27XBRD ANTBC (SUTURE) IMPLANT
SUT VIC AB 3-0 SH 27 (SUTURE) ×2
SUT VIC AB 3-0 SH 27X BRD (SUTURE) ×2 IMPLANT
SUT VIC AB 3-0 SH 27XBRD (SUTURE) ×2 IMPLANT
SUT VICRYL 0 UR6 27IN ABS (SUTURE) ×3 IMPLANT
SYR 10ML LL (SYRINGE) IMPLANT
TOWEL OR NON WOVEN STRL DISP B (DISPOSABLE) ×3 IMPLANT
TRAP SPECIMEN MUCOUS 40CC (MISCELLANEOUS) IMPLANT
TRAY FOLEY MTR SLVR 16FR STAT (SET/KITS/TRAYS/PACK) ×3 IMPLANT
UNDERPAD 30X30 (UNDERPADS AND DIAPERS) ×3 IMPLANT
WATER STERILE IRR 1000ML POUR (IV SOLUTION) IMPLANT
YANKAUER SUCT BULB TIP 10FT TU (MISCELLANEOUS) ×3 IMPLANT

## 2018-02-08 NOTE — Interval H&P Note (Signed)
History and Physical Interval Note:  02/08/2018 7:10 AM  Amy Morrow  has presented today for surgery, with the diagnosis of CERVICAL CANCER  The various methods of treatment have been discussed with the patient and family. After consideration of risks, benefits and other options for treatment, the patient has consented to  Procedure(s): XI ROBOTIC ASSISTED TOTAL Radical HYSTERECTOMY WITH BILATERAL SALPINGO OOPHORECTOMY (N/A) BILATEAL PEVIC  LYMPHADENECTOMY (Bilateral) as a surgical intervention .  The patient's history has been reviewed, patient examined, no change in status, stable for surgery.  I have reviewed the patient's chart and labs. I reviewed risks of the procedure again with the patient including that this was a radical procedure with increased risks compared to simple hysterectomy, the risk for postop urinary retention, the need for a foley catheter for 1 week. She has a prior urinary incontinence procedure, which I told her may increase her risk for retention postop. All discussion happened with translator present. Questions were answered to the patient's satisfaction.     Thereasa Solo

## 2018-02-08 NOTE — Transfer of Care (Signed)
Immediate Anesthesia Transfer of Care Note  Patient: Amy Morrow  Procedure(s) Performed: XI ROBOTIC ASSISTED TOTAL Radical HYSTERECTOMY WITH BILATERAL SALPINGO OOPHORECTOMY (N/A ) BILATEAL PEVIC  LYMPHADENECTOMY (Bilateral )  Patient Location: PACU  Anesthesia Type:General  Level of Consciousness: awake, alert , oriented and patient cooperative  Airway & Oxygen Therapy: Patient Spontanous Breathing and Patient connected to face mask oxygen  Post-op Assessment: Report given to RN, Post -op Vital signs reviewed and stable and Patient moving all extremities  Post vital signs: Reviewed and stable  Last Vitals:  Vitals Value Taken Time  BP 119/71 02/08/2018 11:12 AM  Temp    Pulse 98 02/08/2018 11:14 AM  Resp 14 02/08/2018 11:14 AM  SpO2 100 % 02/08/2018 11:14 AM  Vitals shown include unvalidated device data.  Last Pain:  Vitals:   02/08/18 0603  TempSrc:   PainSc: 0-No pain         Complications: No apparent anesthesia complications

## 2018-02-08 NOTE — Op Note (Signed)
OPERATIVE NOTE 02/08/18  Surgeon: Donaciano Eva   Assistants: Precious Haws (an MD assistant was necessary for tissue manipulation, management of robotic instrumentation, retraction and positioning due to the complexity of the case and hospital policies).   Anesthesia: General endotracheal anesthesia  ASA Class: 3   Pre-operative Diagnosis: IB2 cervical cancer  Post-operative Diagnosis: same  Operation: Robotic-assisted type III radical laparoscopic hysterectomy with bilateral salpingoophorectomy and bilateral pelvic lymphadenectomy  Surgeon: Donaciano Eva  Assistant Surgeon: Lahoma Crocker MD  Anesthesia: GET  Urine Output: 300cc  Operative Findings:  : 2-3cm pedunculated exophytic mass from right anterior cervix. No clinical involvement of the parametria, no suspicious nodes.   Estimated Blood Loss:  less than 100 mL      Total IV Fluids: 600 ml         Specimens: left and right pelvic lymph nodes, uterus with cervix and bilateral tubes and ovaries and upper vagina.         Complications:  None; patient tolerated the procedure well.         Disposition: PACU - hemodynamically stable.  Procedure Details  The patient was seen in the Holding Room. The risks, benefits, complications, treatment options, and expected outcomes were discussed with the patient.  The patient concurred with the proposed plan, giving informed consent.  The site of surgery properly noted/marked. The patient was identified as Amy Morrow and the procedure verified as a Robotic-assisted radical hysterectomy with bilateral salpingo oophorectomy and bilateral pelvic lymphadenectomy. A Time Out was held and the above information confirmed.  After induction of anesthesia, the patient was draped and prepped in the usual sterile manner. Pt was placed in supine position after anesthesia and draped and prepped in the usual sterile manner. The abdominal drape was placed after the CholoraPrep had  been allowed to dry for 3 minutes.  Her arms were tucked to her side with all appropriate precautions.  The chest was secured to the table.  The patient was placed in the semi-lithotomy position in [place].  The perineum was prepped with Betadine.  Foley catheter was placed.  An EEA sizer (large size) was placed vaginally to define the vaginal fornices.  A second time-out was performed.  OG tube placement was confirmed and to suction.    Procedure:  The patient was brought to the operating room where general anesthesia was administered with no complications.  The patient was placed in the dorsal lithotomy position in padded Allen stirrups.  The arms were tucked at the sides with gel pads protecting the elbows and foam protecting the hands. The patient was then prepped.  A Foley was placed to gravity. An EEA sizer was placed in the vaginal fornices.  The patient was then draped in the normal manner.  Next, a 5 mm skin incision was made 1 cm below the subcostal margin in the midclavicular line.  The 5 mm Optiview port and scope was used for direct entry.  Opening pressure was under 10 mm CO2.  The abdomen was insufflated and the findings were noted as above.   At this point and all points during the procedure, the patient's intra-abdominal pressure did not exceed 15 mmHg. Next, a 10 mm skin incision was made at the umbilicus and a right and left port was placed about 10 cm lateral to the robot port on the right and left side.  A fourth arm was placed in the left lower quadrant 2 cm above and superior and medial to  the anterior superior iliac spine.  All ports were placed under direct visualization.  The patient was placed in steep Trendelenburg.  Bowel was away into the upper abdomen.  The robot was docked in the normal manner.  The right retroperitoneum in the pelvis was opened parallel to the IP ligament. The right paravesical space was developed with monopolar and sharp dissection. It was held open with  tension on the median umbilical ligament with the forth arm. The pararectal space was opened with blunt and sharp dissection to mobilize the ureter off of the medial surface of the internal iliac artery. The medial leaf of the broad ligament containing the ureter was held medially (opening the pararectal space) by the assistant's grasper. The right pelvic lymphadenectomy was performed by skeletonizing the internal iliac artery at the bifurcation with the external iliac artery. The obturator nerve was identified in the base of lateral paravesical space. The ureter was mobilized medially off of the dissection by developing the pararectal space. The genitofemoral nerve was identified, skeletonized and mobilized laterally off of the external iliac artery. An enbloc resection of lymph nodes was performed within the following boundaries: the mid portion of the common iliac proximally, the circumflex iliac vein distally, the obturator nerve posteriorally, the genitofemoral nerve laterally. The nodal basin (including obturator space) were confirmed to be empty of nodes and hemostatic. The nodes were placed in an endocatch bag and retrieved at the end of the procedure vaginally.  The same procedure with the same steps was performed on the patient's left side. The left retroperitoneum in the pelvis was opened parallel to the IP ligament. The left paravesical space was developed with monopolar and sharp dissection. It was held open with tension on the median umbilical ligament with the forth arm. The pararectal space was opened with blunt and sharp dissection to mobilize the ureter off of the medial surface of the internal iliac artery. The medial leaf of the broad ligament containing the ureter was held medially (opening the pararectal space) by the assistant's grasper. The left pelvic lymphadenectomy was performed by skeletonizing the internal iliac artery at the bifurcation with the external iliac artery. The obturator  nerve was identified in the base of lateral paravesical space. The ureter was mobilized medially off of the dissection by developing the pararectal space. The genitofemoral nerve was identified, skeletonized and mobilized laterally off of the external iliac artery. An enbloc resection of lymph nodes was performed within the following boundaries: the mid portion of the common iliac proximally, the circumflex iliac vein distally, the obturator nerve posteriorally, the genitofemoral nerve laterally. The nodal basin (including obturator space) were confirmed to be empty of nodes and hemostatic. The nodes were placed in an endocatch bag and retrieved at the end of the procedure vaginally.  The radical hysterectomy was begun by first skeletonizing the right uterine artery at its origin from the internal iliac artery by skeletonizing it 360 degrees and defining the parauterine web between the paravesical and pararectal spaces. The right ureter was skeletonized off of its attachments to the broad ligament and mobilized laterally. Using meticulous blunt and sparing monopolar dissection in short controlled bursts, the right ureter was untunnelled from under the right uterine artery. The anterior vessicouterine ligament was developed and the bladder flap was taken down to below the level of the tumor and below the rounded portion of the EEA sizer. This then definied the anterior bladder pillar. The uterine artery was bipolar fulgarated to seal it, then transected. The uterine  vein was also sealed and resected. The lateral boundary of the parametrium was taken down with biploar and monopolar dissection to the level of the deep vaginal vein. The uterine vessels were retracted superior and medially over the ureter on the right. The ureter was untunnelled through to its entry into the bladder with meticulous sharp dissection and short bursts of monopolar energy. The ureter was dissected off of its attachments to the anterior  vagina. The anterior bladder pillar was skeletonized and sealed with bipolar energy while the ureter was deflected distally. The posterior bladder pillar was also dissected with monopolar scissors from the upper vagina.  The rectovaginal septum was entered posteriorally with sharp dissection and the rectum was dissected off of the vagina. A window was created in the broad ligament with care to mobilize the ureter laterally. This skeletonized the IP ligament which was sealed with bipolar energy and transected.The right uterosacral ligament was transected with bipolar and monopolar energy 1/2 to 2/3rds of the way towards the uterosacral ligament insertion into the sacrum. In doing so meticulous attention was made to identify and wherever possible, spare the hypogastric nerves. The right paravaginal tissues were tubularized around the vagina on the right at the inferior boundary of the dissection.  The left radical hysterectomy was then performed by first skeletonizing the left uterine artery at its origin from the internal iliac artery by skeletonizing it 360 degrees and defining the parauterine web between the paravesical and pararectal spaces. The left ureter was skeletonized off of its attachments to the broad ligament and mobilized laterally. Using meticulous blunt and sparing monopolar dissection in short controlled bursts, the left ureter was untunnelled from under the left uterine artery. The left anterior vessicouterine ligament was developed and the bladder flap was taken down to below the level of the tumor and below the rounded portion of the EEA sizer. This then definied the anterior bladder pillar. The uterine artery was bipolar fulgarated to seal it, then transected. The uterine vein was also sealed and resected. The lateral boundary of the parametrium was taken down with biploar and monopolar dissection to the level of the deep vaginal vein. The uterine vessels were retracted superior and medially  over the ureter on the right. The ureter was untunnelled through to its entry into the bladder with meticulous sharp dissection and short bursts of monopolar energy. The ureter was dissected off of its attachments to the anterior vagina. The anterior bladder pillar was skeletonized and sealed with bipolar energy while the ureter was deflected distally. The posterior bladder pillar was also dissected with monopolar scissors from the upper vagina.  The rectovaginal septum was entered posteriorally with sharp dissection and the rectum was dissected off of the vagina. A window was created in the broad ligament with care to mobilize the ureter laterally. This skeletonized the IP ligament which was sealed with bipolar energy and transected.The left uterosacral ligament was transected with bipolar and monopolar energy 1/2 to 2/3rds of the way towards the uterosacral ligament insertion into the sacrum. In doing so meticulous attention was made to identify and wherever possible, spare the hypogastric nerves. The left paravaginal tissues were tubularized around the vagina on the left at the inferior boundary of the dissection.  The colpotomy was made and the uterus, cervix, bilateral ovaries and tubes were amputated and delivered through the vagina.  Pedicles were inspected and excellent hemostasis was achieved.    The colpotomy at the vaginal cuff was closed with two 0-Vicryl on a CT1 needle  in a running manner.  Irrigation was used and excellent hemostasis was achieved.  At this point in the procedure was completed.  Robotic instruments were removed under direct visulaization.  The robot was undocked. The 10 mm ports were closed with Vicryl on a UR-5 needle and the fascia was closed with 0 Vicryl on a UR-5 needle.  The skin was closed with 4-0 Vicryl in a subcuticular manner.  Dermabond was applied.  The vagina was swabbed and bleeding was noted. A speculum was inserted vaginally and the vaginal cuff was reinforced  with interrupted figure of 8 sutures to achieve hemostasis.  Sponge, lap and needle counts correct x 2.  The patient was taken to the recovery room in stable condition.  All instrument and needle counts were correct x  3.   The patient was transferred to the recovery room in a stable condition.  Donaciano Eva, MD

## 2018-02-08 NOTE — Anesthesia Preprocedure Evaluation (Signed)
Anesthesia Evaluation  Patient identified by MRN, date of birth, ID band Patient awake    Reviewed: Allergy & Precautions, NPO status , Patient's Chart, lab work & pertinent test results  Airway Mallampati: I  TM Distance: >3 FB Neck ROM: Full    Dental   Pulmonary    Pulmonary exam normal        Cardiovascular Normal cardiovascular exam     Neuro/Psych    GI/Hepatic   Endo/Other    Renal/GU      Musculoskeletal   Abdominal   Peds  Hematology   Anesthesia Other Findings   Reproductive/Obstetrics                             Anesthesia Physical Anesthesia Plan  ASA: II  Anesthesia Plan: General   Post-op Pain Management:    Induction: Intravenous  PONV Risk Score and Plan: 3 and Ondansetron and Dexamethasone  Airway Management Planned: Oral ETT  Additional Equipment:   Intra-op Plan:   Post-operative Plan: Extubation in OR  Informed Consent: I have reviewed the patients History and Physical, chart, labs and discussed the procedure including the risks, benefits and alternatives for the proposed anesthesia with the patient or authorized representative who has indicated his/her understanding and acceptance.     Plan Discussed with: CRNA and Surgeon  Anesthesia Plan Comments:         Anesthesia Quick Evaluation

## 2018-02-08 NOTE — Anesthesia Postprocedure Evaluation (Signed)
Anesthesia Post Note  Patient: Amy Morrow  Procedure(s) Performed: XI ROBOTIC ASSISTED TOTAL Radical HYSTERECTOMY WITH BILATERAL SALPINGO OOPHORECTOMY (N/A ) BILATEAL PEVIC  LYMPHADENECTOMY (Bilateral )     Patient location during evaluation: PACU Anesthesia Type: General Level of consciousness: awake and alert Pain management: pain level controlled Vital Signs Assessment: post-procedure vital signs reviewed and stable Respiratory status: spontaneous breathing, nonlabored ventilation, respiratory function stable and patient connected to nasal cannula oxygen Cardiovascular status: blood pressure returned to baseline and stable Postop Assessment: no apparent nausea or vomiting Anesthetic complications: no    Last Vitals:  Vitals:   02/08/18 1455 02/08/18 1733  BP: 97/60 100/63  Pulse: 82 79  Resp: 14 13  Temp: 36.7 C 37.1 C  SpO2: 99% 99%    Last Pain:  Vitals:   02/08/18 1733  TempSrc: Oral  PainSc:                  Spurgeon Gancarz DAVID

## 2018-02-08 NOTE — Progress Notes (Signed)
Used [employer] interpreter service, "[employer]", for first 30 minutes.  Had ID # of [employer], but placed in shredder, unable to find key to get paper out.  Then for the remainder of interview Doretha Imus of [employer] came to interpret for pt. She is at bedside at this time.

## 2018-02-08 NOTE — Anesthesia Procedure Notes (Signed)
Procedure Name: Intubation Date/Time: 02/08/2018 7:36 AM Performed by: Victoriano Lain, CRNA Pre-anesthesia Checklist: Patient identified, Emergency Drugs available, Suction available, Patient being monitored and Timeout performed Patient Re-evaluated:Patient Re-evaluated prior to induction Oxygen Delivery Method: Circle system utilized Preoxygenation: Pre-oxygenation with 100% oxygen Induction Type: IV induction Ventilation: Mask ventilation without difficulty Laryngoscope Size: Mac and 3 Grade View: Grade I Tube type: Oral Tube size: 7.0 mm Number of attempts: 1 Airway Equipment and Method: Stylet Placement Confirmation: ETT inserted through vocal cords under direct vision,  positive ETCO2 and breath sounds checked- equal and bilateral Secured at: 20 cm Tube secured with: Tape Dental Injury: Teeth and Oropharynx as per pre-operative assessment

## 2018-02-09 ENCOUNTER — Encounter (HOSPITAL_COMMUNITY): Payer: Self-pay | Admitting: Gynecologic Oncology

## 2018-02-09 DIAGNOSIS — C539 Malignant neoplasm of cervix uteri, unspecified: Secondary | ICD-10-CM | POA: Diagnosis not present

## 2018-02-09 LAB — BASIC METABOLIC PANEL
Anion gap: 5 (ref 5–15)
BUN: 13 mg/dL (ref 6–20)
CHLORIDE: 109 mmol/L (ref 98–111)
CO2: 25 mmol/L (ref 22–32)
Calcium: 8.2 mg/dL — ABNORMAL LOW (ref 8.9–10.3)
Creatinine, Ser: 0.63 mg/dL (ref 0.44–1.00)
GFR calc non Af Amer: >60 mL/min (ref >60)
Glucose, Bld: 133 mg/dL — ABNORMAL HIGH (ref 70–99)
POTASSIUM: 3.8 mmol/L (ref 3.5–5.1)
SODIUM: 139 mmol/L (ref 135–145)

## 2018-02-09 LAB — HEMOGLOBIN AND HEMATOCRIT, BLOOD
HEMATOCRIT: 30.3 % — AB (ref 36.0–46.0)
Hemoglobin: 10.2 g/dL — ABNORMAL LOW (ref 12.0–15.0)

## 2018-02-09 LAB — CBC
HCT: 29.2 % — ABNORMAL LOW (ref 36.0–46.0)
Hemoglobin: 9.7 g/dL — ABNORMAL LOW (ref 12.0–15.0)
MCH: 30.3 pg (ref 26.0–34.0)
MCHC: 33.2 g/dL (ref 30.0–36.0)
MCV: 91.3 fL (ref 78.0–100.0)
Platelets: 245 10*3/uL (ref 150–400)
RBC: 3.2 MIL/uL — ABNORMAL LOW (ref 3.87–5.11)
RDW: 13.4 % (ref 11.5–15.5)
WBC: 8.1 10*3/uL (ref 4.0–10.5)

## 2018-02-09 MED ORDER — OXYCODONE-ACETAMINOPHEN 5-325 MG PO TABS: 1.0000 | ORAL_TABLET | ORAL | 0 refills | Status: DC | PRN

## 2018-02-09 MED ORDER — SODIUM CHLORIDE 0.9 % IV BOLUS
500.0000 mL | Freq: Once | INTRAVENOUS | Status: AC
  Administered 2018-02-09: 500 mL via INTRAVENOUS

## 2018-02-09 MED ORDER — DOCUSATE SODIUM 100 MG PO CAPS: 100.0000 mg | ORAL_CAPSULE | Freq: Two times a day (BID) | ORAL | 0 refills | Status: DC

## 2018-02-09 NOTE — Progress Notes (Signed)
1 Day Post-Op Procedure(s) (LRB): XI ROBOTIC ASSISTED TOTAL Radical HYSTERECTOMY WITH BILATERAL SALPINGO OOPHORECTOMY (N/A) BILATEAL PEVIC  LYMPHADENECTOMY (Bilateral)  Subjective: Patient reports no pain at this time.  Ambulating with assist without difficulty.  No nausea or emesis reported.  Stating she has not ordered breakfast because she does not speak fluent [place].  No dizziness, lightheadedness, dyspnea, chest pain.  Passing flatus.  No concerns voiced.  She is aware she will be going home with the catheter in her bladder and advised the nursing staff will instruct her on care.     Objective: Vital signs in last 24 hours: Temp:  [97.4 F (36.3 C)-99.1 F (37.3 C)] 98.8 F (37.1 C) (07/09 0527) Pulse Rate:  [65-99] 67 (07/09 0527) Resp:  [11-18] 15 (07/09 0527) BP: (80-122)/(47-74) 93/58 (07/09 0527) SpO2:  [96 %-100 %] 98 % (07/09 0527) Weight:  [156 lb 15.5 oz (71.2 kg)-161 lb 6 oz (73.2 kg)] 161 lb 6 oz (73.2 kg) (07/09 0527) Last BM Date: 02/06/18  Intake/Output from previous day: 07/08 0701 - 07/09 0700 In: 3570 [P.O.:980; I.V.:2490; IV Piggyback:100] Out: 2150 [Urine:2050; Blood:100]  Physical Examination: General: alert, cooperative and no distress Resp: clear to auscultation bilaterally Cardio: regular rate and rhythm, S1, S2 normal, no murmur, click, rub or gallop GI: soft, non-tender; bowel sounds normal; no masses,  no organomegaly and incision: lap sites to the abdomen with dermabond without drainage Extremities: extremities normal, atraumatic, no cyanosis or edema  Foley to straight drain with clear, yellow urine present  Labs: WBC/Hgb/Hct/Plts:  8.1/9.7/29.2/245 (07/09 0532) BUN/Cr/glu/ALT/AST/amyl/lip:  13/0.63/--/--/--/--/-- (07/09 0532)  Assessment: 57 y.o. s/p Procedure(s): XI ROBOTIC ASSISTED TOTAL Radical HYSTERECTOMY WITH BILATERAL SALPINGO OOPHORECTOMY BILATEAL PEVIC  LYMPHADENECTOMY: stable Pain:  Pain is well-controlled on PRN  medications.  Heme: Appropriate POD 1: Hgb 9.7 and Hct 29.2.  CV: Hypotensive but asymptomatic.  Bolus given earlier this am.  GI:  Tolerating po: Yes.  Antiemetics ordered PRN.  GU: Foley in place draining adequate urine.    FEN: Appropriate POD1.   Prophylaxis: pharmacologic prophylaxis (with any of the following: Lovenox) and intermittent pneumatic compression boots.  Plan: Solid food ordered for patient Encourage ambulation, IS use, deep breathing, and coughing Foley care/leg bag instructions per nursing staff Continue plan of care per Dr. Phelps/Dr/ J-Moore Plan for probable discharge later today    LOS: 0 days    Kellianne Ek D Saahas Hidrogo 02/09/2018, 10:06 AM

## 2018-02-09 NOTE — Progress Notes (Addendum)
Patient's BP was 86/52 manually taken. She is A&Ox4. Doctor Gerarda Fraction was paged and gave an oral order of a 500cc bolus of NS and a STAT hemoglobin and hematocrit. Will continue to monitor patient.

## 2018-02-09 NOTE — Progress Notes (Signed)
Patient given discharge instructions and information on where to pick up her prescriptions.

## 2018-02-09 NOTE — Discharge Summary (Signed)
Physician Discharge Summary  Patient ID: Amy Morrow MRN: [id number] DOB/AGE: [birth year] 56 y.o.  Admit date: 02/08/2018 Discharge date: 02/09/2018  Admission Diagnoses: Cervical cancer, FIGO stage IB2 [facility])  Discharge Diagnoses:  Principal Problem:   Cervical cancer, FIGO stage IB2 ([place]) Active Problems:   Cervical cancer, FIGO stage IB1 ([place])   Discharged Condition: stable  Hospital Course:  1/ patient was admitted on 02/08/18 for a robotic assisted laparoscopic radical hysterectomy, BSO, and lymph node dissection 2/ surgery was uncomplicated  3/ on postoperative day 1 the patient was meeting discharge criteria: tolerating PO, voiding urine, ambulating, pain well controlled on oral medications,  4/ new medications on discharge include Percocet and Colace.    Consults: None  Significant Diagnostic Studies: none  Treatments: surgery:   Discharge Exam: Blood pressure 109/64, pulse 73, temperature 99 F (37.2 C), temperature source Oral, resp. rate 18, height 4\' 11"  (1.499 m), weight 161 lb 6 oz (73.2 kg), last menstrual period 11/16/2012, SpO2 100 %. General appearance: alert, cooperative and no distress Resp: nonlabored GI: soft, NTND. Skin: Skin color, texture, turgor normal. No rashes or lesions Incision/Wound: CDI Ext no edema, NT  Disposition: Discharge disposition: 01-Home or Self Care       Discharge Instructions    Call MD for:  persistant nausea and vomiting   Complete by:  As directed    Call MD for:  temperature >100.4   Complete by:  As directed    Diet - low sodium heart healthy   Complete by:  As directed    Increase activity slowly   Complete by:  As directed    Urinary leg bag   Complete by:  As directed      Allergies as of 02/09/2018   No Known Allergies     Medication List    TAKE these medications   docusate sodium 100 MG capsule Commonly known as:  COLACE Take 1 capsule (100 mg total) by mouth 2 (two) times daily.    oxyCODONE-acetaminophen 5-325 MG tablet Commonly known as:  PERCOCET/ROXICET Take 1 tablet by mouth every 4 (four) hours as needed for moderate pain.        Signed: Isabel Caprice 02/09/2018, 12:52 PM

## 2018-02-09 NOTE — Care Management Note (Signed)
Case Management Note  Patient Details  Name: Amy Morrow MRN: [id number] Date of Birth: 1960-09-24  Subjective/Objective:  Pt admitted with Cervical Cancer,            Action/Plan:  Plan to discharge home with no needs at present time.    Expected Discharge Date:  02/09/18               Expected Discharge Plan:  Home/Self Care  In-House Referral:     Discharge planning Services  CM Consult  Post Acute Care Choice:    Choice offered to:     DME Arranged:    DME Agency:     HH Arranged:    HH Agency:     Status of Service:  Completed, signed off  If discussed at [employer] of Stay Meetings, dates discussed:    Additional CommentsPurcell Mouton, RN 02/09/2018, 1:18 PM

## 2018-02-09 NOTE — Discharge Instructions (Signed)
02/08/2018  Return to work: 6 weeks  Activity: 1. Be up and out of the bed during the day.  Take a nap if needed.  You may walk up steps but be careful and use the hand rail.  Stair climbing will tire you more than you think, you may need to stop part way and rest.   2. No lifting or straining for 6 weeks.  3. No driving for 2 weeks.  Do Not drive if you are taking narcotic pain medicine.  4. Shower daily.  Use soap and water on your incision and pat dry; don't rub.   5. No sexual activity and nothing in the vagina for 10 weeks.  Medications:  - Take ibuprofen and tylenol first line for pain control. Take these regularly (every 6 hours) to decrease the build up of pain.  - If necessary, for severe pain not relieved by ibuprofen, take percocet.  - While taking percocet you should take sennakot every night to reduce the likelihood of constipation. If this causes diarrhea, stop its use.  Diet: 1. Low sodium Heart Healthy Diet is recommended.  2. It is safe to use a laxative if you have difficulty moving your bowels.   Wound Care: 1. Keep clean and dry.  Shower daily.  Reasons to call the Doctor:   Fever - Oral temperature greater than 100.4 degrees Fahrenheit  Foul-smelling vaginal discharge  Difficulty urinating  Nausea and vomiting  Increased pain at the site of the incision that is unrelieved with pain medicine.  Difficulty breathing with or without chest pain  New calf pain especially if only on one side  Sudden, continuing increased vaginal bleeding with or without clots.   Follow-up: 1. See our office in 1 week for foley removal and 3 weeks for a cuff check.  Contacts: For questions or concerns you should contact:  [facility] office (Dr. [name]/Tayte Childers) [phone] After hours and on week-ends call [phone] and ask to speak to the physician on call for Gynecologic Oncology  Histerectoma total laparoscpica, [place] (Total  Laparoscopic Hysterectomy, Care After) Siga estas instrucciones durante las prximas semanas. Estas indicaciones le proporcionan informacin acerca de cmo deber cuidarse despus del procedimiento. El mdico tambin podr darle instrucciones ms especficas. El tratamiento se ha planificado de acuerdo a las prcticas mdicas actuales, pero a veces se producen problemas. Comunquese con el mdico si tiene algn problema o tiene dudas despus del procedimiento. QU ESPERAR DESPUS DEL PROCEDIMIENTO  Dolor y hematomas en los sitios de incisin. Le darn analgsicos para Financial controller.  Podr tener sntomas de menopausia, como calor repentino, sudoraciones nocturnas e insomnio si le han extirpado los ovarios.  Podr sentir dolor de garganta por el tubo del respirador que le colocaron durante la [name].  INSTRUCCIONES PARA EL CUIDADO EN EL HOGAR  Utilice los medicamentos de venta libre o recetados para Glass blower/designer, el malestar o la fiebre, segn se lo indique el mdico.  No tome aspirina. Puede ocasionar hemorragias.  No conduzca mientras est tomando analgsicos.  Siga las indicaciones de su mdico con respecto a la dieta, la actividad fsica, levantar objetos, conducir y para las actividades en general.  Reanude su dieta habitual, segn las indicaciones y los permisos.  Descanse y duerma lo suficiente.  Nose haga duchas vaginales, no utilice tampones ni tenga relaciones sexuales durante al menos 10 semanas o hasta que el profesional la autorice.  Cambie los apsitos (vendajes) tal como le indic su mdico.  Controle su temperatura y notifique a su mdico si tiene fiebre.  Tome duchas en lugar de baos durante 2 a 3 semanas.  No beba alcohol hasta que el mdico la autorice.  Si est estreida, tome un laxante suave, si el mdico la [place]. Los alimentos que contienen salvado la ayudarn para el problema de la estreimiento. Debe ingerir la cantidad de lquidos suficientes  para Theatre manager la orina de tono claro o color amarillo plido.  Trate de que alguien la acompae en su casa durante 1 o 2semanas, para ayudarla con los [employer].  Cumpla con todas las visitas de control, segn le indique su mdico.  SOLICITE ATENCIN MDICA SI:  Observa enrojecimiento, hinchazn o siente dolor cada vez ms intenso en los sitios de las incisiones.  Tiene pus en el sitio de la incisin.  Advierte un olor feo que proviene de la incisin.  La incisin se abre.  Se siente mareada o sufre un desmayo.  Siente dolor o tiene una hemorragia al [employer].  Tiene diarrea persistente.  Tiene nuseas o vmitos persistentes.  Tiene flujo vaginal anormal.  Tiene una erupcin cutnea.  Tiene alguna reaccin anormal o aparece una alergia por los medicamentos.  El dolor no se alivia con los medicamentos recetados.  SOLICITE ATENCIN MDICA DE INMEDIATO SI:  Siente falta de aire o dolor en el pecho.  Siente dolor intenso abdominal que no se alivia con los [employer].  Presenta dolor o hinchazn en las piernas.  ASEGRESE DE QUE:  Comprende estas instrucciones.  Controlar su afeccin.  Recibir ayuda de inmediato si no mejora o si empeora.  Esta informacin no tiene Marine scientist el consejo del mdico. Asegrese de hacerle al mdico cualquier pregunta que tenga. Document Released: 05/11/2013 Document Revised: 07/26/2013 Document Reviewed: 02/08/2013 Elsevier Interactive Patient Education  2017 [place] de cuello del tero (Cervical Cancer) El cuello del tero es la abertura y la parte inferior del tero, que se encuentra entre la vagina y Nurse, learning disability. El cncer de cuello del tero es un tipo de tumor bastante frecuente. Ocurre con ms frecuencia en mujeres entre los 40 y los 38 aos. Las clulas del cuello del tero actan en gran parte como las clulas de la piel. Estas clulas estn expuestas a toxinas, virus y bacterias que pueden  causar modificaciones anormales. Hay dos tipos de cncer de cuello del tero:  Carcinoma de clulas escamosas. Este tipo de cncer se inicia en las clulas planas, similares a escamas, que recubren el cuello del tero. El carcinoma de clulas escamosas puede desarrollarse por una infeccin transmitida sexualmente causada por el virus del papiloma humano (VPH).  Adenocarcinoma. Este tipo de cncer se inicia en las clulas glandulares que recubren el cuello del tero. [place] riesgo de sufrir cncer de cuello del tero se relaciona con el estilo de vida, la historia sexual, la salud y el sistema inmunitario. Los riesgos de cncer de cuello del tero incluyen:  Tener una infeccin viral de transmisin sexual. Estas pueden ser: ? Clamidia. ? Herpes. ? VPH.  Ser sexualmente activa antes de los 18 aos.  Tener ms de una pareja sexual o tener sexo con alguien que tenga ms de una pareja sexual.  No usar condones con sus Advertising copywriter.  Haber sufrido cncer en la vagina o en la vulva.  Tener una pareja sexual que tenga o haya tenido cncer de pene o que haya tenido una pareja sexual con clulas anormales del cuello del tero (  displasia) o cncer de cuello del tero.  Usar anticonceptivos orales (tambin llamados pldoras para el control de la natalidad).  El hbito de fumar.  Tener un sistema inmunitario debilitado. Por ejemplo padecer el virus de inmunodeficiencia humano (VIH) u otros trastornos de dficit inmunitario.  Ser hija de una mujer que tom dietilestilbestrol (DES) durante el embarazo.  Tener una hermana o madre que hayan sufrido cncer de cuello del tero.  Pertenecer a la Mining engineer, hispana, asitica o ser [employer] de las [place].  Historia de displasia en el cuello del tero. [place] sntomas generalmente no estn presentes en las primeras etapas del cncer de cuello del tero. Una vez que el cncer invade el cuello del  tero y los tejidos que lo rodean, la mujer puede tener:  Sangrado vaginal anormal o sangrado menstrual que es ms prolongado o intenso que lo habitual.  Therapist, art despus de Office manager sexuales, de hacerse una ducha vaginal o un Papanicolaou.  Sangrado vaginal luego de la menopausia.  Flujo vaginal anormal.  Molestias o dolor plvico.  Papanicolaou anormal.  Dolor durante las relaciones sexuales. Los sntomas de cncer de cuello del tero ms avanzado pueden incluir:  Prdida del apetito o prdida de peso.  Cansancio (fatiga).  Dolor en la espalda y las piernas.  Incapacidad de Aeronautical engineer orina o los movimientos intestinales. DIAGNSTICO Se indicar un examen plvico y un Papanicolaou para diagnosticar la enfermedad. Si se encuentran anormalidades durante el Papanicolaou, deber repetirse a los 3 meses, o el mdico podr indicar estudios o procedimientos adicionales como:  [place]. Este procedimiento [place] un microscopio especial que permite al [employer] aumentar y examinar de cerca las clulas del cuello del tero, la vagina y la vulva.  Biopsias cervicales. Es un procedimiento en el que se toman pequeas muestras de tejido del cuello del tero para que un especialista las examine en un microscopio.  Biopsia en cono. Este procedimiento se realiza para Chief of Staff o para extirpar tejido canceroso. Puede ser necesario realizar ms pruebas, por ejemplo:  Una cistoscopa.  Proctoscopa o sigmoideoscopa.  Ecografas.  Tomografa computarizada.  Resonancia magntica.  Laparoscopia. El cncer de cuello del tero tiene diferentes etapas:  Etapa 0, carcinoma in situ (CIS): esta es la primera etapa del cncer y es la ltima etapa y la ms grave de la displasia.  Etapa I: significa que el tumor se encuentra nicamente en el tero y en el cuello del tero.  Etapa II: significa que el tumor se ha diseminado hacia la zona superior de la vagina. El cncer se ha diseminado  ms all del tero, pero no a las paredes de la pelvis o hacia el tercio inferior de la vagina.  Etapa III: significa que el tumor ha invadido las paredes laterales de la pelvis y el tercio inferior de la vagina. Si el tumor obstruye los conductos que llevan la orina a la vejiga (urteres), puede hacer que la orina vuelva hacia arriba y los riones se hinchen (hidronefrosis).  Etapa IV: significa que el tumor se ha diseminado hacia el recto o la vejiga. En la parte posterior de esta etapa, puede diseminarse a rganos distantes, como los pulmones. [place] opciones de tratamiento son:  Biopsia en cono para retirar el tejido canceroso.  Extirpacin del todo el tero y el cuello del tero.  Extirpacin del tero, el cuello del tero, la porcin superior de la vagina, los ganglios linfticos y el tejido que los rodea (histerectoma radical modificada). Los ovarios  puede dejarse o ser extirpados.  Medicamentos para tratar Science writer.  Una combinacin de [name], radiacin y quimioterapia.  Modificadores de Chartered loss adjuster. Estas son sustancias que ayudan a Art therapist inmunitario en su lucha contra el cncer o las infecciones. Pueden utilizarse en combinacin con la quimioterapia. INSTRUCCIONES PARA EL CUIDADO EN EL HOGAR  Hgase un examen ginecolgico y un Papanicolaou una vez por ao, o segn las indicaciones de su mdico.  Aplquese la vacuna contra el VPH.  No fume.  No tenga relaciones sexuales hasta que el mdico la autorice.  Use un condn cada vez que tenga [employer].  SOLICITE ATENCIN MDICA SI:  Aumenta el dolor abdominal o plvico.  Se siente cada vez ms cansada.  Aumenta el dolor en las piernas o la espalda.  Tiene fiebre.  Observa una hemorragia o secrecin vaginal anormal.  Pierde peso.  SOLICITE ATENCIN MDICA DE INMEDIATO SI:  No puede orinar.  Observa sangre en la orina.  Lollie Marrow o siente presin al mover el  intestino.  Comienza a sentir dolor intenso en la espalda, el estmago o la pelvis.  Esta informacin no tiene Marine scientist el consejo del mdico. Asegrese de hacerle al mdico cualquier pregunta que tenga. Document Released: 11/15/2012 Document Revised: 07/26/2013 Document Reviewed: 01/12/2013 Elsevier Interactive Patient Education  2017 [name].

## 2018-02-09 NOTE — Progress Notes (Addendum)
Patient educated on foley care and leg bag use after discharge. I had the patient demonstrate how to attach the foley bag and preform foley care to confirm that she understood. The patient expresses no further questions or concerns.

## 2018-02-09 NOTE — Progress Notes (Addendum)
Patient was given 500 bolus of normal saline and BP was rechecked it is 99/54. Her hemoglobin and hematocrit are stable. Doctor Gerarda Fraction was paged and updated. RN will continue to monitor.

## 2018-02-11 ENCOUNTER — Emergency Department (HOSPITAL_COMMUNITY)
Admission: EM | Admit: 2018-02-11 | Discharge: 2018-02-11 | Disposition: A | Discharge status: Home / Self Care | Payer: Medicaid Other | Attending: Emergency Medicine | Admitting: Emergency Medicine

## 2018-02-11 ENCOUNTER — Emergency Department (HOSPITAL_COMMUNITY): Payer: Medicaid Other

## 2018-02-11 ENCOUNTER — Encounter (HOSPITAL_COMMUNITY): Payer: Self-pay

## 2018-02-11 ENCOUNTER — Other Ambulatory Visit: Payer: Self-pay

## 2018-02-11 DIAGNOSIS — Z90710 Acquired absence of both cervix and uterus: Secondary | ICD-10-CM | POA: Diagnosis not present

## 2018-02-11 DIAGNOSIS — Z8541 Personal history of malignant neoplasm of cervix uteri: Secondary | ICD-10-CM | POA: Diagnosis not present

## 2018-02-11 DIAGNOSIS — Z960 Presence of urogenital implants: Secondary | ICD-10-CM | POA: Insufficient documentation

## 2018-02-11 DIAGNOSIS — R51 Headache: Secondary | ICD-10-CM | POA: Diagnosis present

## 2018-02-11 DIAGNOSIS — R112 Nausea with vomiting, unspecified: Secondary | ICD-10-CM | POA: Insufficient documentation

## 2018-02-11 DIAGNOSIS — R509 Fever, unspecified: Secondary | ICD-10-CM | POA: Insufficient documentation

## 2018-02-11 DIAGNOSIS — R519 Headache, unspecified: Secondary | ICD-10-CM

## 2018-02-11 LAB — URINALYSIS, ROUTINE W REFLEX MICROSCOPIC
Bilirubin Urine: NEGATIVE
Glucose, UA: 50 mg/dL — AB
Ketones, ur: NEGATIVE mg/dL
Leukocytes, UA: NEGATIVE
Nitrite: NEGATIVE
PH: 7 (ref 5.0–8.0)
Protein, ur: NEGATIVE mg/dL
Specific Gravity, Urine: 1.006 (ref 1.005–1.030)

## 2018-02-11 LAB — CBC WITH DIFFERENTIAL/PLATELET
BASOS PCT: 0 %
Basophils Absolute: 0 10*3/uL (ref 0.0–0.1)
EOS ABS: 0 10*3/uL (ref 0.0–0.7)
Eosinophils Relative: 0 %
HEMATOCRIT: 32.1 % — AB (ref 36.0–46.0)
HEMOGLOBIN: 10.9 g/dL — AB (ref 12.0–15.0)
LYMPHS ABS: 1 10*3/uL (ref 0.7–4.0)
Lymphocytes Relative: 7 %
MCH: 31 pg (ref 26.0–34.0)
MCHC: 34 g/dL (ref 30.0–36.0)
MCV: 91.2 fL (ref 78.0–100.0)
MONOS PCT: 5 %
Monocytes Absolute: 0.7 10*3/uL (ref 0.1–1.0)
Neutro Abs: 13 10*3/uL — ABNORMAL HIGH (ref 1.7–7.7)
Neutrophils Relative %: 88 %
Platelets: 259 10*3/uL (ref 150–400)
RBC: 3.52 MIL/uL — ABNORMAL LOW (ref 3.87–5.11)
RDW: 13.9 % (ref 11.5–15.5)
WBC: 14.7 10*3/uL — AB (ref 4.0–10.5)

## 2018-02-11 LAB — I-STAT BETA HCG BLOOD, ED (MC, WL, AP ONLY): I-stat hCG, quantitative: 16.4 m[IU]/mL — ABNORMAL HIGH (ref <5)

## 2018-02-11 LAB — COMPREHENSIVE METABOLIC PANEL
ALT: 26 U/L (ref 0–44)
AST: 28 U/L (ref 15–41)
Albumin: 3.1 g/dL — ABNORMAL LOW (ref 3.5–5.0)
Alkaline Phosphatase: 65 U/L (ref 38–126)
Anion gap: 7 (ref 5–15)
BUN: 10 mg/dL (ref 6–20)
CHLORIDE: 98 mmol/L (ref 98–111)
CO2: 28 mmol/L (ref 22–32)
CREATININE: 0.63 mg/dL (ref 0.44–1.00)
Calcium: 8.6 mg/dL — ABNORMAL LOW (ref 8.9–10.3)
GFR calc non Af Amer: >60 mL/min (ref >60)
Glucose, Bld: 169 mg/dL — ABNORMAL HIGH (ref 70–99)
Potassium: 3.5 mmol/L (ref 3.5–5.1)
SODIUM: 133 mmol/L — AB (ref 135–145)
Total Bilirubin: 1 mg/dL (ref 0.3–1.2)
Total Protein: 6.7 g/dL (ref 6.5–8.1)

## 2018-02-11 LAB — I-STAT CG4 LACTIC ACID, ED: Lactic Acid, Venous: 1.45 mmol/L (ref 0.5–1.9)

## 2018-02-11 MED ORDER — ACETAMINOPHEN 325 MG PO TABS
325.0000 mg | ORAL_TABLET | Freq: Once | ORAL | Status: AC
  Administered 2018-02-11: 325 mg via ORAL
  Filled 2018-02-11: qty 1

## 2018-02-11 MED ORDER — SODIUM CHLORIDE 0.9 % IV BOLUS
1000.0000 mL | Freq: Once | INTRAVENOUS | Status: AC
  Administered 2018-02-11: 1000 mL via INTRAVENOUS

## 2018-02-11 NOTE — Discharge Instructions (Addendum)
Home to rest, push fluids. Contact your doctor's office for follow-up, return to the emergency room for worsening or concerning symptoms. Your pain medication has Tylenol in it, if you need additional Tylenol for your fever you may take 1- 325 mg tablet of Tylenol in addition to your pain medication.

## 2018-02-11 NOTE — ED Provider Notes (Signed)
[place] DEPT Provider Note   CSN: [id number] Arrival date & time: 02/11/18  1642     History   Chief Complaint Chief Complaint  Patient presents with  . Headache  . Fever    HPI Amy Morrow is a 57 y.o. female.  57yo female presents with complaint of tactile fever and headache. Symptoms started 02/09/18, headache located frontal and parietal areas, intermittent, improves with Tylenol, last took 500mg  Tylenol at [facility] today. Patient had a laparoscopic radical hysterectomy with BSO and lymph node dissection 02/08/18, dc home on 02/09/18 with a foley catheter in place. Reports her incision sites are healing well, denies abdominal pain, is taking 5/325 percocet for her pain. Patient had 1 episode of vomiting yesterday, none since, denies changes in bowel habits. No known sick contacts, denies cough, congestion, rash, body aches. No other complaints or concerns.      Past Medical History:  Diagnosis Date  . Abnormal Pap smear   . Anemia   . Cervical cancer [facility]) oncologist- dr Denman George   Stage IB1  SCCa   . Hyperlipidemia   . Urgency of urination    intermittant    Patient Active Problem List   Diagnosis Date Noted  . Cervical cancer, FIGO stage IB2 ([place]) 02/08/2018  . Cervical cancer, FIGO stage IB1 ([place]) 02/08/2018  . Abnormal uterine bleeding (AUB) 12/07/2017  . Neuropathic pain 02/06/2014  . Lichen planus 89/[id number]/9450  . Dysuria 01/25/2013  . Cervical cancer ([place]) 07/29/2012  . Perimenopause 07/29/2012  . H/O burning pain in leg 06/24/2012  . CIN I (cervical intraepithelial neoplasia I) 07/11/2011  . LSIL (low grade squamous intraepithelial lesion) on Pap smear 06/11/2011    Past Surgical History:  Procedure Laterality Date  . D & C LEEP CONIZATION BX  07-31-2003   dr p. rose  [facility]  . PELVIC LYMPH NODE DISSECTION Bilateral 02/08/2018   Procedure: BILATEAL PEVIC  LYMPHADENECTOMY;  Surgeon: [name], MD;  Location: [facility] ORS;  Service:  Gynecology;  Laterality: Bilateral;  . ROBOTIC ASSISTED TOTAL HYSTERECTOMY WITH BILATERAL SALPINGO OOPHERECTOMY N/A 02/08/2018   Procedure: XI ROBOTIC ASSISTED TOTAL Radical HYSTERECTOMY WITH BILATERAL SALPINGO OOPHORECTOMY;  Surgeon: [name], MD;  Location: [facility] ORS;  Service: Gynecology;  Laterality: N/A;  . Transvaginal tape placement  03-12-2009  dr Emeterio Reeve  [facility]   GYNECARE TENSION-FREE VAGINAL TAPE SLING  . TUBAL LIGATION  05-28-2005  DR  MARSHALL  @ [place]   PPTL     OB History    Gravida  6   Para  5   Term  5   Preterm  0   AB  1   Living  5     SAB  1   TAB  0   Ectopic  0   Multiple  0   Live Births  5            Home Medications    Prior to Admission medications   Medication Sig Start Date End Date Taking? Authorizing Provider  acetaminophen (TYLENOL) 500 MG tablet Take 500 mg by mouth every 6 (six) hours as needed for fever.   Yes [provider]  docusate sodium (COLACE) 100 MG capsule Take 1 capsule (100 mg total) by mouth 2 (two) times daily. 02/09/18  Yes [name], MD  oxyCODONE-acetaminophen (PERCOCET/ROXICET) 5-325 MG tablet Take 1 tablet by mouth every 4 (four) hours as needed for moderate pain. 02/09/18  Yes Isabel Caprice, MD  Family History Family History  Problem Relation Age of Onset  . Hypertension Mother   . Heart disease Mother   . Hypertension Brother   . Heart disease Brother     Social History Social History   Tobacco Use  . Smoking status: Never Smoker  . Smokeless tobacco: Never Used  Substance Use Topics  . Alcohol use: No  . Drug use: No     Allergies   Patient has no known allergies.   Review of Systems Review of Systems  Constitutional: Positive for fever. Negative for chills.  HENT: Negative for congestion.   Respiratory: Negative for cough and shortness of breath.   Cardiovascular: Negative for chest pain.  Gastrointestinal: Positive for nausea and vomiting. Negative for abdominal  pain, constipation and diarrhea.  Genitourinary:       Foley in place with clear urine output  Musculoskeletal: Negative for arthralgias, back pain, myalgias, neck pain and neck stiffness.  Skin: Negative for rash.  Allergic/Immunologic: Negative for immunocompromised state.  Neurological: Positive for headaches. Negative for dizziness and weakness.  Hematological: Negative for adenopathy.  Psychiatric/Behavioral: Negative for confusion.  All other systems reviewed and are negative.    Physical Exam Updated Vital Signs BP 119/72 (BP Location: Right Arm)   Pulse (!) 108   Temp 100.2 F (37.9 C) (Oral)   Resp 18   Ht 4\' 11"  (1.499 m)   Wt 73 kg (161 lb)   LMP 11/16/2012   SpO2 96%   BMI 32.52 kg/m   Physical Exam  Constitutional: She is oriented to person, place, and time. She appears well-developed and well-nourished.  Non-toxic appearance. She does not appear ill. No distress.  HENT:  Head: Normocephalic and atraumatic.  Mouth/Throat: Oropharynx is clear and moist.  Eyes: Pupils are equal, round, and reactive to light.  Neck: Normal range of motion. Neck supple. No neck rigidity.  Cardiovascular: Regular rhythm and intact distal pulses. Tachycardia present.  No murmur heard. Pulmonary/Chest: Effort normal and breath sounds normal. No respiratory distress.  Abdominal: Soft. There is tenderness.  Tender at incision sites, incision sites healing well- no drainage or signs of infection   Musculoskeletal: She exhibits no tenderness.  Lymphadenopathy:    She has no cervical adenopathy.  Neurological: She is alert and oriented to person, place, and time.  Skin: Skin is warm and dry. Capillary refill takes less than 2 seconds. No rash noted.  Psychiatric: She has a normal mood and affect. Her behavior is normal. Her mood appears not anxious. She is not agitated.  Nursing note and vitals reviewed.    ED Treatments / Results  Labs (all labs ordered are listed, but only  abnormal results are displayed) Labs Reviewed  CBC WITH DIFFERENTIAL/PLATELET - Abnormal; Notable for the following components:      Result Value   WBC 14.7 (*)    RBC 3.52 (*)    Hemoglobin 10.9 (*)    HCT 32.1 (*)    Neutro Abs 13.0 (*)    All other components within normal limits  URINALYSIS, ROUTINE W REFLEX MICROSCOPIC - Abnormal; Notable for the following components:   Glucose, UA 50 (*)    Hgb urine dipstick LARGE (*)    Bacteria, UA RARE (*)    All other components within normal limits  COMPREHENSIVE METABOLIC PANEL - Abnormal; Notable for the following components:   Sodium 133 (*)    Glucose, Bld 169 (*)    Calcium 8.6 (*)    Albumin 3.1 (*)  All other components within normal limits  I-STAT BETA HCG BLOOD, ED ([facility], [facility], [facility] ONLY) - Abnormal; Notable for the following components:   I-stat hCG, quantitative 16.4 (*)    All other components within normal limits  CULTURE, BLOOD (ROUTINE X 2)  CULTURE, BLOOD (ROUTINE X 2)  URINE CULTURE  I-STAT CG4 LACTIC ACID, ED  I-STAT CG4 LACTIC ACID, ED    EKG None  Radiology Dg Chest 2 View  Result Date: 02/11/2018 CLINICAL DATA:  Fever and headache. EXAM: CHEST - 2 VIEW COMPARISON:  None. FINDINGS: AP and lateral views obtained. Low volumes. Asymmetric elevation right hemidiaphragm. The cardiopericardial silhouette is within normal limits for size. There is pulmonary vascular congestion without overt pulmonary edema. The visualized bony structures of the thorax are intact. IMPRESSION: Low volume film with vascular congestion. Electronically Signed   By: Misty Stanley M.D.   On: 02/11/2018 19:13    Procedures Procedures (including critical care time)  Medications Ordered in ED Medications  acetaminophen (TYLENOL) tablet 325 mg (has no administration in time range)  sodium chloride 0.9 % bolus 1,000 mL (0 mLs Intravenous Stopped 02/11/18 1940)     Initial Impression / Assessment and Plan / ED Course  I have reviewed the  triage vital signs and the nursing notes.  Pertinent labs & imaging results that were available during my care of the patient were reviewed by me and considered in my medical decision making (see chart for details).  Clinical Course as of Feb 12 1955  Thu Feb 11, 8989  486 57 year old female presents with complaint of tactile fever and headache.  Patient is 3 days postop from radical hysterectomy for cervical cancer, has a Foley catheter in place.  Fever was not documented at home, last had Tylenol at 12:00 and highest fever recorded in the emergency room almost 8 hours after last on a dose is 100.2 oral.  Patient is mildly tachycardic, improved with 1 L of fluids.  Urinalysis shows large amount of blood, no evidence of urinary tract infection at this time.  CBC with increased white count at 14.7, lactic acid is normal, chemistry is unremarkable.  Discussed with Dr. Venora Maples, ER attending, recommends blood cultures x2 and urine culture and patient may be discharged to follow-up with PCP/surgeon.    [LM]    Clinical Course User Index [LM] Tacy Learn, [name]    Final Clinical Impressions(s) / ED Diagnoses   Final diagnoses:  Acute nonintractable headache, unspecified headache type    ED Discharge Orders    None       Roque Lias 02/11/18 1956    Jola Schmidt, MD 02/11/18 2347

## 2018-02-11 NOTE — ED Triage Notes (Addendum)
Pt had fever and headache since last night. Pt states headache in forehead and back of head.  Pt states she vomited 1x yesterday, and that she is constipated. Pt has been using docusate without relief. Pt also taking percoset for cervical cancer surgical treatment several days ago. Pt has foley in place.

## 2018-02-11 NOTE — ED Notes (Signed)
Changed pt's leg bag to a new bag

## 2018-02-14 LAB — URINE CULTURE: Culture: 70000 — AB

## 2018-02-15 ENCOUNTER — Telehealth: Payer: Self-pay | Admitting: *Deleted

## 2018-02-15 ENCOUNTER — Encounter (HOSPITAL_COMMUNITY): Payer: Self-pay | Admitting: *Deleted

## 2018-02-15 NOTE — Progress Notes (Signed)
ED Antimicrobial Stewardship Positive Culture Follow Up   Amy Morrow is an 57 y.o. female who presented to [facility] on 02/11/2018 with a chief complaint of  Chief Complaint  Patient presents with  . Headache  . Fever    Recent Results (from the past 720 hour(s))  Urine culture     Status: Abnormal   Collection Time: 02/11/18  6:08 PM  Result Value Ref Range Status   Specimen Description   Final    URINE, RANDOM Performed at [facility] [address], [place], [place] [address]    Special Requests   Final    NONE Performed at [facility], [place] [address] [place], [place] [address]    Culture 70,000 COLONIES/mL ENTEROCOCCUS FAECALIS (A)  Final   Report Status 02/14/2018 FINAL  Final   Organism ID, Bacteria ENTEROCOCCUS FAECALIS (A)  Final      Susceptibility   Enterococcus faecalis - MIC*    AMPICILLIN <=2 SENSITIVE Sensitive     LEVOFLOXACIN 1 SENSITIVE Sensitive     NITROFURANTOIN <=16 SENSITIVE Sensitive     VANCOMYCIN 2 SENSITIVE Sensitive     * 70,000 COLONIES/mL ENTEROCOCCUS FAECALIS  Blood culture (routine x 2)     Status: None (Preliminary result)   Collection Time: 02/11/18  8:32 PM  Result Value Ref Range Status   Specimen Description   Final    BLOOD RIGHT ANTECUBITAL Performed at [facility] [address] [place], [place] [address]    Special Requests   Final    BOTTLES DRAWN AEROBIC AND ANAEROBIC Blood Culture adequate volume Performed at [facility] [address], [place], [place] [address]    Culture   Final    NO GROWTH 4 DAYS Performed at [facility], [place] [address] [place], [place] [address]    Report Status PENDING  Incomplete  Blood culture (routine x 2)     Status: None (Preliminary result)   Collection Time: 02/11/18  8:32 PM  Result Value Ref Range Status   Specimen Description   Final    BLOOD LEFT ANTECUBITAL Performed at [facility] [address], [place], [place] [address]    Special Requests   Final    BOTTLES DRAWN AEROBIC AND ANAEROBIC Blood Culture adequate volume Performed at [facility] [address] [place], [place] [address]    Culture   Final    NO GROWTH 4 DAYS Performed at [facility], [place] [address], [place],  [address]    Report Status PENDING  Incomplete    []  Treated with N/A, organism resistant to prescribed antimicrobial [x]  Patient discharged originally without antimicrobial agent and treatment is now indicated  New antibiotic prescription: If pt is symptomatic, start amoxicillin 500mg  PO BID x 7 days  ED Provider: Maury Dus, PA   Amy Morrow, Amy Morrow 02/15/2018, 9:51 AM Clinical Pharmacist Monday - Friday phone -  [phone] Saturday - Sunday phone - [phone]

## 2018-02-15 NOTE — Telephone Encounter (Signed)
Post ED Visit - Positive Culture Follow-up  Culture report reviewed by antimicrobial stewardship pharmacist:  []  Elenor Quinones, Pharm.D. []  Heide Guile, Pharm.D., BCPS AQ-ID []  Parks Neptune, Pharm.D., BCPS []  Alycia Rossetti, Pharm.D., BCPS []  [place], Pharm.D., BCPS, AAHIVP []  Legrand Como, Pharm.D., BCPS, AAHIVP [x]  Salome Arnt, PharmD, BCPS []  Johnnette Gourd, PharmD, BCPS []  Hughes Better, PharmD, BCPS []  Leeroy Cha, PharmD  Positive urine culture Symptom check and states feeling better.  Has MD appointment on 02/16/2018 and no further patient follow-up is required at this time.  Harlon Flor [facility] 02/15/2018, 10:26 AM

## 2018-02-16 ENCOUNTER — Other Ambulatory Visit: Payer: Self-pay

## 2018-02-16 ENCOUNTER — Inpatient Hospital Stay (HOSPITAL_COMMUNITY): Payer: Medicaid Other

## 2018-02-16 ENCOUNTER — Inpatient Hospital Stay (HOSPITAL_BASED_OUTPATIENT_CLINIC_OR_DEPARTMENT_OTHER): Payer: Medicaid Other

## 2018-02-16 ENCOUNTER — Inpatient Hospital Stay: Payer: Medicaid Other | Admitting: Gynecologic Oncology

## 2018-02-16 ENCOUNTER — Encounter (HOSPITAL_COMMUNITY)
Admission: AD | Disposition: A | Discharge status: Home / Self Care | Payer: Self-pay | Source: Ambulatory Visit | Attending: Obstetrics & Gynecology

## 2018-02-16 ENCOUNTER — Inpatient Hospital Stay (HOSPITAL_COMMUNITY): Payer: Medicaid Other | Admitting: Anesthesiology

## 2018-02-16 ENCOUNTER — Inpatient Hospital Stay (HOSPITAL_COMMUNITY)
Admission: AD | Admit: 2018-02-16 | Discharge: 2018-02-18 | DRG: 660 | Disposition: A | Discharge status: Home / Self Care | Payer: Medicaid Other | Source: Ambulatory Visit | Attending: Obstetrics & Gynecology | Admitting: Obstetrics & Gynecology

## 2018-02-16 ENCOUNTER — Inpatient Hospital Stay: Payer: Medicaid Other

## 2018-02-16 ENCOUNTER — Encounter (HOSPITAL_COMMUNITY): Payer: Self-pay

## 2018-02-16 VITALS — BP 126/76 | HR 102 | Temp 99.0°F | Resp 16 | Ht 59.0 in | Wt 144.4 lb

## 2018-02-16 DIAGNOSIS — C539 Malignant neoplasm of cervix uteri, unspecified: Secondary | ICD-10-CM | POA: Diagnosis present

## 2018-02-16 DIAGNOSIS — R000 Tachycardia, unspecified: Secondary | ICD-10-CM

## 2018-02-16 DIAGNOSIS — C531 Malignant neoplasm of exocervix: Secondary | ICD-10-CM

## 2018-02-16 DIAGNOSIS — Z90710 Acquired absence of both cervix and uterus: Secondary | ICD-10-CM

## 2018-02-16 DIAGNOSIS — G8918 Other acute postprocedural pain: Secondary | ICD-10-CM

## 2018-02-16 DIAGNOSIS — K5900 Constipation, unspecified: Secondary | ICD-10-CM

## 2018-02-16 DIAGNOSIS — E785 Hyperlipidemia, unspecified: Secondary | ICD-10-CM | POA: Diagnosis present

## 2018-02-16 DIAGNOSIS — Z79899 Other long term (current) drug therapy: Secondary | ICD-10-CM | POA: Diagnosis not present

## 2018-02-16 DIAGNOSIS — N898 Other specified noninflammatory disorders of vagina: Secondary | ICD-10-CM

## 2018-02-16 DIAGNOSIS — N821 Other female urinary-genital tract fistulae: Secondary | ICD-10-CM | POA: Diagnosis present

## 2018-02-16 DIAGNOSIS — N2889 Other specified disorders of kidney and ureter: Principal | ICD-10-CM | POA: Diagnosis present

## 2018-02-16 DIAGNOSIS — Z90722 Acquired absence of ovaries, bilateral: Secondary | ICD-10-CM

## 2018-02-16 DIAGNOSIS — N888 Other specified noninflammatory disorders of cervix uteri: Secondary | ICD-10-CM | POA: Diagnosis not present

## 2018-02-16 HISTORY — PX: CYSTOSCOPY W/ RETROGRADES: SHX1426

## 2018-02-16 LAB — CULTURE, BLOOD (ROUTINE X 2)
Culture: NO GROWTH
Culture: NO GROWTH
SPECIAL REQUESTS: ADEQUATE
Special Requests: ADEQUATE

## 2018-02-16 LAB — CBC WITH DIFFERENTIAL (CANCER CENTER ONLY)
BASOS ABS: 0 10*3/uL (ref 0.0–0.1)
BASOS PCT: 0 %
EOS ABS: 0.1 10*3/uL (ref 0.0–0.5)
Eosinophils Relative: 0 %
HEMATOCRIT: 30.7 % — AB (ref 34.8–46.6)
HEMOGLOBIN: 10.2 g/dL — AB (ref 11.6–15.9)
Lymphocytes Relative: 14 %
Lymphs Abs: 1.5 10*3/uL (ref 0.9–3.3)
MCH: 30 pg (ref 25.1–34.0)
MCHC: 33.2 g/dL (ref 31.5–36.0)
MCV: 90.3 fL (ref 79.5–101.0)
MONOS PCT: 9 %
Monocytes Absolute: 1 10*3/uL — ABNORMAL HIGH (ref 0.1–0.9)
NEUTROS ABS: 8.6 10*3/uL — AB (ref 1.5–6.5)
NEUTROS PCT: 77 %
Platelet Count: 419 10*3/uL — ABNORMAL HIGH (ref 145–400)
RBC: 3.4 MIL/uL — AB (ref 3.70–5.45)
RDW: 13.5 % (ref 11.2–14.5)
WBC Count: 11.2 10*3/uL — ABNORMAL HIGH (ref 3.9–10.3)

## 2018-02-16 LAB — COMPREHENSIVE METABOLIC PANEL
ALK PHOS: 306 U/L — AB (ref 38–126)
ALT: 197 U/L — ABNORMAL HIGH (ref 0–44)
ANION GAP: 10 (ref 5–15)
AST: 79 U/L — ABNORMAL HIGH (ref 15–41)
Albumin: 3.1 g/dL — ABNORMAL LOW (ref 3.5–5.0)
BUN: 12 mg/dL (ref 6–20)
CALCIUM: 9.5 mg/dL (ref 8.9–10.3)
CO2: 28 mmol/L (ref 22–32)
Chloride: 96 mmol/L — ABNORMAL LOW (ref 98–111)
Creatinine, Ser: 0.69 mg/dL (ref 0.44–1.00)
GFR calc Af Amer: >60 mL/min (ref >60)
GFR calc non Af Amer: >60 mL/min (ref >60)
Glucose, Bld: 99 mg/dL (ref 70–99)
Potassium: 3.8 mmol/L (ref 3.5–5.1)
SODIUM: 134 mmol/L — AB (ref 135–145)
TOTAL PROTEIN: 7.7 g/dL (ref 6.5–8.1)
Total Bilirubin: 0.5 mg/dL (ref 0.3–1.2)

## 2018-02-16 LAB — SURGICAL PCR SCREEN
MRSA, PCR: NEGATIVE
STAPHYLOCOCCUS AUREUS: NEGATIVE

## 2018-02-16 SURGERY — CYSTOSCOPY, WITH RETROGRADE PYELOGRAM
Anesthesia: General | Site: Ureter | Laterality: Bilateral

## 2018-02-16 MED ORDER — IOHEXOL 300 MG/ML  SOLN
INTRAMUSCULAR | Status: DC | PRN
  Administered 2018-02-16: 32 mL via INTRAVENOUS

## 2018-02-16 MED ORDER — SCOPOLAMINE 1 MG/3DAYS TD PT72
MEDICATED_PATCH | TRANSDERMAL | Status: DC | PRN
  Administered 2018-02-16: 1 via TRANSDERMAL

## 2018-02-16 MED ORDER — PROPOFOL 10 MG/ML IV BOLUS
INTRAVENOUS | Status: DC | PRN
  Administered 2018-02-16: 200 mg via INTRAVENOUS

## 2018-02-16 MED ORDER — PIPERACILLIN-TAZOBACTAM 3.375 G IVPB 30 MIN: 3.3750 g | Freq: Four times a day (QID) | INTRAVENOUS | Status: DC

## 2018-02-16 MED ORDER — HYDROMORPHONE HCL 1 MG/ML IJ SOLN: 0.2500 mg | INTRAMUSCULAR | Status: DC | PRN

## 2018-02-16 MED ORDER — LACTATED RINGERS IV SOLN
INTRAVENOUS | Status: DC | PRN
  Administered 2018-02-16: 21:00:00 via INTRAVENOUS

## 2018-02-16 MED ORDER — PROPOFOL 10 MG/ML IV BOLUS
INTRAVENOUS | Status: AC
  Filled 2018-02-16: qty 20

## 2018-02-16 MED ORDER — PIPERACILLIN-TAZOBACTAM 3.375 G IVPB
3.3750 g | Freq: Three times a day (TID) | INTRAVENOUS | Status: DC
  Administered 2018-02-16 – 2018-02-18 (×6): 3.375 g via INTRAVENOUS
  Filled 2018-02-16 (×8): qty 50

## 2018-02-16 MED ORDER — DEXTROSE-NACL 5-0.45 % IV SOLN
INTRAVENOUS | Status: DC
  Administered 2018-02-16 (×2): via INTRAVENOUS

## 2018-02-16 MED ORDER — FENTANYL CITRATE (PF) 100 MCG/2ML IJ SOLN
INTRAMUSCULAR | Status: AC
  Filled 2018-02-16: qty 2

## 2018-02-16 MED ORDER — PHENYLEPHRINE HCL 10 MG/ML IJ SOLN
INTRAMUSCULAR | Status: DC | PRN
  Administered 2018-02-16 (×2): 40 ug via INTRAVENOUS

## 2018-02-16 MED ORDER — MIDAZOLAM HCL 5 MG/5ML IJ SOLN
INTRAMUSCULAR | Status: DC | PRN
  Administered 2018-02-16: 2 mg via INTRAVENOUS

## 2018-02-16 MED ORDER — IOPAMIDOL (ISOVUE-300) INJECTION 61%
100.0000 mL | Freq: Once | INTRAVENOUS | Status: AC | PRN
  Administered 2018-02-16: 100 mL via INTRAVENOUS

## 2018-02-16 MED ORDER — IOPAMIDOL (ISOVUE-300) INJECTION 61%
INTRAVENOUS | Status: AC
  Filled 2018-02-16: qty 50

## 2018-02-16 MED ORDER — PROMETHAZINE HCL 25 MG/ML IJ SOLN: 6.2500 mg | INTRAMUSCULAR | Status: DC | PRN

## 2018-02-16 MED ORDER — LACTATED RINGERS IV SOLN: INTRAVENOUS | Status: DC

## 2018-02-16 MED ORDER — SODIUM CHLORIDE 0.9 % IV SOLN: Freq: Once | INTRAVENOUS | Status: DC

## 2018-02-16 MED ORDER — DEXAMETHASONE SODIUM PHOSPHATE 10 MG/ML IJ SOLN
INTRAMUSCULAR | Status: DC | PRN
  Administered 2018-02-16: 6 mg via INTRAVENOUS

## 2018-02-16 MED ORDER — DEXAMETHASONE SODIUM PHOSPHATE 10 MG/ML IJ SOLN
INTRAMUSCULAR | Status: AC
  Filled 2018-02-16: qty 1

## 2018-02-16 MED ORDER — ONDANSETRON HCL 4 MG/2ML IJ SOLN: 4.0000 mg | Freq: Four times a day (QID) | INTRAMUSCULAR | Status: DC | PRN

## 2018-02-16 MED ORDER — MIDAZOLAM HCL 2 MG/2ML IJ SOLN
INTRAMUSCULAR | Status: AC
  Filled 2018-02-16: qty 2

## 2018-02-16 MED ORDER — OXYCODONE HCL 5 MG PO TABS
5.0000 mg | ORAL_TABLET | ORAL | Status: DC | PRN
  Administered 2018-02-16 – 2018-02-18 (×5): 10 mg via ORAL
  Filled 2018-02-16 (×5): qty 2

## 2018-02-16 MED ORDER — SODIUM CHLORIDE 0.9 % IR SOLN
Status: DC | PRN
  Administered 2018-02-16: 3000 mL

## 2018-02-16 MED ORDER — PHENYLEPHRINE 40 MCG/ML (10ML) SYRINGE FOR IV PUSH (FOR BLOOD PRESSURE SUPPORT)
PREFILLED_SYRINGE | INTRAVENOUS | Status: AC
  Filled 2018-02-16: qty 10

## 2018-02-16 MED ORDER — LIDOCAINE 2% (20 MG/ML) 5 ML SYRINGE
INTRAMUSCULAR | Status: AC
  Filled 2018-02-16: qty 5

## 2018-02-16 MED ORDER — SODIUM CHLORIDE 0.9 % IV SOLN
Freq: Once | INTRAVENOUS | Status: AC
  Administered 2018-02-16: 13:00:00 via INTRAVENOUS

## 2018-02-16 MED ORDER — ONDANSETRON HCL 4 MG/2ML IJ SOLN
INTRAMUSCULAR | Status: DC | PRN
  Administered 2018-02-16: 4 mg via INTRAVENOUS

## 2018-02-16 MED ORDER — IOPAMIDOL (ISOVUE-370) INJECTION 76%
INTRAVENOUS | Status: AC
  Filled 2018-02-16: qty 100

## 2018-02-16 MED ORDER — ONDANSETRON HCL 4 MG PO TABS: 4.0000 mg | ORAL_TABLET | Freq: Four times a day (QID) | ORAL | Status: DC | PRN

## 2018-02-16 MED ORDER — LIDOCAINE 2% (20 MG/ML) 5 ML SYRINGE
INTRAMUSCULAR | Status: DC | PRN
  Administered 2018-02-16: 50 mg via INTRAVENOUS

## 2018-02-16 MED ORDER — SENNOSIDES-DOCUSATE SODIUM 8.6-50 MG PO TABS: 1.0000 | ORAL_TABLET | Freq: Every evening | ORAL | Status: DC | PRN

## 2018-02-16 MED ORDER — FENTANYL CITRATE (PF) 100 MCG/2ML IJ SOLN
INTRAMUSCULAR | Status: DC | PRN
  Administered 2018-02-16: 25 ug via INTRAVENOUS

## 2018-02-16 MED ORDER — ONDANSETRON HCL 4 MG/2ML IJ SOLN
INTRAMUSCULAR | Status: AC
  Filled 2018-02-16: qty 2

## 2018-02-16 SURGICAL SUPPLY — 23 items
BAG URINE DRAINAGE (UROLOGICAL SUPPLIES) ×3 IMPLANT
BAG URO CATCHER STRL LF (MISCELLANEOUS) ×3 IMPLANT
BASKET ZERO TIP NITINOL 2.4FR (BASKET) IMPLANT
CATH FOLEY 2WAY SLVR  5CC 16FR (CATHETERS) ×2
CATH FOLEY 2WAY SLVR 5CC 16FR (CATHETERS) ×1 IMPLANT
CATH URET 5FR 28IN OPEN ENDED (CATHETERS) ×3 IMPLANT
CLOTH BEACON ORANGE TIMEOUT ST (SAFETY) ×3 IMPLANT
COVER FOOTSWITCH UNIV (MISCELLANEOUS) IMPLANT
COVER SURGICAL LIGHT HANDLE (MISCELLANEOUS) ×3 IMPLANT
EXTRACTOR STONE 1.7FRX115CM (UROLOGICAL SUPPLIES) IMPLANT
GLOVE BIOGEL M STRL SZ7.5 (GLOVE) ×3 IMPLANT
GOWN STRL REUS W/TWL XL LVL3 (GOWN DISPOSABLE) ×3 IMPLANT
GUIDEWIRE ANG ZIPWIRE 038X150 (WIRE) IMPLANT
GUIDEWIRE STR DUAL SENSOR (WIRE) ×6 IMPLANT
MANIFOLD NEPTUNE II (INSTRUMENTS) ×3 IMPLANT
PACK CYSTO (CUSTOM PROCEDURE TRAY) ×3 IMPLANT
SHEATH URETERAL 12FRX28CM (UROLOGICAL SUPPLIES) IMPLANT
SHEATH URETERAL 12FRX35CM (MISCELLANEOUS) IMPLANT
STENT POLARIS 5FRX22 (STENTS) ×6 IMPLANT
TUBING CONNECTING 10 (TUBING) ×2 IMPLANT
TUBING CONNECTING 10' (TUBING) ×1
TUBING UROLOGY SET (TUBING) ×3 IMPLANT
WIRE COONS/BENSON .038X145CM (WIRE) IMPLANT

## 2018-02-16 NOTE — Progress Notes (Signed)
Follow Up Note: Gyn-Onc  Amy Morrow 57 y.o. female  CC:  Chief Complaint  Patient presents with  . Foley removal    Follow up  . Vaginal discharge, back pain   HPI: Amy Morrow is a 57 year old female initially seen in consultation at the request of Dr. Kennon Rounds for newly diagnosed cervical cancer.  She was being seen in the [facility] clinic and was noted to have a cervical mass on examination which prompted a cervical biopsy performed on 01/12/18 resulting cervical squamous cell carcinoma.  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 PET scan pre-operatively resulted: IMPRESSION: 1. Hypermetabolic cervical activity corresponding with the patient's known cervical cancer. No evidence of metastatic disease. 2. Focal hypermetabolic right thyroid (or parathyroid) activity. Hypermetabolic thyroid nodules on PET have up to 40-50% incidence of malignancy; recommend further evaluation with thyroid ultrasound and possible US-guided fine needle aspiration. 3. Borderline hepatic steatosis.  On February 08, 2018,she underwent a robotic-assisted type III radical laparoscopic hysterectomy with bilateral salpingoophorectomy and bilateral pelvic lymphadenectomy with Dr. [name]. Her post-operative course was uneventful.  Final path resulted: Diagnosis 1. Lymph nodes, regional resection, right pelvic - EIGHT LYMPH NODES, NEGATIVE FOR CARCINOMA (0/8). 2. Lymph nodes, regional resection, left pelvic - TWELVE LYMPH NODES, NEGATIVE FOR CARCINOMA (0/12). 3. Uterus +/- tubes/ovaries, neoplastic, cervix, bilateral tubes and ovaries, upper third of vagina - INVASIVE MODERATELY DIFFERENTIATED SQUAMOUS CELL CARCINOMA, 2.6 CM, INVOLVING ANTERIOR PORTION OF CERVIX FROM 9 O'CLOCK TO 3 O'CLOCK. - TUMOR INVADES FOR DEPTH OF 0.5 CM  AND IS CONFINED TO THE CERVIX. - ALL RESECTION MARGINS ARE NEGATIVE FOR CARCINOMA; THE CLOSEST IS THE VAGINAL CUFF MARGIN AT 1 CM. - NEGATIVE FOR LYMPHOVASCULAR OR PERINEURAL INVASION. - BENIGN ENDOMETRIAL POLYP, 2.6 CM. - BENIGN LEIOMYOMA, 2.2 CM - BENIGN INACTIVE ENDOMETRIUM. - BENIGN BILATERAL OVARIES AND FALLOPIAN TUBES. - SEE ONCOLOGY TABLE.  She went to the emergency room on 02/11/18 with complaints of fever and headaches.  Her tachycardia noted in the ED improved with IV fluids.  WBC count was 14.7.  Blood cultures returned negative.  Urine culture resulted on 02/14/18 with 70,000 colonies enterococcus faecalis.  Patient was not prescribed antibiotics for this.     Interval History:  She presents today to the office for foley removal s/p robotic assisted radical hysterectomy on February 08, 2018.  Amy Morrow, interpreter is present.  Patient stating she feels bad.  Reporting moderate lower back discomfort across her entire back that started right after surgery but has worsened.  Also reporting foul smelling discharge from the vagina.  She states her foley stopped draining yesterday and she felt the urine was leaking around the foley.  She went to the ED on February 11, 2018 for headache and fever and was told nothing was found that could explain her symptoms.  No fever or chills reported.  Denies chest pain or dyspnea but states she feels her breathing has been rapid at times.  She reports having a bowel movement last on Sunday with reports of constipation prior to that.  No nausea or emesis reported but not taking in much due to feeling poorly.  No significant abdominal pain reported.  No concerns voiced about her incisions.  Denies having intercourse or placing anything in the vagina since surgery.  No other concerns voiced.  Review of Systems  Constitutional: Feels poorly.  Uncomfortable due to pain.  Denies fever, chills at this time.  Decreased appetite.  Cardiovascular: No chest pain, shortness of  breath, or edema. Feels breathing is rapid at times.  Pulmonary: No cough or wheeze.  Gastrointestinal: No nausea, vomiting, or diarrhea. No bright red blood per rectum or change in bowel movement. Constipation after surgery that is resolving.  Genitourinary: No urine in foley since yesterday.  Foul smelling drainage from the vagina intermittently.  Musculoskeletal: No myalgia or joint pain Neurologic: No weakness, numbness, or change in gait.  Psychology: No depression, anxiety, or insomnia. Worried about this pain and her symptoms.  Current Meds:  Outpatient Encounter Medications as of 02/16/2018  Medication Sig  . acetaminophen (TYLENOL) 500 MG tablet Take 500 mg by mouth every 6 (six) hours as needed for fever.  . docusate sodium (COLACE) 100 MG capsule Take 1 capsule (100 mg total) by mouth 2 (two) times daily.   Facility-Administered Encounter Medications as of 02/16/2018  Medication  . [COMPLETED] 0.9 %  sodium chloride infusion    Allergy: No Known Allergies  Social Hx:   Social History   Socioeconomic History  . Marital status: Married    Spouse name: Not on file  . Number of children: Not on file  . Years of education: Not on file  . Highest education level: Not on file  Occupational History  . Not on file  Social Needs  . Financial resource strain: Not on file  . Food insecurity:    Worry: Not on file    Inability: Not on file  . Transportation needs:    Medical: Not on file    Non-medical: Not on file  Tobacco Use  . Smoking status: Never Smoker  . Smokeless tobacco: Never Used  Substance and Sexual Activity  . Alcohol use: No  . Drug use: No  . Sexual activity: Yes    Birth control/protection: None, Surgical  Lifestyle  . Physical activity:    Days per week: Not on file    Minutes per session: Not on file  . Stress: Not on file  Relationships  . Social connections:    Talks on phone: Not on file    Gets together: Not on file    Attends religious  service: Not on file    Active member of club or organization: Not on file    Attends meetings of clubs or organizations: Not on file    Relationship status: Not on file  . Intimate partner violence:    Fear of current or ex partner: Not on file    Emotionally abused: Not on file    Physically abused: Not on file    Forced sexual activity: Not on file  Other Topics Concern  . Not on file  Social History Narrative  . Not on file    Past Surgical Hx:  Past Surgical History:  Procedure Laterality Date  . D & C LEEP CONIZATION BX  07-31-2003   dr p. rose  [facility]  . PELVIC LYMPH NODE DISSECTION Bilateral 02/08/2018   Procedure: BILATEAL PEVIC  LYMPHADENECTOMY;  Surgeon: [name], MD;  Location: [facility];  Service: Gynecology;  Laterality: Bilateral;  . ROBOTIC ASSISTED TOTAL HYSTERECTOMY WITH BILATERAL SALPINGO OOPHERECTOMY N/A 02/08/2018   Procedure: XI ROBOTIC ASSISTED TOTAL Radical HYSTERECTOMY WITH BILATERAL SALPINGO OOPHORECTOMY;  Surgeon: [name], MD;  Location: [facility];  Service: Gynecology;  Laterality: N/A;  . Transvaginal tape placement  03-12-2009  dr Emeterio Reeve  [facility]   GYNECARE TENSION-[place] VAGINAL TAPE SLING  . TUBAL LIGATION  05-28-2005  DR  MARSHALL  @ [facility]   PPTL    Past Medical Hx:  Past Medical History:  Diagnosis Date  . Abnormal Pap smear   . Anemia   . Cervical cancer [facility]) oncologist- dr Denman George   Stage IB1  SCCa   . Hyperlipidemia   . Urgency of urination    intermittant    Family Hx:  Family History  Problem Relation Age of Onset  . Hypertension Mother   . Heart disease Mother   . Hypertension Brother   . Heart disease Brother     Vitals:  Last menstrual period 11/16/2012.  Physical Exam:  General: Well developed, well nourished female in mild distress appearing uncomfortable. Alert and oriented x 3. Skin slightly pale.  Cardiovascular: Tachycardic at 110 bpm. Regular rhythm. S1 and S2 normal.  Lungs: Clear to auscultation bilaterally. No  wheezes/crackles/rhonchi noted.  Skin: No rashes or lesions present. Back: Mild CVA tenderness.  Abdomen: Abdomen soft, non-tender and obese. Active bowel sounds in all quadrants. Lap sites healing with no drainage present  Healing ecchymosis noted around two sites.  Genitourinary:    Vulva/vagina: Normal external female genitalia. No lesions.    Urethra: No lesions or masses.    Vagina: Vaginal cuff with exudate present. Gush of yellowish drainage noted from the right apex of the cuff during exam.  Extremities: No bilateral cyanosis, edema, or clubbing.  Foley to leg bag with no urine output noted.  Foley removed to see if patient able to urinate since stating urine was leaking around the catheter.  After ten minutes, patient unable to void but had 3 cc of cloudy brownish-pink discharge in the measuring hat that she felt came from her vagina.  New 16 F foley catheter replaced without difficulty.  No urine return after placement.  Vaginal exam performed (see above).  Patient also examined by Dr. Lahoma Crocker in the office.  Per her examination, 2 cm central mucosal defect noted along with yellow drainage noted from the right apex.   Assessment/Plan:  57 year old female s/p robotic assisted radical hysterectomy, bilateral salpingoophorectomy and bilateral pelvic lymphadenectomy on February 08, 2018 with new exam findings of moderate, yellowish fluid from the vagina with no urine output in urinary catheter.  Per Dr. Delsa Sale, plan to admit patient to the hospital.  Dr. Delsa Sale spoke with Dr. Louis Meckel with [facility] to initiate consultation.  CT AP with delayed images along with retrograde cystogram ordered to rule out ureteral, bladder injury or fistula post-operatively.  Zosyn IV ordered.  Repeat lab work ordered for the am.  Avoid Tylenol use and recheck Cmet in the am.  Continue plan of care per Dr. Delsa Sale.     Amy Gibbs, NP 02/16/2018, 1:47 PM

## 2018-02-16 NOTE — Op Note (Signed)
Preoperative diagnosis:  1. Urinary leak  Postoperative diagnosis:  1. Bilateral ureteral leak  Procedure: 1. Cystoscopy, bilateral retrograde pyelograms with interpretation 2. Diagnostic ureteroscopy 3. Bilateral ureteral stent placement  Surgeon: Ardis Hughs, MD  Anesthesia: General  Complications: None  Intraoperative findings:  #1: The retrograde pyelogram on the patient's right side initially demonstrated significant contrast extravasation several centimeters from the ureteral orifice.  There was proximal hydroureteronephrosis. #2: Ureteroscopy of the right ureter demonstrated a full-thickness ureteral injury likely thermal in nature approximately 2-1/2 cm from the UVJ. #3: The retrograde on the patient's left side demonstrated severe extravasation with no contrast getting beyond the first centimeter of the ureter.  Once beyond this area with a ureteroscope the ureter was mild to moderately dilated. 4.:  Ureteroscopy of the left ureter demonstrated an area approximately 2 cm from the UVJ and lasting approximately a centimeter and a half of devitalized tissue, full-thickness, likely thermal in nature. #5: 5 French x22 cm Polaris stents were placed bilaterally.   EBL: Minimal  Specimens: None  Indication: Amy Morrow is a 57 y.o. patient status post radical hysterectomy with a urine leak.  After reviewing the management options for treatment, he elected to proceed with the above surgical procedure(s). We have discussed the potential benefits and risks of the procedure, side effects of the proposed treatment, the likelihood of the patient achieving the goals of the procedure, and any potential problems that might occur during the procedure or recuperation. Informed consent has been obtained.  Description of procedure:  The patient was taken to the operating room and general anesthesia was induced.  The patient was placed in the dorsal lithotomy position, prepped and  draped in the usual sterile fashion, and preoperative antibiotics were administered. A preoperative time-out was performed.  A 21 French 30 degrees cystoscope was gently passed to the patient's urethra and the bladder under visual guidance.  The bladder was then systematically inspected and noted to be normal.  There was some Foley catheter associated changes.  The ureteral orifices were orthotopic.  I then attempted to perform a retrograde pyelogram on the patient's right side with a 5 [place] open-ended ureteral catheter with the above findings.  I was unable to advance a wire through the ureter and as such I opted to perform ureteroscopy using a needle nose ureteroscope (4.5 [place]).  I was able to easily cannulate the right ureter and slowly navigated the lumen with the above findings.  Once beyond the area of concern I advanced up to the proximal ureter and finished the retrograde pyelogram with the above findings.  I then slowly advanced a sensor wire through the scope and removing the scope over the wire.  I then snapped this wire to the drape and replaced the ureteroscope with the cystoscope.  I then attempted to perform a left retrograde pyelogram through a 5 [place] open-ended ureteral catheter with the above findings.  Again I was unable to advance a wire beyond the UVJ.  I subsequently used the needle nose ureteroscope and was able to gently navigate the patient's transmural ureter and distal ureter noting the findings above.  I was able to navigate beyond the area of concern and again instilled contrast to perform a retrograde pyelogram.  I then advanced a sensor wire through the scope and into the renal pelvis removing the scope over the wire.  I then under fluoroscopic guidance advanced the 5 French Polaris stent over the right ureteral wire and into the right renal  pelvis.  Once the stent was in the renal pelvis the wire was slowly backed out as the stent was advanced into the urethral meatus.  The  stent was removed and the curl noted in the right renal pelvis.  This same process was performed on the patient's left side noting a good curl in the renal pelvis under fluoroscopic guidance.  I then reinserted the cystoscope into the patient's bladder which demonstrated that the bands on the end of the stent were nicely positioned and emanating from the respective ureteral orifice.  I subsequently emptied the patient's bladder place a 16 French Foley catheter, and terminated the case.  The patient tolerated the surgery quite well.  Ardis Hughs, M.D.

## 2018-02-16 NOTE — Progress Notes (Signed)
Nursing Eval

## 2018-02-16 NOTE — Anesthesia Procedure Notes (Signed)
Procedure Name: LMA Insertion Date/Time: 02/16/2018 8:49 PM Performed by: Anne Fu, CRNA Pre-anesthesia Checklist: Patient identified, Emergency Drugs available, Suction available, Patient being monitored and Timeout performed Patient Re-evaluated:Patient Re-evaluated prior to induction Oxygen Delivery Method: Circle system utilized Preoxygenation: Pre-oxygenation with 100% oxygen Induction Type: IV induction Ventilation: Mask ventilation without difficulty LMA: LMA inserted LMA Size: 4.0 Number of attempts: 1 Placement Confirmation: positive ETCO2 and breath sounds checked- equal and bilateral Tube secured with: Tape

## 2018-02-16 NOTE — H&P (Signed)
Gynecologic Oncology H&P  Amy Morrow 57 y.o. female  HPI: Amy Morrow is a 57 year old female initially seen in consultation at the request of Dr. Kennon Rounds for newly diagnosed cervical cancer.  She was being seen in the [facility] clinic and was noted to have a cervical mass on examination which prompted a cervical biopsy performed on 01/12/18 resulting cervical squamous cell carcinoma.  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 PET scan pre-operatively resulted: IMPRESSION: 1. Hypermetabolic cervical activity corresponding with the patient's known cervical cancer. No evidence of metastatic disease. 2. Focal hypermetabolic right thyroid (or parathyroid) activity. Hypermetabolic thyroid nodules on PET have up to 40-50% incidence of malignancy; recommend further evaluation with thyroid ultrasound and possible US-guided fine needle aspiration. 3. Borderline hepatic steatosis.  On February 08, 2018,she underwent a robotic-assisted type III radical laparoscopic hysterectomy with bilateral salpingoophorectomy and bilateral pelvic lymphadenectomy with Dr. [name]. Her post-operative course was uneventful.  Final path resulted: Diagnosis 1. Lymph nodes, regional resection, right pelvic - EIGHT LYMPH NODES, NEGATIVE FOR CARCINOMA (0/8). 2. Lymph nodes, regional resection, left pelvic - TWELVE LYMPH NODES, NEGATIVE FOR CARCINOMA (0/12). 3. Uterus +/- tubes/ovaries, neoplastic, cervix, bilateral tubes and ovaries, upper third of vagina - INVASIVE MODERATELY DIFFERENTIATED SQUAMOUS CELL CARCINOMA, 2.6 CM, INVOLVING ANTERIOR PORTION OF CERVIX FROM 9 O'CLOCK TO 3 O'CLOCK. - TUMOR INVADES FOR DEPTH OF 0.5 CM AND IS CONFINED TO THE CERVIX. - ALL RESECTION MARGINS ARE NEGATIVE FOR CARCINOMA; THE CLOSEST IS THE VAGINAL  CUFF MARGIN AT 1 CM. - NEGATIVE FOR LYMPHOVASCULAR OR PERINEURAL INVASION. - BENIGN ENDOMETRIAL POLYP, 2.6 CM. - BENIGN LEIOMYOMA, 2.2 CM - BENIGN INACTIVE ENDOMETRIUM. - BENIGN BILATERAL OVARIES AND FALLOPIAN TUBES. - SEE ONCOLOGY TABLE.  She went to the emergency room on 02/11/18 with complaints of fever and headaches.  Her tachycardia noted in the ED improved with IV fluids.  WBC count was 14.7.  Blood cultures returned negative.  Urine culture resulted on 02/14/18 with 70,000 colonies enterococcus faecalis.  Patient was not prescribed antibiotics for this.     Interval History:  She presents today to the office for foley removal s/p robotic assisted radical hysterectomy on February 08, 2018.  Almyra Free, interpreter is present.  Patient stating she feels bad.  Reporting moderate lower back discomfort across her entire back that started right after surgery but has worsened.  Also reporting foul smelling discharge from the vagina.  She states her foley stopped draining yesterday and she felt the urine was leaking around the foley.  She went to the ED on February 11, 2018 for headache and fever and was told nothing was found that could explain her symptoms.  No fever or chills reported.  Denies chest pain or dyspnea but states she feels her breathing has been rapid at times.  She reports having a bowel movement last on Sunday with reports of constipation prior to that.  No nausea or emesis reported but not taking in much due to feeling poorly.  No significant abdominal pain reported.  No concerns voiced about her incisions.  Denies having intercourse or placing anything in the vagina since surgery.  No other concerns voiced.   Review of Systems  Constitutional: Feels poorly.  Uncomfortable due to pain.  Denies fever, chills at this time.  Decreased appetite.  Cardiovascular:  No chest pain, shortness of breath, or edema. Feels breathing is rapid at times.  Pulmonary: No cough or wheeze.  Gastrointestinal: No nausea,  vomiting, or diarrhea. No bright red blood per rectum or change in bowel movement. Constipation after surgery that is resolving.  Genitourinary: No urine in foley since yesterday.  Foul smelling drainage from the vagina intermittently.  Musculoskeletal: No myalgia or joint pain Neurologic: No weakness, numbness, or change in gait.  Psychology: No depression, anxiety, or insomnia. Worried about this pain and her symptoms.  Current Meds:  Outpatient Encounter Medications as of 02/16/2018  Medication Sig  . acetaminophen (TYLENOL) 500 MG tablet Take 500 mg by mouth every 6 (six) hours as needed for fever.  . docusate sodium (COLACE) 100 MG capsule Take 1 capsule (100 mg total) by mouth 2 (two) times daily.   Facility-Administered Encounter Medications as of 02/16/2018  Medication  . [COMPLETED] 0.9 %  sodium chloride infusion    Allergy: No Known Allergies  Social Hx:   Social History   Socioeconomic History  . Marital status: Married    Spouse name: Not on file  . Number of children: Not on file  . Years of education: Not on file  . Highest education level: Not on file  Occupational History  . Not on file  Social Needs  . Financial resource strain: Not on file  . Food insecurity:    Worry: Not on file    Inability: Not on file  . Transportation needs:    Medical: Not on file    Non-medical: Not on file  Tobacco Use  . Smoking status: Never Smoker  . Smokeless tobacco: Never Used  Substance and Sexual Activity  . Alcohol use: No  . Drug use: No  . Sexual activity: Yes    Birth control/protection: None, Surgical  Lifestyle  . Physical activity:    Days per week: Not on file    Minutes per session: Not on file  . Stress: Not on file  Relationships  . Social connections:    Talks on phone: Not on file    Gets together: Not on file    Attends religious service: Not on file    Active member of club or organization: Not on file    Attends meetings of clubs or  organizations: Not on file    Relationship status: Not on file  . Intimate partner violence:    Fear of current or ex partner: Not on file    Emotionally abused: Not on file    Physically abused: Not on file    Forced sexual activity: Not on file  Other Topics Concern  . Not on file  Social History Narrative  . Not on file    Past Surgical Hx:  Past Surgical History:  Procedure Laterality Date  . D & C LEEP CONIZATION BX  07-31-2003   dr p. rose  [facility]  . PELVIC LYMPH NODE DISSECTION Bilateral 02/08/2018   Procedure: BILATEAL PEVIC  LYMPHADENECTOMY;  Surgeon: [name], MD;  Location: [facility];  Service: Gynecology;  Laterality: Bilateral;  . ROBOTIC ASSISTED TOTAL HYSTERECTOMY WITH BILATERAL SALPINGO OOPHERECTOMY N/A 02/08/2018   Procedure: XI ROBOTIC ASSISTED TOTAL Radical HYSTERECTOMY WITH BILATERAL SALPINGO OOPHORECTOMY;  Surgeon: [name], MD;  Location: [facility];  Service: Gynecology;  Laterality: N/A;  . Transvaginal tape placement  03-12-2009  dr Emeterio Reeve  [facility]   GYNECARE TENSION-FREE VAGINAL TAPE SLING  . TUBAL LIGATION  05-28-2005  DR  MARSHALL  @  [place]   PPTL    Past Medical Hx:  Past Medical History:  Diagnosis Date  . Abnormal Pap smear   . Anemia   . Cervical cancer [facility]) oncologist- dr Denman George   Stage IB1  SCCa   . Hyperlipidemia   . Urgency of urination    intermittant    Family Hx:  Family History  Problem Relation Age of Onset  . Hypertension Mother   . Heart disease Mother   . Hypertension Brother   . Heart disease Brother     Vitals:  Blood pressure 124/80, pulse 89, temperature 99.4 F (37.4 C), temperature source Oral, resp. rate 16, last menstrual period 11/16/2012, SpO2 100 %.  Physical Exam:  General: Well developed, well nourished female in mild distress appearing uncomfortable. Alert and oriented x 3. Skin slightly pale.  Cardiovascular: Tachycardic at 110 bpm. Regular rhythm. S1 and S2 normal.  Lungs: Clear to auscultation bilaterally. No  wheezes/crackles/rhonchi noted.  Skin: No rashes or lesions present. Back: Mild CVA tenderness.  Abdomen: Abdomen soft, non-tender and obese. Active bowel sounds in all quadrants. Lap sites healing with no drainage present  Healing ecchymosis noted around two sites.  Genitourinary:    Vulva/vagina: Normal external female genitalia. No lesions.    Urethra: No lesions or masses.    Vagina: Vaginal cuff with exudate present. Gush of yellowish drainage noted from the right apex of the cuff during exam.  Extremities: No bilateral cyanosis, edema, or clubbing.  Foley to leg bag with no urine output noted.  Foley removed to see if patient able to urinate since stating urine was leaking around the catheter.  After ten minutes, patient unable to void but had 3 cc of cloudy brownish-pink discharge in the measuring hat that she felt came from her vagina.  New 16 F foley catheter replaced without difficulty.  No urine return after placement.  Vaginal exam performed (see above).  Patient also examined by Dr. Lahoma Crocker in the office.  Per her examination, 2 cm central mucosal defect noted along with yellow drainage noted from the right apex.   Assessment/Plan:  57 year old female s/p robotic assisted radical hysterectomy, bilateral salpingoophorectomy and bilateral pelvic lymphadenectomy on February 08, 2018 with new exam findings of moderate, yellowish fluid from the vagina with no urine output in urinary catheter.  Per Dr. Delsa Sale, plan to admit patient to the hospital.  Dr. Delsa Sale spoke with Dr. Louis Meckel with [facility] to initiate consultation.  CT AP with delayed images along with retrograde cystogram ordered to rule out ureteral, bladder injury or fistula post-operatively.  Zosyn IV ordered.  Repeat lab work ordered for the am.  Avoid Tylenol use and recheck Cmet in the am.  Continue plan of care per Dr. Delsa Sale.     Dorothyann Gibbs, NP 02/16/2018, 3:39 PM

## 2018-02-16 NOTE — Patient Instructions (Signed)
Plan to be admitted to the hospital for further evaluation.

## 2018-02-16 NOTE — Consult Note (Signed)
I have been asked to see the patient by Dr. [name], for evaluation and management of concern of urinary tract leak.  History of present illness: 73 F s/p robotic hysterectomy for cervical cancer 1 week prior.  Her postop course was largely unremarkable and the patient was discharged home. She was seen several days later in the emergency room for fever and tachycardia which was treated.  She was noted to have a UTI. She presented to clinic for Foley catheter removal.  She was noted to have foul-smelling drainage from her vagina.  Patient was suspected and the patient was admitted to the hospital.  In the hospital the patient underwent a cystogram and a CT scan with delayed images.  The drainage was suspected to from a ureteral injury.  The patient denies any significant pain.  She does state that she is uncomfortable because of the leakage.  She has not had any fevers or chills.  She is otherwise without complaint.  Review of systems: A 12 point comprehensive review of systems was obtained and is negative unless otherwise stated in the history of present illness.  Patient Active Problem List   Diagnosis Date Noted  . Vaginal discharge 02/16/2018  . Cervical cancer, FIGO stage IB2 ([place]) 02/08/2018  . Cervical cancer, FIGO stage IB1 ([place]) 02/08/2018  . Abnormal uterine bleeding (AUB) 12/07/2017  . Neuropathic pain 02/06/2014  . Lichen planus 78/67/6720  . Dysuria 01/25/2013  . Cervical cancer ([place]) 07/29/2012  . Perimenopause 07/29/2012  . H/O burning pain in leg 06/24/2012  . CIN I (cervical intraepithelial neoplasia I) 07/11/2011  . LSIL (low grade squamous intraepithelial lesion) on Pap smear 06/11/2011    No current facility-administered medications on file prior to encounter.    Current Outpatient Medications on File Prior to Encounter  Medication Sig Dispense Refill  . acetaminophen (TYLENOL) 500 MG tablet Take 500 mg by mouth every 6 (six) hours as needed for fever.    .  docusate sodium (COLACE) 100 MG capsule Take 1 capsule (100 mg total) by mouth 2 (two) times daily. 20 capsule 0    Past Medical History:  Diagnosis Date  . Abnormal Pap smear   . Anemia   . Cervical cancer [facility]) oncologist- dr Denman George   Stage IB1  SCCa   . Hyperlipidemia   . Urgency of urination    intermittant    Past Surgical History:  Procedure Laterality Date  . D & C LEEP CONIZATION BX  07-31-2003   dr p. rose  [facility]  . PELVIC LYMPH NODE DISSECTION Bilateral 02/08/2018   Procedure: BILATEAL PEVIC  LYMPHADENECTOMY;  Surgeon: [name], MD;  Location: [facility];  Service: Gynecology;  Laterality: Bilateral;  . ROBOTIC ASSISTED TOTAL HYSTERECTOMY WITH BILATERAL SALPINGO OOPHERECTOMY N/A 02/08/2018   Procedure: XI ROBOTIC ASSISTED TOTAL Radical HYSTERECTOMY WITH BILATERAL SALPINGO OOPHORECTOMY;  Surgeon: [name], MD;  Location: [facility];  Service: Gynecology;  Laterality: N/A;  . Transvaginal tape placement  03-12-2009  dr Emeterio Reeve  [facility]   GYNECARE TENSION-FREE VAGINAL TAPE SLING  . TUBAL LIGATION  05-28-2005  DR  MARSHALL  @ [facility]   PPTL    Social History   Tobacco Use  . Smoking status: Never Smoker  . Smokeless tobacco: Never Used  Substance Use Topics  . Alcohol use: No  . Drug use: No    Family History  Problem Relation Age of Onset  . Hypertension Mother   . Heart disease Mother   . Hypertension Brother   .  Heart disease Brother     PE: Vitals:   02/16/18 1445  BP: 124/80  Pulse: 89  Resp: 16  Temp: 99.4 F (37.4 C)  TempSrc: Oral  SpO2: 100%   Patient appears to be in no acute distress  patient is alert and oriented x3 Atraumatic normocephalic head No cervical or supraclavicular lymphadenopathy appreciated No increased work of breathing, no audible wheezes/rhonchi Regular sinus rhythm/rate Abdomen is soft, nontender, nondistended, incisions are healing nicely, Foley catheter is draining clear yellow urine Lower extremities are symmetric without  appreciable edema Grossly neurologically intact No identifiable skin lesions  Recent Labs    02/16/18 1212  WBC 11.2*  HGB 10.2*  HCT 30.7*   Recent Labs    02/16/18 1212  NA 134*  K 3.8  CL 96*  CO2 28  GLUCOSE 99  BUN 12  CREATININE 0.69  CALCIUM 9.5   No results for input(s): LABPT, INR in the last 72 hours. No results for input(s): LABURIN in the last 72 hours. Results for orders placed or performed during the hospital encounter of 02/11/18  Urine culture     Status: Abnormal   Collection Time: 02/11/18  6:08 PM  Result Value Ref Range Status   Specimen Description   Final    URINE, RANDOM Performed at [facility] [address], [place], [place] [address]    Special Requests   Final    NONE Performed at [facility], [place] [address], [place], [place] [address]    Culture 70,000 COLONIES/mL ENTEROCOCCUS FAECALIS (A)  Final   Report Status 02/14/2018 FINAL  Final   Organism ID, Bacteria ENTEROCOCCUS FAECALIS (A)  Final      Susceptibility   Enterococcus faecalis - MIC*    AMPICILLIN <=2 SENSITIVE Sensitive     LEVOFLOXACIN 1 SENSITIVE Sensitive     NITROFURANTOIN <=16 SENSITIVE Sensitive     VANCOMYCIN 2 SENSITIVE Sensitive     * 70,000 COLONIES/mL ENTEROCOCCUS FAECALIS  Blood culture (routine x 2)     Status: None   Collection Time: 02/11/18  8:32 PM  Result Value Ref Range Status   Specimen Description   Final    BLOOD RIGHT ANTECUBITAL Performed at [facility] [address] [place], [place] [address]    Special Requests   Final    BOTTLES DRAWN AEROBIC AND ANAEROBIC Blood Culture adequate volume Performed at [facility] [address], [place], [place] [address]    Culture   Final    NO GROWTH 5 DAYS Performed at [facility], [place] [address], [place], [place] [address]    Report Status 02/16/2018 FINAL  Final  Blood culture (routine x 2)     Status: None    Collection Time: 02/11/18  8:32 PM  Result Value Ref Range Status   Specimen Description   Final    BLOOD LEFT ANTECUBITAL Performed at [facility] [address] [place], [place] [address]    Special Requests   Final    BOTTLES DRAWN AEROBIC AND ANAEROBIC Blood Culture adequate volume Performed at [facility] [address] [place], [place] [address]    Culture   Final    NO GROWTH 5 DAYS Performed at [facility], [place] [address], [place],  [address]    Report Status 02/16/2018 FINAL  Final    Imaging: I have independently reviewed the patient's cystogram demonstrating normal capacity bladder without  any evidence of extravasation or leak.  The post void images demonstrate no residual contrast.  I have also evaluated the patient's CT scan which demonstrates a leak which I suspect is from the patient's right distal ureter.  However, its unclear which side the leak is.  However there is contrast outside the patient's bladder.  This is consistent with a urine leak.  Imp: The patient has a urinary tract leak, presumably from the distal ureter.  There is some question as to which side, regardless I think this should be investigated more thoroughly in the operating room.  Recommendations: I recommended that the patient be taken to the operating room for cystoscopy, bilateral retrograde pyelogram, possible ureteral stent placement.  Placing a stent across the injured ureter would likely be all this patient would need in the short-term.  She may need drainage of the fluid collections at some point.  Suspect with time that this injury will heal and the patient stent can be removed without any further treatment.  I spoke to the patient detail about the operation including the risk and the benefits.  She understands that she is likely to have a stent in the postoperative setting.  I described stent irritative symptoms the patient was willing to  proceed.   Louis Meckel W

## 2018-02-16 NOTE — Transfer of Care (Signed)
Immediate Anesthesia Transfer of Care Note  Patient: Amy Morrow  Procedure(s) Performed: Procedure(s): CYSTOSCOPY WITH RETROGRADE PYELOGRAM BILATERAL DIAGNOSTIC URETEROSCOPY, BILATERAL  STENT PLACEMENT (Bilateral)  Patient Location: PACU  Anesthesia Type:General  Level of Consciousness:  sedated, patient cooperative and responds to stimulation  Airway & Oxygen Therapy:Patient Spontanous Breathing and Patient connected to face mask oxgen  Post-op Assessment:  Report given to PACU RN and Post -op Vital signs reviewed and stable  Post vital signs:  Reviewed and stable  Last Vitals:  Vitals:   02/16/18 1445 02/16/18 2008  BP: 124/80 114/71  Pulse: 89 91  Resp: 16 16  Temp: 37.4 C 37.6 C  SpO2: 161% 096%    Complications: No apparent anesthesia complications

## 2018-02-16 NOTE — Anesthesia Preprocedure Evaluation (Addendum)
Anesthesia Evaluation  Patient identified by MRN, date of birth, ID band Patient awake    Reviewed: Allergy & Precautions, NPO status , Patient's Chart, lab work & pertinent test results  History of Anesthesia Complications (+) PONVNegative for: history of anesthetic complications  Airway Mallampati: I  TM Distance: >3 FB Neck ROM: Full    Dental  (+) Dental Advisory Given   Pulmonary neg pulmonary ROS,    Pulmonary exam normal        Cardiovascular negative cardio ROS Normal cardiovascular exam     Neuro/Psych negative neurological ROS  negative psych ROS   GI/Hepatic negative GI ROS, Neg liver ROS,   Endo/Other  negative endocrine ROS  Renal/GU      Musculoskeletal negative musculoskeletal ROS (+)   Abdominal   Peds  Hematology negative hematology ROS (+)   Anesthesia Other Findings   Reproductive/Obstetrics                            Anesthesia Physical  Anesthesia Plan  ASA: II  Anesthesia Plan: General   Post-op Pain Management:    Induction: Intravenous  PONV Risk Score and Plan: 4 or greater and Ondansetron, Dexamethasone, Scopolamine patch - Pre-op and Diphenhydramine  Airway Management Planned: LMA  Additional Equipment:   Intra-op Plan:   Post-operative Plan: Extubation in OR  Informed Consent: I have reviewed the patients History and Physical, chart, labs and discussed the procedure including the risks, benefits and alternatives for the proposed anesthesia with the patient or authorized representative who has indicated his/her understanding and acceptance.   Dental advisory given  Plan Discussed with: CRNA and Surgeon  Anesthesia Plan Comments: (Pt says last food intake this am, last clears liquid 1700.)       Anesthesia Quick Evaluation

## 2018-02-17 ENCOUNTER — Encounter (HOSPITAL_COMMUNITY): Payer: Self-pay | Admitting: Urology

## 2018-02-17 LAB — COMPREHENSIVE METABOLIC PANEL
ALBUMIN: 3.2 g/dL — AB (ref 3.5–5.0)
ALT: 150 U/L — AB (ref 0–44)
AST: 54 U/L — ABNORMAL HIGH (ref 15–41)
Alkaline Phosphatase: 244 U/L — ABNORMAL HIGH (ref 38–126)
Anion gap: 10 (ref 5–15)
BILIRUBIN TOTAL: 0.4 mg/dL (ref 0.3–1.2)
BUN: 8 mg/dL (ref 6–20)
CO2: 27 mmol/L (ref 22–32)
CREATININE: 0.48 mg/dL (ref 0.44–1.00)
Calcium: 8.9 mg/dL (ref 8.9–10.3)
Chloride: 98 mmol/L (ref 98–111)
GFR calc Af Amer: >60 mL/min (ref >60)
GFR calc non Af Amer: >60 mL/min (ref >60)
GLUCOSE: 199 mg/dL — AB (ref 70–99)
Potassium: 4.3 mmol/L (ref 3.5–5.1)
SODIUM: 135 mmol/L (ref 135–145)
Total Protein: 7.3 g/dL (ref 6.5–8.1)

## 2018-02-17 LAB — CREATININE, BODY FLUID OTHER: Creatinine, Body Fluid: 17.5 mg/dL

## 2018-02-17 LAB — CBC
HCT: 31.5 % — ABNORMAL LOW (ref 36.0–46.0)
Hemoglobin: 10.5 g/dL — ABNORMAL LOW (ref 12.0–15.0)
MCH: 30.1 pg (ref 26.0–34.0)
MCHC: 33.3 g/dL (ref 30.0–36.0)
MCV: 90.3 fL (ref 78.0–100.0)
PLATELETS: 548 10*3/uL — AB (ref 150–400)
RBC: 3.49 MIL/uL — ABNORMAL LOW (ref 3.87–5.11)
RDW: 13.5 % (ref 11.5–15.5)
WBC: 13.6 10*3/uL — ABNORMAL HIGH (ref 4.0–10.5)

## 2018-02-17 MED ORDER — POLYETHYLENE GLYCOL 3350 17 G PO PACK
17.0000 g | PACK | Freq: Every day | ORAL | Status: DC
  Administered 2018-02-17 – 2018-02-18 (×2): 17 g via ORAL
  Filled 2018-02-17 (×2): qty 1

## 2018-02-17 NOTE — Progress Notes (Signed)
1 Day Post-Op Procedure(s) (LRB): CYSTOSCOPY WITH RETROGRADE PYELOGRAM BILATERAL DIAGNOSTIC URETEROSCOPY, BILATERAL  STENT PLACEMENT (Bilateral) Patient admitted 02/16/18 for no urine output in foley over 24 hours, vaginal drainage, severe lower back pain.  Stat CT AP and retrograde cystogram ordered revealing probable leak from left distal ureter.  Patient was taken to the OR with Dr. Louis Meckel with Urology the evening of 02/16/18 for cystoscopy, diagnostic ureteroscopy, and bilateral ureteral stent placement.  Operative findings included right hydroureteronephrosis, bilateral ureteral injury.    Subjective: Patient reports feeling hungry this am.  Son at the bedside.  Concerned there is no urine in the foley.  States her lower back pain is improving and no discomfort noted in the abdomen.  Asking if she has an extra incision from the procedure from last pm.  No nausea or emesis reported.  Denies chest pain, dyspnea.  Passing flatus.  Son wanting to make sure his mother will be ok.  Patient stating she feels wet and that she feels her urine is leaking around the catheter.      Objective: Vital signs in last 24 hours: Temp:  [97.9 F (36.6 C)-99.6 F (37.6 C)] 98.2 F (36.8 C) (07/17 0658) Pulse Rate:  [67-115] 67 (07/17 0658) Resp:  [15-20] 16 (07/17 0658) BP: (108-130)/(63-86) 110/77 (07/17 0658) SpO2:  [100 %] 100 % (07/17 0658) Weight:  [144 lb 6.4 oz (65.5 kg)] 144 lb 6.4 oz (65.5 kg) (07/16 1123) Last BM Date: 02/13/18  Intake/Output from previous day: 07/16 0701 - 07/17 0700 In: 643.3 [I.V.:629.2; IV Piggyback:14.2] Out: 20 [Blood:20]  Physical Examination: General: alert, cooperative and no distress Resp: clear to auscultation bilaterally Cardio: regular rate and rhythm, S1, S2 normal, no murmur, click, rub or gallop GI: soft, non-tender; bowel sounds normal; no masses,  no organomegaly and incision: lap sites to the abdomen healing with dermabond without drainage, resolving  ecchymosis present Extremities: extremities normal, atraumatic, no cyanosis or edema Foley to straight drain with minimal output.   Labs: WBC/Hgb/Hct/Plts:  13.6/10.5/31.5/548 (07/17 2330) BUN/Cr/glu/ALT/AST/amyl/lip:  8/0.48/--/150/54/--/-- (07/17 0762)  Assessment: 57 y.o. s/p Procedure(s): CYSTOSCOPY WITH RETROGRADE PYELOGRAM BILATERAL DIAGNOSTIC URETEROSCOPY, BILATERAL  STENT PLACEMENT: stable Pain:  Pain is well-controlled on PRN medications.  Heme: Hgb 10.5 and Hct 31.5 this am.  Stable compared to CBC from 02/16/18.  ID: On Zosyn IV until tomorrow per Dr. Denman George.  Urine culture from ED resulted on 02/14/18 70,000 colonies of enterococcus faecalis.  CV: BP and HR stable.  GI:  Tolerating po: No: NPO. Diet advanced.  GU: S/P bilateral stent placement. Creatinine 0.48 this am. Minimal output in foley.       FEN: Stable this am compared with Cmet from 02/16/18.  Prophylaxis: SCDs.  Plan: Saline lock IV or keep KVO for zosyn administration Discontinue foley per Dr. Denman George Continue IV Zosyn until tomorrow Continue plan of care per Dr. Denman George Diet regular    LOS: 1 day    Melissa D Cross 02/17/2018, 10:51 AM

## 2018-02-17 NOTE — Progress Notes (Signed)
Patient came back from surgery at 2205 stable awake and alert. Patient report urinary leaks foley in place. Small amount of urine in foley tubing. Will notified Dr. Louis Meckel in the morning. Multiple attempts were made to get a diet order. Will try again in the morning. Family member at bedside. Will continue to monitor.

## 2018-02-17 NOTE — Anesthesia Postprocedure Evaluation (Signed)
Anesthesia Post Note  Patient: Amy Morrow  Procedure(s) Performed: CYSTOSCOPY WITH RETROGRADE PYELOGRAM BILATERAL DIAGNOSTIC URETEROSCOPY, BILATERAL  STENT PLACEMENT (Bilateral Ureter)     Patient location during evaluation: PACU Anesthesia Type: General Level of consciousness: sedated Pain management: pain level controlled Vital Signs Assessment: post-procedure vital signs reviewed and stable Respiratory status: spontaneous breathing and respiratory function stable Cardiovascular status: stable Postop Assessment: no apparent nausea or vomiting Anesthetic complications: no                 Calix Heinbaugh [place]

## 2018-02-17 NOTE — Progress Notes (Signed)
Urology Inpatient Progress Report  post op vaginal drainage  Procedure(s): CYSTOSCOPY WITH RETROGRADE PYELOGRAM BILATERAL DIAGNOSTIC URETEROSCOPY, BILATERAL  STENT PLACEMENT  1 Day Post-Op   Intv/Subj: No acute events overnight. Patient is without complaint.  Active Problems:   Vaginal discharge  Current Facility-Administered Medications  Medication Dose Route Frequency Provider Last Rate Last Dose  . ondansetron (ZOFRAN) tablet 4 mg  4 mg Oral Q6H PRN Cross, Melissa D, NP       Or  . ondansetron (ZOFRAN) injection 4 mg  4 mg Intravenous Q6H PRN Cross, Melissa D, NP      . oxyCODONE (Oxy IR/ROXICODONE) immediate release tablet 5-10 mg  5-10 mg Oral Q4H PRN Joylene John D, NP   10 mg at 02/17/18 0359  . piperacillin-tazobactam (ZOSYN) IVPB 3.375 g  3.375 g Intravenous Q8H Lahoma Crocker, MD   Stopped at 02/17/18 1117  . senna-docusate (Senokot-S) tablet 1 tablet  1 tablet Oral QHS PRN Joylene John D, NP         Objective: Vital: Vitals:   02/16/18 2348 02/17/18 0106 02/17/18 0408 02/17/18 0658  BP: 130/74 123/73 119/74 110/77  Pulse: 82 76 69 67  Resp: 16 16 20 16   Temp: 99.6 F (37.6 C) 99.5 F (37.5 C) 99.3 F (37.4 C) 98.2 F (36.8 C)  TempSrc: Oral Oral Oral Oral  SpO2: 100% 100% 100% 100%   I/Os: I/O last 3 completed shifts: In: 643.3 [I.V.:629.2; IV Piggyback:14.2] Out: 20 [Blood:20]  Physical Exam:  General: Patient is in no apparent distress Lungs: Normal respiratory effort, chest expands symmetrically. GI: Incisions  Healing, The abdomen is soft and nontender without mass. Foley: draining clear yellow urine  Ext: lower extremities symmetric  Lab Results: Recent Labs    02/16/18 1212 02/17/18 0352  WBC 11.2* 13.6*  HGB 10.2* 10.5*  HCT 30.7* 31.5*   Recent Labs    02/16/18 1212 02/17/18 0352  NA 134* 135  K 3.8 4.3  CL 96* 98  CO2 28 27  GLUCOSE 99 199*  BUN 12 8  CREATININE 0.69 0.48  CALCIUM 9.5 8.9   No results for  input(s): LABPT, INR in the last 72 hours. No results for input(s): LABURIN in the last 72 hours. Results for orders placed or performed during the hospital encounter of 02/16/18  Surgical pcr screen     Status: None   Collection Time: 02/16/18  7:56 PM  Result Value Ref Range Status   MRSA, PCR NEGATIVE NEGATIVE Final   Staphylococcus aureus NEGATIVE NEGATIVE Final    Comment: (NOTE) The Xpert SA Assay (FDA approved for NASAL specimens in patients 48 years of age and older), is one component of a comprehensive surveillance program. It is not intended to diagnose infection nor to guide or monitor treatment. Performed at [facility], [place] [address], [place], [place] [address]     Studies/Results: Ct Abdomen Pelvis W Contrast  Addendum Date: 02/16/2018   ADDENDUM REPORT: 02/16/2018 18:28 ADDENDUM: The original report was by Dr. Van Clines. The following addendum is by Dr. Van Clines: The clinical team called me at about 5:40 p.m. Eastern time on February 16, 2018 and we discussed the above findings and impressions in detail. Electronically Signed   By: Van Clines M.D.   On: 02/16/2018 18:28   Result Date: 02/16/2018 CLINICAL DATA:  Drainage from vagina, query ureteral/bladder injury or fistula. EXAM: CT ABDOMEN AND PELVIS WITH CONTRAST TECHNIQUE: Multidetector CT imaging of the abdomen and pelvis was performed  using the standard protocol following bolus administration of intravenous contrast. CONTRAST:  150mL ISOVUE-300 IOPAMIDOL (ISOVUE-300) INJECTION 61% COMPARISON:  Cystogram dated 02/16/2018; PET-CT from 02/03/2018 FINDINGS: Lower chest: Unremarkable Hepatobiliary: Hypodensity in segment 4 of the liver near the falciform ligament was not recently hypermetabolic and probably represents focal fatty infiltration. The liver and gallbladder appear otherwise unremarkable. No biliary dilatation. Pancreas: Unremarkable Spleen: Unremarkable Adrenals/Urinary  Tract: Adrenal glands normal. Bilateral mild hydroureter and mild left hydronephrosis. There is leakage of contrast medium from the distal ureter vicinity, probably on the left side, into a collection of gas, fluid, and contrast just above the vaginal cuff. This is actively leaking from the ureter. This cystogram from earlier today did not appear to demonstrate leakage of contrast from the bladder, and accordingly the source of the leak is probably ureteral. The collection of extravasated ureteral contrast, gas, and fluid measures about 8.6 by 4.3 by 4.9 cm just above the vaginal cuff, but also with some separate enhancing pockets including a 3.8 by 2.0 by 2.1 cm probable abscess near the rectosigmoid junction on image 19/5, as well as a 6.6 by 3.3 by 5.8 cm collection of gas and fluid tracking along the left pelvic sidewall. The source of the gas could be infection, or a defect in the vaginal cuff. Either way, this is a set up for infection and abscess formation, and the lesion along the rectosigmoid junction appears to likely be a walled off abscess. I would not normally expected postoperative gas to still be present 8 days out after laparoscopy in the pelvis; the gas that is present is considered abnormal. Stomach/Bowel: There is potentially some secondary wall thickening of the rectosigmoid adjacent to the small abscess. I am skeptical of rectosigmoid as a source for the infection. Appendix normal. Vascular/Lymphatic: Mild abdominal aortic atherosclerotic calcification. Reproductive: Uterus absent.  Ovaries not well seen. Other: No supplemental non-categorized findings. Musculoskeletal: Mild left foraminal stenosis at L5-S1 due to facet and intervertebral spurring. Broad Schmorl's node along the superior endplate of L4. IMPRESSION: 1. Active leak of urinary tract contrast into a collection of fluid and gas just above the vaginal cuff. I favor the contrast leak is coming from the left distal ureter, less likely  to be from the right distal ureter. Given the appearance on the earlier cystogram today, the bladder is not thought to be the source of the leak. Irregular collections of fluid and gas in the pelvis suggesting early abscess formation, this gas indicates either infection or leak a coach of gas through the vaginal cuff (which in turn predisposes to infection). This gas is not normal postoperative gas 8 days out after laparoscopy. An encapsulated smaller abscess is present adjacent to the sigmoid colon. 2. Mild bilateral hydroureter and mild left hydronephrosis. This may be due to extrinsic mass effect and inflammation along the distal ureters. 3.  Aortoiliac atherosclerotic vascular disease. 4. Left foraminal impingement at L5-S1 due to spurring. Electronically Signed: By: Van Clines M.D. On: 02/16/2018 17:55   Dg Cystogram  Result Date: 02/16/2018 CLINICAL DATA:  57 year old female post hysterectomy. Vaginal leak. Initial encounter. EXAM: CYSTOGRAM TECHNIQUE: Patient came to radiology suite catheterized. Urinary bladder was filled with 210 mL Cysto-Hypaque 30% by drip infusion. Serial spot images were obtained during bladder filling and post draining. FLUOROSCOPY TIME:  Fluoroscopy Time:  1 minutes and 6 seconds. Radiation Exposure Index: 39 mGy. COMPARISON:  02/03/2018 PET-CT. FINDINGS: Mild irregularity of the posterior aspect of bladder wall may represent postoperative changes. No urinary  bladder leak identified. Contrast drained. IMPRESSION: No urinary bladder leak identified. Irregularity of the posterior aspect of bladder may be related to postoperative changes. Please see follow-up CT report. Electronically Signed   By: Genia Del M.D.   On: 02/16/2018 18:18   Dg C-arm 1-60 Min-no Report  Result Date: 02/16/2018 Fluoroscopy was utilized by the requesting physician.  No radiographic interpretation.    Assessment: Procedure(s): CYSTOSCOPY WITH RETROGRADE PYELOGRAM BILATERAL DIAGNOSTIC  URETEROSCOPY, BILATERAL  STENT PLACEMENT, 1 Day Post-Op  doing well.  Plan: The patient will keep the ureteral stents for 6 weeks and then follow-up with me.  We will probably get a cystogram prior to removal of the stents and see if we can get reflux of the ureter to ensure integrity of the ureters prior to removing the stents.  The Foley catheter can be removed at the primary team's discretion.  I have updated the family.  Please contact me directly with any additional questions or concerns.   Louis Meckel, MD Urology 02/17/2018, 1:12 PM

## 2018-02-18 ENCOUNTER — Telehealth: Payer: Self-pay | Admitting: *Deleted

## 2018-02-18 DIAGNOSIS — N821 Other female urinary-genital tract fistulae: Secondary | ICD-10-CM

## 2018-02-18 LAB — CBC
HCT: 30.4 % — ABNORMAL LOW (ref 36.0–46.0)
Hemoglobin: 9.8 g/dL — ABNORMAL LOW (ref 12.0–15.0)
MCH: 29.4 pg (ref 26.0–34.0)
MCHC: 32.2 g/dL (ref 30.0–36.0)
MCV: 91.3 fL (ref 78.0–100.0)
PLATELETS: 552 10*3/uL — AB (ref 150–400)
RBC: 3.33 MIL/uL — AB (ref 3.87–5.11)
RDW: 13.6 % (ref 11.5–15.5)
WBC: 8.6 10*3/uL (ref 4.0–10.5)

## 2018-02-18 LAB — BASIC METABOLIC PANEL
Anion gap: 9 (ref 5–15)
BUN: 14 mg/dL (ref 6–20)
CO2: 30 mmol/L (ref 22–32)
CREATININE: 0.71 mg/dL (ref 0.44–1.00)
Calcium: 8.7 mg/dL — ABNORMAL LOW (ref 8.9–10.3)
Chloride: 98 mmol/L (ref 98–111)
GFR calc Af Amer: >60 mL/min (ref >60)
GLUCOSE: 123 mg/dL — AB (ref 70–99)
POTASSIUM: 4 mmol/L (ref 3.5–5.1)
SODIUM: 137 mmol/L (ref 135–145)

## 2018-02-18 MED ORDER — OXYCODONE HCL 5 MG PO TABS: 5.0000 mg | ORAL_TABLET | ORAL | 0 refills | Status: DC | PRN

## 2018-02-18 MED ORDER — AMOXICILLIN-POT CLAVULANATE 875-125 MG PO TABS: 1.0000 | ORAL_TABLET | Freq: Two times a day (BID) | ORAL | 0 refills | Status: DC

## 2018-02-18 MED ORDER — POLYETHYLENE GLYCOL 3350 17 G PO PACK: 17.0000 g | PACK | Freq: Every day | ORAL | 0 refills | Status: DC

## 2018-02-18 MED ORDER — SENNA 8.6 MG PO TABS: 1.0000 | ORAL_TABLET | Freq: Every day | ORAL | 0 refills | Status: DC

## 2018-02-18 NOTE — Discharge Instructions (Signed)
DISCHARGE INSTRUCTIONS FOR KIDNEY STONE/URETERAL STENT   Dr Louis Meckel diagnosed a hole that formed in the ureter (the tube that drains the kidney to the bladder) on both sides. This is what is causing the leakage of urine from the vagina. Dr Louis Meckel placed "stents" in the ureters to assist in the healing (sealing over) of the holes. There will be leakage of urine from the vagina until these holes seal over on their own. This may take as long as 6 weeks.   MEDICATIONS:  1.  Resume all your other meds from home - except do not take any extra narcotic pain meds that you may have at home.  2. AZO is over the counter and can be used to help with the burning/stinging when you urinate. 3. Dr Denman George has prescribed percocet for pain. However, it is preferred that you take tylenol or ibuprofen first, because percocet can be constipating. This medication should be reserved for severe pain that is not made better with tylenol or ibuprofen. 4. Dr Denman George has prescribed sennakot and miralax to take daily to assist in you having regular bowel movements. Once you resume daily bowel movements this can be discontinued. If you develop diarrhea these can be discontinued.  5. You have been prescribed the antibiotic Augmentin to take morning and night for 7 days.   ACTIVITY:  1. No strenuous activity (lifting more than 10 lbs or anything that requires 2 hands to lift) x 1week.  2. No driving while on narcotic pain medications  3. Drink plenty of water  4. Continue to walk at home - you can still get blood clots when you are at home, so keep active, but don't over do it.  5. May return to work in 5 weeks.  BATHING:  1. You can shower and we recommend daily showers   2. You will need to wear diapers or pads to absorb the urine that will leak from the vagina until this no longer leaks. When leaking stops this is a sign that the holes in the ureters have healed over.   SIGNS/SYMPTOMS TO CALL:  Please call us if you have  a fever greater than 101.5, uncontrolled nausea/vomiting, uncontrolled pain, dizziness, unable to urinate, bloody urine, chest pain, shortness of breath, leg swelling, leg pain, redness around wound, drainage from wound, or any other concerns or questions.   You can reach the doctors at [facility] (who placed the ureteral stents) at [phone].  You can reach the doctors at Dr Daphanie Oquendo's office in the [facility] at [phone].   FOLLOW-UP:  1. You have an appointment in 6 weeks for removal of your stents (at [facility]). 2. Dr Denman George will check on you in the office in 1 week (at the [facility])

## 2018-02-18 NOTE — Progress Notes (Signed)
Patient ambulated in hallway with no distress. Patient tolerated activity well.

## 2018-02-18 NOTE — Telephone Encounter (Signed)
Per Dr. Denman George, patient to be scheduled for a follow up appt next week. I have called and spoke with the son. Appt scheduled for 7/23 at 9am

## 2018-02-18 NOTE — Discharge Summary (Signed)
Physician Discharge Summary  Patient ID: Amy Morrow MRN: [id number] DOB/AGE: Feb 17, 1961 57 y.o.  Admit date: 02/16/2018 Discharge date: 02/18/2018  Admission Diagnoses: Ureterovaginal fistula  Discharge Diagnoses:  Principal Problem:   Ureterovaginal fistula Active Problems:   Cervical cancer, FIGO stage IB1 ([place])   Vaginal discharge   Discharged Condition: good  Hospital Course:  1/ patient was admitted on 02/16/18 for leakage of urine from the vagina 1 week s/p radical hysterectomy. There was a mild elevation in white blood cell count and pelvic discomfort consistent with a low grade pelvic infection. 2/ on 02/16/18 a CT scan showed uretero vaginal fistula 3/ on 02/16/18 Dr Louis Meckel from [facility] took the patient to the OR for a cystoscopy and retrograde pyelography and bilateral stent placement. He diagnosed bilateral ureteral defects in the distal 2cm of the ureters that were resulting in uretero-vaginal fistulae. Stents were passed bilaterally. The patient had been started on zosyn 4/ on hospital day 3 the patient's white blood cell count had normalized and she exhibted normal vitals with no evidence of ongoing infection. 5/ the patient was meeting discharge criteria: tolerating PO, voiding small amounts of urine, ambulating, pain well controlled on oral medications.  6/ new medications on discharge include percocet, sennakot and miralax, augmentin  1 week.  She will be seen back by Dr Denman George in 1 week and by Dr Louis Meckel in 6 weeks with plan for stent removal if the fistulae have sealed.  Consults: urology  Significant Diagnostic Studies: labs:  CBC    Component Value Date/Time   WBC 8.6 02/18/2018 0435   RBC 3.33 (L) 02/18/2018 0435   HGB 9.8 (L) 02/18/2018 0435   HGB 10.2 (L) 02/16/2018 1212   HCT 30.4 (L) 02/18/2018 0435   PLT 552 (H) 02/18/2018 0435   PLT 419 (H) 02/16/2018 1212   MCV 91.3 02/18/2018 0435   MCH 29.4 02/18/2018 0435   MCHC 32.2 02/18/2018  0435   RDW 13.6 02/18/2018 0435   LYMPHSABS 1.5 02/16/2018 1212   MONOABS 1.0 (H) 02/16/2018 1212   EOSABS 0.1 02/16/2018 1212   BASOSABS 0.0 02/16/2018 1212   BMET    Component Value Date/Time   NA 137 02/18/2018 0435   K 4.0 02/18/2018 0435   CL 98 02/18/2018 0435   CO2 30 02/18/2018 0435   GLUCOSE 123 (H) 02/18/2018 0435   BUN 14 02/18/2018 0435   CREATININE 0.71 02/18/2018 0435   CALCIUM 8.7 (L) 02/18/2018 0435   GFRNONAA >60 02/18/2018 0435   GFRAA >60 02/18/2018 0435    and radiology: CT scan: see above - demonstrated uretero-vaginal fistula  Treatments: IV hydration, antibiotics: Zosyn and surgery: cysto with bilateral ureteral stent placement  Discharge Exam: Blood pressure 100/67, pulse 72, temperature 98.7 F (37.1 C), temperature source Oral, resp. rate 12, last menstrual period 11/16/2012, SpO2 99 %. General appearance: alert and cooperative GI: soft, non-tender; bowel sounds normal; no masses,  no organomegaly Incision/Wound: Well healed.  Disposition: Discharge disposition: 01-Home or Self Care     home  Discharge Instructions    (HEART FAILURE PATIENTS) Call MD:  Anytime you have any of the following symptoms: 1) 3 pound weight gain in 24 hours or 5 pounds in 1 week 2) shortness of breath, with or without a dry hacking cough 3) swelling in the hands, feet or stomach 4) if you have to sleep on extra pillows at night in order to breathe.   Complete by:  As directed    Call MD  for:  difficulty breathing, headache or visual disturbances   Complete by:  As directed    Call MD for:  extreme fatigue   Complete by:  As directed    Call MD for:  hives   Complete by:  As directed    Call MD for:  persistant dizziness or light-headedness   Complete by:  As directed    Call MD for:  persistant nausea and vomiting   Complete by:  As directed    Call MD for:  redness, tenderness, or signs of infection (pain, swelling, redness, odor or green/yellow discharge around  incision site)   Complete by:  As directed    Call MD for:  severe uncontrolled pain   Complete by:  As directed    Call MD for:  temperature >100.4   Complete by:  As directed    Diet - low sodium heart healthy   Complete by:  As directed    Diet general   Complete by:  As directed    Driving Restrictions   Complete by:  As directed    No driving for 7 days or until off narcotic pain medication   Increase activity slowly   Complete by:  As directed    Remove dressing in 24 hours   Complete by:  As directed    Sexual Activity Restrictions   Complete by:  As directed    No intercourse for 6 weeks     Allergies as of 02/18/2018   No Known Allergies     Medication List    TAKE these medications   acetaminophen 500 MG tablet Commonly known as:  TYLENOL Take 500 mg by mouth every 6 (six) hours as needed for fever.   amoxicillin-clavulanate 875-125 MG tablet Commonly known as:  AUGMENTIN Take 1 tablet by mouth 2 (two) times daily.   docusate sodium 100 MG capsule Commonly known as:  COLACE Take 1 capsule (100 mg total) by mouth 2 (two) times daily.   oxyCODONE 5 MG immediate release tablet Commonly known as:  Oxy IR/ROXICODONE Take 1-2 tablets (5-10 mg total) by mouth every 4 (four) hours as needed for moderate pain or severe pain.   polyethylene glycol packet Commonly known as:  MIRALAX / GLYCOLAX Take 17 g by mouth daily.   senna 8.6 MG Tabs tablet Commonly known as:  SENOKOT Take 1 tablet (8.6 mg total) by mouth at bedtime.      Follow-up Information    Ardis Hughs, MD In 6 weeks.   Specialty:  Urology Why:  Stent removal Contact information: [place] [address] [phone]        [name], MD Follow up in 1 week(s).   Specialty:  Gynecologic Oncology Contact information: [place] [address] [phone]           Signed: Thereasa Solo 02/18/2018, 7:53 AM

## 2018-02-22 ENCOUNTER — Encounter: Payer: Self-pay | Admitting: Gynecologic Oncology

## 2018-02-22 NOTE — Progress Notes (Signed)
Gynecologic Oncology Multi-Disciplinary Disposition Conference Note  Date of the Conference: February 22, 2018  Patient Name: Amy Morrow  Referring GYN: Dr. Kennon Rounds Primary GYN Oncologist: Dr. 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 Disposition is to close surveillance.  Further thyroid work up for hypermetabolic nodule on PET to be performed by PCP. If no PCP, referral will be made.   This Multidisciplinary conference took place involving physicians from [place], [place], Radiation Oncology, Pathology, Radiology along with the Gynecologic Oncology Nurse Practitioner and RN.  Comprehensive assessment of the patient's malignancy, staging, need for surgery, chemotherapy, radiation therapy, and need for further testing were reviewed. Supportive measures, both inpatient and following discharge were also discussed. The recommended plan of care is documented. Greater than 35 minutes were spent correlating and coordinating this patient's care.

## 2018-02-23 ENCOUNTER — Encounter (INDEPENDENT_AMBULATORY_CARE_PROVIDER_SITE_OTHER): Payer: Self-pay | Admitting: Physician Assistant

## 2018-02-23 ENCOUNTER — Inpatient Hospital Stay (HOSPITAL_BASED_OUTPATIENT_CLINIC_OR_DEPARTMENT_OTHER): Payer: Medicaid Other | Admitting: Gynecologic Oncology

## 2018-02-23 ENCOUNTER — Encounter: Payer: Self-pay | Admitting: Oncology

## 2018-02-23 ENCOUNTER — Other Ambulatory Visit: Payer: Self-pay

## 2018-02-23 ENCOUNTER — Ambulatory Visit (INDEPENDENT_AMBULATORY_CARE_PROVIDER_SITE_OTHER): Payer: Medicaid Other | Admitting: Physician Assistant

## 2018-02-23 ENCOUNTER — Telehealth: Payer: Self-pay | Admitting: Oncology

## 2018-02-23 ENCOUNTER — Encounter: Payer: Self-pay | Admitting: Gynecologic Oncology

## 2018-02-23 VITALS — BP 123/83 | HR 118 | Temp 98.5°F | Resp 20 | Ht 59.0 in | Wt 142.0 lb

## 2018-02-23 VITALS — BP 112/79 | HR 119 | Temp 98.3°F | Ht 59.0 in | Wt 144.6 lb

## 2018-02-23 DIAGNOSIS — N821 Other female urinary-genital tract fistulae: Secondary | ICD-10-CM | POA: Diagnosis not present

## 2018-02-23 DIAGNOSIS — R7303 Prediabetes: Secondary | ICD-10-CM | POA: Diagnosis not present

## 2018-02-23 DIAGNOSIS — N888 Other specified noninflammatory disorders of cervix uteri: Secondary | ICD-10-CM | POA: Diagnosis not present

## 2018-02-23 DIAGNOSIS — C531 Malignant neoplasm of exocervix: Secondary | ICD-10-CM

## 2018-02-23 DIAGNOSIS — N898 Other specified noninflammatory disorders of vagina: Secondary | ICD-10-CM | POA: Diagnosis not present

## 2018-02-23 DIAGNOSIS — Z90722 Acquired absence of ovaries, bilateral: Secondary | ICD-10-CM | POA: Diagnosis not present

## 2018-02-23 DIAGNOSIS — Z114 Encounter for screening for human immunodeficiency virus [HIV]: Secondary | ICD-10-CM

## 2018-02-23 DIAGNOSIS — D649 Anemia, unspecified: Secondary | ICD-10-CM | POA: Diagnosis not present

## 2018-02-23 DIAGNOSIS — Z1159 Encounter for screening for other viral diseases: Secondary | ICD-10-CM

## 2018-02-23 DIAGNOSIS — Z90710 Acquired absence of both cervix and uterus: Secondary | ICD-10-CM | POA: Diagnosis not present

## 2018-02-23 DIAGNOSIS — R948 Abnormal results of function studies of other organs and systems: Secondary | ICD-10-CM | POA: Diagnosis not present

## 2018-02-23 DIAGNOSIS — N820 Vesicovaginal fistula: Secondary | ICD-10-CM | POA: Diagnosis not present

## 2018-02-23 DIAGNOSIS — Z23 Encounter for immunization: Secondary | ICD-10-CM | POA: Diagnosis not present

## 2018-02-23 DIAGNOSIS — C539 Malignant neoplasm of cervix uteri, unspecified: Secondary | ICD-10-CM

## 2018-02-23 DIAGNOSIS — R000 Tachycardia, unspecified: Secondary | ICD-10-CM | POA: Diagnosis not present

## 2018-02-23 LAB — POCT GLYCOSYLATED HEMOGLOBIN (HGB A1C): HEMOGLOBIN A1C: 6.2 % — AB (ref 4.0–5.6)

## 2018-02-23 MED ORDER — METFORMIN HCL ER 500 MG PO TB24: 500.0000 mg | ORAL_TABLET | Freq: Every day | ORAL | 5 refills | Status: DC

## 2018-02-23 NOTE — Patient Instructions (Signed)
Ndulos tiroideos (Thyroid Nodule) Un ndulo tiroideo es un crecimiento aislado de clulas tiroideas que forman un bulto en la glndula tiroidea. La glndula tiroidea tiene forma de [name] y est [name] en la parte delantera inferior del cuello. Esta glndula enva mensajeros qumicos (hormonas) a travs de la sangre a todo el organismo. Estas hormonas son importantes en la regulacin de la temperatura corporal y ayudan al cuerpo a [name]. Los ndulos tiroideos son frecuentes. Amy Morrow no son cancerosos (son benignos). Una persona puede tener uno o varios ndulos. Hay diferentes tipos de ndulos tiroideos, entre ellos:  Ndulos que crecen y se llenan de lquido (quistes tiroideos).  Ndulos que producen una cantidad excesiva de hormona tiroidea (ndulos calientes o hipertiroideos).  Ndulos que no producen hormona tiroidea (ndulos fros o hipotiroideos).  Ndulos que se forman a partir de Agricultural consultant (cncer de tiroides). CAUSAS Por lo general, se desconoce la causa de esta afeccin. FACTORES DE RIESGO Los factores que aumentan la probabilidad de que esta afeccin se desarrolle incluyen lo siguiente:  La edad. Los ndulos tiroideos son ms frecuentes en [name].  El sexo. ? Los ndulos tiroideos benignos son ms frecuentes en las mujeres, ? y los cancerosos (malignos), en los hombres.  Antecedentes familiares que abarcan lo siguiente: ? Ndulos tiroideos. ? Feocromocitoma. ? Carcinoma de tiroides. ? Hiperparatiroidismo.  Determinados tipos de enfermedades tiroideas, como tiroiditis de [name].  Carencia de yodo.  Antecedentes de radiacin en la cabeza y el cuello, por ejemplo, rayosX. SNTOMAS Es frecuente que esta afeccin no cause sntomas. Si tiene sntomas, estos pueden [name] siguientes:  Un bulto en la parte inferior del cuello.  Sensacin de tener un bulto o un cosquilleo en la garganta.  Dolor en el cuello, la  mandbula o el odo.  Dificultad para tragar. Los ndulos calientes pueden causar algunos de los siguientes sntomas:  Prdida de [name].  Piel enrojecida y caliente.  Sensacin de calor.  Nerviosismo.  Frecuencia cardaca acelerada. Los ndulos fros pueden causar algunos de los siguientes sntomas:  Aumento de [name].  Piel seca.  Debilitamiento del cabello que tambin puede presentarse con la cada del cabello.  Sensacin de fro.  Fatiga. Los ndulos tiroideos cancerosos pueden causar [name] de los siguientes sntomas:  Ndulos duros que parecen estar adheridos a la glndula tiroidea.  Ronquera.  Bultos en los ganglios cercanos a la tiroides (ganglios linfticos). DIAGNSTICO El mdico puede palpar un ndulo tiroideo durante un examen fsico. Esta afeccin tambin se puede diagnosticar en funcin de los sntomas. Tambin pueden hacerle exmenes que incluyen los siguientes:  Magazine features editor. Se puede realizar para [name].  Biopsia. Se toma una muestra del ndulo y se la examina con un microscopio para determinar si el ndulo es benigno.  Anlisis de sangre para saber si la tiroides funciona correctamente.  Se pueden realizar estudios de diagnstico por imgenes, como una resonancia magntica (RM) o una tomografa computarizada (TC) en los siguientes casos: ? El ndulo tiroideo es grande. ? El ndulo tiroideo est obstruyendo las vas respiratorias. ? Se sospecha la presencia de cncer. [name] causa y del tamao del o de los ndulos. Si el ndulo es benigno, tal vez no se necesite tratamiento. El mdico puede controlar el ndulo para saber si desaparece sin tratamiento. Si el ndulo sigue creciendo, es canceroso o no desaparece:  Tal vez haya que drenarlo con [name].  Es posible que haya que extirparlo con [name]. Si se  somete a [place], puede que tambin haya que extirpar una parte o la totalidad de la glndula  tiroides. INSTRUCCIONES PARA EL CUIDADO EN EL HOGAR  Est atento a cualquier cambio en el ndulo.  Tome los medicamentos de venta libre y los recetados solamente como se lo haya indicado el mdico.  Consulting civil engineer a todas las visitas de control como se lo haya indicado el mdico. Esto es importante. SOLICITE ATENCIN MDICA SI:  Le cambia la voz.  Presenta dificultad para tragar.  Siente dolor en el cuello, el odo o la mandbula que se vuelve ms intenso.  El ndulo se agranda.  El ndulo empieza a dificultarle la respiracin. SOLICITE ATENCIN MDICA DE INMEDIATO SI:  Tiene fiebre que aparece de repente.  Se siente muy dbil.  Parece que los msculos se le estn encogiendo (prdida de la masa muscular).  Tiene cambios en el estado de nimo.  Est muy inquieto.  Se siente confundido.  Ve u oye cosas que otras personas no ven ni oyen (tiene alucinaciones).  Tiene nuseas que aparecen de repente o vomita.  Tiene diarrea sbitamente.  Siente dolor en el pecho.  Pierde la conciencia. Esta informacin no tiene Marine scientist el consejo del mdico. Asegrese de hacerle al mdico cualquier pregunta que tenga. Document Released: 07/07/2012 Document Revised: 04/11/2015 Document Reviewed: 11/01/2014 Elsevier Interactive Patient Education  2018 [name].

## 2018-02-23 NOTE — Patient Instructions (Addendum)
1) You will receive a phone call from the hospital to arrange for interventional radiology to place nephrostomy tubes.  2) We will also arrange for you to meet with a primary care provider to begin the workup for the thyroid nodule found on your scan before surgery.  3) Plan to follow up with Dr. Denman George on March 05, 2018 or sooner if needed.   Percutaneous Nephrostomy Percutaneous nephrostomy is a procedure to insert a flexible tube into your kidney so that urine can leave your body. This procedure may be done if a medical condition prevents urine from leaving your kidney in the usual way. Urine is normally carried from the kidneys to the bladder through narrow tubes called ureters. A ureter can become blocked because of conditions such as kidney stones, tumors, infection, or blood clots. The nephrostomy tube will be inserted through your back. After the procedure, the tube will remain in place, and urine will drain from the kidney into a drainage bag outside your body. Draining the urine will relieve pressure and help prevent infection that could damage the kidney. Often, this procedure allows your health care provider to identify the cause of the blockage and plan appropriate treatment. Tell a health care provider about:  Any allergies you have.  All medicines you are taking, including vitamins, herbs, eye drops, creams, and over-the-counter medicines.  Any problems you or family members have had with anesthetic medicines.  Any blood disorders you have.  Any surgeries you have had.  Any medical conditions you have.  Whether you are pregnant or may be pregnant. What are the risks? Generally, this is a safe procedure. However, problems may occur, including:  Infection.  Bleeding.  Allergic reactions to medicines or dyes used in the procedure.  Damage to other structures or organs.  What happens before the procedure?  Follow instructions from your health care provider about eating  or drinking restrictions.  Ask your health care provider about: ? Changing or stopping your regular medicines. This is especially important if you are taking diabetes medicines or blood thinners. ? Taking medicines such as aspirin and ibuprofen. These medicines can thin your blood. Do not take these medicines before your procedure if your health care provider instructs you not to.  You may be given antibiotic medicine to help prevent infection.  You may have blood tests to see how well your kidneys and liver are working and to see how well your blood can clot.  Plan to have someone take you home from the hospital or clinic. What happens during the procedure?  To reduce your risk of infection: ? Your health care team will wash or sanitize their hands. ? Your skin will be washed with soap.  An IV tube will be inserted into one of your veins. You may be given medicines through this IV tube to help prevent nausea and pain.  You will be positioned on your abdomen.  You will be given one or more of the following: ? A medicine to help you relax (sedative). ? A medicine to numb the area (local anesthetic) where the nephrostomy tube will be inserted.  The nephrostomy tube, which is thin and flexible, will be inserted into a needle.  The needle will be inserted into your body and guided to your kidney. An imaging method that uses X-ray images (fluoroscopy) will be used to help guide the needle to the kidney.  A dye will be injected through the nephrostomy tube. Then, X-ray images that highlight your  kidney will be taken.  Next, the needle will be removed, but the nephrostomy tube will be left in your kidney. The tube may be secured to your skin with stitches (sutures).  A drainage bag will be attached to the nephrostomy tube. Urine will be able to drain from your kidney to this drainage bag outside your body. The procedure may vary among health care providers and hospitals. What happens  after the procedure?  Your blood pressure, heart rate, breathing rate, and blood oxygen level will be monitored until the medicines you were given have worn off.  You will need to remain lying down for several hours.  You will be taught how to care for the nephrostomy tube and the drainage bag.  Donot drive for 24 hours if you were given a sedative. This information is not intended to replace advice given to you by your health care provider. Make sure you discuss any questions you have with your health care provider. Document Released: 05/11/2013 Document Revised: 05/02/2016 Document Reviewed: 05/02/2016 Elsevier Interactive Patient Education  2018 [employer].   Percutaneous Nephrostomy Home Guide  Percutaneous nephrostomy is a procedure to insert a flexible tube into your kidney so that urine can leave your body. This procedure may be done if a medical condition prevents urine from leaving your kidney in the usual way. During the procedure, the nephrostomy tube is inserted in the right or left side of your lower back and is connected to an external drainage bag. After you have a nephrostomy tube placed, urine will collect in the drainage bag outside of your body. You will need to empty and change the drainage bag as needed. You will also need to take steps to care for the area where the nephrostomy tube was inserted (tube insertion site). How do I care for my nephrostomy tube?  Always keep your tubing, the leg bag, or the bedside drainage bag below the level of your kidney so that your urine drains freely.  Avoid activities that would cause bending or pulling of your tubing. Ask your health care provider what activities are safe for you.  When connecting your nephrostomy tube to a drainage bag, make sure that there are no kinks in the tubing and that your urine is draining freely. You may want to gently wrap an elastic bandage over the tubing. This will help keep the tubing in place and  prevent it from kinking. Make sure there is no tension on the tubing so it does not become dislodged.  At night, you may want to connect your nephrostomy tube or the leg bag to a larger bedside drainage bag. How do I empty the drainage bag? Empty the leg bag or bedside drainage bag whenever it becomes ? full. Also empty it before you go to sleep. Most drainage bags have a drain at the bottom that allows urine to be emptied. Follow these basic steps: 1. Hold the drainage bag over a toilet or collection container. Use a measuring container if your health care provider told you to measure your urine. 2. Open the drain of the bag and allow the urine to drain out. 3. After all the urine has drained from the drainage bag, close the drain fully. 4. Flush the urine down the toilet. If a collection container was used, rinse the container.  How do I change the dressing around the nephrostomy tube? Change your dressing and clean your tube exit site as told by your health care provider. You may need  to change the dressing every day for the first 2 weeks after having a nephrostomy tube inserted. After the first 2 weeks, you may be told to change the dressing two times a week. Supplies needed:  Mild soap and water.  Split gauze pads, 4  4 inches (10 x 10 cm).  Gauze pads, 4  4 inches (10 x 10 cm).  Paper tape. How to change the dressing: Because of the location of your nephrostomy tube, you may need help from another person to complete dressing changes. Follow these basic steps: 1. Wash hands with soap and water. 2. Gently remove the tape and dressing from around the nephrostomy tube. Be careful not to pull on the tube while removing the dressing. Avoid using scissors because they may damage the tube. 3. Wash the skin around the tube with mild soap and water, rinse well, and pat the skin dry with a clean cloth. 4. Check the skin around the drain for redness, swelling, pus, warmth, or a bad  smell. 5. If the drain was sutured to the skin, check the suture to verify that it is still anchored in the skin. 6. Place two split gauze pads in and around the tube exit site. Do not apply ointments or alcohol to the site. 7. Place a gauze pad on top of the split gauze pad. 8. Coil the tube on top of the gauze. The tubing should rest on the gauze, not on the skin. 9. Place tape around each edge of the gauze pad. 10. Secure the nephrostomy tubing. Make sure that the tube does not kink or become pinched. The tubing should rest on the gauze pad, not on the skin. 11. Dispose of used supplies properly.  How do I replace the drainage bag? Replace the drainage bag, three-way stopcock, and any extension tubing as told by your health care provider. Make sure you always have an extra drainage bag and connecting tubing available. 1. Empty urine from your drainage bag. 2. Gather a new drainage bag, three-way stopcock, and any extension tubing. 3. Remove the drainage bag, three-way stopcock, and any extension tubing from the nephrostomy tube. 4. Attach the new leg bag or bedside drainage bag, three-way stopcock, and any extension tubing to the nephrostomy tube. 5. Dispose of the used drainage bag, three-way stopcock, and any extension.  Contact a health care provider if:  You have problems with any of the valves or tubing.  You have persistent pain or soreness in your back.  You have more redness, swelling, or pain around your tube insertion site.  You have more fluid or blood coming from your tube insertion site.  Your tube insertion site feels warm to the touch.  You have pus or a bad smell coming from your tube insertion site.  You have increased urine output or you feel burning when urinating. Get help right away if:  You have pain in your abdomen during the first week.  You have chest pain or have trouble breathing.  You have a new appearance of blood in your urine.  You have a  fever or chills.  You have back pain that is not relieved by your medicine.  You have decreased urine output.  Your nephrostomy tube comes out. This information is not intended to replace advice given to you by your health care provider. Make sure you discuss any questions you have with your health care provider. Document Released: 05/11/2013 Document Revised: 05/02/2016 Document Reviewed: 05/02/2016 Elsevier Interactive Patient Education  2018 Amy Morrow (Percutaneous Nephrostomy) La nefrostoma percutnea es la insercin de un tubo flexible en los riones, a travs de la espalda. Esto se realiza para acceder a un rin obstruido. El objetivo de este procedimiento es permitir la evacuacin de la orina que se produce en el rin, lo que evitar que la presin o la infeccin daen el rin. Esto le permitir al [employer] identificar la causa de la obstruccin y planificar un tratamiento adecuado. INFORME A SU MDICO:  Cualquier alergia que tenga.  Todos los [employer], incluidos vitaminas, hierbas, gotas oftlmicas, cremas y medicamentos de venta libre.  Problemas previos que usted o los [employer] de su familia hayan tenido con el uso de anestsicos.  Enfermedades de la sangre que tenga.  Cirugas previas.  Enfermedades que tenga.  Posibilidad de embarazo, si corresponde. RIESGOS Y COMPLICACIONES En general, se trata de un procedimiento seguro. Sin embargo, Engineer, technical sales, pueden surgir problemas. Algunos posibles problemas incluyen:  Infeccin.  Daos a los rganos que rodean al rin. ANTES DEL PROCEDIMIENTO Su mdico podra indicarle que se realice anlisis de sangre. Estos anlisis pueden ayudar a mostrar el funcionamiento de sus riones e hgado. Tambin pueden determinar la eficacia con la que coagula la [place]. Si toma anticoagulantes, tambin llamados disolventes de la [place], pregntele a su mdico cundo debe  interrumpirlos. [place] a alguna persona que lo lleve a su casa despus del procedimiento, si es necesario. PROCEDIMIENTO El procedimiento se realiza de la siguiente forma:  Se Geographical information systems officer un catter intravenoso (IV) en una de las venas del brazo, a travs del cual los medicamentos pasarn directamente hacia el cuerpo. Posiblemente se le administren medicamentos a travs de esta va para ayudar a prevenir las nuseas y Conservation officer, historic buildings, y antibiticos para ayudar a [place].  Se lo colocar boca abajo y se le dar un medicamento para Camera operator (anestesia local) donde se insertar el tubo de nefrostoma percutnea.  Le administrarn un medicamento que lo har dormir (anestesia general).  El tubo de nefrostoma percutnea, que es delgado y flexible, se insertar en una aguja.  La aguja se Nurse, learning disability cuerpo y ser guiada hacia el rin con la ayuda de un mtodo de diagnstico por imgenes que [place] imgenes de rayosX (radioscopia).  Se inyectar una sustancia de contraste a travs del tubo de nefrostoma. Luego, se tomarn las imgenes de rayos X que resalten el rin.  Despus se retira la aguja, pero se dejar el tubo de nefrostoma en el rin. El tubo drenar la orina del rin a una bolsa de recoleccin externa. Generalmente, el tubo se fija a la piel con puntos (suturas). DESPUS DEL PROCEDIMIENTO   Equities trader en un rea de recuperacin hasta que desaparezca el efecto de la anestesia. Controlarn su presin arterial y el pulso.  Deber permanecer recostado durante varias horas. Esta informacin no tiene Marine scientist el consejo del mdico. Asegrese de hacerle al mdico cualquier pregunta que tenga. Document Released: 05/11/2013 Document Revised: 08/11/2014 Elsevier Interactive Patient Education  2017 [name].

## 2018-02-23 NOTE — Progress Notes (Signed)
Subjective:  Patient ID: Amy Morrow, female    DOB: 02-20-1961  Age: 57 y.o. MRN: [id number]  CC: thyroid nodule  HPI Amy Morrow is a 57 y.o. female with a medical history of anemia, cervical cancer, HLD, ureterovaginal fistula, and s/p bilateral ureteral stent placement presents as a new patient with concern for thyroid nodule found incidentally on PET scan that was conducted for initial tx strategy for cervical cancer. Pt feeling fatigued. Last Hgb 9.8 g/dL. Had recent ureterovaginal fistula operation done. Reports pain in the laparoscopic incision sites. Denies incision site dehiscence, bleeding, suppuration, cellulitis, induration, or f/c/n/v. Does not endorse any other symptoms or complaints.      Outpatient Medications Prior to Visit  Medication Sig Dispense Refill  . acetaminophen (TYLENOL) 500 MG tablet Take 500 mg by mouth every 6 (six) hours as needed for fever.    Marland Kitchen amoxicillin-clavulanate (AUGMENTIN) 875-125 MG tablet Take 1 tablet by mouth 2 (two) times daily. 14 tablet 0  . docusate sodium (COLACE) 100 MG capsule Take 1 capsule (100 mg total) by mouth 2 (two) times daily. 20 capsule 0  . polyethylene glycol (MIRALAX / GLYCOLAX) packet Take 17 g by mouth daily. 14 each 0  . senna (SENOKOT) 8.6 MG TABS tablet Take 1 tablet (8.6 mg total) by mouth at bedtime. 120 each 0   No facility-administered medications prior to visit.      ROS Review of Systems  Constitutional: Positive for malaise/fatigue. Negative for chills and fever.  Eyes: Negative for blurred vision.  Respiratory: Negative for shortness of breath.   Cardiovascular: Negative for chest pain and palpitations.  Gastrointestinal: Positive for abdominal pain (lower abdominal). Negative for nausea.  Genitourinary: Negative for dysuria and hematuria.  Musculoskeletal: Negative for joint pain and myalgias.  Skin: Negative for rash.  Neurological: Negative for tingling and headaches.   Psychiatric/Behavioral: Negative for depression. The patient is not nervous/anxious.     Objective:  BP 112/79 (BP Location: Left Arm, Patient Position: Sitting, Cuff Size: Normal)   Pulse (!) 119   Temp 98.3 F (36.8 C) (Oral)   Ht 4\' 11"  (1.499 m)   Wt 144 lb 9.6 oz (65.6 kg)   LMP 11/16/2012   SpO2 98%   BMI 29.21 kg/m   BP/Weight 02/23/2018 02/23/2018 2/53/6644  Systolic BP 034 742 595  Diastolic BP 79 83 66  Wt. (Lbs) 144.6 142 -  BMI 29.21 28.68 -      Physical Exam  Constitutional: She is oriented to person, place, and time.  Well developed, well nourished, NAD, polite, appears fatgued  HENT:  Head: Normocephalic and atraumatic.  Eyes: No scleral icterus.  Neck: Normal range of motion. Neck supple. No thyromegaly present.  Small nodule felt on right lobe of thyroid  Cardiovascular: Normal rate, regular rhythm and normal heart sounds.  Pulmonary/Chest: Effort normal and breath sounds normal.  Abdominal: Soft. Bowel sounds are normal. There is tenderness (lower abdominal TTP greater around incision sites).  Musculoskeletal: She exhibits no edema.  Neurological: She is alert and oriented to person, place, and time.  Skin: Skin is warm and dry. No rash noted. No erythema. No pallor.  No sign of wound dehiscence, suppuration, bleeding, cellulitis, or induration around laparoscopic incisions or generally at the lower abdomen.  Psychiatric: Her behavior is normal. Thought content normal.  Somewhat depressed, constricted affect  Vitals reviewed.    Assessment & Plan:   1. Abnormal positron emission tomography (PET) scan - Thyroid Panel With TSH -  US Thyroid Biopsy; Future  2. Prediabetes - HgB A1c 6.2% today.  - Begin metFORMIN (GLUCOPHAGE XR) 500 MG 24 hr tablet; Take 1 tablet (500 mg total) by mouth daily with breakfast.  Dispense: 30 tablet; Refill: 5  3. Anemia, unspecified type - CBC with Differential  4. Screening for HIV (human immunodeficiency  virus) - HIV antibody  5. Need for hepatitis C screening test - Hepatitis c antibody (reflex)  6. Need for Tdap vaccination - Tdap vaccine greater than or equal to 7yo IM   Meds ordered this encounter  Medications  . metFORMIN (GLUCOPHAGE XR) 500 MG 24 hr tablet    Sig: Take 1 tablet (500 mg total) by mouth daily with breakfast.    Dispense:  30 tablet    Refill:  5    Order Specific Question:   Supervising Provider    Answer:   Charlott Rakes [[id number]]    Follow-up: 4 weeks for anemia and thyroid nodule   [name] PA

## 2018-02-23 NOTE — Progress Notes (Signed)
Follow-up Note: GYN-ONC  Consult was requested by Dr. Darron Doom, MD   CC:  Chief Complaint  Patient presents with  . Malignant neoplasm of exocervix ([place])  . ureterovaginal fistula   Patient was seen with an interpretor.   Assessment/Plan: 1. Stage IB1 cervical carcinoma o S/p radical hysterectomy with pelvic lymphadenectomy  o Low risk features, no adjuvant therapy recommended 2. Ureterovaginal fistula (bilateral) o Right> left o She has stents bilaterally. o The right stent is visible in the vagina which represents a significant defect and communication with the vagina. o She is very uncomfortable with urinary leakage. o Recommend perc nephrostomy tube placement to assist in decreasing urine flow through the vagina, and optimize likelihood of healing of the right ureter. I will see her back in 2 weeks for evaluation.  HPI: [name]  is a very nice 57 y.o.  P5  She has been undergoing cervical cancer screening through [place] [facility]. 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 01/2012 LSIL + endometrial cells 07/2012 Pap negative 01/2013 Pap negative with HRHPV not detected 01/2015 Pap negative and HRHPV not detected  Was noting postmenopausal bleeding and saw [place] again. 12/2017 Pap negative now HRHPV detected - Mass noted on exam and she was referred to the [place] [facility] where Dr. 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   Referred for management. Only complaint is the PMB. Denies pain. States bleeding minimal.  Interval Hx:  She underwent a PET/CT which showed no apparent metastatic disease. On 02/08/18 she underwent robotic assisted radical hysterectomy, upper vaginectomy, bilateral pelvic lymphadenectomy.  Operative findings were significant for a 2 to 3 cm pedunculated exophytic mass from the right anterior cervix.  There is no clinical involvement of the parametrium and no suspicious nodes.  The surgery  was overall fairly uncomplicated there was some increased bleeding encountered during the dissection around the posterior bladder pillars.  At the completion of the procedure the ureters bilaterally appeared intact and were completely skeletonized in the distal third free from all parametrial tissue.  Patient was readmitted on postop day 7 with bilateral ureterovaginal fistulas is confirmed on both CT urogram, as well as cystoscopy with retrograde pyelography with Dr. Louis Meckel.  During that cystoscopic procedure Dr. Louis Meckel placed ureteral stents bilaterally.  The patient was treated with broad-spectrum antibiotics to cover pelvic infection.  She was discharged after a 3-day hospital stay.  She returns today for postoperative evaluation.  She reports that she is leaking profuse volumes of urine from the vagina and not voiding through the bladder at all.  The urine is blood stained.  She has minimal pain that is stable.  She denies fevers or chills.  She denies back pain.    Current Meds:  Outpatient Encounter Medications as of 02/23/2018  Medication Sig  . acetaminophen (TYLENOL) 500 MG tablet Take 500 mg by mouth every 6 (six) hours as needed for fever.  Marland Kitchen amoxicillin-clavulanate (AUGMENTIN) 875-125 MG tablet Take 1 tablet by mouth 2 (two) times daily.  Marland Kitchen docusate sodium (COLACE) 100 MG capsule Take 1 capsule (100 mg total) by mouth 2 (two) times daily.  . polyethylene glycol (MIRALAX / GLYCOLAX) packet Take 17 g by mouth daily.  Marland Kitchen senna (SENOKOT) 8.6 MG TABS tablet Take 1 tablet (8.6 mg total) by mouth at bedtime.  . [DISCONTINUED] oxyCODONE (OXY IR/ROXICODONE) 5 MG immediate release tablet Take 1-2 tablets (5-10 mg total) by mouth every 4 (four)  hours as needed for moderate pain or severe pain. (Patient not taking: Reported on 02/23/2018)   No facility-administered encounter medications on file as of 02/23/2018.     Allergy: No Known Allergies  Social Hx:  Tobacco use: none Alcohol use:  none Illicit Drug use: none Illicit IV Drug use: none  Past Surgical Hx:  Past Surgical History:  Procedure Laterality Date  . CYSTOSCOPY W/ RETROGRADES Bilateral 02/16/2018   Procedure: CYSTOSCOPY WITH RETROGRADE PYELOGRAM BILATERAL DIAGNOSTIC URETEROSCOPY, BILATERAL  STENT PLACEMENT;  Surgeon: Ardis Hughs, MD;  Location: [facility];  Service: Urology;  Laterality: Bilateral;  . D & C LEEP CONIZATION BX  07-31-2003   dr p. rose  [facility]  . PELVIC LYMPH NODE DISSECTION Bilateral 02/08/2018   Procedure: BILATEAL PEVIC  LYMPHADENECTOMY;  Surgeon: [name], MD;  Location: [facility];  Service: Gynecology;  Laterality: Bilateral;  . ROBOTIC ASSISTED TOTAL HYSTERECTOMY WITH BILATERAL SALPINGO OOPHERECTOMY N/A 02/08/2018   Procedure: XI ROBOTIC ASSISTED TOTAL Radical HYSTERECTOMY WITH BILATERAL SALPINGO OOPHORECTOMY;  Surgeon: [name], MD;  Location: [facility];  Service: Gynecology;  Laterality: N/A;  . Transvaginal tape placement  03-12-2009  dr Emeterio Reeve  [facility]   GYNECARE TENSION-FREE VAGINAL TAPE SLING  . TUBAL LIGATION  05-28-2005  DR  MARSHALL  @ [facility]   PPTL    Past Medical Hx:  Past Medical History:  Diagnosis Date  . Abnormal Pap smear   . Anemia   . Cervical cancer [facility]) oncologist- dr Denman George   Stage IB1  SCCa   . Hyperlipidemia   . Urgency of urination    intermittant    Past Gynecological History:   GYNECOLOGIC HISTORY:  Patient's last menstrual period was 11/16/2012. Menarche: 57 years old P 5 LMP 57 yo Contraceptive 5 years OCP HRT none  Last Pap see HPI  Family Hx:  Family History  Problem Relation Age of Onset  . Hypertension Mother   . Heart disease Mother   . Hypertension Brother   . Heart disease Brother     Review of Systems:  Review of Systems  Constitutional: Negative.   HENT:  Negative.   Eyes: Negative.   Respiratory: Negative.   Cardiovascular: Negative.   Gastrointestinal: Positive for constipation.  Endocrine: Negative.   Genitourinary: Positive  for vaginal bleeding.        Leakage of urine from vagina  Musculoskeletal: Negative.   Skin: Negative.   Neurological: Negative.   Hematological: Negative.   Psychiatric/Behavioral: Positive for depression.    Vitals:  Blood pressure 123/83, pulse (!) 118, temperature 98.5 F (36.9 C), temperature source Oral, resp. rate 20, height 4\' 11"  (1.499 m), weight 142 lb (64.4 kg), last menstrual period 11/16/2012, SpO2 100 %. Body mass index is 28.68 kg/m.   Physical Exam: ECOG PERFORMANCE STATUS: 1 - Symptomatic but completely ambulatory   General :  Well developed, 57 y.o., female in no apparent distress HEENT:  Normocephalic/atraumatic, symmetric, EOMI, eyelids normal Neck:   Supple, no masses.  Lymphatics:  No cervical/ submandibular/ supraclavicular/ infraclavicular/ inguinal adenopathy Respiratory:  Respirations unlabored, no use of accessory muscles CV:   Deferred Breast:  Deferred Musculoskeletal: No CVA tenderness, normal muscle strength. Abdomen:  Soft, non-tender and nondistended. No evidence of hernia. No masses. Well healed incisions.  Extremities:  No lymphedema, no erythema, non-tender. Skin:   Normal inspection Neuro/Psych:  No focal motor deficit, no abnormal mental status. Normal gait. Normal affect. Alert and oriented to person, place, and time  Genito Urinary: Speculum  exam reveals large volume urine and blood. When evacuated the cuff appears intact at the apex, however on the right, the stent is visible for a 2cm length at the right vaginal fornix. There is no defect on the left. No active bleeding.  Rectovaginal:  deferred    Thereasa Solo, MD  02/23/2018, 1:26 PM    Cc: Darron Doom, MD (Referring Ob/Gyn)

## 2018-02-23 NOTE — Telephone Encounter (Signed)
Called in referral for thyroid nodules that were seen on PET scan from 02/03/18  to [place] and appointment was scheduled for today at 2:30 pm.  Called patient with the assistance of [employer] and advised her of appointment time and location.  She said she would like to accept the appointment.

## 2018-02-24 LAB — CBC WITH DIFFERENTIAL/PLATELET
Basophils Absolute: 0.1 10*3/uL (ref 0.0–0.2)
Basos: 1 %
EOS (ABSOLUTE): 0.2 10*3/uL (ref 0.0–0.4)
EOS: 2 %
HEMOGLOBIN: 10.9 g/dL — AB (ref 11.1–15.9)
Hematocrit: 34.1 % (ref 34.0–46.6)
Immature Grans (Abs): 0 10*3/uL (ref 0.0–0.1)
Immature Granulocytes: 1 %
LYMPHS ABS: 1.3 10*3/uL (ref 0.7–3.1)
Lymphs: 17 %
MCH: 29.1 pg (ref 26.6–33.0)
MCHC: 32 g/dL (ref 31.5–35.7)
MCV: 91 fL (ref 79–97)
MONOCYTES: 10 %
Monocytes Absolute: 0.7 10*3/uL (ref 0.1–0.9)
NEUTROS PCT: 69 %
Neutrophils Absolute: 5.5 10*3/uL (ref 1.4–7.0)
Platelets: 771 10*3/uL — ABNORMAL HIGH (ref 150–450)
RBC: 3.75 x10E6/uL — ABNORMAL LOW (ref 3.77–5.28)
RDW: 12.8 % (ref 12.3–15.4)
WBC: 7.8 10*3/uL (ref 3.4–10.8)

## 2018-02-24 LAB — THYROID PANEL WITH TSH
Free Thyroxine Index: 1.6 (ref 1.2–4.9)
T3 Uptake Ratio: 21 % — ABNORMAL LOW (ref 24–39)
T4, Total: 7.6 ug/dL (ref 4.5–12.0)
TSH: 0.894 u[IU]/mL (ref 0.450–4.500)

## 2018-02-24 LAB — HCV COMMENT:

## 2018-02-24 LAB — HEPATITIS C ANTIBODY (REFLEX)

## 2018-02-24 LAB — HIV ANTIBODY (ROUTINE TESTING W REFLEX): HIV Screen 4th Generation wRfx: NONREACTIVE

## 2018-02-26 ENCOUNTER — Telehealth (INDEPENDENT_AMBULATORY_CARE_PROVIDER_SITE_OTHER): Payer: Self-pay

## 2018-02-26 NOTE — Telephone Encounter (Signed)
Call placed using pac[place] interpreter [phone]) patient aware of negative HCV and HIV, chronic anemia stable. Normal thyroid function but stills need to have thyroid US done. Still waiting for medicaid decision on if they will pay for imaging. Will call you as soon as a decision is made and get the appointment scheduled. Nat Christen, CMA

## 2018-02-26 NOTE — Telephone Encounter (Signed)
-----   Message from [name], [name] sent at 02/24/2018  8:33 AM EDT ----- HIV and HCV negative. Normal thyroid function. Still needs to have thyroid US done.  Chronic anemia stable.

## 2018-02-28 ENCOUNTER — Other Ambulatory Visit: Payer: Self-pay | Admitting: Radiology

## 2018-03-02 ENCOUNTER — Inpatient Hospital Stay (HOSPITAL_COMMUNITY): Admission: EM | Admit: 2018-03-02 | Payer: Self-pay | Source: Ambulatory Visit | Admitting: Internal Medicine

## 2018-03-02 ENCOUNTER — Inpatient Hospital Stay (HOSPITAL_COMMUNITY): Payer: Medicaid Other

## 2018-03-02 ENCOUNTER — Inpatient Hospital Stay (HOSPITAL_COMMUNITY)
Admission: EM | Admit: 2018-03-02 | Discharge: 2018-03-06 | DRG: 871 | Disposition: A | Discharge status: Home / Self Care | Payer: Medicaid Other | Source: Ambulatory Visit | Attending: Internal Medicine | Admitting: Internal Medicine

## 2018-03-02 ENCOUNTER — Ambulatory Visit (HOSPITAL_COMMUNITY)
Admission: RE | Admit: 2018-03-02 | Discharge: 2018-03-02 | Disposition: A | Discharge status: Home / Self Care | Payer: Medicaid Other | Source: Ambulatory Visit | Attending: Gynecologic Oncology | Admitting: Gynecologic Oncology

## 2018-03-02 ENCOUNTER — Encounter (HOSPITAL_COMMUNITY): Payer: Self-pay

## 2018-03-02 DIAGNOSIS — A4181 Sepsis due to Enterococcus: Principal | ICD-10-CM | POA: Diagnosis present

## 2018-03-02 DIAGNOSIS — A419 Sepsis, unspecified organism: Secondary | ICD-10-CM | POA: Diagnosis present

## 2018-03-02 DIAGNOSIS — E876 Hypokalemia: Secondary | ICD-10-CM | POA: Diagnosis present

## 2018-03-02 DIAGNOSIS — Z7984 Long term (current) use of oral hypoglycemic drugs: Secondary | ICD-10-CM | POA: Diagnosis not present

## 2018-03-02 DIAGNOSIS — Z79899 Other long term (current) drug therapy: Secondary | ICD-10-CM | POA: Diagnosis not present

## 2018-03-02 DIAGNOSIS — D62 Acute posthemorrhagic anemia: Secondary | ICD-10-CM | POA: Diagnosis not present

## 2018-03-02 DIAGNOSIS — R6521 Severe sepsis with septic shock: Secondary | ICD-10-CM | POA: Diagnosis present

## 2018-03-02 DIAGNOSIS — E041 Nontoxic single thyroid nodule: Secondary | ICD-10-CM | POA: Diagnosis present

## 2018-03-02 DIAGNOSIS — N821 Other female urinary-genital tract fistulae: Secondary | ICD-10-CM | POA: Diagnosis present

## 2018-03-02 DIAGNOSIS — C539 Malignant neoplasm of cervix uteri, unspecified: Secondary | ICD-10-CM | POA: Diagnosis present

## 2018-03-02 DIAGNOSIS — E119 Type 2 diabetes mellitus without complications: Secondary | ICD-10-CM | POA: Diagnosis present

## 2018-03-02 DIAGNOSIS — J9811 Atelectasis: Secondary | ICD-10-CM | POA: Diagnosis present

## 2018-03-02 DIAGNOSIS — R079 Chest pain, unspecified: Secondary | ICD-10-CM

## 2018-03-02 DIAGNOSIS — N390 Urinary tract infection, site not specified: Secondary | ICD-10-CM | POA: Diagnosis present

## 2018-03-02 DIAGNOSIS — R109 Unspecified abdominal pain: Secondary | ICD-10-CM

## 2018-03-02 DIAGNOSIS — I959 Hypotension, unspecified: Secondary | ICD-10-CM | POA: Diagnosis not present

## 2018-03-02 DIAGNOSIS — E785 Hyperlipidemia, unspecified: Secondary | ICD-10-CM | POA: Diagnosis present

## 2018-03-02 DIAGNOSIS — D649 Anemia, unspecified: Secondary | ICD-10-CM

## 2018-03-02 DIAGNOSIS — Z90710 Acquired absence of both cervix and uterus: Secondary | ICD-10-CM

## 2018-03-02 DIAGNOSIS — N820 Vesicovaginal fistula: Secondary | ICD-10-CM

## 2018-03-02 DIAGNOSIS — C531 Malignant neoplasm of exocervix: Secondary | ICD-10-CM | POA: Diagnosis not present

## 2018-03-02 HISTORY — PX: IR NEPHROSTOMY PLACEMENT RIGHT: IMG6064

## 2018-03-02 LAB — CBC WITH DIFFERENTIAL/PLATELET
BASOS ABS: 0 10*3/uL (ref 0.0–0.1)
BASOS PCT: 1 %
Basophils Absolute: 0 10*3/uL (ref 0.0–0.1)
Basophils Relative: 0 %
EOS ABS: 0.3 10*3/uL (ref 0.0–0.7)
EOS PCT: 4 %
EOS PCT: 0 %
Eosinophils Absolute: 0 10*3/uL (ref 0.0–0.7)
HCT: 31.3 % — ABNORMAL LOW (ref 36.0–46.0)
HEMATOCRIT: 28.4 % — AB (ref 36.0–46.0)
HEMOGLOBIN: 10.4 g/dL — AB (ref 12.0–15.0)
Hemoglobin: 9.2 g/dL — ABNORMAL LOW (ref 12.0–15.0)
LYMPHS ABS: 0.2 10*3/uL — AB (ref 0.7–4.0)
Lymphocytes Relative: 26 %
Lymphocytes Relative: 4 %
Lymphs Abs: 1.6 10*3/uL (ref 0.7–4.0)
MCH: 30.3 pg (ref 26.0–34.0)
MCH: 29.9 pg (ref 26.0–34.0)
MCHC: 33.2 g/dL (ref 30.0–36.0)
MCHC: 32.4 g/dL (ref 30.0–36.0)
MCV: 91.3 fL (ref 78.0–100.0)
MCV: 92.2 fL (ref 78.0–100.0)
MONO ABS: 0 10*3/uL — AB (ref 0.1–1.0)
Monocytes Absolute: 0.4 10*3/uL (ref 0.1–1.0)
Monocytes Relative: 7 %
Monocytes Relative: 0 %
NEUTROS ABS: 4.8 10*3/uL (ref 1.7–7.7)
Neutro Abs: 3.9 10*3/uL (ref 1.7–7.7)
Neutrophils Relative %: 62 %
Neutrophils Relative %: 96 %
PLATELETS: 458 10*3/uL — AB (ref 150–400)
Platelets: 324 10*3/uL (ref 150–400)
RBC: 3.43 MIL/uL — AB (ref 3.87–5.11)
RBC: 3.08 MIL/uL — AB (ref 3.87–5.11)
RDW: 13.8 % (ref 11.5–15.5)
RDW: 13.7 % (ref 11.5–15.5)
WBC: 6.3 10*3/uL (ref 4.0–10.5)
WBC: 5 10*3/uL (ref 4.0–10.5)

## 2018-03-02 LAB — PROTIME-INR
INR: 0.9
PROTHROMBIN TIME: 12 s (ref 11.4–15.2)

## 2018-03-02 LAB — COMPREHENSIVE METABOLIC PANEL
ALT: 27 U/L (ref 0–44)
AST: 40 U/L (ref 15–41)
Albumin: 2.9 g/dL — ABNORMAL LOW (ref 3.5–5.0)
Alkaline Phosphatase: 178 U/L — ABNORMAL HIGH (ref 38–126)
Anion gap: 7 (ref 5–15)
BILIRUBIN TOTAL: 0.9 mg/dL (ref 0.3–1.2)
BUN: 13 mg/dL (ref 6–20)
CHLORIDE: 106 mmol/L (ref 98–111)
CO2: 26 mmol/L (ref 22–32)
Calcium: 8.2 mg/dL — ABNORMAL LOW (ref 8.9–10.3)
Creatinine, Ser: 0.53 mg/dL (ref 0.44–1.00)
GLUCOSE: 115 mg/dL — AB (ref 70–99)
POTASSIUM: 3 mmol/L — AB (ref 3.5–5.1)
Sodium: 139 mmol/L (ref 135–145)
Total Protein: 6.2 g/dL — ABNORMAL LOW (ref 6.5–8.1)

## 2018-03-02 LAB — BASIC METABOLIC PANEL
Anion gap: 8 (ref 5–15)
BUN: 14 mg/dL (ref 6–20)
CO2: 28 mmol/L (ref 22–32)
CREATININE: 0.67 mg/dL (ref 0.44–1.00)
Calcium: 9.2 mg/dL (ref 8.9–10.3)
Chloride: 105 mmol/L (ref 98–111)
Glucose, Bld: 103 mg/dL — ABNORMAL HIGH (ref 70–99)
Potassium: 3.6 mmol/L (ref 3.5–5.1)
Sodium: 141 mmol/L (ref 135–145)

## 2018-03-02 LAB — GLUCOSE, CAPILLARY
GLUCOSE-CAPILLARY: 146 mg/dL — AB (ref 70–99)
Glucose-Capillary: 93 mg/dL (ref 70–99)

## 2018-03-02 LAB — PROCALCITONIN: Procalcitonin: 16.97 ng/mL

## 2018-03-02 LAB — MRSA PCR SCREENING: MRSA BY PCR: NEGATIVE

## 2018-03-02 LAB — LACTIC ACID, PLASMA: Lactic Acid, Venous: 1.5 mmol/L (ref 0.5–1.9)

## 2018-03-02 MED ORDER — NALOXONE HCL 0.4 MG/ML IJ SOLN
INTRAMUSCULAR | Status: AC
  Filled 2018-03-02: qty 1

## 2018-03-02 MED ORDER — ACETAMINOPHEN 325 MG PO TABS
650.0000 mg | ORAL_TABLET | Freq: Four times a day (QID) | ORAL | Status: DC | PRN
  Administered 2018-03-02: 650 mg via ORAL
  Filled 2018-03-02: qty 2

## 2018-03-02 MED ORDER — CEFAZOLIN SODIUM-DEXTROSE 2-4 GM/100ML-% IV SOLN
INTRAVENOUS | Status: AC
  Administered 2018-03-02: 2 g via INTRAVENOUS
  Filled 2018-03-02: qty 100

## 2018-03-02 MED ORDER — SODIUM CHLORIDE 0.9 % IV SOLN
INTRAVENOUS | Status: DC | PRN
  Administered 2018-03-02: 250 mL via INTRAVENOUS

## 2018-03-02 MED ORDER — VANCOMYCIN HCL 10 G IV SOLR
1250.0000 mg | Freq: Once | INTRAVENOUS | Status: AC
  Filled 2018-03-02: qty 1250

## 2018-03-02 MED ORDER — SODIUM CHLORIDE 0.9 % IV SOLN
INTRAVENOUS | Status: DC | PRN
Start: 2018-03-02 — End: 2018-03-03
  Administered 2018-03-02: 500 mL via INTRAVENOUS

## 2018-03-02 MED ORDER — FENTANYL CITRATE (PF) 100 MCG/2ML IJ SOLN
INTRAMUSCULAR | Status: AC | PRN
  Administered 2018-03-02: 25 ug via INTRAVENOUS
  Administered 2018-03-02: 50 ug via INTRAVENOUS
  Administered 2018-03-02: 25 ug via INTRAVENOUS

## 2018-03-02 MED ORDER — IOPAMIDOL (ISOVUE-300) INJECTION 61%
INTRAVENOUS | Status: AC
  Administered 2018-03-02: 20 mL
  Filled 2018-03-02: qty 50

## 2018-03-02 MED ORDER — SODIUM CHLORIDE 0.9 % IV BOLUS: 1000.0000 mL | Freq: Once | INTRAVENOUS | Status: DC

## 2018-03-02 MED ORDER — ACETAMINOPHEN 325 MG PO TABS: 650.0000 mg | ORAL_TABLET | Freq: Four times a day (QID) | ORAL | Status: DC | PRN

## 2018-03-02 MED ORDER — POTASSIUM CHLORIDE CRYS ER 20 MEQ PO TBCR
40.0000 meq | EXTENDED_RELEASE_TABLET | ORAL | Status: AC
Start: 2018-03-02 — End: 2018-03-03
  Administered 2018-03-02 – 2018-03-03 (×2): 40 meq via ORAL
  Filled 2018-03-02 (×2): qty 2

## 2018-03-02 MED ORDER — VANCOMYCIN HCL IN DEXTROSE 1-5 GM/200ML-% IV SOLN
1000.0000 mg | INTRAVENOUS | Status: DC
  Administered 2018-03-03 – 2018-03-05 (×3): 1000 mg via INTRAVENOUS
  Filled 2018-03-02 (×3): qty 200

## 2018-03-02 MED ORDER — LIDOCAINE HCL 1 % IJ SOLN
INTRAMUSCULAR | Status: AC
  Filled 2018-03-02: qty 20

## 2018-03-02 MED ORDER — FLUMAZENIL 0.5 MG/5ML IV SOLN
INTRAVENOUS | Status: AC
  Filled 2018-03-02: qty 5

## 2018-03-02 MED ORDER — ONDANSETRON HCL 4 MG/2ML IJ SOLN
4.0000 mg | Freq: Once | INTRAMUSCULAR | Status: AC
  Administered 2018-03-02: 4 mg via INTRAVENOUS

## 2018-03-02 MED ORDER — MIDAZOLAM HCL 2 MG/2ML IJ SOLN
INTRAMUSCULAR | Status: AC | PRN
  Administered 2018-03-02 (×3): 1 mg via INTRAVENOUS

## 2018-03-02 MED ORDER — INSULIN ASPART 100 UNIT/ML ~~LOC~~ SOLN
0.0000 [IU] | Freq: Three times a day (TID) | SUBCUTANEOUS | Status: DC
  Administered 2018-03-03 – 2018-03-05 (×2): 1 [IU] via SUBCUTANEOUS

## 2018-03-02 MED ORDER — SODIUM CHLORIDE 0.9 % IV BOLUS: 150.0000 mL | Freq: Once | INTRAVENOUS | Status: DC

## 2018-03-02 MED ORDER — SODIUM CHLORIDE 0.9 % IV SOLN
INTRAVENOUS | Status: DC
  Administered 2018-03-02 – 2018-03-04 (×7): via INTRAVENOUS

## 2018-03-02 MED ORDER — IOPAMIDOL (ISOVUE-300) INJECTION 61%
50.0000 mL | Freq: Once | INTRAVENOUS | Status: AC | PRN
  Administered 2018-03-02: 20 mL

## 2018-03-02 MED ORDER — PIPERACILLIN-TAZOBACTAM 3.375 G IVPB
3.3750 g | Freq: Three times a day (TID) | INTRAVENOUS | Status: DC
  Administered 2018-03-02 – 2018-03-06 (×11): 3.375 g via INTRAVENOUS
  Filled 2018-03-02 (×14): qty 50

## 2018-03-02 MED ORDER — MORPHINE SULFATE (PF) 2 MG/ML IV SOLN: 2.0000 mg | INTRAVENOUS | Status: DC | PRN

## 2018-03-02 MED ORDER — ONDANSETRON HCL 4 MG/2ML IJ SOLN
INTRAMUSCULAR | Status: AC
  Filled 2018-03-02: qty 2

## 2018-03-02 MED ORDER — CEFAZOLIN SODIUM-DEXTROSE 2-4 GM/100ML-% IV SOLN
2.0000 g | INTRAVENOUS | Status: AC
  Administered 2018-03-02: 2 g via INTRAVENOUS

## 2018-03-02 MED ORDER — MIDAZOLAM HCL 2 MG/2ML IJ SOLN
INTRAMUSCULAR | Status: AC
  Filled 2018-03-02: qty 4

## 2018-03-02 MED ORDER — HYDROCODONE-ACETAMINOPHEN 5-325 MG PO TABS
1.0000 | ORAL_TABLET | ORAL | Status: DC | PRN
  Administered 2018-03-02: 2 via ORAL
  Filled 2018-03-02: qty 2

## 2018-03-02 MED ORDER — SODIUM CHLORIDE 0.9 % IV SOLN
1250.0000 mg | Freq: Once | INTRAVENOUS | Status: DC
  Administered 2018-03-02: 1250 mg via INTRAVENOUS
  Filled 2018-03-02: qty 1250

## 2018-03-02 MED ORDER — FENTANYL CITRATE (PF) 100 MCG/2ML IJ SOLN
INTRAMUSCULAR | Status: AC
  Filled 2018-03-02: qty 4

## 2018-03-02 MED ORDER — PIPERACILLIN-TAZOBACTAM 3.375 G IVPB 30 MIN
3.3750 g | Freq: Once | INTRAVENOUS | Status: AC
  Administered 2018-03-02: 3.375 g via INTRAVENOUS
  Filled 2018-03-02: qty 50

## 2018-03-02 MED ORDER — MEPERIDINE HCL 50 MG/ML IJ SOLN
INTRAMUSCULAR | Status: AC
  Administered 2018-03-02: 25 mg
  Filled 2018-03-02: qty 1

## 2018-03-02 MED ORDER — PIPERACILLIN-TAZOBACTAM 3.375 G IVPB: 3.3750 g | Freq: Three times a day (TID) | INTRAVENOUS | Status: DC

## 2018-03-02 MED ORDER — SODIUM CHLORIDE 0.9 % IV BOLUS
1000.0000 mL | Freq: Once | INTRAVENOUS | Status: AC
  Administered 2018-03-02: 1000 mL via INTRAVENOUS

## 2018-03-02 MED ORDER — SODIUM CHLORIDE 0.9 % IV SOLN
INTRAVENOUS | Status: DC
  Administered 2018-03-02 (×3): via INTRAVENOUS

## 2018-03-02 MED ORDER — INSULIN ASPART 100 UNIT/ML ~~LOC~~ SOLN: 0.0000 [IU] | Freq: Every day | SUBCUTANEOUS | Status: DC

## 2018-03-02 MED ORDER — VANCOMYCIN HCL IN DEXTROSE 750-5 MG/150ML-% IV SOLN: 750.0000 mg | INTRAVENOUS | Status: DC

## 2018-03-02 MED ORDER — MEPERIDINE HCL 100 MG/ML IJ SOLN: 25.0000 mg | Freq: Once | INTRAMUSCULAR | Status: DC

## 2018-03-02 MED FILL — HYDROCODON-APAP 5-325: 5-325 | 1 days supply | Qty: 15 | Fill #0

## 2018-03-02 NOTE — H&P (Deleted)
  The note originally documented on this encounter has been moved the the encounter in which it belongs.  

## 2018-03-02 NOTE — Progress Notes (Signed)
Pharmacy Antibiotic Note  Amy Morrow is a 57 y.o. female admitted on 03/02/2018 with sepsis.  Pharmacy has been consulted for piperacillin/tazobactam + vancomycin dosing.  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 Nephrostomy tubes placed 7/30 by IR - pt become febrile, tachycardic, and hypotensive post-procedure and was admitted for sepsis.   Today, 03/02/18  WBC 5  PCT 17  Tmax 102.4 F  Plan:  Piperacillin/tazobactam 3.375 g IV q8h EI  Vancomycin 1250 mg IV loading dose followed by 1000 mg IV q24h  Goal AUC 400-500  Monitor cultures, renal function, and vancomycin levels once at steady state     Temp (24hrs), Avg:99.8 F (37.7 C), Min:98.3 F (36.8 C), Max:102.4 F (39.1 C)  Recent Labs  Lab 03/02/18 0952 03/02/18 1641 03/02/18 1702  WBC 6.3 5.0  --   CREATININE 0.67 0.53  --   LATICACIDVEN  --   --  1.5    Estimated Creatinine Clearance: 65.7 mL/min (by C-G formula based on SCr of 0.53 mg/dL).    No Known Allergies  Antimicrobials this admission: vancomycin 7/30 >>  Piperacillin/tazobactam 7/30 >>   Dose adjustments this admission:  Microbiology results: 7/30 BCx: Sent 7/30 UCx: Sent  7/30 MRSA PCR: Sent  Thank you for allowing pharmacy to be a part of this patient's care.  Lenis Noon, PharmD, BCPS Clinical Pharmacist 03/02/2018 7:17 PM

## 2018-03-02 NOTE — Sedation Documentation (Signed)
Patient is resting comfortably with eyes closed in NAD. 

## 2018-03-02 NOTE — Consult Note (Signed)
Chief Complaint: Patient was seen in consultation today for right percutaneous nephrostomy  Referring Physician(s): [name],E  Supervising Physician: Aletta Edouard  Patient Status: [facility] - Out-pt  History of Present Illness: Amy Morrow is a 57 y.o. female with history of squamous cell carcinoma of the cervix, status post robotic assisted radical hysterectomy with upper vaginectomy and pelvic lymphadenectomy on 02/08/2018.  Patient subsequently developed bilateral ureterovaginal fistulas, right greater than left.  She has bilateral ureteral stents in place.  The right ureteral stent is visible in the vagina and patient now has leakage of urine from the vagina as well as some occasional vaginal bleeding.  She presents today for right percutaneous nephrostomy to assist in decreasing urine flow through the vagina and optimize likelihood of healing of the right ureter. Past Medical History:  Diagnosis Date  . Abnormal Pap smear   . Anemia   . Cervical cancer [facility]) oncologist- dr Denman George   Stage IB1  SCCa   . Hyperlipidemia   . Urgency of urination    intermittant    Past Surgical History:  Procedure Laterality Date  . CYSTOSCOPY W/ RETROGRADES Bilateral 02/16/2018   Procedure: CYSTOSCOPY WITH RETROGRADE PYELOGRAM BILATERAL DIAGNOSTIC URETEROSCOPY, BILATERAL  STENT PLACEMENT;  Surgeon: Ardis Hughs, MD;  Location: [facility];  Service: Urology;  Laterality: Bilateral;  . D & C LEEP CONIZATION BX  07-31-2003   dr p. rose  [facility]  . PELVIC LYMPH NODE DISSECTION Bilateral 02/08/2018   Procedure: BILATEAL PEVIC  LYMPHADENECTOMY;  Surgeon: [name], MD;  Location: [facility];  Service: Gynecology;  Laterality: Bilateral;  . ROBOTIC ASSISTED TOTAL HYSTERECTOMY WITH BILATERAL SALPINGO OOPHERECTOMY N/A 02/08/2018   Procedure: XI ROBOTIC ASSISTED TOTAL Radical HYSTERECTOMY WITH BILATERAL SALPINGO OOPHORECTOMY;  Surgeon: [name], MD;  Location: [facility];  Service: Gynecology;  Laterality: N/A;  .  Transvaginal tape placement  03-12-2009  dr Emeterio Reeve  [facility]   GYNECARE TENSION-FREE VAGINAL TAPE SLING  . TUBAL LIGATION  05-28-2005  DR  MARSHALL  @ [facility]   PPTL    Allergies: Patient has no known allergies.  Medications: Prior to Admission medications   Medication Sig Start Date End Date Taking? Authorizing Provider  acetaminophen (TYLENOL) 500 MG tablet Take 500 mg by mouth every 6 (six) hours as needed for fever.   Yes [provider]  docusate sodium (COLACE) 100 MG capsule Take 1 capsule (100 mg total) by mouth 2 (two) times daily. 02/09/18  Yes [name], MD  metFORMIN (GLUCOPHAGE XR) 500 MG 24 hr tablet Take 1 tablet (500 mg total) by mouth daily with breakfast. 02/23/18  Yes [name], [name]  polyethylene glycol [facility] / Floria Raveling) packet Take 17 g by mouth daily. 02/18/18  Yes [name], MD  senna (SENOKOT) 8.6 MG TABS tablet Take 1 tablet (8.6 mg total) by mouth at bedtime. 02/18/18  Yes [name], MD  amoxicillin-clavulanate (AUGMENTIN) 875-125 MG tablet Take 1 tablet by mouth 2 (two) times daily. 02/18/18   [name], MD     Family History  Problem Relation Age of Onset  . Hypertension Mother   . Heart disease Mother   . Hypertension Brother   . Heart disease Brother     Social History   Socioeconomic History  . Marital status: Married    Spouse name: Not on file  . Number of children: Not on file  . Years of education: Not on file  . Highest education level: Not on file  Occupational  History  . Not on file  Social Needs  . Financial resource strain: Not on file  . Food insecurity:    Worry: Not on file    Inability: Not on file  . Transportation needs:    Medical: Not on file    Non-medical: Not on file  Tobacco Use  . Smoking status: Never Smoker  . Smokeless tobacco: Never Used  Substance and Sexual Activity  . Alcohol use: No  . Drug use: No  . Sexual activity: Yes    Birth control/protection: None, Surgical  Lifestyle  .  Physical activity:    Days per week: Not on file    Minutes per session: Not on file  . Stress: Not on file  Relationships  . Social connections:    Talks on phone: Not on file    Gets together: Not on file    Attends religious service: Not on file    Active member of club or organization: Not on file    Attends meetings of clubs or organizations: Not on file    Relationship status: Not on file  Other Topics Concern  . Not on file  Social History Narrative  . Not on file      Review of Systems currently denies fever, headache, chest pain, dyspnea, cough, nausea, vomiting.  She does have mild pelvic and low back discomfort.  Vital Signs: BP 115/78 (BP Location: Right Arm)   Pulse 93   Temp 98.7 F (37.1 C) (Oral)   Resp 18   LMP 11/16/2012   SpO2 100%   Physical Exam awake, alert.  Chest clear to auscultation bilaterally.  Heart with regular rate and rhythm.  Abdomen soft, positive bowel sounds, mild pelvic tenderness to palpation.  No lower extremity edema.  Imaging: Dg Chest 2 View  Result Date: 02/11/2018 CLINICAL DATA:  Fever and headache. EXAM: CHEST - 2 VIEW COMPARISON:  None. FINDINGS: AP and lateral views obtained. Low volumes. Asymmetric elevation right hemidiaphragm. The cardiopericardial silhouette is within normal limits for size. There is pulmonary vascular congestion without overt pulmonary edema. The visualized bony structures of the thorax are intact. IMPRESSION: Low volume film with vascular congestion. Electronically Signed   By: Misty Stanley M.D.   On: 02/11/2018 19:13   Ct Abdomen Pelvis W Contrast  Addendum Date: 02/16/2018   ADDENDUM REPORT: 02/16/2018 18:28 ADDENDUM: The original report was by Dr. Van Clines. The following addendum is by Dr. Van Clines: The clinical team called me at about 5:40 p.m. Eastern time on February 16, 2018 and we discussed the above findings and impressions in detail. Electronically Signed   By: Van Clines  M.D.   On: 02/16/2018 18:28   Result Date: 02/16/2018 CLINICAL DATA:  Drainage from vagina, query ureteral/bladder injury or fistula. EXAM: CT ABDOMEN AND PELVIS WITH CONTRAST TECHNIQUE: Multidetector CT imaging of the abdomen and pelvis was performed using the standard protocol following bolus administration of intravenous contrast. CONTRAST:  133mL ISOVUE-300 IOPAMIDOL (ISOVUE-300) INJECTION 61% COMPARISON:  Cystogram dated 02/16/2018; PET-CT from 02/03/2018 FINDINGS: Lower chest: Unremarkable Hepatobiliary: Hypodensity in segment 4 of the liver near the falciform ligament was not recently hypermetabolic and probably represents focal fatty infiltration. The liver and gallbladder appear otherwise unremarkable. No biliary dilatation. Pancreas: Unremarkable Spleen: Unremarkable Adrenals/Urinary Tract: Adrenal glands normal. Bilateral mild hydroureter and mild left hydronephrosis. There is leakage of contrast medium from the distal ureter vicinity, probably on the left side, into a collection of gas, fluid, and contrast just above  the vaginal cuff. This is actively leaking from the ureter. This cystogram from earlier today did not appear to demonstrate leakage of contrast from the bladder, and accordingly the source of the leak is probably ureteral. The collection of extravasated ureteral contrast, gas, and fluid measures about 8.6 by 4.3 by 4.9 cm just above the vaginal cuff, but also with some separate enhancing pockets including a 3.8 by 2.0 by 2.1 cm probable abscess near the rectosigmoid junction on image 19/5, as well as a 6.6 by 3.3 by 5.8 cm collection of gas and fluid tracking along the left pelvic sidewall. The source of the gas could be infection, or a defect in the vaginal cuff. Either way, this is a set up for infection and abscess formation, and the lesion along the rectosigmoid junction appears to likely be a walled off abscess. I would not normally expected postoperative gas to still be present 8  days out after laparoscopy in the pelvis; the gas that is present is considered abnormal. Stomach/Bowel: There is potentially some secondary wall thickening of the rectosigmoid adjacent to the small abscess. I am skeptical of rectosigmoid as a source for the infection. Appendix normal. Vascular/Lymphatic: Mild abdominal aortic atherosclerotic calcification. Reproductive: Uterus absent.  Ovaries not well seen. Other: No supplemental non-categorized findings. Musculoskeletal: Mild left foraminal stenosis at L5-S1 due to facet and intervertebral spurring. Broad Schmorl's node along the superior endplate of L4. IMPRESSION: 1. Active leak of urinary tract contrast into a collection of fluid and gas just above the vaginal cuff. I favor the contrast leak is coming from the left distal ureter, less likely to be from the right distal ureter. Given the appearance on the earlier cystogram today, the bladder is not thought to be the source of the leak. Irregular collections of fluid and gas in the pelvis suggesting early abscess formation, this gas indicates either infection or leak a coach of gas through the vaginal cuff (which in turn predisposes to infection). This gas is not normal postoperative gas 8 days out after laparoscopy. An encapsulated smaller abscess is present adjacent to the sigmoid colon. 2. Mild bilateral hydroureter and mild left hydronephrosis. This may be due to extrinsic mass effect and inflammation along the distal ureters. 3.  Aortoiliac atherosclerotic vascular disease. 4. Left foraminal impingement at L5-S1 due to spurring. Electronically Signed: By: Van Clines M.D. On: 02/16/2018 17:55   Dg Cystogram  Result Date: 02/16/2018 CLINICAL DATA:  57 year old female post hysterectomy. Vaginal leak. Initial encounter. EXAM: CYSTOGRAM TECHNIQUE: Patient came to radiology suite catheterized. Urinary bladder was filled with 210 mL Cysto-Hypaque 30% by drip infusion. Serial spot images were obtained  during bladder filling and post draining. FLUOROSCOPY TIME:  Fluoroscopy Time:  1 minutes and 6 seconds. Radiation Exposure Index: 39 mGy. COMPARISON:  02/03/2018 PET-CT. FINDINGS: Mild irregularity of the posterior aspect of bladder wall may represent postoperative changes. No urinary bladder leak identified. Contrast drained. IMPRESSION: No urinary bladder leak identified. Irregularity of the posterior aspect of bladder may be related to postoperative changes. Please see follow-up CT report. Electronically Signed   By: Genia Del M.D.   On: 02/16/2018 18:18   Nm Pet Image Initial (pi) Skull Base To Thigh  Result Date: 02/03/2018 CLINICAL DATA:  Initial treatment strategy for cervical cancer. EXAM: NUCLEAR MEDICINE PET SKULL BASE TO THIGH TECHNIQUE: 7.39 mCi F-18 FDG was injected intravenously. Full-ring PET imaging was performed from the skull base to thigh after the radiotracer. CT data was obtained and used for  attenuation correction and anatomic localization. Fasting blood glucose: 115 mg/dl COMPARISON:  None. FINDINGS: Mediastinal blood pool activity: SUV max 2.2 NECK: No hypermetabolic cervical lymph nodes are identified.There are no lesions of the pharyngeal mucosal space. There is focal hypermetabolic activity along the posterior right lobe of the thyroid (SUV max 10.5). This corresponds with a 9 x 13 mm low-density nodule on image 38/4. Incidental CT findings: none CHEST: There are no hypermetabolic mediastinal, hilar or axillary lymph nodes. There is no hypermetabolic pulmonary activity or suspicious pulmonary nodule. Incidental CT findings: none ABDOMEN/PELVIS: There is no hypermetabolic activity within the liver, adrenal glands, spleen or pancreas. There are no enlarged or hypermetabolic abdominopelvic lymph nodes. There is focal hypermetabolic activity within the cervix (SUV max 11.6). No parametrial extension of this activity identified. Incidental CT findings: Borderline hepatic steatosis  without focal abnormality. No hydronephrosis. SKELETON: There is no hypermetabolic activity to suggest osseous metastatic disease. Incidental CT findings: Degenerative disc disease noted at L5-S1. IMPRESSION: 1. Hypermetabolic cervical activity corresponding with the patient's known cervical cancer. No evidence of metastatic disease. 2. Focal hypermetabolic right thyroid (or parathyroid) activity. Hypermetabolic thyroid nodules on PET have up to 40-50% incidence of malignancy; recommend further evaluation with thyroid ultrasound and possible US-guided fine needle aspiration. 3. Borderline hepatic steatosis. Electronically Signed   By: [name]   On: 02/03/2018 10:27   Dg C-arm 1-60 Min-no Report  Result Date: 02/16/2018 Fluoroscopy was utilized by the requesting physician.  No radiographic interpretation.    Labs:  CBC: Recent Labs    02/17/18 0352 02/18/18 0435 02/23/18 1534 03/02/18 0952  WBC 13.6* 8.6 7.8 6.3  HGB 10.5* 9.8* 10.9* 10.4*  HCT 31.5* 30.4* 34.1 31.3*  PLT 548* 552* 771* 458*    COAGS: No results for input(s): INR, APTT in the last 8760 hours.  BMP: Recent Labs    02/16/18 1212 02/17/18 0352 02/18/18 0435 03/02/18 0952  NA 134* 135 137 141  K 3.8 4.3 4.0 3.6  CL 96* 98 98 105  CO2 28 27 30 28   GLUCOSE 99 199* 123* 103*  BUN 12 8 14 14   CALCIUM 9.5 8.9 8.7* 9.2  CREATININE 0.69 0.48 0.71 0.67  GFRNONAA >60 >60 >60 >60  GFRAA >60 >60 >60 >60    LIVER FUNCTION TESTS: Recent Labs    02/02/18 0900 02/11/18 1818 02/16/18 1212 02/17/18 0352  BILITOT 1.0 1.0 0.5 0.4  AST 21 28 79* 54*  ALT 17 26 197* 150*  ALKPHOS 72 65 306* 244*  PROT 7.7 6.7 7.7 7.3  ALBUMIN 4.2 3.1* 3.1* 3.2*    TUMOR MARKERS: No results for input(s): AFPTM, CEA, CA199, CHROMGRNA in the last 8760 hours.  Assessment and Plan: 57 y.o. female with history of squamous cell carcinoma of the cervix, status post robotic assisted radical hysterectomy with upper vaginectomy  and pelvic lymphadenectomy on 02/08/2018.  Patient subsequently developed bilateral ureterovaginal fistulas, right greater than left.  She has bilateral ureteral stents in place.  The right ureteral stent is visible in the vagina and patient now has leakage of urine from the vagina as well as some occasional vaginal bleeding.  She presents today for right percutaneous nephrostomy to assist in decreasing urine flow through the vagina and optimize likelihood of healing of the right ureter.Risks and benefits of procedure were discussed with the patient/spouse via interpreter including, but not limited to, infection, bleeding, significant bleeding causing loss or decrease in renal function or damage to adjacent structures.  All of the patient's questions were answered, patient is agreeable to proceed.  Consent signed and in chart.      Thank you for this interesting consult.  I greatly enjoyed meeting BERKELEY VELDMAN and look forward to participating in their care.  A copy of this report was sent to the requesting provider on this date.  Electronically Signed: D. Rowe Robert, [name] 03/02/2018, 10:29 AM   I spent a total of 30 minutes in face to face in clinical consultation, greater than 50% of which was counseling/coordinating care for right percutaneous nephrostomy

## 2018-03-02 NOTE — Progress Notes (Signed)
Patient ID: Amy Morrow, female   DOB: August 19, 1960, 57 y.o.   MRN: [id number] Patient status post right percutaneous nephrostomy earlier this afternoon.  Patient continues to have some right flank discomfort currently 5 out of 10.  She has also had some nausea and vomiting.  Temperature around 100.  She remains tachycardic.  Blood pressure 109/60.  Urine from right nephrostomy is blood-tinged and has been sent for culture.  She has received Vicodin, IV Demerol as well as IV Zofran.  She received IV Ancef prior to nephrostomy today.  Drs. [name]/Yamagata notified of above findings and recommends admission by hospitalist team.  Call placed.

## 2018-03-02 NOTE — Procedures (Signed)
Interventional Radiology Procedure Note  Procedure: Right percutaneous nephrostomy tube placement  Complications: None  Estimated Blood Loss: < 10 mL  Findings: Mild right hydronephrosis.  No left hydronephrosis.  10 Fr right PCN placed and attached to gravity bag.  Venetia Night. Kathlene Cote, M.D Pager:  [phone]

## 2018-03-02 NOTE — Progress Notes (Signed)
Dr Kathlene Cote and Rowe Robert PA in to see pt.

## 2018-03-02 NOTE — Discharge Instructions (Addendum)
Percutaneous Nephrostomy, Care After This sheet gives you information about how to care for yourself after your procedure. Your health care provider may also give you more specific instructions. If you have problems or questions, contact your health care provider. What can I expect after the procedure? After the procedure, it is common to have:  Some soreness where the nephrostomy tube was inserted (tube insertion site).  Blood-tinged drainage from the nephrostomy tube for the first 24 hours.  Follow these instructions at home: Activity  Return to your normal activities as told by your health care provider. Ask your health care provider what activities are safe for you.  Avoid activities that may cause the nephrostomy tubing to bend.  Do not take baths, swim, or use a hot tub until your health care provider approves. Ask your health care provider if you can take showers. Cover the nephrostomy tube dressing with a watertight covering when you take a shower.  Donot drive for 24 hours if you were given a medicine to help you relax (sedative). Care of the tube insertion site  Follow instructions from your health care provider about how to take care of your tube insertion site. Make sure you: ? Wash your hands with soap and water before you change your bandage (dressing). If soap and water are not available, use hand sanitizer. ? Change your dressing as told by your health care provider. Be careful not to pull on the tube while removing the dressing. ? When you change the dressing, wash the skin around the tube, rinse well, and pat the skin dry.  Check the tube insertion area every day for signs of infection. Check for: ? More redness, swelling, or pain. ? More fluid or blood. ? Warmth. ? Pus or a bad smell. Care of the nephrostomy tube and drainage bag  Always keep the tubing, the leg bag, or the bedside drainage bags below the level of the kidney so that your urine drains  freely.  When connecting your nephrostomy tube to a drainage bag, make sure that there are no kinks in the tubing and that your urine is draining freely. You may want to use an elastic bandage to wrap any exposed tubing that goes from the nephrostomy tube to any of the connecting tubes.  At night, you may want to connect your nephrostomy tube or the leg bag to a larger bedside drainage bag.  Follow instructions from your health care provider about how to empty or change the drainage bag.  Empty the drainage bag when it becomes ? full.  Replace the drainage bag and any extension tubing that is connected to your nephrostomy tube every 3 weeks or as often as told by your health care provider. Your health care provider will explain how to change the drainage bag and extension tubing. General instructions  Take over-the-counter and prescription medicines only as told by your health care provider.  Keep all follow-up visits as told by your health care provider. This is important. Contact a health care provider if:  You have problems with any of the valves or tubing.  You have persistent pain or soreness in your back.  You have more redness, swelling, or pain around your tube insertion site.  You have more fluid or blood coming from your tube insertion site.  Your tube insertion site feels warm to the touch.  You have pus or a bad smell coming from your tube insertion site.  You have increased urine output or you feel  burning when urinating. Get help right away if:  You have pain in your abdomen during the first week.  You have chest pain or have trouble breathing.  You have a new appearance of blood in your urine.  You have a fever or chills.  You have back pain that is not relieved by your medicine.  You have decreased urine output.  Your nephrostomy tube comes out. This information is not intended to replace advice given to you by your health care provider. Make sure you  discuss any questions you have with your health care provider. Document Released: 03/13/2004 Document Revised: 05/02/2016 Document Reviewed: 05/02/2016 Elsevier Interactive Patient Education  2018 [place], cuidados posteriores (Percutaneous Nephrostomy, Care After) Siga estas instrucciones durante las prximas semanas. Estas indicaciones le proporcionan informacin general acerca de cmo deber cuidarse despus del procedimiento. El mdico tambin podr darle instrucciones ms especficas. El tratamiento ha sido planificado segn las prcticas mdicas actuales, pero en algunos casos pueden ocurrir problemas. Comunquese con el mdico si tiene algn problema o tiene dudas despus del procedimiento. QU ESPERAR DESPUS DEL PROCEDIMIENTO Deber permanecer recostado durante varias horas. INSTRUCCIONES PARA EL CUIDADO EN EL HOGAR  El tubo de nefrostoma se conecta a una bolsa sujeta a la pierna o a una bolsa de drenaje que est al costado de la cama. El tubo, la bolsa sujeta a la pierna o la bolsa de drenaje que est al costado de la cama deben estar siempre debajo del nivel de los riones para que la orina drene libremente.  [place], si conecta el tubo de nefrostoma a una bolsa sujeta a la pierna, asegrese de que no haya torceduras en el tubo y de que la orina drene libremente.  De noche, es conveniente que conecte el tubo de nefrostoma o la bolsa sujeta a la pierna a una bolsa de drenaje ms grande para Glass blower/designer al costado de la cama.  Cambie los vendajes con la frecuencia indicada por el mdico o cuando se humedezcan. ? Retire con cuidado las cintas y los vendajes de alrededor del tubo de nefrostoma. Tenga cuidado de no tirar del tubo mientras retira los vendajes. ? Lave la piel que rodea al tubo, enjuguela bien y squela. ? [employer] de drenaje [phone] adentro y alrededor de la salida del tubo. ? Coloque cinta alrededor del borde del  vendaje. ? Fije el tubo de nefrostoma. Recuerde asegurarse de que el tubo de nefrostoma no se tuerza ni se cierre por estar demasiado apretado. Puede ser til [employer] con una venda elstica cualquier parte expuesta del tubo que vaya desde el tubo de nefrostoma a cualquiera de los tubos conectores de la bolsa sujeta a la pierna o la bolsa de [place].  Cada tres semanas, reemplace la bolsa sujeta a la pierna, la bolsa de drenaje y cualquier tubo de extensin que se conecte con el tubo de nefrostoma. El mdico le explicar cmo cambiar la bolsa de drenaje y el tubo de extensin. SOLICITE ATENCIN MDICA SI:  Tiene problemas con cualquiera de las vlvulas o los tubos.  Tiene dolor o molestias persistentes en la espalda.  Siente escalofros o fiebre. SOLICITE ATENCIN MDICA DE INMEDIATO SI:  Siente dolor abdominal durante la primera semana.  Observa nuevamente sangre en la orina.  Tiene dolor de espalda que no se alivia con el medicamento.  Tiene secrecin, enrojecimiento o hinchazn en el lugar de insercin del tubo.  Tiene menor volumen de [place].  Se le sale el tubo de  nefrostoma. Esta informacin no tiene Marine scientist el consejo del mdico. Asegrese de hacerle al mdico cualquier pregunta que tenga. Document Released: 10/17/2008 Document Revised: 08/11/2014 Elsevier Interactive Patient Education  2017 [place] consciente moderada en los adultos, cuidados posteriores (Moderate Conscious Sedation, Adult, Care After) Estas indicaciones le proporcionan informacin acerca de cmo deber cuidarse despus del procedimiento. El mdico tambin podr darle instrucciones ms especficas. El tratamiento ha sido planificado segn las prcticas mdicas actuales, pero en algunos casos pueden ocurrir problemas. Comunquese con el mdico si tiene algn problema o dudas despus del procedimiento. QU ESPERAR DESPUS DEL PROCEDIMIENTO Despus del procedimiento, es  comn:  Sentirse somnoliento durante varias horas.  Sentirse torpe y [employer] de equilibrio durante varias horas.  Perder el sentido de la realidad durante varias horas.  Vomitar si come [employer]. INSTRUCCIONES PARA EL CUIDADO EN EL HOGAR Durante al menos 24horas despus del procedimiento:  No haga lo siguiente: ? Participar en actividades que impliquen posibles cadas o lesiones. ? Conducir vehculos. ? Operar maquinarias pesadas. ? Beber alcohol. ? Tomar somnferos o medicamentos que causen somnolencia. ? Firmar documentos legales ni tomar [employer]. ? Cuidar a nios por su cuenta.  Hacer reposo. Comida y bebida  Siga la dieta recomendada por el mdico.  Si vomita: ? Pruebe agua, jugo o sopa cuando usted pueda beber sin vomitar. ? Asegrese de no tener nuseas antes de ingerir alimentos slidos. Instrucciones generales  Permanezca con un adulto responsable hasta que est completamente despierto y consciente.  Tome los medicamentos de venta libre y los recetados solamente como se lo haya indicado el mdico.  Si fuma, no lo haga sin supervisin.  Concurra a todas las visitas de control como se lo haya indicado el mdico. Esto es importante. SOLICITE ATENCIN MDICA SI:  Sigue teniendo nuseas o vomitando.  Tiene sensacin de desvanecimiento.  Le aparece una erupcin cutnea.  Tiene fiebre. SOLICITE ATENCIN MDICA DE INMEDIATO SI:  Tiene dificultad para respirar. Esta informacin no tiene Marine scientist el consejo del mdico. Asegrese de hacerle al mdico cualquier pregunta que tenga. Document Released: 07/26/2013 Document Revised: 08/11/2014 Document Reviewed: 11/10/2015 Elsevier Interactive Patient Education  [employer].

## 2018-03-02 NOTE — H&P (Signed)
History and Physical    Amy Morrow KGU:[id number] DOB: 1961-05-20 DOA: 03/02/2018  PCP: Patient, No Pcp Per   Patient coming from: Home    Chief Complaint: Fever, tachycardia, hypotension  HPI: Amy Morrow is a 57 y.o. 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  She was being monitored on short stay she became febrile, tachycardic and hypotensive.  Hospitalist service was requested for admission. She follows with Dr. Roanna Raider oncology.  She was diagnosed with cervical squamous cell carcinoma on 6/19.She underwent radical hysterectomy with pelvic lymphadenectomy With  Subsequent bilateral  ureterovaginal fistula.  She was recommended percutaneous nephrostomy tube placement to assist in decreasing urine flow through the vagina and she had this procedure done here today.  She also has bilateral ureteral stents put by urology earlier for bilateral ureterovaginal fistulas. Patient seen and examined the bedside at short stay.  Her blood pressure was on the lower side.  She was tachycardic.  Patient just speaks Spanish so interpreter was in the room.  She complains of chest pain on deep breathing.  She complains of lower abdominal discomfort. Blood cultures were sent.  Patient started on antibiotics.  Review of Systems: As per HPI otherwise 10 point review of systems negative.    Past Medical History:  Diagnosis Date  . Abnormal Pap smear   . Anemia   . Cervical cancer [facility]) oncologist- dr Denman George   Stage IB1  SCCa   . Hyperlipidemia   . Urgency of urination    intermittant    Past Surgical History:  Procedure Laterality Date  . CYSTOSCOPY W/ RETROGRADES Bilateral 02/16/2018   Procedure: CYSTOSCOPY WITH RETROGRADE PYELOGRAM BILATERAL DIAGNOSTIC  URETEROSCOPY, BILATERAL  STENT PLACEMENT;  Surgeon: Ardis Hughs, MD;  Location: [facility];  Service: Urology;  Laterality: Bilateral;  . D & C LEEP CONIZATION BX  07-31-2003   dr p. rose  [facility]  . IR NEPHROSTOMY PLACEMENT RIGHT  03/02/2018  . PELVIC LYMPH NODE DISSECTION Bilateral 02/08/2018   Procedure: BILATEAL PEVIC  LYMPHADENECTOMY;  Surgeon: [name], MD;  Location: [facility];  Service: Gynecology;  Laterality: Bilateral;  . ROBOTIC ASSISTED TOTAL HYSTERECTOMY WITH BILATERAL SALPINGO OOPHERECTOMY N/A 02/08/2018   Procedure: XI ROBOTIC ASSISTED TOTAL Radical HYSTERECTOMY WITH BILATERAL SALPINGO OOPHORECTOMY;  Surgeon: [name], MD;  Location: [facility];  Service: Gynecology;  Laterality: N/A;  . Transvaginal tape placement  03-12-2009  dr Emeterio Reeve  [facility]   GYNECARE TENSION-FREE VAGINAL TAPE SLING  . TUBAL LIGATION  05-28-2005  DR  MARSHALL  @ [facility]   PPTL     reports that she has never smoked. She has never used smokeless tobacco. She reports that she does not drink alcohol or use drugs.  No Known Allergies  Family History  Problem Relation Age of Onset  . Hypertension Mother   . Heart disease Mother   . Hypertension Brother   . Heart disease Brother      Prior to Admission medications   Medication Sig Start Date End Date Taking? Authorizing Provider  acetaminophen (TYLENOL) 500 MG tablet Take 500 mg by mouth every 6 (six) hours as needed for fever.   Yes [provider]  docusate sodium (COLACE) 100 MG capsule Take 1 capsule (100 mg total) by mouth 2 (two) times daily. 02/09/18  Yes Isabel Caprice, MD  metFORMIN (GLUCOPHAGE XR) 500 MG 24 hr tablet Take 1 tablet (500 mg total) by mouth daily with breakfast. 02/23/18  Yes [name], [name]  polyethylene glycol [facility] / Floria Raveling) packet Take 17 g by mouth daily. 02/18/18  Yes [name], MD  senna (SENOKOT) 8.6 MG TABS tablet Take 1 tablet (8.6 mg total) by mouth at bedtime. 02/18/18  Yes [name], MD    amoxicillin-clavulanate (AUGMENTIN) 875-125 MG tablet Take 1 tablet by mouth 2 (two) times daily. 02/18/18   [name], MD    Physical Exam: Vitals:   03/02/18 1549 03/02/18 1620 03/02/18 1631 03/02/18 1644  BP: (!) 97/54 (!) 109/51 (!) 104/49 (!) 96/47  Pulse: (!) 124 (!) 126 (!) 125 (!) 119  Resp: 16 16 16    Temp: 98.4 F (36.9 C) (!) 100.5 F (38.1 C) (!) 102.4 F (39.1 C) (!) 101.1 F (38.4 C)  TempSrc: Oral Oral Oral Oral  SpO2: 99% 93% 94% 97%    Constitutional: weak, ill looking Vitals:   03/02/18 1549 03/02/18 1620 03/02/18 1631 03/02/18 1644  BP: (!) 97/54 (!) 109/51 (!) 104/49 (!) 96/47  Pulse: (!) 124 (!) 126 (!) 125 (!) 119  Resp: 16 16 16    Temp: 98.4 F (36.9 C) (!) 100.5 F (38.1 C) (!) 102.4 F (39.1 C) (!) 101.1 F (38.4 C)  TempSrc: Oral Oral Oral Oral  SpO2: 99% 93% 94% 97%   Eyes: PERRL, lids and conjunctivae normal ENMT: Mucous membranes are moist. Posterior pharynx clear of any exudate or lesions.Normal dentition.  Neck: normal, supple, no masses, no thyromegaly Respiratory: clear to auscultation bilaterally, no wheezing, no crackles. Normal respiratory effort. No accessory muscle use.  Cardiovascular: sinus tachycardia, no murmurs / rubs / gallops. No extremity edema. 2+ pedal pulses. No carotid bruits.  Abdomen: no tenderness, no masses palpated. No hepatosplenomegaly. Bowel sounds positive.  Right-sided nephrostomy tube with sanguinous urine in the bag Musculoskeletal: no clubbing / cyanosis. No joint deformity upper and lower extremities.  Skin: no rashes, lesions, ulcers. No induration Neurologic: CN 2-12 grossly intact. Sensation intact, DTR normal. Strength 5/5 in all 4.  Psychiatric: Normal judgment and insight. Alert and oriented x 3. Normal mood.   Foley Catheter:None  Labs on Admission: I have personally reviewed following labs and imaging studies  CBC: Recent Labs  Lab 03/02/18 0952  WBC 6.3  NEUTROABS 3.9  HGB 10.4*  HCT  31.3*  MCV 91.3  PLT 106*   Basic Metabolic Panel: Recent Labs  Lab 03/02/18 0952  NA 141  K 3.6  CL 105  CO2 28  GLUCOSE 103*  BUN 14  CREATININE 0.67  CALCIUM 9.2   GFR: Estimated Creatinine Clearance: 65.5 mL/min (by C-G formula based on SCr of 0.67 mg/dL). Liver Function Tests: No results for input(s): AST, ALT, ALKPHOS, BILITOT, PROT, ALBUMIN in the last 168 hours. No results for input(s): LIPASE, AMYLASE in the last 168 hours. No results for input(s): AMMONIA in the last 168 hours. Coagulation Profile: Recent Labs  Lab 03/02/18 0952  INR 0.90   Cardiac Enzymes: No results for input(s): CKTOTAL, CKMB, CKMBINDEX, TROPONINI in the last 168 hours. BNP (last 3 results) No results for input(s): PROBNP in the last 8760 hours. HbA1C: No results for input(s): HGBA1C in the last 72 hours. CBG: Recent Labs  Lab 03/02/18 1601  GLUCAP 93   Lipid Profile: No results for input(s): CHOL, HDL, LDLCALC, TRIG, CHOLHDL, LDLDIRECT in the last 72 hours. Thyroid Function Tests: No results for  input(s): TSH, T4TOTAL, FREET4, T3FREE, THYROIDAB in the last 72 hours. Anemia Panel: No results for input(s): VITAMINB12, FOLATE, FERRITIN, TIBC, IRON, RETICCTPCT in the last 72 hours. Urine analysis:    Component Value Date/Time   COLORURINE YELLOW 02/11/2018 1808   APPEARANCEUR CLEAR 02/11/2018 1808   LABSPEC 1.006 02/11/2018 1808   PHURINE 7.0 02/11/2018 1808   GLUCOSEU 50 (A) 02/11/2018 1808   HGBUR LARGE (A) 02/11/2018 1808   BILIRUBINUR NEGATIVE 02/11/2018 1808   BILIRUBINUR NEG 01/25/2013 0947   KETONESUR NEGATIVE 02/11/2018 1808   PROTEINUR NEGATIVE 02/11/2018 1808   UROBILINOGEN 0.2 01/25/2013 0947   UROBILINOGEN 0.2 03/29/2009 1409   NITRITE NEGATIVE 02/11/2018 1808   LEUKOCYTESUR NEGATIVE 02/11/2018 1808    Radiological Exams on Admission: Ir Nephrostomy Placement Right  Result Date: 03/02/2018 CLINICAL DATA:  Cervical carcinoma with postoperative uretero-vaginal  fistulas and visualization of right ureteral stent in the vagina. Right percutaneous nephrostomy tube requested for urinary diversion. There is extravasation at the time of retrograde studies at the level of the distal left ureter as well. Bilateral ureteral stents are currently in place. EXAM: 1. ULTRASOUND GUIDANCE FOR PUNCTURE OF THE RIGHT RENAL COLLECTING SYSTEM. 2. RIGHT PERCUTANEOUS NEPHROSTOMY TUBE PLACEMENT. COMPARISON:  CT of the abdomen and pelvis on 02/16/2018 ANESTHESIA/SEDATION: 3.0 mg IV Versed; 100 mcg IV Fentanyl. Total Moderate Sedation Time 23 minutes. The patient's level of consciousness and physiologic status were continuously monitored during the procedure by Radiology nursing. CONTRAST:  20 mL Isovue-300 MEDICATIONS: 2 g IV Ancef. Antibiotic was administered in an appropriate time frame prior to skin puncture. FLUOROSCOPY TIME:  1 minutes and 18 seconds.  16.6 mGy. PROCEDURE: The procedure, risks, benefits, and alternatives were explained to the patient. Questions regarding the procedure were encouraged and answered. The patient understands and consents to the procedure. A time-out was performed prior to initiating the procedure. Both kidneys were inspected by ultrasound. The right flank region was prepped with chlorhexidine in a sterile fashion, and a sterile drape was applied covering the operative field. A sterile gown and sterile gloves were used for the procedure. Local anesthesia was provided with 1% Lidocaine. Ultrasound was used to localize the right kidney. Under direct ultrasound guidance, a 21 gauge needle was advanced into the renal collecting system. Ultrasound image documentation was performed. Aspiration of urine sample was performed followed by contrast injection. A transitional dilator was advanced over a guidewire. Percutaneous tract dilatation was then performed over the guidewire. A 10-French percutaneous nephrostomy tube was then advanced and formed in the collecting  system. Catheter position was confirmed by fluoroscopy after contrast injection. The catheter was attached to a gravity bag and secured at the skin with a Prolene retention suture and Stat-Lock device. COMPLICATIONS: None FINDINGS: Ultrasound demonstrates mild to moderate right-sided hydronephrosis. There is no significant left hydronephrosis. A 10 French nephrostomy tube was placed on the right and formed at the level of the renal pelvis. IMPRESSION: Right-sided percutaneous nephrostomy tube placement. A 10 French catheter was placed and formed in the renal pelvis. This will be left to gravity bag drainage. Ultrasound demonstrates no evidence of left-sided hydronephrosis currently. Attempt at placing a left percutaneous nephrostomy tube was deferred today. Left nephrostomy tube placement would be very difficult without some degree of hydronephrosis. Electronically Signed   By: Aletta Edouard M.D.   On: 03/02/2018 15:36     Assessment/Plan Principal Problem:   Sepsis ([place]) Active Problems:   Cervical cancer ([place])  Sepsis: Highly suspicion for sepsis .Status post nephrostomy  tube placement.  Patient meets SIRS criteria.  Tachycardic, febrile and hypotensive.  Given her a bolus of 1 L of normal saline.  We will continue IV fluids.  Blood cultures have been sent.  Will check lactic acid, procalcitonin.  Started on broad-spectrum antibiotics with vancomycin and Zosyn.We will check CXR.  She will be admitted to stepdown.  Cervical cancer: She follows with Dr. Roanna Raider oncology.  She was diagnosed with cervical squamous cell carcinoma on 6/19.She underwent radical hysterectomy with pelvic lymphadenectomy. There  was no clinical involvement of the parametrium and no suspicious nodes.She subsequent developed bilateral  ureterovaginal fistula.  She was recommended right percutaneous nephrostomy tube placement to assist in decreasing urine flow through the vagina and she had this procedure done here today.  She  also has bilateral ureteral stents put by urology earlier for bilateral ureterovaginal fistulas. She is being worked up as an outpatient for incidentally found a thyroid nodule on PET scan .   Severity of Illness: The appropriate patient status for this patient is INPATIENT  DVT prophylaxis: SCD Code Status: Full Family Communication: Family member present at the bedside Consults called: None     Shelly Coss MD [facility] Pager [phone]  If 7PM-7AM, please contact night-coverage [email or web address] Password [facility]  03/02/2018, 5:17 PM

## 2018-03-02 NOTE — Progress Notes (Signed)
Pt transferred to room 1227-01; report given to [facility].

## 2018-03-02 NOTE — Progress Notes (Signed)
Patient ID: Amy Morrow, female   DOB: [birth year], 57 y.o.   MRN: [id number] Per order of Dr. Kathlene Cote patient given prescription for Vicodin 5/325, #15, no refill, 1 to 2 tablets every 4-6 hours as needed for moderate to severe pain.  She is scheduled to follow-up with Dr. Denman George on 8/2.  Drain site care reviewed with patient/husband.

## 2018-03-03 ENCOUNTER — Inpatient Hospital Stay (HOSPITAL_COMMUNITY): Payer: Medicaid Other

## 2018-03-03 ENCOUNTER — Encounter (HOSPITAL_COMMUNITY): Payer: Self-pay | Admitting: Radiology

## 2018-03-03 DIAGNOSIS — A419 Sepsis, unspecified organism: Secondary | ICD-10-CM

## 2018-03-03 DIAGNOSIS — D649 Anemia, unspecified: Secondary | ICD-10-CM

## 2018-03-03 LAB — CBC WITH DIFFERENTIAL/PLATELET
Band Neutrophils: 17 %
Basophils Absolute: 0 10*3/uL (ref 0.0–0.1)
Basophils Absolute: 0 10*3/uL (ref 0.0–0.1)
Basophils Relative: 0 %
Basophils Relative: 0 %
Blasts: 0 %
EOS ABS: 0.1 10*3/uL (ref 0.0–0.7)
EOS PCT: 0 %
Eosinophils Absolute: 0 10*3/uL (ref 0.0–0.7)
Eosinophils Relative: 1 %
HEMATOCRIT: 22.9 % — AB (ref 36.0–46.0)
HEMATOCRIT: 24.9 % — AB (ref 36.0–46.0)
HEMOGLOBIN: 8.1 g/dL — AB (ref 12.0–15.0)
Hemoglobin: 7.3 g/dL — ABNORMAL LOW (ref 12.0–15.0)
LYMPHS ABS: 0.3 10*3/uL — AB (ref 0.7–4.0)
LYMPHS ABS: 0.6 10*3/uL — AB (ref 0.7–4.0)
LYMPHS PCT: 4 %
Lymphocytes Relative: 2 %
MCH: 29.6 pg (ref 26.0–34.0)
MCH: 30.1 pg (ref 26.0–34.0)
MCHC: 31.9 g/dL (ref 30.0–36.0)
MCHC: 32.5 g/dL (ref 30.0–36.0)
MCV: 92.7 fL (ref 78.0–100.0)
MCV: 92.6 fL (ref 78.0–100.0)
MONOS PCT: 1 %
MONOS PCT: 4 %
Metamyelocytes Relative: 0 %
Monocytes Absolute: 0.2 10*3/uL (ref 0.1–1.0)
Monocytes Absolute: 0.6 10*3/uL (ref 0.1–1.0)
Myelocytes: 0 %
NEUTROS ABS: 14.8 10*3/uL — AB (ref 1.7–7.7)
NEUTROS ABS: 12.8 10*3/uL — AB (ref 1.7–7.7)
NEUTROS PCT: 91 %
NRBC: 0 /100{WBCs}
Neutrophils Relative %: 80 %
Other: 0 %
PLATELETS: 267 10*3/uL (ref 150–400)
Platelets: 285 10*3/uL (ref 150–400)
Promyelocytes Relative: 0 %
RBC: 2.47 MIL/uL — AB (ref 3.87–5.11)
RBC: 2.69 MIL/uL — AB (ref 3.87–5.11)
RDW: 14.2 % (ref 11.5–15.5)
RDW: 14.7 % (ref 11.5–15.5)
WBC: 15.3 10*3/uL — AB (ref 4.0–10.5)
WBC: 14.2 10*3/uL — AB (ref 4.0–10.5)

## 2018-03-03 LAB — GLUCOSE, CAPILLARY
GLUCOSE-CAPILLARY: 124 mg/dL — AB (ref 70–99)
GLUCOSE-CAPILLARY: 108 mg/dL — AB (ref 70–99)
Glucose-Capillary: 112 mg/dL — ABNORMAL HIGH (ref 70–99)
Glucose-Capillary: 103 mg/dL — ABNORMAL HIGH (ref 70–99)

## 2018-03-03 LAB — PREPARE RBC (CROSSMATCH)

## 2018-03-03 LAB — CBC
HEMATOCRIT: 23.1 % — AB (ref 36.0–46.0)
HEMOGLOBIN: 7.5 g/dL — AB (ref 12.0–15.0)
MCH: 29.9 pg (ref 26.0–34.0)
MCHC: 32.5 g/dL (ref 30.0–36.0)
MCV: 92 fL (ref 78.0–100.0)
Platelets: 257 10*3/uL (ref 150–400)
RBC: 2.51 MIL/uL — AB (ref 3.87–5.11)
RDW: 14.6 % (ref 11.5–15.5)
WBC: 14.6 10*3/uL — AB (ref 4.0–10.5)

## 2018-03-03 LAB — BASIC METABOLIC PANEL
Anion gap: 4 — ABNORMAL LOW (ref 5–15)
BUN: 11 mg/dL (ref 6–20)
CO2: 24 mmol/L (ref 22–32)
Calcium: 7.3 mg/dL — ABNORMAL LOW (ref 8.9–10.3)
Chloride: 110 mmol/L (ref 98–111)
Creatinine, Ser: 0.85 mg/dL (ref 0.44–1.00)
GFR calc non Af Amer: >60 mL/min (ref >60)
Glucose, Bld: 146 mg/dL — ABNORMAL HIGH (ref 70–99)
POTASSIUM: 3.3 mmol/L — AB (ref 3.5–5.1)
SODIUM: 138 mmol/L (ref 135–145)

## 2018-03-03 LAB — MAGNESIUM: MAGNESIUM: 1.7 mg/dL (ref 1.7–2.4)

## 2018-03-03 LAB — LACTIC ACID, PLASMA: LACTIC ACID, VENOUS: 2.8 mmol/L — AB (ref 0.5–1.9)

## 2018-03-03 MED ORDER — SODIUM CHLORIDE 0.9 % IV BOLUS
1000.0000 mL | Freq: Once | INTRAVENOUS | Status: AC
  Administered 2018-03-03: 1000 mL via INTRAVENOUS

## 2018-03-03 MED ORDER — ACETAMINOPHEN 325 MG PO TABS
650.0000 mg | ORAL_TABLET | Freq: Four times a day (QID) | ORAL | Status: DC | PRN
  Administered 2018-03-03: 650 mg via ORAL
  Filled 2018-03-03: qty 2

## 2018-03-03 MED ORDER — ONDANSETRON HCL 4 MG/2ML IJ SOLN
4.0000 mg | Freq: Four times a day (QID) | INTRAMUSCULAR | Status: DC | PRN
  Administered 2018-03-03 – 2018-03-06 (×3): 4 mg via INTRAVENOUS
  Filled 2018-03-03 (×4): qty 2

## 2018-03-03 MED ORDER — IOPAMIDOL (ISOVUE-300) INJECTION 61%
INTRAVENOUS | Status: AC
  Filled 2018-03-03: qty 100

## 2018-03-03 MED ORDER — MAGNESIUM SULFATE 2 GM/50ML IV SOLN
2.0000 g | Freq: Once | INTRAVENOUS | Status: AC
  Administered 2018-03-03: 2 g via INTRAVENOUS
  Filled 2018-03-03: qty 50

## 2018-03-03 MED ORDER — POLYETHYLENE GLYCOL 3350 17 G PO PACK
17.0000 g | PACK | Freq: Every day | ORAL | Status: DC
  Administered 2018-03-03 – 2018-03-05 (×3): 17 g via ORAL
  Filled 2018-03-03 (×3): qty 1

## 2018-03-03 MED ORDER — POTASSIUM CHLORIDE CRYS ER 20 MEQ PO TBCR
40.0000 meq | EXTENDED_RELEASE_TABLET | Freq: Once | ORAL | Status: AC
  Administered 2018-03-03: 40 meq via ORAL
  Filled 2018-03-03: qty 2

## 2018-03-03 MED ORDER — IOPAMIDOL (ISOVUE-300) INJECTION 61%
100.0000 mL | Freq: Once | INTRAVENOUS | Status: AC | PRN
  Administered 2018-03-03: 100 mL via INTRAVENOUS

## 2018-03-03 MED ORDER — SODIUM CHLORIDE 0.9 % IV BOLUS: 500.0000 mL | Freq: Once | INTRAVENOUS | Status: DC | PRN

## 2018-03-03 MED ORDER — SODIUM CHLORIDE 0.9% IV SOLUTION: Freq: Once | INTRAVENOUS | Status: DC

## 2018-03-03 MED ORDER — OXYCODONE HCL 5 MG PO TABS
5.0000 mg | ORAL_TABLET | Freq: Four times a day (QID) | ORAL | Status: DC | PRN
  Administered 2018-03-03 – 2018-03-06 (×7): 5 mg via ORAL
  Filled 2018-03-03 (×7): qty 1

## 2018-03-03 MED ORDER — PHENYLEPHRINE HCL-NACL 10-0.9 MG/250ML-% IV SOLN
0.0000 ug/min | INTRAVENOUS | Status: DC
  Filled 2018-03-03: qty 250

## 2018-03-03 MED ORDER — MORPHINE SULFATE (PF) 2 MG/ML IV SOLN
2.0000 mg | INTRAVENOUS | Status: DC | PRN
  Administered 2018-03-04: 2 mg via INTRAVENOUS
  Filled 2018-03-03: qty 1

## 2018-03-03 NOTE — Progress Notes (Signed)
Referring Physician(s): [name],[name]  Supervising Physician: Jacqulynn Cadet  Patient Status:  [facility] - In-pt  Chief Complaint:   right flank pain  Subjective: Patient feeling a little better this morning.  Denies nausea/ vomiting.  Continues to have some right flank pain but not as severe as yesterday.  Urine tea colored.  She does have some vaginal drainage.   Allergies: Patient has no known allergies.  Medications: Prior to Admission medications   Medication Sig Start Date End Date Taking? Authorizing Provider  acetaminophen (TYLENOL) 500 MG tablet Take 500 mg by mouth every 6 (six) hours as needed for fever.   Yes [provider]  docusate sodium (COLACE) 100 MG capsule Take 1 capsule (100 mg total) by mouth 2 (two) times daily. 02/09/18  Yes [name], MD  metFORMIN (GLUCOPHAGE XR) 500 MG 24 hr tablet Take 1 tablet (500 mg total) by mouth daily with breakfast. 02/23/18  Yes [name], [name]  polyethylene glycol [facility] / Floria Raveling) packet Take 17 g by mouth daily. 02/18/18  Yes [name], MD  senna (SENOKOT) 8.6 MG TABS tablet Take 1 tablet (8.6 mg total) by mouth at bedtime. 02/18/18  Yes [name], MD  amoxicillin-clavulanate (AUGMENTIN) 875-125 MG tablet Take 1 tablet by mouth 2 (two) times daily. Patient not taking: Reported on 03/02/2018 02/18/18   [name], MD     Vital Signs: BP (!) 82/52 Comment: MD notified  Temp 98.6 F (37 C) (Oral)   Resp 17   LMP 11/16/2012   SpO2 100%   Physical Exam awake, alert.  Right PCN intact, dressing dry, site mod tender to palpation; output 750 cc tea colored urine  Imaging: Dg Chest 1 View  Result Date: 03/02/2018 CLINICAL DATA:  Chest pain EXAM: CHEST  1 VIEW COMPARISON:  02/11/2018 FINDINGS: Low lung volumes. Patchy atelectasis at the left base. No pleural effusion. Stable cardiomediastinal silhouette with minimal central congestion. No pneumothorax. IMPRESSION: Low lung volumes with minimal central  congestion and patchy atelectasis at the left base Electronically Signed   By: [name]   On: 03/02/2018 19:59   Dg Abd 1 View  Result Date: 03/03/2018 CLINICAL DATA:  Right-sided abdominal pain. Right-sided nephrostomy tube placement yesterday. EXAM: ABDOMEN - 1 VIEW COMPARISON:  CT 02/16/2018. FINDINGS: Bilateral nephroureteral stents in place. Additionally right-sided nephrostomy tube in place. Normal bowel gas pattern. No bowel dilatation to suggest obstruction. Moderate stool in the ascending, distal transverse and descending colon. No evidence of free air on supine views. IMPRESSION: Bilateral ureteral stents in place. Right nephrostomy tube in place. Normal bowel gas pattern. Electronically Signed   By: [name]   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 Right percutaneous nephrostomy tube requested for urinary diversion. There is extravasation at the time of retrograde studies at the level of the distal left ureter as well. Bilateral ureteral stents are currently in place. EXAM: 1. ULTRASOUND GUIDANCE FOR PUNCTURE OF THE RIGHT RENAL COLLECTING SYSTEM. 2. RIGHT PERCUTANEOUS NEPHROSTOMY TUBE PLACEMENT. COMPARISON:  CT of the abdomen and pelvis on 02/16/2018 ANESTHESIA/SEDATION: 3.0 mg IV Versed; 100 mcg IV Fentanyl. Total Moderate Sedation Time 23 minutes. The patient's level of consciousness and physiologic status were continuously monitored during the procedure by Radiology nursing. CONTRAST:  20 mL Isovue-300 MEDICATIONS: 2 g IV Ancef. Antibiotic was administered in an  appropriate time frame prior to skin puncture. FLUOROSCOPY TIME:  1 minutes and 18 seconds.  16.6 mGy. PROCEDURE: The procedure, risks, benefits, and alternatives were explained to the patient. Questions regarding the procedure were  encouraged and answered. The patient understands and consents to the procedure. A time-out was performed prior to initiating the procedure. Both kidneys were inspected by ultrasound. The right flank region was prepped with chlorhexidine in a sterile fashion, and a sterile drape was applied covering the operative field. A sterile gown and sterile gloves were used for the procedure. Local anesthesia was provided with 1% Lidocaine. Ultrasound was used to localize the right kidney. Under direct ultrasound guidance, a 21 gauge needle was advanced into the renal collecting system. Ultrasound image documentation was performed. Aspiration of urine sample was performed followed by contrast injection. A transitional dilator was advanced over a guidewire. Percutaneous tract dilatation was then performed over the guidewire. A 10-French percutaneous nephrostomy tube was then advanced and formed in the collecting system. Catheter position was confirmed by fluoroscopy after contrast injection. The catheter was attached to a gravity bag and secured at the skin with a Prolene retention suture and Stat-Lock device. COMPLICATIONS: None FINDINGS: Ultrasound demonstrates mild to moderate right-sided hydronephrosis. There is no significant left hydronephrosis. A 10 French nephrostomy tube was placed on the right and formed at the level of the renal pelvis. IMPRESSION: Right-sided percutaneous nephrostomy tube placement. A 10 French catheter was placed and formed in the renal pelvis. This will be left to gravity bag drainage. Ultrasound demonstrates no evidence of left-sided hydronephrosis currently. Attempt at placing a left percutaneous nephrostomy tube was deferred today. Left nephrostomy tube placement would be very difficult without some degree of hydronephrosis. Electronically Signed   By: Aletta Edouard M.D.   On: 03/02/2018 15:36    Labs:  CBC: Recent Labs    02/23/18 1534 03/02/18 0952 03/02/18 1641 03/03/18 0315    WBC 7.8 6.3 5.0 15.3*  HGB 10.9* 10.4* 9.2* 7.3*  HCT 34.1 31.3* 28.4* 22.9*  PLT 771* 458* 324 267    COAGS: Recent Labs    03/02/18 0952  INR 0.90    BMP: Recent Labs    02/18/18 0435 03/02/18 0952 03/02/18 1641 03/03/18 0315  NA 137 141 139 138  K 4.0 3.6 3.0* 3.3*  CL 98 105 106 110  CO2 30 28 26 24   GLUCOSE 123* 103* 115* 146*  BUN 14 14 13 11   CALCIUM 8.7* 9.2 8.2* 7.3*  CREATININE 0.71 0.67 0.53 0.85  GFRNONAA >60 >60 >60 >60  GFRAA >60 >60 >60 >60    LIVER FUNCTION TESTS: Recent Labs    02/11/18 1818 02/16/18 1212 02/17/18 0352 03/02/18 1641  BILITOT 1.0 0.5 0.4 0.9  AST 28 79* 54* 40  ALT 26 197* 150* 27  ALKPHOS 65 306* 244* 178*  PROT 6.7 7.7 7.3 6.2*  ALBUMIN 3.1* 3.1* 3.2* 2.9*    Assessment and Plan: 57 y.o. female with history of squamous cell carcinoma of the cervix, status post robotic assisted radical hysterectomy with upper vaginectomy and pelvic lymphadenectomy on 02/08/2018.  Patient subsequently developed bilateral ureterovaginal fistulas, right greater than left.  She has bilateral ureteral stents in place.  The right ureteral stent is visible in the vagina and patient now has leakage of urine from the vagina as well as some occasional vaginal bleeding.  She is status post diverting right percutaneous nephrostomy on 03/02/2018 with subsequent rigors, nausea, vomiting, fever, tachycardia and hypotension.  She was  treated with fluid boluses, antiemetics, Ancef preprocedure and currently on Zosyn and vancomycin.  Of note patient had prior enterococcal UTI earlier this month and completed outpatient course of  Augmentin.  She is currently afebrile, but with soft BP.  Heart rate in the 80's.  WBC 15.3 (5.0), hemoglobin 7.3 down from 9.2, creatinine normal, potassium 3.3.  Urine/blood cultures pending.  Chest x-ray and plain abdominal film with no acute findings.  Urine tea colored in appearance.  She is moderately tender in the right flank region.  In  light of drop in hemoglobin and persistent flank discomfort will check CT abdomen pelvis with and without IV contrast to rule out bleed.  Above discussed with Dr. Laurence Ferrari and CCM.  Appreciate TRH assistance with admission.  Patient updated with plans.  Patient will most likely require placement of diverting left PCN at some point following resolution of infection (she had no sig left hydro on exam yesterday).  Placement would be very difficult without some degree of hydronephrosis however.     Electronically Signed: D. Rowe Robert, [name] 03/03/2018, 9:15 AM   I spent a total of 20 minutes at the the patient's bedside AND on the patient's hospital floor or unit, greater than 50% of which was counseling/coordinating care for right nephrostomy    Patient ID: Amy Morrow, female   DOB: 1961/01/10, 57 y.o.   MRN: [id number]

## 2018-03-03 NOTE — Progress Notes (Signed)
Gynecologic Oncology  Amy Morrow is currently admitted for concerns of sepsis s/p radical hysterectomy for cervical cancer on 02/08/18.  Post-operatively she developed a uretero-vaginal fistula (right) at 1 week. Treatment at that time included retrograde ureteral stents.  At follow up appointment, she continued to report profuse leakage vaginally therefore percutaneous nephrostomy tubes were recommended.  She had placement of a right PCN tube on 03/02/18 and developed tachycardia, chills, fever after the procedure.    Subjective: Patient reports moderate pain around the nephrostomy tube and states when she takes a deep breath she experiences significant discomfort at the site.  Intermittent nausea.  Getting a bath at this time.  States she feels the vaginal drainage has decreased slightly.    Objective: Vital signs in last 24 hours: Temp:  [98.3 F (36.8 C)-102.4 F (39.1 C)] 98.4 F (36.9 C) (07/31 0800) Pulse Rate:  [70-126] 112 (07/30 1721) Resp:  [13-22] 13 (07/31 0900) BP: (70-115)/(42-101) 95/49 (07/31 0900) SpO2:  [93 %-100 %] 98 % (07/31 0900) Weight:  [145 lb 15.1 oz (66.2 kg)] 145 lb 15.1 oz (66.2 kg) (07/30 1822)    Intake/Output from previous day: 07/30 0701 - 07/31 0700 In: 1259.2 [I.V.:1259.2] Out: 750 [Urine:750]  Physical Examination: General: alert, cooperative and grimacing with movement Resp: clear to auscultation bilaterally Cardio: tachycardic, no murmurs/clicks/rubs GI: soft, non-tender; bowel sounds normal; no masses,  no organomegaly and incision: lap sites to the abdomen healing without erythema or drainage Extremities: extremities normal, atraumatic, no cyanosis or edema  Dressing dry and intact around PCN on the right with tea colored urine   Labs: WBC/Hgb/Hct/Plts:  14.6/7.5/23.1/257 (07/31 1033) BUN/Cr/glu/ALT/AST/amyl/lip:  11/0.85/--/--/--/--/-- (07/31 0315)  Chest 1 view on 03/02/18. Abd 1 view today along with CT AP resulting: 1. Interval  improvement in complex pelvic fluid collection and surrounding inflammatory changes status post bilateral nephroureteral catheter and right percutaneous nephrostomy placement. 2. No evidence of perinephric hematoma or other signs of acute bleeding. 3. Mild bilateral hydroureter, improved from preoperative CT. There is bilateral excretion of contrast into the renal collecting systems, but no significant opacification of the ureters or bladder. 4. New dependent opacities at both lung bases, likely atelectasis.   Assessment: Pain:  Pain somewhat controlled on PRN medications.  Heme: Hgb 7.5 and Hct 23.1 this am. Repeat labs ordered for am.  ID: Urosepsis. On Vancomycin and Zosyn IV. WBC 14.6 this am.  CV: Tachycardia and hypotension upon admission.  Hypotension resolved after IVF.  GI:  Tolerating po: Yes.  Antiemetics ordered PRN.  GU: 750 cc urine output from PCN.      FEN: K+3.3, Ca+ 7.3.  Replacement per Hospitalist Team.  Endo: CBG:  CBG (last 3)  Recent Labs    03/03/18 0825 03/03/18 1304 03/03/18 1712  GLUCAP 124* 108* 112*    Plan: Dr. Denman George to see patient later today Continue plan of care per Hospitalist and Critical Care Team   LOS: 1 day    Amy Morrow 03/03/2018, 1:22 PM

## 2018-03-03 NOTE — Consult Note (Addendum)
PULMONARY / CRITICAL CARE MEDICINE   Name: Amy Morrow MRN: [id number] DOB: [birth year]    ADMISSION DATE:  03/02/2018 CONSULTATION DATE: 03/03/2018  REFERRING MD: Dr. Elbert Ewings / [facility]   CHIEF COMPLAINT: Headache, fever  HISTORY OF PRESENT ILLNESS: 57 year old Spanish-speaking female who presented to [place] on 7/30 for right-sided nephrostomy tube placement by interventional radiology.    She carries a history of newly diagnosed (01/20/2018) squamous cell carcinoma of the cervix as/P radical hysterectomy (02/08/2018) with pelvic lymphadenectomy with subsequent bilateral ureterovaginal fistula, diabetes type 2.  She is followed by Dr. Denman George of GYN oncology.  She returned to the ER on 7/11 with reports of nausea.  Urinalysis at that time showed blood but no evidence for UTI.  Urine culture was obtained and resulted on 7/14 which grew 70,000 colonies of pansensitive Enterococcus faecalis..  She was followed up in regards to symptoms and felt better.  She had an office visit on 7/16 with GYN oncology with reports of leaking of urine from vagina.  She was directly admitted to the hospital for further evaluation.  On 7/16 she was taken to the OR per [facility] for cystoscopy and retrograde pyelography with bilateral double-J stent placement.  She was found to have bilateral ureteral defects in the distal 2 cm of the ureters that were resulting in ureteral vaginal fistula.  At that time she was treated with IV Zosyn for 2 days and transition to p.o. Augmentin which she completed.  Postprocedure 7/30 she became febrile to 102.4, tachycardic, vomiting and hypotensive.  Hospitalist service was consulted for admission.  The patient was admitted to stepdown and treated with empiric antibiotics & IV fluids.  She was pancultured.  Chest x-ray at that time demonstrated low lung volumes with mild central congestion and patchy basilar atelectasis.  On 7/31 she developed worsening hypotension with pressures  into the 70s.  Additionally, her hemoglobin was noted to drop from 9-7.3.  PCCM consulted for evaluation of sepsis.   PAST MEDICAL HISTORY :  She  has a past medical history of Abnormal Pap smear, Anemia, Cervical cancer ([place]) (oncologist- dr Denman George), Hyperlipidemia, and Urgency of urination.  PAST SURGICAL HISTORY: She  has a past surgical history that includes Transvaginal tape placement (03-12-2009  dr Emeterio Reeve  [facility]); Tubal ligation (05-28-2005  DR  MARSHALL  @ [facility]); D & C LEEP CONIZATION BX (07-31-2003   dr p. rose  [facility]); Robotic assisted total hysterectomy with bilateral salpingo oophorectomy (N/A, 02/08/2018); Pelvic lymph node dissection (Bilateral, 02/08/2018); Cystoscopy w/ retrogrades (Bilateral, 02/16/2018); and IR NEPHROSTOMY PLACEMENT RIGHT (03/02/2018).  No Known Allergies  No current facility-administered medications on file prior to encounter.    Current Outpatient Medications on File Prior to Encounter  Medication Sig  . acetaminophen (TYLENOL) 500 MG tablet Take 500 mg by mouth every 6 (six) hours as needed for fever.  . docusate sodium (COLACE) 100 MG capsule Take 1 capsule (100 mg total) by mouth 2 (two) times daily.  . metFORMIN (GLUCOPHAGE XR) 500 MG 24 hr tablet Take 1 tablet (500 mg total) by mouth daily with breakfast.  . polyethylene glycol (MIRALAX / GLYCOLAX) packet Take 17 g by mouth daily.  Marland Kitchen senna (SENOKOT) 8.6 MG TABS tablet Take 1 tablet (8.6 mg total) by mouth at bedtime.  Marland Kitchen amoxicillin-clavulanate (AUGMENTIN) 875-125 MG tablet Take 1 tablet by mouth 2 (two) times daily. (Patient not taking: Reported on 03/02/2018)    FAMILY HISTORY:  Her family history includes Heart disease in her  brother and mother; Hypertension in her brother and mother.  SOCIAL HISTORY: She  reports that she has never smoked. She has never used smokeless tobacco. She reports that she does not drink alcohol or use drugs.  REVIEW OF SYSTEMS: Positives in bold Gen: Denies fever, chills,  weight change, fatigue, night sweats HEENT: Denies blurred vision, double vision, hearing loss, tinnitus, sinus congestion, rhinorrhea, sore throat, neck stiffness, dysphagia PULM: Denies shortness of breath, cough, sputum production, hemoptysis, wheezing CV: Denies chest pain, edema, orthopnea, paroxysmal nocturnal dyspnea, palpitations GI: Denies abdominal pain, nausea, vomiting, diarrhea, hematochezia, melena, constipation, change in bowel habits.  Flank pain on the right. GU: Denies dysuria, hematuria, polyuria, oliguria, urethral discharge Endocrine: Denies hot or cold intolerance, polyuria, polyphagia or appetite change Derm: Denies rash, dry skin, scaling or peeling skin change Heme: Denies easy bruising, bleeding, bleeding gums Neuro: Denies headache, numbness, weakness, slurred speech, loss of memory or consciousness   SUBJECTIVE: RN reports hypotension with systolic pressure as low as 70.  VITAL SIGNS: BP (!) 82/52 Comment: MD notified  Temp 98.6 F (37 C) (Oral)   Resp 17   LMP 11/16/2012   SpO2 100%   HEMODYNAMICS:    VENTILATOR SETTINGS:    INTAKE / OUTPUT: I/O last 3 completed shifts: In: 1259.2 [I.V.:1259.2] Out: 750 [Urine:750]  PHYSICAL EXAMINATION: General: Adult female in no acute distress.  Appears uncomfortable lying in bed. Neuro: AAO x4, interpreter utilized for interview, MAE Psych: Tearful at times and discussion regarding plan of care HEENT: MM pink/moist, no JVD Cardiovascular: S1-S2 regular rate and rhythm, no murmurs rubs or gallops.  NSR. Lungs: Even/nonlabored on room air.  Clear bilaterally Abdomen: Soft, nontender, bowel sounds x4 active.  Right posterior nephrostomy tube with clear yellow/brown drainage Musculoskeletal: No acute deformities Skin: Warm and dry, no rashes or lesions  LABS:  BMET Recent Labs  Lab 03/02/18 0952 03/02/18 1641 03/03/18 0315  NA 141 139 138  K 3.6 3.0* 3.3*  CL 105 106 110  CO2 28 26 24   BUN 14 13 11    CREATININE 0.67 0.53 0.85  GLUCOSE 103* 115* 146*    Electrolytes Recent Labs  Lab 03/02/18 0952 03/02/18 1641 03/03/18 0315  CALCIUM 9.2 8.2* 7.3*  MG  --   --  1.7    CBC Recent Labs  Lab 03/02/18 0952 03/02/18 1641 03/03/18 0315  WBC 6.3 5.0 15.3*  HGB 10.4* 9.2* 7.3*  HCT 31.3* 28.4* 22.9*  PLT 458* 324 267    Coag's Recent Labs  Lab 03/02/18 0952  INR 0.90    Sepsis Markers Recent Labs  Lab 03/02/18 1702  LATICACIDVEN 1.5  PROCALCITON 16.97    ABG No results for input(s): PHART, PCO2ART, PO2ART in the last 168 hours.  Liver Enzymes Recent Labs  Lab 03/02/18 1641  AST 40  ALT 27  ALKPHOS 178*  BILITOT 0.9  ALBUMIN 2.9*    Cardiac Enzymes No results for input(s): TROPONINI, PROBNP in the last 168 hours.  Glucose Recent Labs  Lab 03/02/18 1601 03/02/18 2145  GLUCAP 93 146*    Imaging Dg Chest 1 View  Result Date: 03/02/2018 CLINICAL DATA:  Chest pain EXAM: CHEST  1 VIEW COMPARISON:  02/11/2018 FINDINGS: Low lung volumes. Patchy atelectasis at the left base. No pleural effusion. Stable cardiomediastinal silhouette with minimal central congestion. No pneumothorax. IMPRESSION: Low lung volumes with minimal central congestion and patchy atelectasis at the left base Electronically Signed   By: [name]   On: 03/02/2018  19:59   Dg Abd 1 View  Result Date: 03/03/2018 CLINICAL DATA:  Right-sided abdominal pain. Right-sided nephrostomy tube placement yesterday. EXAM: ABDOMEN - 1 VIEW COMPARISON:  CT 02/16/2018. FINDINGS: Bilateral nephroureteral stents in place. Additionally right-sided nephrostomy tube in place. Normal bowel gas pattern. No bowel dilatation to suggest obstruction. Moderate stool in the ascending, distal transverse and descending colon. No evidence of free air on supine views. IMPRESSION: Bilateral ureteral stents in place. Right nephrostomy tube in place. Normal bowel gas pattern. Electronically Signed   By: [name]   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 Right percutaneous nephrostomy tube requested for urinary diversion. There is extravasation at the time of retrograde studies at the level of the distal left ureter as well. Bilateral ureteral stents are currently in place. EXAM: 1. ULTRASOUND GUIDANCE FOR PUNCTURE OF THE RIGHT RENAL COLLECTING SYSTEM. 2. RIGHT PERCUTANEOUS NEPHROSTOMY TUBE PLACEMENT. COMPARISON:  CT of the abdomen and pelvis on 02/16/2018 ANESTHESIA/SEDATION: 3.0 mg IV Versed; 100 mcg IV Fentanyl. Total Moderate Sedation Time 23 minutes. The patient's level of consciousness and physiologic status were continuously monitored during the procedure by Radiology nursing. CONTRAST:  20 mL Isovue-300 MEDICATIONS: 2 g IV Ancef. Antibiotic was administered in an appropriate time frame prior to skin puncture. FLUOROSCOPY TIME:  1 minutes and 18 seconds.  16.6 mGy. PROCEDURE: The procedure, risks, benefits, and alternatives were explained to the patient. Questions regarding the procedure were encouraged and answered. The patient understands and consents to the procedure. A time-out was performed prior to initiating the procedure. Both kidneys were inspected by ultrasound. The right flank region was prepped with chlorhexidine in a sterile fashion, and a sterile drape was applied covering the operative field. A sterile gown and sterile gloves were used for the procedure. Local anesthesia was provided with 1% Lidocaine. Ultrasound was used to localize the right kidney. Under direct ultrasound guidance, a 21 gauge needle was advanced into the renal collecting system. Ultrasound image documentation was performed. Aspiration of urine sample was performed followed by contrast injection. A transitional dilator was advanced over a  guidewire. Percutaneous tract dilatation was then performed over the guidewire. A 10-French percutaneous nephrostomy tube was then advanced and formed in the collecting system. Catheter position was confirmed by fluoroscopy after contrast injection. The catheter was attached to a gravity bag and secured at the skin with a Prolene retention suture and Stat-Lock device. COMPLICATIONS: None FINDINGS: Ultrasound demonstrates mild to moderate right-sided hydronephrosis. There is no significant left hydronephrosis. A 10 French nephrostomy tube was placed on the right and formed at the level of the renal pelvis. IMPRESSION: Right-sided percutaneous nephrostomy tube placement. A 10 French catheter was placed and formed in the renal pelvis. This will be left to gravity bag drainage. Ultrasound demonstrates no evidence of left-sided hydronephrosis currently. Attempt at placing a left percutaneous nephrostomy tube was deferred today. Left nephrostomy tube placement would be very difficult without some degree of hydronephrosis. Electronically Signed   By: Aletta Edouard M.D.   On: 03/02/2018 15:36     STUDIES:  CT ABD/Pelvis w/wo contrast 7/31 >>  CULTURES: BCx2 7/30 >>  UC 7/30 >>  ANTIBIOTICS: Vanco 7/30 >>  Zosyn 7/30 >>   SIGNIFICANT EVENTS: 7/30  Admit for nephrostomy tubes, only able to place right. Sepsis post procedure. 7/31 PCCM consulted for hypotension  LINES/TUBES: R Nephrostomy 7/30 >>   DISCUSSION: 57 y/o F squamous cell carcinoma of the cervix S/P radical hysterectomy (02/08/2018) with pelvic lymphadenectomy with subsequent bilateral ureterovaginal fistula, bilateral double J stents admitted for nephrostomy tube placement 7/30.  Developed sepsis post procedure.    ASSESSMENT / PLAN:  At Risk Atelectasis  P: Pulmonary hygiene- IS, mobilize  Follow intermittent CXR  O2 if needed to support sats >90%  Severe Sepsis - in setting of recent enterococcus UTI, multiple recent  instrumentation, post nephrostomy tube placement on R.  Hx of pan-sensitive enterococcus from urine culture.   P: Additional 1L NS now  NS at 125 ml/hr  Change blood pressure cuff to small adult  Trend lactate, PCT  Neosynephrine if needed to maintain MAP >65.  Currently does not need. Follow cultures ABX as above    Double J Stents - with exposed R stent  At Risk AKI Right Nephrostomy Tube Hypomagnesemia  Hypokalemia  P: Trend BMP / urinary output Replace electrolytes as indicated Avoid nephrotoxic agents, ensure adequate renal perfusion Nephrostomy care per IR Will need left nephrostomy tube at some point / once stabilized   Anemia - suspect hemodilution, r/o retroperitoneal bleeding  P: Trend CBC  Assess CT abd now to review for RP bleed  Would only transfuse if confirmed RP bleed, otherwise hold on blood products  Follow up Hgb at 1800  DM II  P: SSI   Pain - post nephrostomy tube placement  P: PRN tylenol for mild pain PRN oxycodone for moderate pain  PRN morphine for severe pain   Cervical Squamous Cell  Thyroid Nodule P: Per GYN ONC  FAMILY  - Updates: Patient updated on plan of care at bedside with interpreter.    Noe Gens, NP-C [facility] Pgr: [phone] or if no answer [phone] 03/03/2018, 8:35 AM

## 2018-03-03 NOTE — Progress Notes (Signed)
PROGRESS NOTE    Amy Morrow  JQB:[id number] DOB: 06/01/1961 DOA: 03/02/2018 PCP: Patient, No Pcp Per   Brief Narrative: Amy Morrow is a 57 y.o. 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  She was being monitored on short stay she became febrile, tachycardic and hypotensive.  Hospitalist service was requested for admission. She follows with Dr. Roanna Raider oncology.  She was diagnosed with cervical squamous cell carcinoma on 6/19.She underwent radical hysterectomy with pelvic lymphadenectomy With  Subsequent bilateral  ureterovaginal fistula.  She was recommended percutaneous nephrostomy tube placement to assist in decreasing urine flow through the vagina and she had this procedure done here today.  She also has bilateral ureteral stents put by urology earlier for bilateral ureterovaginal fistulas  Assessment & Plan:   Active Problems:   Sepsis ([place])  Sepsis/Septic shock:.Status post nephrostomy tube placement. Given another bolus today with improvement in the blood pressure.PCCM following. We will continue IV fluids.  Blood cultures, urine cultures have been sent.  High procalcitonin.  Lactic acid level elevated today.Started on broad-spectrum antibiotics with vancomycin and Zosyn. Check Xray didnot show PNA.  Anemia: Acute blood loss anemia secondary to nephrostomy tube placement.  We will continue to monitor H&H.  Undergoing CT abdomen/pelvis today to rule out retroperitoneal bleed.  Cervical cancer: She follows with Dr. Roanna Raider oncology.  She was diagnosed with cervical squamous cell carcinoma on 6/19.She underwent radical hysterectomy with pelvic lymphadenectomy.There  was no clinical involvement of the parametrium and no suspicious nodes.She subsequent  developed bilateral  ureterovaginal fistula.  She was recommended right percutaneous nephrostomy tube placement to assist in decreasing urine flow through the vagina and she had this procedure done here .  She also has bilateral ureteral stents put by urology earlier . She is being worked up as an outpatient for incidentally found  thyroid nodule on PET scan .    DVT prophylaxis: SCD Code Status: Full Family Communication: None at the bedside today Disposition Plan: Home when medically stable   Consultants: PCCM  Procedures: Right-sided nephrostomy tube placement  Antimicrobials: Vancomycin and Zosyn day 2  Subjective: Patient seen and examined the bedside this morning.  Blood pressure was on the lower side.  She was not tachycardic.  Looked calm and comfortable but complaining of some right-sided flank pain.  Objective: Vitals:   03/03/18 0600 03/03/18 0620 03/03/18 0800 03/03/18 0900  BP: (!) 70/45 (!) 82/52 (!) 92/53 (!) 95/49  Resp: 17  16 13   Temp:   98.4 F (36.9 C)   TempSrc:   Oral   SpO2: 100%  99% 98%    Intake/Output Summary (Last 24 hours) at 03/03/2018 1208 Last data filed at 03/03/2018 1137 Gross per 24 hour  Intake 1953.38 ml  Output 1075 ml  Net 878.38 ml   There were no vitals filed for this visit.  Examination:  General exam: Appears calm and comfortable ,Not in distress,average built HEENT:PERRL,Oral mucosa moist, Ear/Nose normal on gross exam Respiratory system: Bilateral equal air entry, normal vesicular breath sounds, no wheezes or crackles  Cardiovascular system: S1 & S2 heard, RRR. No JVD, murmurs, rubs, gallops or clicks. No pedal edema. Gastrointestinal system: Abdomen is nondistended, soft and nontender. No organomegaly or masses felt. Normal bowel sounds heard. Right-sided nephrostomy tube. Central nervous system: Alert and oriented. No focal neurological deficits. Extremities:  No edema, no clubbing ,no cyanosis, distal peripheral pulses  palpable. Skin: No rashes, lesions or ulcers,no icterus ,no pallor MSK: Normal muscle bulk,tone ,power Psychiatry: Judgement and insight appear normal. Mood & affect appropriate.     Data Reviewed: I have personally reviewed following labs and imaging studies  CBC: Recent Labs  Lab 03/02/18 0952 03/02/18 1641 03/03/18 0315 03/03/18 1033  WBC 6.3 5.0 15.3* 14.6*  NEUTROABS 3.9 4.8 14.8*  --   HGB 10.4* 9.2* 7.3* 7.5*  HCT 31.3* 28.4* 22.9* 23.1*  MCV 91.3 92.2 92.7 92.0  PLT 458* 324 267 202   Basic Metabolic Panel: Recent Labs  Lab 03/02/18 0952 03/02/18 1641 03/03/18 0315  NA 141 139 138  K 3.6 3.0* 3.3*  CL 105 106 110  CO2 28 26 24   GLUCOSE 103* 115* 146*  BUN 14 13 11   CREATININE 0.67 0.53 0.85  CALCIUM 9.2 8.2* 7.3*  MG  --   --  1.7   GFR: Estimated Creatinine Clearance: 61.9 mL/min (by C-G formula based on SCr of 0.85 mg/dL). Liver Function Tests: Recent Labs  Lab 03/02/18 1641  AST 40  ALT 27  ALKPHOS 178*  BILITOT 0.9  PROT 6.2*  ALBUMIN 2.9*   No results for input(s): LIPASE, AMYLASE in the last 168 hours. No results for input(s): AMMONIA in the last 168 hours. Coagulation Profile: Recent Labs  Lab 03/02/18 0952  INR 0.90   Cardiac Enzymes: No results for input(s): CKTOTAL, CKMB, CKMBINDEX, TROPONINI in the last 168 hours. BNP (last 3 results) No results for input(s): PROBNP in the last 8760 hours. HbA1C: No results for input(s): HGBA1C in the last 72 hours. CBG: Recent Labs  Lab 03/02/18 1601 03/02/18 2145 03/03/18 0825  GLUCAP 93 146* 124*   Lipid Profile: No results for input(s): CHOL, HDL, LDLCALC, TRIG, CHOLHDL, LDLDIRECT in the last 72 hours. Thyroid Function Tests: No results for input(s): TSH, T4TOTAL, FREET4, T3FREE, THYROIDAB in the last 72 hours. Anemia Panel: No results for input(s): VITAMINB12, FOLATE, FERRITIN, TIBC, IRON, RETICCTPCT in the last 72 hours. Sepsis Labs: Recent Labs  Lab 03/02/18 1702  03/03/18 1033  PROCALCITON 16.97  --   LATICACIDVEN 1.5 2.8*    Recent Results (from the past 240 hour(s))  MRSA PCR Screening     Status: None   Collection Time: 03/02/18  6:30 PM  Result Value Ref Range Status   MRSA by PCR NEGATIVE NEGATIVE Final    Comment:        The GeneXpert MRSA Assay (FDA approved for NASAL specimens only), is one component of a comprehensive MRSA colonization surveillance program. It is not intended to diagnose MRSA infection nor to guide or monitor treatment for MRSA infections. Performed at [facility], [place] [address], [place], [place] [address]          Radiology Studies: Dg Chest 1 View  Result Date: 03/02/2018 CLINICAL DATA:  Chest pain EXAM: CHEST  1 VIEW COMPARISON:  02/11/2018 FINDINGS: Low lung volumes. Patchy atelectasis at the left base. No pleural effusion. Stable cardiomediastinal silhouette with minimal central congestion. No pneumothorax. IMPRESSION: Low lung volumes with minimal central congestion and patchy atelectasis at the left base Electronically Signed   By: [name]   On: 03/02/2018 19:59   Dg Abd 1 View  Result Date: 03/03/2018 CLINICAL DATA:  Right-sided abdominal pain. Right-sided nephrostomy tube placement yesterday. EXAM: ABDOMEN - 1 VIEW COMPARISON:  CT 02/16/2018. FINDINGS: Bilateral nephroureteral stents in place. Additionally right-sided  nephrostomy tube in place. Normal bowel gas pattern. No bowel dilatation to suggest obstruction. Moderate stool in the ascending, distal transverse and descending colon. No evidence of free air on supine views. IMPRESSION: Bilateral ureteral stents in place. Right nephrostomy tube in place. Normal bowel gas pattern. Electronically Signed   By: [name]   On: 03/03/2018 04:10   Ir Nephrostomy Placement Right  Result Date: 03/02/2018 CLINICAL DATA:  Cervical carcinoma with postoperative uretero-vaginal fistulas and visualization of right  ureteral stent in the vagina. Right percutaneous nephrostomy tube requested for urinary diversion. There is extravasation at the time of retrograde studies at the level of the distal left ureter as well. Bilateral ureteral stents are currently in place. EXAM: 1. ULTRASOUND GUIDANCE FOR PUNCTURE OF THE RIGHT RENAL COLLECTING SYSTEM. 2. RIGHT PERCUTANEOUS NEPHROSTOMY TUBE PLACEMENT. COMPARISON:  CT of the abdomen and pelvis on 02/16/2018 ANESTHESIA/SEDATION: 3.0 mg IV Versed; 100 mcg IV Fentanyl. Total Moderate Sedation Time 23 minutes. The patient's level of consciousness and physiologic status were continuously monitored during the procedure by Radiology nursing. CONTRAST:  20 mL Isovue-300 MEDICATIONS: 2 g IV Ancef. Antibiotic was administered in an appropriate time frame prior to skin puncture. FLUOROSCOPY TIME:  1 minutes and 18 seconds.  16.6 mGy. PROCEDURE: The procedure, risks, benefits, and alternatives were explained to the patient. Questions regarding the procedure were encouraged and answered. The patient understands and consents to the procedure. A time-out was performed prior to initiating the procedure. Both kidneys were inspected by ultrasound. The right flank region was prepped with chlorhexidine in a sterile fashion, and a sterile drape was applied covering the operative field. A sterile gown and sterile gloves were used for the procedure. Local anesthesia was provided with 1% Lidocaine. Ultrasound was used to localize the right kidney. Under direct ultrasound guidance, a 21 gauge needle was advanced into the renal collecting system. Ultrasound image documentation was performed. Aspiration of urine sample was performed followed by contrast injection. A transitional dilator was advanced over a guidewire. Percutaneous tract dilatation was then performed over the guidewire. A 10-French percutaneous nephrostomy tube was then advanced and formed in the collecting system. Catheter position was confirmed by  fluoroscopy after contrast injection. The catheter was attached to a gravity bag and secured at the skin with a Prolene retention suture and Stat-Lock device. COMPLICATIONS: None FINDINGS: Ultrasound demonstrates mild to moderate right-sided hydronephrosis. There is no significant left hydronephrosis. A 10 French nephrostomy tube was placed on the right and formed at the level of the renal pelvis. IMPRESSION: Right-sided percutaneous nephrostomy tube placement. A 10 French catheter was placed and formed in the renal pelvis. This will be left to gravity bag drainage. Ultrasound demonstrates no evidence of left-sided hydronephrosis currently. Attempt at placing a left percutaneous nephrostomy tube was deferred today. Left nephrostomy tube placement would be very difficult without some degree of hydronephrosis. Electronically Signed   By: Aletta Edouard M.D.   On: 03/02/2018 15:36        Scheduled Meds: . iopamidol      . insulin aspart  0-5 Units Subcutaneous QHS  . insulin aspart  0-9 Units Subcutaneous TID WC  . polyethylene glycol  17 g Oral Daily   Continuous Infusions: . sodium chloride 125 mL/hr at 03/03/18 1137  . sodium chloride Stopped (03/03/18 1136)  . phenylephrine (NEO-SYNEPHRINE) Adult infusion    . piperacillin-tazobactam (ZOSYN)  IV Stopped (03/03/18 0950)  . sodium chloride    . vancomycin  LOS: 1 day    Time spent:35 mins. More than 50% of that time was spent in counseling and/or coordination of care.      Shelly Coss, MD [facility] Pager [phone]  If 7PM-7AM, please contact night-coverage [email or web address] Password [facility] 03/03/2018, 12:08 PM

## 2018-03-03 NOTE — Progress Notes (Signed)
CRITICAL VALUE ALERT  Critical Value:  Lactic acid 2.8  Date & Time Notied:  03/03/2018 1107H  Provider Notified: Dr. Chase Caller  Orders Received/Actions taken: [place] in process. CT awaiting and contacted to determine scan time

## 2018-03-03 NOTE — Progress Notes (Signed)
Gyn Onc Progress Note.  Events overnight: Patient is 4 weeks s/p radical hysterectomy for cervical cancer, complicated by postop uretero-vaginal fistula (right) at 1 week postop. Treated initially with retrograde ureteral stents. Remained "wet" with profuse leakage vaginally, therefore referred for Rt PCN.   On 03/02/18 IR placed right perc neph tube. Patient experienced severe pain. Became hypotensive, febrile post procedure. Admitted to ICU with sepsis and started on broad spectrum antibiotics with Zosyn and Vanc.   Has normalized BP with IVF resuscitation.  S:  She denies leakage from the bladder with foley and perc neph in. Pain slightly improved but still severe (right flank). Nausea and emesis x 1.  O: BP 110/63   Temp 98.8 F (37.1 C) (Oral)   Resp 18   LMP 11/16/2012   SpO2 93%  Appears generally unwell, but not in distress. Abd: soft, nontender. Flank: dressing dry Urine from bladder foley and right perc neph is tea colored (not frank blood). CBC    Component Value Date/Time   WBC 14.2 (H) 03/03/2018 1335   RBC 2.69 (L) 03/03/2018 1335   HGB 8.1 (L) 03/03/2018 1335   HGB 10.9 (L) 02/23/2018 1534   HCT 24.9 (L) 03/03/2018 1335   HCT 34.1 02/23/2018 1534   PLT 285 03/03/2018 1335   PLT 771 (H) 02/23/2018 1534   MCV 92.6 03/03/2018 1335   MCV 91 02/23/2018 1534   MCH 30.1 03/03/2018 1335   MCHC 32.5 03/03/2018 1335   RDW 14.7 03/03/2018 1335   RDW 12.8 02/23/2018 1534   LYMPHSABS 0.6 (L) 03/03/2018 1335   LYMPHSABS 1.3 02/23/2018 1534   MONOABS 0.6 03/03/2018 1335   EOSABS 0.1 03/03/2018 1335   EOSABS 0.2 02/23/2018 1534   BASOSABS 0.0 03/03/2018 1335   BASOSABS 0.1 02/23/2018 1534   BMET    Component Value Date/Time   NA 138 03/03/2018 0315   K 3.3 (L) 03/03/2018 0315   CL 110 03/03/2018 0315   CO2 24 03/03/2018 0315   GLUCOSE 146 (H) 03/03/2018 0315   BUN 11 03/03/2018 0315   CREATININE 0.85 03/03/2018 0315   CALCIUM 7.3 (L) 03/03/2018 0315   GFRNONAA >60 03/03/2018 0315   GFRAA >60 03/03/2018 0315   CTscan abd/pelvis: 03/03/18: decreased size of urinoma (complex pelvic collection). It is 2 x8cm and is puddling in dependent pelvis. Expect to spontaneously resolve. Stents in correct position.   A&P: sepsis (uro sepsis from occult pyelonephritis) s/p perc neph tube placement (sepsis likely secondary to bacterial "shower" from procedure). Agree with antibiotics - recommend continuing either empiric broad spectrum vs specific based on culture results, as an outpatient. Can remove foley when appropriate from hemodynamic standpoint.  If she redevelops uncontrolled leakage of fluid from the vagina at that time, could reconsider replacing foley for discharge. May need home heath organized for perc neph dressing care at home. Long term plan is reassessment of fistula with Dr Louis Meckel in 4-6 weeks and plan for surgical reimplantation at later date (>3 months) if persistent.  Her cervical cancer is completely treated surgically and no adjuvant therapy is indicated.  Thereasa Solo, MD

## 2018-03-04 ENCOUNTER — Other Ambulatory Visit: Payer: Self-pay

## 2018-03-04 ENCOUNTER — Inpatient Hospital Stay (HOSPITAL_COMMUNITY): Payer: Medicaid Other

## 2018-03-04 ENCOUNTER — Encounter (HOSPITAL_COMMUNITY): Payer: Self-pay

## 2018-03-04 DIAGNOSIS — A419 Sepsis, unspecified organism: Secondary | ICD-10-CM

## 2018-03-04 DIAGNOSIS — I959 Hypotension, unspecified: Secondary | ICD-10-CM

## 2018-03-04 HISTORY — DX: Sepsis, unspecified organism: A41.9

## 2018-03-04 LAB — CBC WITH DIFFERENTIAL/PLATELET
Basophils Absolute: 0 10*3/uL (ref 0.0–0.1)
Basophils Relative: 0 %
EOS PCT: 3 %
Eosinophils Absolute: 0.3 10*3/uL (ref 0.0–0.7)
HCT: 22.4 % — ABNORMAL LOW (ref 36.0–46.0)
Hemoglobin: 7.3 g/dL — ABNORMAL LOW (ref 12.0–15.0)
LYMPHS ABS: 0.8 10*3/uL (ref 0.7–4.0)
LYMPHS PCT: 8 %
MCH: 29.6 pg (ref 26.0–34.0)
MCHC: 32.6 g/dL (ref 30.0–36.0)
MCV: 90.7 fL (ref 78.0–100.0)
MONO ABS: 0.4 10*3/uL (ref 0.1–1.0)
MONOS PCT: 4 %
Neutro Abs: 8.7 10*3/uL — ABNORMAL HIGH (ref 1.7–7.7)
Neutrophils Relative %: 85 %
PLATELETS: 266 10*3/uL (ref 150–400)
RBC: 2.47 MIL/uL — AB (ref 3.87–5.11)
RDW: 14.2 % (ref 11.5–15.5)
WBC: 10.3 10*3/uL (ref 4.0–10.5)

## 2018-03-04 LAB — GLUCOSE, CAPILLARY
GLUCOSE-CAPILLARY: 96 mg/dL (ref 70–99)
GLUCOSE-CAPILLARY: 112 mg/dL — AB (ref 70–99)
Glucose-Capillary: 88 mg/dL (ref 70–99)
Glucose-Capillary: 106 mg/dL — ABNORMAL HIGH (ref 70–99)

## 2018-03-04 LAB — BASIC METABOLIC PANEL
Anion gap: 4 — ABNORMAL LOW (ref 5–15)
BUN: 6 mg/dL (ref 6–20)
CHLORIDE: 110 mmol/L (ref 98–111)
CO2: 26 mmol/L (ref 22–32)
Calcium: 7.8 mg/dL — ABNORMAL LOW (ref 8.9–10.3)
Creatinine, Ser: 0.52 mg/dL (ref 0.44–1.00)
GFR calc Af Amer: >60 mL/min (ref >60)
GLUCOSE: 100 mg/dL — AB (ref 70–99)
Potassium: 3.3 mmol/L — ABNORMAL LOW (ref 3.5–5.1)
Sodium: 140 mmol/L (ref 135–145)

## 2018-03-04 LAB — URINE CULTURE
CULTURE: NO GROWTH
Culture: <10000 — AB
Special Requests: NORMAL

## 2018-03-04 LAB — LACTIC ACID, PLASMA: Lactic Acid, Venous: 0.8 mmol/L (ref 0.5–1.9)

## 2018-03-04 LAB — MAGNESIUM: Magnesium: 2.1 mg/dL (ref 1.7–2.4)

## 2018-03-04 MED ORDER — METOCLOPRAMIDE HCL 5 MG/ML IJ SOLN
10.0000 mg | Freq: Three times a day (TID) | INTRAMUSCULAR | Status: DC
  Administered 2018-03-04 – 2018-03-05 (×3): 10 mg via INTRAVENOUS
  Filled 2018-03-04 (×3): qty 2

## 2018-03-04 MED ORDER — BISACODYL 10 MG RE SUPP
10.0000 mg | Freq: Every day | RECTAL | Status: DC | PRN
  Administered 2018-03-04: 10 mg via RECTAL
  Filled 2018-03-04: qty 1

## 2018-03-04 MED ORDER — BUTALBITAL-APAP-CAFFEINE 50-325-40 MG PO TABS: 1.0000 | ORAL_TABLET | ORAL | Status: DC | PRN

## 2018-03-04 MED ORDER — LACTATED RINGERS IV BOLUS
1000.0000 mL | Freq: Once | INTRAVENOUS | Status: AC
  Administered 2018-03-04: 1000 mL via INTRAVENOUS

## 2018-03-04 MED ORDER — SIMETHICONE 40 MG/0.6ML PO SUSP
40.0000 mg | Freq: Four times a day (QID) | ORAL | Status: DC | PRN
  Filled 2018-03-04: qty 0.6

## 2018-03-04 MED ORDER — POTASSIUM CHLORIDE CRYS ER 20 MEQ PO TBCR
40.0000 meq | EXTENDED_RELEASE_TABLET | Freq: Once | ORAL | Status: AC
  Administered 2018-03-04: 40 meq via ORAL
  Filled 2018-03-04: qty 2

## 2018-03-04 MED ORDER — FAMOTIDINE IN NACL 20-0.9 MG/50ML-% IV SOLN
20.0000 mg | Freq: Two times a day (BID) | INTRAVENOUS | Status: DC
  Administered 2018-03-04 – 2018-03-06 (×4): 20 mg via INTRAVENOUS
  Filled 2018-03-04 (×4): qty 50

## 2018-03-04 NOTE — Progress Notes (Signed)
PULMONARY / CRITICAL CARE MEDICINE   Name: Amy Morrow MRN: [id number] DOB: Jun 23, 1961    ADMISSION DATE:  03/02/2018 CONSULTATION DATE: 03/03/2018  REFERRING MD: Dr. Elbert Ewings / [facility]   CHIEF COMPLAINT: Headache, fever  HISTORY OF PRESENT ILLNESS: 57 year old Spanish-speaking female who presented to [facility] on 7/30 for right-sided nephrostomy tube placement by interventional radiology.    She carries a history of newly diagnosed (01/20/2018) squamous cell carcinoma of the cervix as/P radical hysterectomy (02/08/2018) with pelvic lymphadenectomy with subsequent bilateral ureterovaginal fistula, diabetes type 2.  She is followed by Dr. Denman George of GYN oncology.  She returned to the ER on 7/11 with reports of nausea.  Urinalysis at that time showed blood but no evidence for UTI.  Urine culture was obtained and resulted on 7/14 which grew 70,000 colonies of pansensitive Enterococcus faecalis..  She was followed up in regards to symptoms and felt better.  She had an office visit on 7/16 with GYN oncology with reports of leaking of urine from vagina.  She was directly admitted to the hospital for further evaluation.  On 7/16 she was taken to the OR per [facility] for cystoscopy and retrograde pyelography with bilateral double-J stent placement.  She was found to have bilateral ureteral defects in the distal 2 cm of the ureters that were resulting in ureteral vaginal fistula.  At that time she was treated with IV Zosyn for 2 days and transition to p.o. Augmentin which she completed.  Postprocedure 7/30 she became febrile to 102.4, tachycardic, vomiting and hypotensive.  Hospitalist service was consulted for admission.  The patient was admitted to stepdown and treated with empiric antibiotics & IV fluids.  She was pancultured.  Chest x-ray at that time demonstrated low lung volumes with mild central congestion and patchy basilar atelectasis.  On 7/31 she developed worsening hypotension with pressures  into the 70s.  Additionally, her hemoglobin was noted to drop from 9-7.3.  PCCM consulted for evaluation of sepsis.   SUBJECTIVE/INTERVAL 8/1: He did have one isolated episode of hypotension last night.  This was asymptomatic.  Overall lactic acid is normal,White blood cells improved, and afebrile.  VITAL SIGNS: Blood Pressure 127/84   Temperature 98.9 F (37.2 C) (Oral)   Respiration 15   Last Menstrual Period 11/16/2012   Oxygen Saturation 96%   HEMODYNAMICS:    VENTILATOR SETTINGS:    INTAKE / OUTPUT: I/O last 3 completed shifts: In: 5723.4 [P.O.:300; I.V.:3974.4; IV Piggyback:1449] Out: 3825 [Urine:3825]  PHYSICAL EXAMINATION: General: 57 year old non-English-speaking female currently resting comfortably in bed HEENT normocephalic atraumatic no jugular venous distention mucous membranes moist Pulmonary: Clear to auscultation Cardiac: Regular rate and rhythm no murmur rub or gallop Abdomen: Soft, nontender. GU: Right nephrostomy tube surgical site tender.  Clear yellow urine. Remedies: Warm dry no edema  LABS:  BMET Recent Labs  Lab 03/02/18 1641 03/03/18 0315 03/04/18 0326  NA 139 138 140  K 3.0* 3.3* 3.3*  CL 106 110 110  CO2 26 24 26   BUN 13 11 6   CREATININE 0.53 0.85 0.52  GLUCOSE 115* 146* 100*    Electrolytes Recent Labs  Lab 03/02/18 1641 03/03/18 0315 03/04/18 0326  CALCIUM 8.2* 7.3* 7.8*  MG  --  1.7 2.1    CBC Recent Labs  Lab 03/03/18 1033 03/03/18 1335 03/04/18 0326  WBC 14.6* 14.2* 10.3  HGB 7.5* 8.1* 7.3*  HCT 23.1* 24.9* 22.4*  PLT 257 285 266    Coag's Recent Labs  Lab 03/02/18 [phone]  INR 0.90    Sepsis Markers Recent Labs  Lab 03/02/18 1702 03/03/18 1033 03/04/18 0326  LATICACIDVEN 1.5 2.8* 0.8  PROCALCITON 16.97  --   --     ABG No results for input(s): PHART, PCO2ART, PO2ART in the last 168 hours.  Liver Enzymes Recent Labs  Lab 03/02/18 1641  AST 40  ALT 27  ALKPHOS 178*  BILITOT 0.9  ALBUMIN  2.9*    Cardiac Enzymes No results for input(s): TROPONINI, PROBNP in the last 168 hours.  Glucose Recent Labs  Lab 03/02/18 2145 03/03/18 0825 03/03/18 1304 03/03/18 1712 03/03/18 2043 03/04/18 0747  GLUCAP 146* 124* 108* 112* 103* 88    Imaging Ct Abdomen Pelvis W Wo Contrast  Result Date: 03/03/2018 CLINICAL DATA:  Persistent right flank pain and progressive anemia status post right percutaneous nephrostomy. New diagnosis of cervical cancer. EXAM: CT ABDOMEN AND PELVIS WITHOUT AND WITH CONTRAST TECHNIQUE: Multidetector CT imaging of the abdomen and pelvis was performed following the standard protocol before and following the bolus administration of intravenous contrast. CONTRAST:  19mL ISOVUE-300 IOPAMIDOL (ISOVUE-300) INJECTION 61% COMPARISON:  CT 02/16/2018. FINDINGS: Lower chest: New trace bilateral pleural effusions with dependent airspace opacities at both lung bases, likely atelectasis. Hepatobiliary: Pre contrast images demonstrate decreased hepatic density consistent with steatosis. There is stable ill-defined low-density in segment for the liver, likely focal fat. No new or enlarging hepatic lesions. No evidence of gallstones, gallbladder wall thickening or biliary dilatation. Pancreas: Unremarkable. No pancreatic ductal dilatation or surrounding inflammatory changes. Spleen: Normal in size without focal abnormality. Adrenals/Urinary Tract: Both adrenal glands appear normal. The right percutaneous nephrostomy appears well positioned. Bilateral nephroureteral catheters are unchanged in position from recent radiographs. There is a small amount of air within the right renal collecting system. No evidence of urinary tract calculus. There is no perinephric hematoma. There is mild ureteral dilatation bilaterally. Complex fluid collection adjacent to the vaginal cuff has decreased in size, now measuring approximately 2.6 x 8.0 cm transverse on image 133/8. There is minimal residual air in  this collection. Delayed post-contrast images demonstrate bilateral renal excretion, but no significant contrast in the ureters, bladder or the pelvic fluid collection. There is mild bladder wall thickening. Stomach/Bowel: No evidence of bowel wall thickening, distention or surrounding inflammatory change. The appendix appears normal. Vascular/Lymphatic: There are no enlarged abdominal or pelvic lymph nodes. Mild aortic and branch vessel atherosclerosis. Retroaortic left renal vein. Reproductive: Hysterectomy. No adnexal mass. As above, the complex pelvic fluid collection and surrounding inflammatory changes in the pelvis have improved. No new or enlarging collections. Other: Postsurgical changes in the anterior abdominal wall. No free air. Musculoskeletal: No acute or significant osseous findings. Stable lumbar spine degenerative changes. IMPRESSION: 1. Interval improvement in complex pelvic fluid collection and surrounding inflammatory changes status post bilateral nephroureteral catheter and right percutaneous nephrostomy placement. 2. No evidence of perinephric hematoma or other signs of acute bleeding. 3. Mild bilateral hydroureter, improved from preoperative CT. There is bilateral excretion of contrast into the renal collecting systems, but no significant opacification of the ureters or bladder. 4. New dependent opacities at both lung bases, likely atelectasis. Electronically Signed   By: [name]   On: 03/03/2018 13:03   Dg Chest Port 1 View  Result Date: 03/04/2018 CLINICAL DATA:  Sepsis. EXAM: PORTABLE CHEST 1 VIEW COMPARISON:  03/02/2018. FINDINGS: Poor inspiration. Normal sized heart. Mildly prominent interstitial markings. Mild patchy density in both lower lung zones. Unremarkable bones. IMPRESSION: 1. Poor inspiration with  mild patchy atelectasis or pneumonia at both lung bases with minimal progression. 2. Mild chronic interstitial lung disease. Electronically Signed   By: Claudie Revering  M.D.   On: 03/04/2018 08:54     STUDIES:  CT ABD/Pelvis w/wo contrast 7/31 >>  CULTURES: BCx2 7/30 >>  UC 7/30 >>  ANTIBIOTICS: Vanco 7/30 >>  Zosyn 7/30 >>   SIGNIFICANT EVENTS: 7/30  Admit for nephrostomy tubes, only able to place right. Sepsis post procedure. 7/31 PCCM consulted for hypotension   LINES/TUBES: R Nephrostomy 7/30 >>   DISCUSSION: 57 y/o F squamous cell carcinoma of the cervix S/P radical hysterectomy (02/08/2018) with pelvic lymphadenectomy with subsequent bilateral ureterovaginal fistula, bilateral double J stents admitted for nephrostomy tube placement 7/30.  Developed sepsis post procedure.   Now hemodynamically stable Continue antibiotics Continue hydration Cortisol for completeness We will s/o   ASSESSMENT / PLAN:  Severe Sepsis secondary to ureteral vaginal fistulas and multiple recent instrumentations- in setting of recent enterococcus UTI, multiple recent instrumentation, post nephrostomy tube placement on R.  Hx of pan-sensitive enterococcus from urine culture.   -Currently hemodynamically stable as of 8/1 Plan Changing IV fluids to balanced crystalloid Continue current antibiotics as we await culture data Trend procalcitonin Check cortisol for completeness  S/p Double J Stents - with exposed R stent  Now w/ Right Nephrostomy Tube Plan Continue close observation Of blood chemistries Strict intake and output If has uncontrollable leakage from bladder can have Foley catheter placed at discharge Will need left nephrostomy tube at some point  Anemia - suspect hemodilution, CT abdomen without evidence of bleed Plan Trend CBC Transfuse for hemoglobin less than 7  DM II   plan Sliding scale insulin  Pain - post nephrostomy tube placement  Plan PRN analgesia  Cervical Squamous Cell  Thyroid Nodule Plan Per GYN/ONC  FAMILY  - Updates: Patient updated on plan of care at bedside with interpreter.    Erick Colace ACNP-BC [place] Pager # [phone] OR # [phone] if no answer

## 2018-03-04 NOTE — Progress Notes (Signed)
[facility] Progress Note Patient Name: NYESHIA MYSLIWIEC DOB: [birth year] MRN: [id number]   Date of Service  03/04/2018  HPI/Events of Note  Hypotension associated with large volume output from her Nephrostomy  eICU Interventions  Lactated Ringers 1000 ml iv bolus over 2 hours x 1        Odessia Asleson U Haydon Kalmar 03/04/2018, 3:14 AM

## 2018-03-04 NOTE — Progress Notes (Addendum)
PROGRESS NOTE    [name]  BOF:[id number] DOB: 05/10/1961 DOA: 03/02/2018 PCP: Patient, No Pcp Per   Brief Narrative: Amy Morrow is a 57 y.o. 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  She was being monitored on short stay when she became febrile, tachycardic and hypotensive.  Hospitalist service was requested for admission. She follows with Dr. Roanna Raider oncology.  She was diagnosed with cervical squamous cell carcinoma on 6/19.She underwent radical hysterectomy with pelvic lymphadenectomy With  Subsequent bilateral  ureterovaginal fistula.  She was recommended percutaneous nephrostomy tube placement to assist in decreasing urine flow through the vagina and she had this procedure done here today.  She also has bilateral ureteral stents put by urology earlier for bilateral ureterovaginal fistulas.  Currently she is being managed for sepsis/septic shock.  Assessment & Plan:   Principal Problem:   Sepsis ([place]) Active Problems:   Cervical cancer ([place])   Ureterovaginal fistula   Anemia   Hypotension  Sepsis/Septic shock:.BP stable now. Status post nephrostomy tube placement.PCCM following. We will continue IV fluids.  Blood cultures, urine cultures have been sent.urine culture showed no growth.  She had procalcitonin.  Lactic acid level elevated but has normalized.On broad-spectrum antibiotics with vancomycin and Zosyn. Check Xray didnot show PNA.  Anemia: Acute blood loss anemia secondary to nephrostomy tube placement.  We will continue to monitor H&H.   CT abdomen/pelvis today did not show retroperitoneal bleed.  Cervical cancer: She follows with Dr. Roanna Raider oncology.  She was diagnosed with cervical squamous cell carcinoma on 6/19.She underwent radical  hysterectomy with pelvic lymphadenectomy.There  was no clinical involvement of the parametrium and no suspicious nodes.She subsequent developed bilateral  ureterovaginal fistula,more on right.   She is being worked up as an outpatient for incidentally found  thyroid nodule on PET scan .  Bilateral ureterovaginal fistula: She was recommended right percutaneous nephrostomy tube placement to assist in decreasing urine flow through the vagina and she had this procedure done here . She is still having a lot of drainage from vagina, most likely urine from ureterovaginal fistula.  She also had bilateral ureteral stents put by urology earlier .  She will follow-up with Dr. Louis Meckel in 4 to 6-week for reassessment of the fistula and for possible further surgical intervention.    DVT prophylaxis: SCD Code Status: Full Family Communication: None at the bedside today Disposition Plan: Home when medically stable   Consultants: PCCM  Procedures: Right-sided nephrostomy tube placement  Antimicrobials: Vancomycin and Zosyn day 3  Subjective: Patient seen and examined the bedside this morning.  BP stable this morning.  Afebrile.Did vomit once today.  Objective: Vitals:   03/04/18 0900 03/04/18 1000 03/04/18 1100 03/04/18 1200  BP: 124/71 127/84 140/74 134/67  Resp: 15 15 16 15   Temp:    98.7 F (37.1 C)  TempSrc:    Oral  SpO2: 97% 96% 94% 98%    Intake/Output Summary (Last 24 hours) at 03/04/2018 1235 Last data filed at 03/04/2018 1000 Gross per 24 hour  Intake 4508.66 ml  Output 2900 ml  Net 1608.66 ml   There were no vitals filed for this visit.  Examination:  General exam: Appears calm and comfortable ,Not in distress,average built HEENT:PERRL,Oral mucosa moist, Ear/Nose normal on gross exam Respiratory system: Bilateral equal air entry, normal vesicular breath sounds, no  wheezes or crackles  Cardiovascular system: S1 & S2 heard, RRR. No JVD, murmurs, rubs, gallops or clicks. No pedal  edema. Gastrointestinal system: Abdomen is nondistended, soft and nontender. No organomegaly or masses felt. Normal bowel sounds heard. Right-sided nephrostomy tube with clean urine in the bag Central nervous system: Alert and oriented. No focal neurological deficits. Extremities: No edema, no clubbing ,no cyanosis, distal peripheral pulses palpable. Skin: No rashes, lesions or ulcers,no icterus ,no pallor MSK: Normal muscle bulk,tone ,power Psychiatry: Judgement and insight appear normal. Mood & affect appropriate.     Data Reviewed: I have personally reviewed following labs and imaging studies  CBC: Recent Labs  Lab 03/02/18 0952 03/02/18 1641 03/03/18 0315 03/03/18 1033 03/03/18 1335 03/04/18 0326  WBC 6.3 5.0 15.3* 14.6* 14.2* 10.3  NEUTROABS 3.9 4.8 14.8*  --  12.8* 8.7*  HGB 10.4* 9.2* 7.3* 7.5* 8.1* 7.3*  HCT 31.3* 28.4* 22.9* 23.1* 24.9* 22.4*  MCV 91.3 92.2 92.7 92.0 92.6 90.7  PLT 458* 324 267 257 285 354   Basic Metabolic Panel: Recent Labs  Lab 03/02/18 0952 03/02/18 1641 03/03/18 0315 03/04/18 0326  NA 141 139 138 140  K 3.6 3.0* 3.3* 3.3*  CL 105 106 110 110  CO2 28 26 24 26   GLUCOSE 103* 115* 146* 100*  BUN 14 13 11 6   CREATININE 0.67 0.53 0.85 0.52  CALCIUM 9.2 8.2* 7.3* 7.8*  MG  --   --  1.7 2.1   GFR: Estimated Creatinine Clearance: 65.7 mL/min (by C-G formula based on SCr of 0.52 mg/dL). Liver Function Tests: Recent Labs  Lab 03/02/18 1641  AST 40  ALT 27  ALKPHOS 178*  BILITOT 0.9  PROT 6.2*  ALBUMIN 2.9*   No results for input(s): LIPASE, AMYLASE in the last 168 hours. No results for input(s): AMMONIA in the last 168 hours. Coagulation Profile: Recent Labs  Lab 03/02/18 0952  INR 0.90   Cardiac Enzymes: No results for input(s): CKTOTAL, CKMB, CKMBINDEX, TROPONINI in the last 168 hours. BNP (last 3 results) No results for input(s): PROBNP in the last 8760 hours. HbA1C: No results for input(s): HGBA1C in the last 72  hours. CBG: Recent Labs  Lab 03/03/18 1304 03/03/18 1712 03/03/18 2043 03/04/18 0747 03/04/18 1142  GLUCAP 108* 112* 103* 88 106*   Lipid Profile: No results for input(s): CHOL, HDL, LDLCALC, TRIG, CHOLHDL, LDLDIRECT in the last 72 hours. Thyroid Function Tests: No results for input(s): TSH, T4TOTAL, FREET4, T3FREE, THYROIDAB in the last 72 hours. Anemia Panel: No results for input(s): VITAMINB12, FOLATE, FERRITIN, TIBC, IRON, RETICCTPCT in the last 72 hours. Sepsis Labs: Recent Labs  Lab 03/02/18 1702 03/03/18 1033 03/04/18 0326  PROCALCITON 16.97  --   --   LATICACIDVEN 1.5 2.8* 0.8    Recent Results (from the past 240 hour(s))  Urine Culture     Status: Abnormal   Collection Time: 03/02/18  3:57 PM  Result Value Ref Range Status   Specimen Description   Final    KIDNEY RIGHT Performed at [facility] [address], [place], [place] [address]    Special Requests   Final    Normal Performed at [facility], [place] [address], [place], [place] [address]    Culture (A)  Final    <10,000 COLONIES/mL INSIGNIFICANT GROWTH Performed at [facility] [address], [place], [place] [address]    Report Status 03/04/2018 FINAL  Final  Culture, blood (routine x 2)  Status: None (Preliminary result)   Collection Time: 03/02/18  5:02 PM  Result Value Ref Range Status   Specimen Description   Final    BLOOD LEFT ANTECUBITAL Performed at [facility] [address], [place], [place] [address]    Special Requests   Final    BOTTLES DRAWN AEROBIC AND ANAEROBIC Blood Culture adequate volume Performed at [facility] [address], [place], [place] [address]    Culture   Final    NO GROWTH < 24 HOURS Performed at [facility] [address], [place], [place] [address]    Report Status PENDING  Incomplete  Culture, blood (routine x 2)     Status: None (Preliminary result)    Collection Time: 03/02/18  5:33 PM  Result Value Ref Range Status   Specimen Description   Final    BLOOD LEFT HAND Performed at [facility] [address] [place], [place] [address]    Special Requests   Final    BOTTLES DRAWN AEROBIC AND ANAEROBIC Blood Culture results may not be optimal due to an inadequate volume of blood received in culture bottles Performed at [facility] [address], [place], [place] [address]    Culture   Final    NO GROWTH < 24 HOURS Performed at [facility] [address] [place], [place] [address]    Report Status PENDING  Incomplete  MRSA PCR Screening     Status: None   Collection Time: 03/02/18  6:30 PM  Result Value Ref Range Status   MRSA by PCR NEGATIVE NEGATIVE Final    Comment:        The GeneXpert MRSA Assay (FDA approved for NASAL specimens only), is one component of a comprehensive MRSA colonization surveillance program. It is not intended to diagnose MRSA infection nor to guide or monitor treatment for MRSA infections. Performed at [facility], [place] [address] [place], [place] [address]   Culture, Urine     Status: None   Collection Time: 03/03/18 11:26 AM  Result Value Ref Range Status   Specimen Description   Final    URINE, RANDOM URINE BAG Performed at [facility] [address], [place], [place] [address]    Special Requests   Final    NONE Performed at [facility], [place] [address], [place], [place] [address]    Culture   Final    NO GROWTH Performed at [facility], [place] [address], [place], [place] [address]    Report Status 03/04/2018 FINAL  Final         Radiology Studies: Ct Abdomen Pelvis W Wo Contrast  Result Date: 03/03/2018 CLINICAL DATA:  Persistent right flank pain and progressive anemia status post right percutaneous nephrostomy. New diagnosis of cervical cancer. EXAM: CT ABDOMEN AND  PELVIS WITHOUT AND WITH CONTRAST TECHNIQUE: Multidetector CT imaging of the abdomen and pelvis was performed following the standard protocol before and following the bolus administration of intravenous contrast. CONTRAST:  133mL ISOVUE-300 IOPAMIDOL (ISOVUE-300) INJECTION 61% COMPARISON:  CT 02/16/2018. FINDINGS: Lower chest: New trace bilateral pleural effusions with dependent airspace opacities at both lung bases, likely atelectasis. Hepatobiliary: Pre contrast images demonstrate decreased hepatic density consistent with steatosis. There is stable ill-defined low-density in segment for the liver, likely focal fat. No new or enlarging hepatic lesions. No evidence of gallstones, gallbladder wall thickening or biliary dilatation. Pancreas: Unremarkable. No  pancreatic ductal dilatation or surrounding inflammatory changes. Spleen: Normal in size without focal abnormality. Adrenals/Urinary Tract: Both adrenal glands appear normal. The right percutaneous nephrostomy appears well positioned. Bilateral nephroureteral catheters are unchanged in position from recent radiographs. There is a small amount of air within the right renal collecting system. No evidence of urinary tract calculus. There is no perinephric hematoma. There is mild ureteral dilatation bilaterally. Complex fluid collection adjacent to the vaginal cuff has decreased in size, now measuring approximately 2.6 x 8.0 cm transverse on image 133/8. There is minimal residual air in this collection. Delayed post-contrast images demonstrate bilateral renal excretion, but no significant contrast in the ureters, bladder or the pelvic fluid collection. There is mild bladder wall thickening. Stomach/Bowel: No evidence of bowel wall thickening, distention or surrounding inflammatory change. The appendix appears normal. Vascular/Lymphatic: There are no enlarged abdominal or pelvic lymph nodes. Mild aortic and branch vessel atherosclerosis. Retroaortic left renal vein.  Reproductive: Hysterectomy. No adnexal mass. As above, the complex pelvic fluid collection and surrounding inflammatory changes in the pelvis have improved. No new or enlarging collections. Other: Postsurgical changes in the anterior abdominal wall. No free air. Musculoskeletal: No acute or significant osseous findings. Stable lumbar spine degenerative changes. IMPRESSION: 1. Interval improvement in complex pelvic fluid collection and surrounding inflammatory changes status post bilateral nephroureteral catheter and right percutaneous nephrostomy placement. 2. No evidence of perinephric hematoma or other signs of acute bleeding. 3. Mild bilateral hydroureter, improved from preoperative CT. There is bilateral excretion of contrast into the renal collecting systems, but no significant opacification of the ureters or bladder. 4. New dependent opacities at both lung bases, likely atelectasis. Electronically Signed   By: [name]   On: 03/03/2018 13:03   Dg Chest 1 View  Result Date: 03/02/2018 CLINICAL DATA:  Chest pain EXAM: CHEST  1 VIEW COMPARISON:  02/11/2018 FINDINGS: Low lung volumes. Patchy atelectasis at the left base. No pleural effusion. Stable cardiomediastinal silhouette with minimal central congestion. No pneumothorax. IMPRESSION: Low lung volumes with minimal central congestion and patchy atelectasis at the left base Electronically Signed   By: [name]   On: 03/02/2018 19:59   Dg Abd 1 View  Result Date: 03/03/2018 CLINICAL DATA:  Right-sided abdominal pain. Right-sided nephrostomy tube placement yesterday. EXAM: ABDOMEN - 1 VIEW COMPARISON:  CT 02/16/2018. FINDINGS: Bilateral nephroureteral stents in place. Additionally right-sided nephrostomy tube in place. Normal bowel gas pattern. No bowel dilatation to suggest obstruction. Moderate stool in the ascending, distal transverse and descending colon. No evidence of free air on supine views. IMPRESSION: Bilateral ureteral  stents in place. Right nephrostomy tube in place. Normal bowel gas pattern. Electronically Signed   By: [name]   On: 03/03/2018 04:10   Dg Chest Port 1 View  Result Date: 03/04/2018 CLINICAL DATA:  Sepsis. EXAM: PORTABLE CHEST 1 VIEW COMPARISON:  03/02/2018. FINDINGS: Poor inspiration. Normal sized heart. Mildly prominent interstitial markings. Mild patchy density in both lower lung zones. Unremarkable bones. IMPRESSION: 1. Poor inspiration with mild patchy atelectasis or pneumonia at both lung bases with minimal progression. 2. Mild chronic interstitial lung disease. Electronically Signed   By: Claudie Revering M.D.   On: 03/04/2018 08:54   Ir Nephrostomy Placement Right  Result Date: 03/02/2018 CLINICAL DATA:  Cervical carcinoma with postoperative uretero-vaginal fistulas and visualization of right ureteral stent in the vagina. Right percutaneous nephrostomy tube requested for urinary diversion. There is extravasation at the time of retrograde studies at the level  of the distal left ureter as well. Bilateral ureteral stents are currently in place. EXAM: 1. ULTRASOUND GUIDANCE FOR PUNCTURE OF THE RIGHT RENAL COLLECTING SYSTEM. 2. RIGHT PERCUTANEOUS NEPHROSTOMY TUBE PLACEMENT. COMPARISON:  CT of the abdomen and pelvis on 02/16/2018 ANESTHESIA/SEDATION: 3.0 mg IV Versed; 100 mcg IV Fentanyl. Total Moderate Sedation Time 23 minutes. The patient's level of consciousness and physiologic status were continuously monitored during the procedure by Radiology nursing. CONTRAST:  20 mL Isovue-300 MEDICATIONS: 2 g IV Ancef. Antibiotic was administered in an appropriate time frame prior to skin puncture. FLUOROSCOPY TIME:  1 minutes and 18 seconds.  16.6 mGy. PROCEDURE: The procedure, risks, benefits, and alternatives were explained to the patient. Questions regarding the procedure were encouraged and answered. The patient understands and consents to the procedure. A time-out was performed prior to initiating  the procedure. Both kidneys were inspected by ultrasound. The right flank region was prepped with chlorhexidine in a sterile fashion, and a sterile drape was applied covering the operative field. A sterile gown and sterile gloves were used for the procedure. Local anesthesia was provided with 1% Lidocaine. Ultrasound was used to localize the right kidney. Under direct ultrasound guidance, a 21 gauge needle was advanced into the renal collecting system. Ultrasound image documentation was performed. Aspiration of urine sample was performed followed by contrast injection. A transitional dilator was advanced over a guidewire. Percutaneous tract dilatation was then performed over the guidewire. A 10-French percutaneous nephrostomy tube was then advanced and formed in the collecting system. Catheter position was confirmed by fluoroscopy after contrast injection. The catheter was attached to a gravity bag and secured at the skin with a Prolene retention suture and Stat-Lock device. COMPLICATIONS: None FINDINGS: Ultrasound demonstrates mild to moderate right-sided hydronephrosis. There is no significant left hydronephrosis. A 10 French nephrostomy tube was placed on the right and formed at the level of the renal pelvis. IMPRESSION: Right-sided percutaneous nephrostomy tube placement. A 10 French catheter was placed and formed in the renal pelvis. This will be left to gravity bag drainage. Ultrasound demonstrates no evidence of left-sided hydronephrosis currently. Attempt at placing a left percutaneous nephrostomy tube was deferred today. Left nephrostomy tube placement would be very difficult without some degree of hydronephrosis. Electronically Signed   By: Aletta Edouard M.D.   On: 03/02/2018 15:36        Scheduled Meds: . insulin aspart  0-5 Units Subcutaneous QHS  . insulin aspart  0-9 Units Subcutaneous TID WC  . polyethylene glycol  17 g Oral Daily   Continuous Infusions: . sodium chloride 125 mL/hr at  03/04/18 0200  . sodium chloride Stopped (03/03/18 7209)  . phenylephrine (NEO-SYNEPHRINE) Adult infusion    . piperacillin-tazobactam (ZOSYN)  IV 12.5 mL/hr at 03/04/18 1000  . sodium chloride    . vancomycin Stopped (03/03/18 2200)     LOS: 2 days    Time spent:35 mins. More than 50% of that time was spent in counseling and/or coordination of care.      Shelly Coss, MD [facility] Pager [phone]  If 7PM-7AM, please contact night-coverage [email or web address] Password [facility] 03/04/2018, 12:35 PM

## 2018-03-04 NOTE — Progress Notes (Signed)
Patient with systolic BP in the 73'Z. Patient asymptomatic, HR ranging from 70's to 80's. Elink MD notified. New order given. Patient with significant amount of leakage vaginally.  Will continue to monitor closely.

## 2018-03-04 NOTE — Progress Notes (Signed)
Referring Physician(s): [name],[name]  Supervising Physician: Corrie Mckusick  Patient Status:  [facility] - In-pt  Chief Complaint: Right flank pain  Subjective:  Right nephrostomy tube placed 03/02/2018 by Dr. Kathlene Cote. Patient awake and alert laying in bed. Complains of right flank pain- stable at this time. Approximately 550 mL tea-colored urine in collection bag. RN reports last night patient had leakage from vagina, periwick applied which yielding approximately 1350 mL tea-colored urine since last night. Also reports patient wet the bed last evening.  CXR 03/04/2018: 1. Poor inspiration with mild patchy atelectasis or pneumonia at both lung bases with minimal progression. 2. Mild chronic interstitial lung disease.  CT abdomen/pelvis 03/03/2018: 1. Interval improvement in complex pelvic fluid collection and surrounding inflammatory changes status post bilateral nephroureteral catheter and right percutaneous nephrostomy placement. 2. No evidence of perinephric hematoma or other signs of acute bleeding. 3. Mild bilateral hydroureter, improved from preoperative CT. There is bilateral excretion of contrast into the renal collecting systems, but no significant opacification of the ureters or bladder. 4. New dependent opacities at both lung bases, likely atelectasis.   Allergies: Patient has no known allergies.  Medications: Prior to Admission medications   Medication Sig Start Date End Date Taking? Authorizing Provider  acetaminophen (TYLENOL) 500 MG tablet Take 500 mg by mouth every 6 (six) hours as needed for fever.   Yes [provider]  docusate sodium (COLACE) 100 MG capsule Take 1 capsule (100 mg total) by mouth 2 (two) times daily. 02/09/18  Yes [name], MD  metFORMIN (GLUCOPHAGE XR) 500 MG 24 hr tablet Take 1 tablet (500 mg total) by mouth daily with breakfast. 02/23/18  Yes [name], [name]  polyethylene glycol [facility] / Floria Raveling) packet Take 17 g by mouth  daily. 02/18/18  Yes [name], MD  senna (SENOKOT) 8.6 MG TABS tablet Take 1 tablet (8.6 mg total) by mouth at bedtime. 02/18/18  Yes [name], MD  amoxicillin-clavulanate (AUGMENTIN) 875-125 MG tablet Take 1 tablet by mouth 2 (two) times daily. Patient not taking: Reported on 03/02/2018 02/18/18   [name], MD     Vital Signs: BP 127/84   Temp 98.9 F (37.2 C) (Oral)   Resp 15   LMP 11/16/2012   SpO2 96%   Physical Exam  Constitutional: She is oriented to person, place, and time. She appears well-developed and well-nourished. No distress.  Pulmonary/Chest: Effort normal. No respiratory distress.  Genitourinary:  Genitourinary Comments: Right nephrostomy tube site c/d/i with moderate tenderness of incision site, approximately 550 mL tea-colored urine in collection bag.  Neurological: She is alert and oriented to person, place, and time.  Skin: Skin is warm and dry.  Psychiatric: She has a normal mood and affect. Her behavior is normal. Judgment and thought content normal.  Nursing note and vitals reviewed.   Imaging: Ct Abdomen Pelvis W Wo Contrast  Result Date: 03/03/2018 CLINICAL DATA:  Persistent right flank pain and progressive anemia status post right percutaneous nephrostomy. New diagnosis of cervical cancer. EXAM: CT ABDOMEN AND PELVIS WITHOUT AND WITH CONTRAST TECHNIQUE: Multidetector CT imaging of the abdomen and pelvis was performed following the standard protocol before and following the bolus administration of intravenous contrast. CONTRAST:  179mL ISOVUE-300 IOPAMIDOL (ISOVUE-300) INJECTION 61% COMPARISON:  CT 02/16/2018. FINDINGS: Lower chest: New trace bilateral pleural effusions with dependent airspace opacities at both lung bases, likely atelectasis. Hepatobiliary: Pre contrast images demonstrate decreased hepatic density consistent with steatosis. There is stable ill-defined low-density in segment for the  liver, likely focal fat. No new or enlarging hepatic  lesions. No evidence of gallstones, gallbladder wall thickening or biliary dilatation. Pancreas: Unremarkable. No pancreatic ductal dilatation or surrounding inflammatory changes. Spleen: Normal in size without focal abnormality. Adrenals/Urinary Tract: Both adrenal glands appear normal. The right percutaneous nephrostomy appears well positioned. Bilateral nephroureteral catheters are unchanged in position from recent radiographs. There is a small amount of air within the right renal collecting system. No evidence of urinary tract calculus. There is no perinephric hematoma. There is mild ureteral dilatation bilaterally. Complex fluid collection adjacent to the vaginal cuff has decreased in size, now measuring approximately 2.6 x 8.0 cm transverse on image 133/8. There is minimal residual air in this collection. Delayed post-contrast images demonstrate bilateral renal excretion, but no significant contrast in the ureters, bladder or the pelvic fluid collection. There is mild bladder wall thickening. Stomach/Bowel: No evidence of bowel wall thickening, distention or surrounding inflammatory change. The appendix appears normal. Vascular/Lymphatic: There are no enlarged abdominal or pelvic lymph nodes. Mild aortic and branch vessel atherosclerosis. Retroaortic left renal vein. Reproductive: Hysterectomy. No adnexal mass. As above, the complex pelvic fluid collection and surrounding inflammatory changes in the pelvis have improved. No new or enlarging collections. Other: Postsurgical changes in the anterior abdominal wall. No free air. Musculoskeletal: No acute or significant osseous findings. Stable lumbar spine degenerative changes. IMPRESSION: 1. Interval improvement in complex pelvic fluid collection and surrounding inflammatory changes status post bilateral nephroureteral catheter and right percutaneous nephrostomy placement. 2. No evidence of perinephric hematoma or other signs of acute bleeding. 3. Mild bilateral  hydroureter, improved from preoperative CT. There is bilateral excretion of contrast into the renal collecting systems, but no significant opacification of the ureters or bladder. 4. New dependent opacities at both lung bases, likely atelectasis. Electronically Signed   By: [name]   On: 03/03/2018 13:03   Dg Chest 1 View  Result Date: 03/02/2018 CLINICAL DATA:  Chest pain EXAM: CHEST  1 VIEW COMPARISON:  02/11/2018 FINDINGS: Low lung volumes. Patchy atelectasis at the left base. No pleural effusion. Stable cardiomediastinal silhouette with minimal central congestion. No pneumothorax. IMPRESSION: Low lung volumes with minimal central congestion and patchy atelectasis at the left base Electronically Signed   By: [name]   On: 03/02/2018 19:59   Dg Abd 1 View  Result Date: 03/03/2018 CLINICAL DATA:  Right-sided abdominal pain. Right-sided nephrostomy tube placement yesterday. EXAM: ABDOMEN - 1 VIEW COMPARISON:  CT 02/16/2018. FINDINGS: Bilateral nephroureteral stents in place. Additionally right-sided nephrostomy tube in place. Normal bowel gas pattern. No bowel dilatation to suggest obstruction. Moderate stool in the ascending, distal transverse and descending colon. No evidence of free air on supine views. IMPRESSION: Bilateral ureteral stents in place. Right nephrostomy tube in place. Normal bowel gas pattern. Electronically Signed   By: [name]   On: 03/03/2018 04:10   Dg Chest Port 1 View  Result Date: 03/04/2018 CLINICAL DATA:  Sepsis. EXAM: PORTABLE CHEST 1 VIEW COMPARISON:  03/02/2018. FINDINGS: Poor inspiration. Normal sized heart. Mildly prominent interstitial markings. Mild patchy density in both lower lung zones. Unremarkable bones. IMPRESSION: 1. Poor inspiration with mild patchy atelectasis or pneumonia at both lung bases with minimal progression. 2. Mild chronic interstitial lung disease. Electronically Signed   By: Claudie Revering M.D.   On: 03/04/2018  08:54   Ir Nephrostomy Placement Right  Result Date: 03/02/2018 CLINICAL DATA:  Cervical carcinoma with postoperative uretero-vaginal fistulas and visualization of right ureteral  stent in the vagina. Right percutaneous nephrostomy tube requested for urinary diversion. There is extravasation at the time of retrograde studies at the level of the distal left ureter as well. Bilateral ureteral stents are currently in place. EXAM: 1. ULTRASOUND GUIDANCE FOR PUNCTURE OF THE RIGHT RENAL COLLECTING SYSTEM. 2. RIGHT PERCUTANEOUS NEPHROSTOMY TUBE PLACEMENT. COMPARISON:  CT of the abdomen and pelvis on 02/16/2018 ANESTHESIA/SEDATION: 3.0 mg IV Versed; 100 mcg IV Fentanyl. Total Moderate Sedation Time 23 minutes. The patient's level of consciousness and physiologic status were continuously monitored during the procedure by Radiology nursing. CONTRAST:  20 mL Isovue-300 MEDICATIONS: 2 g IV Ancef. Antibiotic was administered in an appropriate time frame prior to skin puncture. FLUOROSCOPY TIME:  1 minutes and 18 seconds.  16.6 mGy. PROCEDURE: The procedure, risks, benefits, and alternatives were explained to the patient. Questions regarding the procedure were encouraged and answered. The patient understands and consents to the procedure. A time-out was performed prior to initiating the procedure. Both kidneys were inspected by ultrasound. The right flank region was prepped with chlorhexidine in a sterile fashion, and a sterile drape was applied covering the operative field. A sterile gown and sterile gloves were used for the procedure. Local anesthesia was provided with 1% Lidocaine. Ultrasound was used to localize the right kidney. Under direct ultrasound guidance, a 21 gauge needle was advanced into the renal collecting system. Ultrasound image documentation was performed. Aspiration of urine sample was performed followed by contrast injection. A transitional dilator was advanced over a guidewire. Percutaneous tract  dilatation was then performed over the guidewire. A 10-French percutaneous nephrostomy tube was then advanced and formed in the collecting system. Catheter position was confirmed by fluoroscopy after contrast injection. The catheter was attached to a gravity bag and secured at the skin with a Prolene retention suture and Stat-Lock device. COMPLICATIONS: None FINDINGS: Ultrasound demonstrates mild to moderate right-sided hydronephrosis. There is no significant left hydronephrosis. A 10 French nephrostomy tube was placed on the right and formed at the level of the renal pelvis. IMPRESSION: Right-sided percutaneous nephrostomy tube placement. A 10 French catheter was placed and formed in the renal pelvis. This will be left to gravity bag drainage. Ultrasound demonstrates no evidence of left-sided hydronephrosis currently. Attempt at placing a left percutaneous nephrostomy tube was deferred today. Left nephrostomy tube placement would be very difficult without some degree of hydronephrosis. Electronically Signed   By: Aletta Edouard M.D.   On: 03/02/2018 15:36    Labs:  CBC: Recent Labs    03/03/18 0315 03/03/18 1033 03/03/18 1335 03/04/18 0326  WBC 15.3* 14.6* 14.2* 10.3  HGB 7.3* 7.5* 8.1* 7.3*  HCT 22.9* 23.1* 24.9* 22.4*  PLT 267 257 285 266    COAGS: Recent Labs    03/02/18 0952  INR 0.90    BMP: Recent Labs    03/02/18 0952 03/02/18 1641 03/03/18 0315 03/04/18 0326  NA 141 139 138 140  K 3.6 3.0* 3.3* 3.3*  CL 105 106 110 110  CO2 28 26 24 26   GLUCOSE 103* 115* 146* 100*  BUN 14 13 11 6   CALCIUM 9.2 8.2* 7.3* 7.8*  CREATININE 0.67 0.53 0.85 0.52  GFRNONAA >60 >60 >60 >60  GFRAA >60 >60 >60 >60    LIVER FUNCTION TESTS: Recent Labs    02/11/18 1818 02/16/18 1212 02/17/18 0352 03/02/18 1641  BILITOT 1.0 0.5 0.4 0.9  AST 28 79* 54* 40  ALT 26 197* 150* 27  ALKPHOS 65 306* 244* 178*  PROT  6.7 7.7 7.3 6.2*  ALBUMIN 3.1* 3.1* 3.2* 2.9*    Assessment and  Plan:  Right nephrostomy tube placed 03/02/2018 by Dr. Kathlene Cote. Right nephrostomy tube stable with moderate tenderness, approximately 550 mL tea-colored urine in collection bag. No evidence of bleed on CT A/P from yesterday. IR to follow.   Electronically Signed: Earley Abide, [name] 03/04/2018, 10:17 AM   I spent a total of 15 Minutes at the the patient's bedside AND on the patient's hospital floor or unit, greater than 50% of which was counseling/coordinating care for right nephrostomy tube.

## 2018-03-05 ENCOUNTER — Inpatient Hospital Stay: Payer: Medicaid Other | Admitting: Gynecologic Oncology

## 2018-03-05 DIAGNOSIS — C531 Malignant neoplasm of exocervix: Secondary | ICD-10-CM

## 2018-03-05 LAB — BASIC METABOLIC PANEL
ANION GAP: 9 (ref 5–15)
BUN: 5 mg/dL — ABNORMAL LOW (ref 6–20)
CALCIUM: 8.4 mg/dL — AB (ref 8.9–10.3)
CO2: 28 mmol/L (ref 22–32)
Chloride: 105 mmol/L (ref 98–111)
Creatinine, Ser: 0.53 mg/dL (ref 0.44–1.00)
Glucose, Bld: 86 mg/dL (ref 70–99)
POTASSIUM: 3.4 mmol/L — AB (ref 3.5–5.1)
SODIUM: 142 mmol/L (ref 135–145)

## 2018-03-05 LAB — CBC WITH DIFFERENTIAL/PLATELET
BASOS ABS: 0 10*3/uL (ref 0.0–0.1)
BASOS PCT: 0 %
EOS PCT: 2 %
Eosinophils Absolute: 0.2 10*3/uL (ref 0.0–0.7)
HCT: 25.5 % — ABNORMAL LOW (ref 36.0–46.0)
Hemoglobin: 8.6 g/dL — ABNORMAL LOW (ref 12.0–15.0)
LYMPHS PCT: 13 %
Lymphs Abs: 1 10*3/uL (ref 0.7–4.0)
MCH: 30.2 pg (ref 26.0–34.0)
MCHC: 33.7 g/dL (ref 30.0–36.0)
MCV: 89.5 fL (ref 78.0–100.0)
Monocytes Absolute: 0.5 10*3/uL (ref 0.1–1.0)
Monocytes Relative: 6 %
NEUTROS ABS: 6.4 10*3/uL (ref 1.7–7.7)
Neutrophils Relative %: 79 %
PLATELETS: 300 10*3/uL (ref 150–400)
RBC: 2.85 MIL/uL — AB (ref 3.87–5.11)
RDW: 13.4 % (ref 11.5–15.5)
WBC: 8.1 10*3/uL (ref 4.0–10.5)

## 2018-03-05 LAB — GLUCOSE, CAPILLARY
GLUCOSE-CAPILLARY: 71 mg/dL (ref 70–99)
GLUCOSE-CAPILLARY: 89 mg/dL (ref 70–99)
Glucose-Capillary: 146 mg/dL — ABNORMAL HIGH (ref 70–99)
Glucose-Capillary: 104 mg/dL — ABNORMAL HIGH (ref 70–99)

## 2018-03-05 MED ORDER — METOCLOPRAMIDE HCL 5 MG/ML IJ SOLN: 10.0000 mg | Freq: Three times a day (TID) | INTRAMUSCULAR | Status: DC | PRN

## 2018-03-05 NOTE — Evaluation (Signed)
Physical Therapy Evaluation Patient Details Name: Amy Morrow MRN: [id number] DOB: Oct 04, 1960 Today's Date: 03/05/2018   History of Present Illness  57 y.o. 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 Pt currently with functional limitations due to the deficits listed below (see PT Problem List). Pt will benefit from skilled PT to increase their independence and safety with mobility to allow discharge to the venue listed below.  Pt assisted with ambulating, distance limited by fatigue.  Pt would benefit from RW upon d/c home.  Pt encouraged to ambulate with staff during acute stay.  No f/u PT needs upon d/c anticipated.   (Interpreter present for evaluation.)     Follow Up Recommendations No PT follow up    Equipment Recommendations  Rolling walker with 5" wheels    Recommendations for Other Services       Precautions / Restrictions Precautions Precautions: Fall      Mobility  Bed Mobility Overal bed mobility: Needs Assistance Bed Mobility: Supine to Sit     Supine to sit: Min assist     General bed mobility comments: assist for scooting to EOB, assist for lines  Transfers Overall transfer level: Needs assistance Equipment used: 1 person hand held assist Transfers: Sit to/from Stand Sit to Stand: Min guard         General transfer comment: verbal cues for safety  Ambulation/Gait Ambulation/Gait assistance: Min guard Gait Distance (Feet): 90 Feet Assistive device: Rolling walker (2 wheeled) Gait Pattern/deviations: Step-through pattern;Decreased stride length Gait velocity: decr   General Gait Details: very slow speed, initially pt just pushing IV pole however requested RW for better  stability, improved gait with RW  Stairs            Wheelchair Mobility    Modified Rankin (Stroke Patients Only)       Balance                                             Pertinent Vitals/Pain Pain Assessment: Faces Faces Pain Scale: Hurts a little bit Pain Location: denies pain with mobility however facial grimacing present at times    Home Living Family/patient expects to be discharged to:: Private residence Living Arrangements: Spouse/significant other Available Help at Discharge: Family Type of Home: House       Home Layout: One level Home Equipment: None      Prior Function Level of Independence: Independent               Hand Dominance        Extremity/Trunk Assessment        Lower Extremity Assessment Lower Extremity Assessment: Generalized weakness       Communication   Communication: Prefers language other than English;Interpreter utilized(Spanish)  Cognition Arousal/Alertness: Awake/alert Behavior During Therapy: WFL for tasks assessed/performed Overall Cognitive Status: Within Functional Limits for tasks assessed                                        General Comments      Exercises     Assessment/Plan  PT Assessment Patient needs continued PT services  PT Problem List Decreased strength;Decreased mobility;Decreased activity tolerance;Decreased knowledge of use of DME       PT Treatment Interventions DME instruction;Therapeutic activities;Gait training;Therapeutic exercise;Patient/family education;Functional mobility training    PT Goals (Current goals can be found in the Care Plan section)  Acute Rehab PT Goals PT Goal Formulation: With patient Time For Goal Achievement: 03/12/18 Potential to Achieve Goals: Good    Frequency Min 3X/week   Barriers to discharge        Co-evaluation               AM-PAC PT "6 Clicks" Daily Activity  Outcome Measure Difficulty  turning over in bed (including adjusting bedclothes, sheets and blankets)?: A Little Difficulty moving from lying on back to sitting on the side of the bed? : A Lot Difficulty sitting down on and standing up from a chair with arms (e.g., wheelchair, bedside commode, etc,.)?: A Lot Help needed moving to and from a bed to chair (including a wheelchair)?: A Little Help needed walking in hospital room?: A Little Help needed climbing 3-5 steps with a railing? : A Little 6 Click Score: 16    End of Session Equipment Utilized During Treatment: Gait belt Activity Tolerance: Patient tolerated treatment well Patient left: Other (comment)(with nursing in w/c to transfer to floor)   PT Visit Diagnosis: Other abnormalities of gait and mobility (R26.89)    Time: 8366-2947 PT Time Calculation (min) (ACUTE ONLY): 22 min   Charges:   PT Evaluation $PT Eval Low Complexity: 1 Low        Carmelia Bake, PT, DPT 03/05/2018 Pager: [phone]   York Ram E 03/05/2018, 12:21 PM

## 2018-03-05 NOTE — Progress Notes (Signed)
PROGRESS NOTE    Amy Morrow  TIW:[id number] DOB: 27-Jul-1961 DOA: 03/02/2018 PCP: Patient, No Pcp Per   Brief Narrative: Amy Morrow is a 57 y.o. 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  She was being monitored on short stay when she became febrile, tachycardic and hypotensive.  Hospitalist service was requested for admission. She follows with Dr. Roanna Raider oncology.  She was diagnosed with cervical squamous cell carcinoma on 6/19.She underwent radical hysterectomy with pelvic lymphadenectomy with  subsequent bilateral  ureterovaginal fistula.  She was recommended percutaneous nephrostomy tube placement to assist in decreasing urine flow through the vagina and she had this procedure done here today.  She also has bilateral ureteral stents put by urology earlier for bilateral ureterovaginal fistulas.  Currently she is being managed for sepsis.  Blood cultures have been negative so far.  Hemodynamically stable.  Plan for discharge to home tomorrow.  Assessment & Plan:   Principal Problem:   Sepsis ([place]) Active Problems:   Cervical cancer ([place])   Ureterovaginal fistula   Anemia   Hypotension  Sepsis/Septic shock:.BP stable now. Status post nephrostomy tube placement.PCCM were  following. Will stop fluids.  Blood cultures, urine cultures have been sent.NGTD She had elevated  procalcitonin.  Lactic acid level elevated but has normalized.On broad-spectrum antibiotics with vancomycin and Zosyn. Check Xray didnot show PNA. We will change antibiotics to oral tomorrow.  Anemia: Acute blood loss anemia likely  secondary to nephrostomy tube placement.  We will continue to monitor H&H.   CT abdomen/pelvis   did not show retroperitoneal bleed.  Cervical cancer: She  follows with Dr. Roanna Raider oncology.  She was diagnosed with cervical squamous cell carcinoma on 6/19.She underwent radical hysterectomy with pelvic lymphadenectomy.There  was no clinical involvement of the parametrium and no suspicious nodes.She subsequent developed bilateral  ureterovaginal fistula,more on right.   She is being worked up as an outpatient for incidentally found  thyroid nodule on PET scan .  Bilateral ureterovaginal fistula: She was recommended right percutaneous nephrostomy tube placement to assist in decreasing urine flow through the vagina and she had this procedure done here . She is still having a lot of drainage from vagina, most likely urine from ureterovaginal fistula.  She also had bilateral ureteral stents put by urology earlier .  She will follow-up with Dr. Louis Meckel in 4 to 6-week for reassessment of the fistula and for possible further surgical intervention.    DVT prophylaxis: SCD Code Status: Full Family Communication: None at the bedside today Disposition Plan:DC to home tomorrow.  We will transfer her to [place] today   Consultants: PCCM  Procedures: Right-sided nephrostomy tube placement  Antimicrobials: Vancomycin and Zosyn day 4  Subjective: Patient seen and examined the bedside this morning.  BP stable this morning.  Afebrile.remains comfortable.  No new issues.  Has not had a bowel movement since admission but denies any abdominal pain or discomfort.  Objective: Vitals:   03/05/18 0400 03/05/18 0500 03/05/18 0600 03/05/18 0800  BP: 126/63 125/62 124/62 124/65  Pulse:      Resp: 12 13 13 13   Temp:      TempSrc:      SpO2: 98% 96% 97% 94%  Weight:      Height:        Intake/Output Summary (Last 24 hours) at 03/05/2018 1149  Last data filed at 03/05/2018 0600 Gross per 24 hour  Intake 1198.21 ml  Output 3275 ml  Net -2076.79 ml   Filed Weights   03/04/18 1950  Weight: 66.2 kg (145 lb 15.1 oz)    Examination:  General exam: Appears calm  and comfortable ,Not in distress,average built HEENT:PERRL,Oral mucosa moist, Ear/Nose normal on gross exam Respiratory system: Bilateral equal air entry, normal vesicular breath sounds, no wheezes or crackles  Cardiovascular system: S1 & S2 heard, RRR. No JVD, murmurs, rubs, gallops or clicks. No pedal edema. Gastrointestinal system: Abdomen is nondistended, soft and nontender. No organomegaly or masses felt. Normal bowel sounds heard. Right-sided nephrostomy tube with clean urine in the bag Central nervous system: Alert and oriented. No focal neurological deficits. Extremities: No edema, no clubbing ,no cyanosis, distal peripheral pulses palpable. Skin: No rashes, lesions or ulcers,no icterus ,no pallor MSK: Normal muscle bulk,tone ,power Psychiatry: Judgement and insight appear normal. Mood & affect appropriate.     Data Reviewed: I have personally reviewed following labs and imaging studies  CBC: Recent Labs  Lab 03/02/18 1641 03/03/18 0315 03/03/18 1033 03/03/18 1335 03/04/18 0326 03/05/18 0325  WBC 5.0 15.3* 14.6* 14.2* 10.3 8.1  NEUTROABS 4.8 14.8*  --  12.8* 8.7* 6.4  HGB 9.2* 7.3* 7.5* 8.1* 7.3* 8.6*  HCT 28.4* 22.9* 23.1* 24.9* 22.4* 25.5*  MCV 92.2 92.7 92.0 92.6 90.7 89.5  PLT 324 267 257 285 266 381   Basic Metabolic Panel: Recent Labs  Lab 03/02/18 0952 03/02/18 1641 03/03/18 0315 03/04/18 0326 03/05/18 0325  NA 141 139 138 140 142  K 3.6 3.0* 3.3* 3.3* 3.4*  CL 105 106 110 110 105  CO2 28 26 24 26 28   GLUCOSE 103* 115* 146* 100* 86  BUN 14 13 11 6  5*  CREATININE 0.67 0.53 0.85 0.52 0.53  CALCIUM 9.2 8.2* 7.3* 7.8* 8.4*  MG  --   --  1.7 2.1  --    GFR: Estimated Creatinine Clearance: 65.7 mL/min (by C-G formula based on SCr of 0.53 mg/dL). Liver Function Tests: Recent Labs  Lab 03/02/18 1641  AST 40  ALT 27  ALKPHOS 178*  BILITOT 0.9  PROT 6.2*  ALBUMIN 2.9*   No results for input(s): LIPASE, AMYLASE in the last 168 hours. No results for  input(s): AMMONIA in the last 168 hours. Coagulation Profile: Recent Labs  Lab 03/02/18 0952  INR 0.90   Cardiac Enzymes: No results for input(s): CKTOTAL, CKMB, CKMBINDEX, TROPONINI in the last 168 hours. BNP (last 3 results) No results for input(s): PROBNP in the last 8760 hours. HbA1C: No results for input(s): HGBA1C in the last 72 hours. CBG: Recent Labs  Lab 03/04/18 0747 03/04/18 1142 03/04/18 1553 03/04/18 2132 03/05/18 0817  GLUCAP 88 106* 96 112* 71   Lipid Profile: No results for input(s): CHOL, HDL, LDLCALC, TRIG, CHOLHDL, LDLDIRECT in the last 72 hours. Thyroid Function Tests: No results for input(s): TSH, T4TOTAL, FREET4, T3FREE, THYROIDAB in the last 72 hours. Anemia Panel: No results for input(s): VITAMINB12, FOLATE, FERRITIN, TIBC, IRON, RETICCTPCT in the last 72 hours. Sepsis Labs: Recent Labs  Lab 03/02/18 1702 03/03/18 1033 03/04/18 0326  PROCALCITON 16.97  --   --   LATICACIDVEN 1.5 2.8* 0.8    Recent Results (from the past 240 hour(s))  Urine Culture     Status: Abnormal   Collection Time: 03/02/18  3:57 PM  Result Value Ref Range Status   Specimen Description   Final  KIDNEY RIGHT Performed at [facility], [place] [address], [place], [place] [address]    Special Requests   Final    Normal Performed at [facility], [place] [address] [place], [place] [address]    Culture (A)  Final    <10,000 COLONIES/mL INSIGNIFICANT GROWTH Performed at [facility] [address] [place], [place] [address]    Report Status 03/04/2018 FINAL  Final  Culture, blood (routine x 2)     Status: None (Preliminary result)   Collection Time: 03/02/18  5:02 PM  Result Value Ref Range Status   Specimen Description   Final    BLOOD LEFT ANTECUBITAL Performed at [facility] [address], [place], [place] [address]    Special Requests   Final    BOTTLES DRAWN AEROBIC AND ANAEROBIC Blood Culture  adequate volume Performed at [facility] [address] [place], [place] [address]    Culture   Final    NO GROWTH 3 DAYS Performed at [facility], [place] [address], [place], [place] [address]    Report Status PENDING  Incomplete  Culture, blood (routine x 2)     Status: None (Preliminary result)   Collection Time: 03/02/18  5:33 PM  Result Value Ref Range Status   Specimen Description   Final    BLOOD LEFT HAND Performed at [facility] [address], [place], [place] [address]    Special Requests   Final    BOTTLES DRAWN AEROBIC AND ANAEROBIC Blood Culture results may not be optimal due to an inadequate volume of blood received in culture bottles Performed at [facility] [address], [place], [place] [address]    Culture   Final    NO GROWTH 3 DAYS Performed at [facility], [place] [address], [place], [place] [address]    Report Status PENDING  Incomplete  MRSA PCR Screening     Status: None   Collection Time: 03/02/18  6:30 PM  Result Value Ref Range Status   MRSA by PCR NEGATIVE NEGATIVE Final    Comment:        The GeneXpert MRSA Assay (FDA approved for NASAL specimens only), is one component of a comprehensive MRSA colonization surveillance program. It is not intended to diagnose MRSA infection nor to guide or monitor treatment for MRSA infections. Performed at [facility], [place] [address] [place], [place] [address]   Culture, blood (routine x 2)     Status: None (Preliminary result)   Collection Time: 03/03/18  7:54 AM  Result Value Ref Range Status   Specimen Description   Final    BLOOD LEFT ARM Performed at [facility] [address] [place], [place] [address]    Special Requests   Final    BOTTLES DRAWN AEROBIC AND ANAEROBIC Blood Culture adequate volume Performed at [facility] [address], [place], [place]  [address]    Culture   Final    NO GROWTH 2 DAYS Performed at [facility] [address] [place],  [address]    Report Status PENDING  Incomplete  Culture, blood (routine x 2)     Status: None (Preliminary result)   Collection Time: 03/03/18  7:54 AM  Result Value Ref Range Status   Specimen Description   Final    BLOOD LEFT HAND Performed at [facility], [address] [place], [place] [address]    Special Requests   Final    BOTTLES DRAWN AEROBIC ONLY Blood Culture adequate volume Performed at [facility] [address] [place], [place] [address]    Culture   Final    NO GROWTH 2 DAYS Performed at [facility] [address], [place], [place] [address]    Report Status PENDING  Incomplete  Culture, Urine     Status: None   Collection Time: 03/03/18 11:26 AM  Result Value Ref Range Status   Specimen Description   Final    URINE, RANDOM URINE BAG Performed at [facility] [address] [place], [place] [address]    Special Requests   Final    NONE Performed at [facility], [place] [address] [place], [place] [address]    Culture   Final    NO GROWTH Performed at [facility], [place] [address], [place], [place] [address]    Report Status 03/04/2018 FINAL  Final         Radiology Studies: Ct Abdomen Pelvis W Wo Contrast  Result Date: 03/03/2018 CLINICAL DATA:  Persistent right flank pain and progressive anemia status post right percutaneous nephrostomy. New diagnosis of cervical cancer. EXAM: CT ABDOMEN AND PELVIS WITHOUT AND WITH CONTRAST TECHNIQUE: Multidetector CT imaging of the abdomen and pelvis was performed following the standard protocol before and following the bolus administration of intravenous contrast. CONTRAST:  160mL ISOVUE-300 IOPAMIDOL (ISOVUE-300) INJECTION 61% COMPARISON:  CT 02/16/2018. FINDINGS: Lower chest: New trace bilateral pleural effusions  with dependent airspace opacities at both lung bases, likely atelectasis. Hepatobiliary: Pre contrast images demonstrate decreased hepatic density consistent with steatosis. There is stable ill-defined low-density in segment for the liver, likely focal fat. No new or enlarging hepatic lesions. No evidence of gallstones, gallbladder wall thickening or biliary dilatation. Pancreas: Unremarkable. No pancreatic ductal dilatation or surrounding inflammatory changes. Spleen: Normal in size without focal abnormality. Adrenals/Urinary Tract: Both adrenal glands appear normal. The right percutaneous nephrostomy appears well positioned. Bilateral nephroureteral catheters are unchanged in position from recent radiographs. There is a small amount of air within the right renal collecting system. No evidence of urinary tract calculus. There is no perinephric hematoma. There is mild ureteral dilatation bilaterally. Complex fluid collection adjacent to the vaginal cuff has decreased in size, now measuring approximately 2.6 x 8.0 cm transverse on image 133/8. There is minimal residual air in this collection. Delayed post-contrast images demonstrate bilateral renal excretion, but no significant contrast in the ureters, bladder or the pelvic fluid collection. There is mild bladder wall thickening. Stomach/Bowel: No evidence of bowel wall thickening, distention or surrounding inflammatory change. The appendix appears normal. Vascular/Lymphatic: There are no enlarged abdominal or pelvic lymph nodes. Mild aortic and branch vessel atherosclerosis. Retroaortic left renal vein. Reproductive: Hysterectomy. No adnexal mass. As above, the complex pelvic fluid collection and surrounding inflammatory changes in the pelvis have improved. No new or enlarging collections. Other: Postsurgical changes in the anterior abdominal wall. No free air. Musculoskeletal: No acute or significant osseous findings. Stable lumbar spine degenerative changes.  IMPRESSION: 1. Interval improvement in complex pelvic fluid collection and surrounding inflammatory changes status post bilateral nephroureteral catheter and right percutaneous nephrostomy placement. 2. No evidence of perinephric hematoma or other signs of acute bleeding. 3. Mild bilateral hydroureter, improved from preoperative CT. There is bilateral excretion of contrast into the renal collecting systems, but no significant opacification of the ureters  or bladder. 4. New dependent opacities at both lung bases, likely atelectasis. Electronically Signed   By: [name]   On: 03/03/2018 13:03   Dg Chest Port 1 View  Result Date: 03/04/2018 CLINICAL DATA:  Sepsis. EXAM: PORTABLE CHEST 1 VIEW COMPARISON:  03/02/2018. FINDINGS: Poor inspiration. Normal sized heart. Mildly prominent interstitial markings. Mild patchy density in both lower lung zones. Unremarkable bones. IMPRESSION: 1. Poor inspiration with mild patchy atelectasis or pneumonia at both lung bases with minimal progression. 2. Mild chronic interstitial lung disease. Electronically Signed   By: Claudie Revering M.D.   On: 03/04/2018 08:54        Scheduled Meds: . insulin aspart  0-5 Units Subcutaneous QHS  . insulin aspart  0-9 Units Subcutaneous TID WC  . polyethylene glycol  17 g Oral Daily   Continuous Infusions: . sodium chloride Stopped (03/04/18 2047)  . sodium chloride Stopped (03/03/18 1025)  . famotidine (PEPCID) IV 20 mg (03/05/18 1044)  . piperacillin-tazobactam (ZOSYN)  IV Stopped (03/05/18 0948)  . sodium chloride    . vancomycin Stopped (03/04/18 2140)     LOS: 3 days    Time spent:25 mins. More than 50% of that time was spent in counseling and/or coordination of care.      Shelly Coss, MD [facility] Pager [phone]  If 7PM-7AM, please contact night-coverage [email or web address] Password [email or web address] 03/05/2018, 11:49 AM

## 2018-03-05 NOTE — Progress Notes (Signed)
Referring Physician(s): Morrow,Amy  Supervising Physician: Amy Morrow  Patient Status:  [name] - In-pt  Chief Complaint:  Right flank pain  Subjective: Pt feeling a little better today; sitting up in chair, eating lunch; denies N/V; still has some rt flank discomfort but not severe, vaginal leakage of urine has diminished per pt   Allergies: Patient has no known allergies.  Medications: Prior to Admission medications   Medication Sig Start Date End Date Taking? Authorizing Provider  acetaminophen (TYLENOL) 500 MG tablet Take 500 mg by mouth every 6 (six) hours as needed for fever.   Yes [provider]  docusate sodium (COLACE) 100 MG capsule Take 1 capsule (100 mg total) by mouth 2 (two) times daily. 02/09/18  Yes [name], MD  metFORMIN (GLUCOPHAGE XR) 500 MG 24 hr tablet Take 1 tablet (500 mg total) by mouth daily with breakfast. 02/23/18  Yes [name], [name]  polyethylene glycol [facility] / Floria Raveling) packet Take 17 g by mouth daily. 02/18/18  Yes [name], MD  senna (SENOKOT) 8.6 MG TABS tablet Take 1 tablet (8.6 mg total) by mouth at bedtime. 02/18/18  Yes [name], MD  amoxicillin-clavulanate (AUGMENTIN) 875-125 MG tablet Take 1 tablet by mouth 2 (two) times daily. Patient not taking: Reported on 03/02/2018 02/18/18   [name], MD     Vital Signs: BP 124/65   Pulse 84   Temp 98.8 F (37.1 C) (Oral)   Resp 13   Ht 4\' 11"  (1.499 m)   Wt 145 lb 15.1 oz (66.2 kg)   LMP 11/16/2012   SpO2 94%   BMI 29.48 kg/m   Physical Exam rt PCN intact, dressing dry, site mild- mod tender, output 2.3 liters yesterday, 500 + cc amber urine today  Imaging: Ct Abdomen Pelvis W Wo Contrast  Result Date: 03/03/2018 CLINICAL DATA:  Persistent right flank pain and progressive anemia status post right percutaneous nephrostomy. New diagnosis of cervical cancer. EXAM: CT ABDOMEN AND PELVIS WITHOUT AND WITH CONTRAST TECHNIQUE: Multidetector CT imaging of the  abdomen and pelvis was performed following the standard protocol before and following the bolus administration of intravenous contrast. CONTRAST:  186mL ISOVUE-300 IOPAMIDOL (ISOVUE-300) INJECTION 61% COMPARISON:  CT 02/16/2018. FINDINGS: Lower chest: New trace bilateral pleural effusions with dependent airspace opacities at both lung bases, likely atelectasis. Hepatobiliary: Pre contrast images demonstrate decreased hepatic density consistent with steatosis. There is stable ill-defined low-density in segment for the liver, likely focal fat. No new or enlarging hepatic lesions. No evidence of gallstones, gallbladder wall thickening or biliary dilatation. Pancreas: Unremarkable. No pancreatic ductal dilatation or surrounding inflammatory changes. Spleen: Normal in size without focal abnormality. Adrenals/Urinary Tract: Both adrenal glands appear normal. The right percutaneous nephrostomy appears well positioned. Bilateral nephroureteral catheters are unchanged in position from recent radiographs. There is a small amount of air within the right renal collecting system. No evidence of urinary tract calculus. There is no perinephric hematoma. There is mild ureteral dilatation bilaterally. Complex fluid collection adjacent to the vaginal cuff has decreased in size, now measuring approximately 2.6 x 8.0 cm transverse on image 133/8. There is minimal residual air in this collection. Delayed post-contrast images demonstrate bilateral renal excretion, but no significant contrast in the ureters, bladder or the pelvic fluid collection. There is mild bladder wall thickening. Stomach/Bowel: No evidence of bowel wall thickening, distention or surrounding inflammatory change. The appendix appears normal. Vascular/Lymphatic: There are no enlarged abdominal or pelvic lymph nodes. Mild aortic and branch vessel  atherosclerosis. Retroaortic left renal vein. Reproductive: Hysterectomy. No adnexal mass. As above, the complex pelvic fluid  collection and surrounding inflammatory changes in the pelvis have improved. No new or enlarging collections. Other: Postsurgical changes in the anterior abdominal wall. No free air. Musculoskeletal: No acute or significant osseous findings. Stable lumbar spine degenerative changes. IMPRESSION: 1. Interval improvement in complex pelvic fluid collection and surrounding inflammatory changes status post bilateral nephroureteral catheter and right percutaneous nephrostomy placement. 2. No evidence of perinephric hematoma or other signs of acute bleeding. 3. Mild bilateral hydroureter, improved from preoperative CT. There is bilateral excretion of contrast into the renal collecting systems, but no significant opacification of the ureters or bladder. 4. New dependent opacities at both lung bases, likely atelectasis. Electronically Signed   By: [name]   On: 03/03/2018 13:03   Dg Chest 1 View  Result Date: 03/02/2018 CLINICAL DATA:  Chest pain EXAM: CHEST  1 VIEW COMPARISON:  02/11/2018 FINDINGS: Low lung volumes. Patchy atelectasis at the left base. No pleural effusion. Stable cardiomediastinal silhouette with minimal central congestion. No pneumothorax. IMPRESSION: Low lung volumes with minimal central congestion and patchy atelectasis at the left base Electronically Signed   By: [name]   On: 03/02/2018 19:59   Dg Abd 1 View  Result Date: 03/03/2018 CLINICAL DATA:  Right-sided abdominal pain. Right-sided nephrostomy tube placement yesterday. EXAM: ABDOMEN - 1 VIEW COMPARISON:  CT 02/16/2018. FINDINGS: Bilateral nephroureteral stents in place. Additionally right-sided nephrostomy tube in place. Normal bowel gas pattern. No bowel dilatation to suggest obstruction. Moderate stool in the ascending, distal transverse and descending colon. No evidence of free air on supine views. IMPRESSION: Bilateral ureteral stents in place. Right nephrostomy tube in place. Normal bowel gas pattern.  Electronically Signed   By: [name]   On: 03/03/2018 04:10   Dg Chest Port 1 View  Result Date: 03/04/2018 CLINICAL DATA:  Sepsis. EXAM: PORTABLE CHEST 1 VIEW COMPARISON:  03/02/2018. FINDINGS: Poor inspiration. Normal sized heart. Mildly prominent interstitial markings. Mild patchy density in both lower lung zones. Unremarkable bones. IMPRESSION: 1. Poor inspiration with mild patchy atelectasis or pneumonia at both lung bases with minimal progression. 2. Mild chronic interstitial lung disease. Electronically Signed   By: Claudie Revering M.D.   On: 03/04/2018 08:54   Ir Nephrostomy Placement Right  Result Date: 03/02/2018 CLINICAL DATA:  Cervical carcinoma with postoperative uretero-vaginal fistulas and visualization of right ureteral stent in the vagina. Right percutaneous nephrostomy tube requested for urinary diversion. There is extravasation at the time of retrograde studies at the level of the distal left ureter as well. Bilateral ureteral stents are currently in place. EXAM: 1. ULTRASOUND GUIDANCE FOR PUNCTURE OF THE RIGHT RENAL COLLECTING SYSTEM. 2. RIGHT PERCUTANEOUS NEPHROSTOMY TUBE PLACEMENT. COMPARISON:  CT of the abdomen and pelvis on 02/16/2018 ANESTHESIA/SEDATION: 3.0 mg IV Versed; 100 mcg IV Fentanyl. Total Moderate Sedation Time 23 minutes. The patient's level of consciousness and physiologic status were continuously monitored during the procedure by Radiology nursing. CONTRAST:  20 mL Isovue-300 MEDICATIONS: 2 g IV Ancef. Antibiotic was administered in an appropriate time frame prior to skin puncture. FLUOROSCOPY TIME:  1 minutes and 18 seconds.  16.6 mGy. PROCEDURE: The procedure, risks, benefits, and alternatives were explained to the patient. Questions regarding the procedure were encouraged and answered. The patient understands and consents to the procedure. A time-out was performed prior to initiating the procedure. Both kidneys were inspected by ultrasound. The right flank  region was  prepped with chlorhexidine in a sterile fashion, and a sterile drape was applied covering the operative field. A sterile gown and sterile gloves were used for the procedure. Local anesthesia was provided with 1% Lidocaine. Ultrasound was used to localize the right kidney. Under direct ultrasound guidance, a 21 gauge needle was advanced into the renal collecting system. Ultrasound image documentation was performed. Aspiration of urine sample was performed followed by contrast injection. A transitional dilator was advanced over a guidewire. Percutaneous tract dilatation was then performed over the guidewire. A 10-French percutaneous nephrostomy tube was then advanced and formed in the collecting system. Catheter position was confirmed by fluoroscopy after contrast injection. The catheter was attached to a gravity bag and secured at the skin with a Prolene retention suture and Stat-Lock device. COMPLICATIONS: None FINDINGS: Ultrasound demonstrates mild to moderate right-sided hydronephrosis. There is no significant left hydronephrosis. A 10 French nephrostomy tube was placed on the right and formed at the level of the renal pelvis. IMPRESSION: Right-sided percutaneous nephrostomy tube placement. A 10 French catheter was placed and formed in the renal pelvis. This will be left to gravity bag drainage. Ultrasound demonstrates no evidence of left-sided hydronephrosis currently. Attempt at placing a left percutaneous nephrostomy tube was deferred today. Left nephrostomy tube placement would be very difficult without some degree of hydronephrosis. Electronically Signed   By: Aletta Edouard M.D.   On: 03/02/2018 15:36    Labs:  CBC: Recent Labs    03/03/18 1033 03/03/18 1335 03/04/18 0326 03/05/18 0325  WBC 14.6* 14.2* 10.3 8.1  HGB 7.5* 8.1* 7.3* 8.6*  HCT 23.1* 24.9* 22.4* 25.5*  PLT 257 285 266 300    COAGS: Recent Labs    03/02/18 0952  INR 0.90    BMP: Recent Labs    03/02/18 1641  03/03/18 0315 03/04/18 0326 03/05/18 0325  NA 139 138 140 142  K 3.0* 3.3* 3.3* 3.4*  CL 106 110 110 105  CO2 26 24 26 28   GLUCOSE 115* 146* 100* 86  BUN 13 11 6  5*  CALCIUM 8.2* 7.3* 7.8* 8.4*  CREATININE 0.53 0.85 0.52 0.53  GFRNONAA >60 >60 >60 >60  GFRAA >60 >60 >60 >60    LIVER FUNCTION TESTS: Recent Labs    02/11/18 1818 02/16/18 1212 02/17/18 0352 03/02/18 1641  BILITOT 1.0 0.5 0.4 0.9  AST 28 79* 54* 40  ALT 26 197* 150* 27  ALKPHOS 65 306* 244* 178*  PROT 6.7 7.7 7.3 6.2*  ALBUMIN 3.1* 3.1* 3.2* 2.9*    Assessment and Plan: 57 y.o.femalewith history of squamous cell carcinoma of the cervix, status post robotic assisted radical hysterectomy with upper vaginectomy and pelvic lymphadenectomy on 02/08/2018. Patient subsequently developed bilateral ureterovaginal fistulas, right greater than left.She has bilateral ureteral stents in place. The right ureteral stent is visible in the vagina and patient now has leakage of urine from the vagina as well as some occasional vaginal bleeding.  She is status post diverting right percutaneous nephrostomy on 03/02/2018 with subsequent rigors, nausea, vomiting, fever, tachycardia and hypotension.  She was treated with fluid boluses, antiemetics, Ancef preprocedure and currently on Zosyn and vancomycin.  Of note patient had prior enterococcal UTI earlier this month and completed outpatient course of  Augmentin. Currently afebrile; WBC nl; creat nl; hgb 8.6(7.3), urine cx neg; blood cx ng to date; patient may require placement of diverting left PCN at some point following resolution of infection (she had no sig left hydro on exam recently).  Placement would  be very difficult without some degree of hydronephrosis however. Appreciate [facility] assistance with management; additional plans as per Drs. Morrow/Herrick      Electronically Signed: D. Rowe Robert, [name] 03/05/2018, 1:57 PM   I spent a total of 15 minutes at the the patient's  bedside AND on the patient's hospital floor or unit, greater than 50% of which was counseling/coordinating care for right nephrostomy    Patient ID: Amy Morrow, female   DOB: 1961-05-28, 57 y.o.   MRN: [id number]

## 2018-03-05 NOTE — Progress Notes (Signed)
Pharmacy Antibiotic Note  Amy Morrow is a 57 y.o. female admitted on 03/02/2018 with sepsis.  Pharmacy has been consulted for piperacillin/tazobactam + vancomycin dosing.  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 Nephrostomy tubes placed 7/30 by IR - pt become febrile, tachycardic, and hypotensive post-procedure and was admitted for sepsis.   Today, 03/05/18  Day 3 Antibiotics  WBC improved, Afebrile  Cultures negative to date form 7/30 and 7/31 cultures  Plan:  Continue Piperacillin/tazobactam 3.375 g IV q8h EI  Continue Vancomycin 1000 mg IV q24h; however, with MRSA PCR negative, labs and vitals now improved and stable and cultures no growth to date, would recommend d/c vancomycin  Goal AUC 400-500  Monitor cultures, renal function, and vancomycin levels once at steady state  Height: 4\' 11"  (149.9 cm) Weight: 145 lb 15.1 oz (66.2 kg) IBW/kg (Calculated) : 43.2  Temp (24hrs), Avg:98.7 F (37.1 C), Min:98.4 F (36.9 C), Max:98.8 F (37.1 C)  Recent Labs  Lab 03/02/18 0952 03/02/18 1641 03/02/18 1702 03/03/18 0315 03/03/18 1033 03/03/18 1335 03/04/18 0326 03/05/18 0325  WBC 6.3 5.0  --  15.3* 14.6* 14.2* 10.3 8.1  CREATININE 0.67 0.53  --  0.85  --   --  0.52 0.53  LATICACIDVEN  --   --  1.5  --  2.8*  --  0.8  --     Estimated Creatinine Clearance: 65.7 mL/min (by C-G formula based on SCr of 0.53 mg/dL).    No Known Allergies  Antimicrobials this admission: vancomycin 7/30 >>  Piperacillin/tazobactam 7/30 >>   Microbiology results: 7/30 BCx: ngtd 7/30 UCx: ngtd  7/30 MRSA PCR: negative 7/31 urine: ngf 7/31 blood: ngtd  Thank you for allowing pharmacy to be a part of this patient's care.  Kara Mead, PharmD, BCPS Clinical Pharmacist 03/05/2018 8:58 AM

## 2018-03-06 LAB — GLUCOSE, CAPILLARY
GLUCOSE-CAPILLARY: 87 mg/dL (ref 70–99)
GLUCOSE-CAPILLARY: 103 mg/dL — AB (ref 70–99)

## 2018-03-06 MED ORDER — LEVOFLOXACIN 500 MG PO TABS: 500.0000 mg | ORAL_TABLET | Freq: Every day | ORAL | 0 refills | Status: AC

## 2018-03-06 NOTE — Progress Notes (Signed)
Patient waiting for walker and IR.

## 2018-03-06 NOTE — Progress Notes (Signed)
Discharge instructions and medications discussed with patient and husband.  Prescription and AVS given to patient.  All questions answered.

## 2018-03-06 NOTE — Progress Notes (Signed)
This RN instructed husband & son on how to flush and maintain drain.  Husband verbalized understanding.  All questions answered.

## 2018-03-06 NOTE — Discharge Summary (Signed)
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 She was being monitored on short stay when she became febrile, tachycardic and hypotensive. Hospitalist service was requested for admission. She follows with Dr. Roanna Raider oncology. She was diagnosed with cervical squamous cell carcinoma on 6/19.She underwent radical hysterectomy with pelvic lymphadenectomy with subsequent bilateral ureterovaginal fistula. She was recommended percutaneous nephrostomy tube placement to assist in decreasing urine flow through the vagina and she had this procedure done here today. She also has bilateral ureteral stents put by urology earlier for bilateral ureterovaginal fistulas.  Currently she is being managed for sepsis.  Blood cultures have been negative so far.  Hemodynamically stable.  Plan for discharge to today.  Following problems were addressed during hospitalization:  Sepsis/Septic shock:.BP stable now. Status post nephrostomy tube placement.PCCM were  following.Will stop fluids. Blood cultures, urine cultures have been sent.NGTD She had elevated  procalcitonin. Lactic acid level elevated but has normalized.On broad-spectrum antibiotics with vancomycin and Zosyn. Check Xray didnot show PNA. We will change antibiotics to oral on discharge.  Anemia: Acute blood loss anemia likely  secondary to nephrostomy tube placement.  CT abdomen/pelvis did  not show retroperitoneal bleed.Check CBC in a week.  Cervical cancer: She follows with Dr. Roanna Raider oncology. She was diagnosed with cervical squamous cell carcinoma on 6/19.She underwent radical hysterectomy with pelvic lymphadenectomy.There was no clinical involvement of the parametrium and no suspicious nodes.She subsequent developed bilateral ureterovaginal fistula,more on right.  She is being worked up as an outpatient for incidentally found  thyroid nodule on PET scan .  Bilateral ureterovaginal fistula: She was recommended right percutaneous nephrostomy tube placement to assist in decreasing urine flow through the vagina and she had this procedure done here . She is still having a lot of drainage from vagina, most likely urine from ureterovaginal fistula. She also had bilateral ureteral stents put by urology earlier .  She will follow-up with Dr. Louis Meckel in 4 to 6-week for reassessment of the fistula and for possible further surgical intervention. She will also follow-up with IR as an outpatient.  Follow-up appointment will be arranged by IR.    Discharge Diagnoses:  Principal Problem:   Sepsis ([place]) Active Problems:   Cervical cancer ([place])   Ureterovaginal fistula   Anemia   Hypotension    Discharge Instructions  Discharge Instructions    Ambulatory referral to Urology   Complete by:  As directed    Follow up in 2 weeks   Diet - low sodium heart healthy   Complete by:  As directed    Discharge instructions   Complete by:  As directed    1) Follow up with a PCP as an outpatient in a week.Do CBC and BMP test during the follow up. 2) Follow up with your gynecologist and urology as an outpatient.  Name and number of the provider has been attached. 3)Follow up with interventional radiology as an outpatient for the nephrostomy drain.  You will be called by interventional radiology for that.Flush the drain once or twice a day when needed. Empty the  bag as needed. 4)Take  prescribed medications as instructed.   Increase activity slowly   Complete by:  As directed      Allergies as of 03/06/2018   No Known Allergies     Medication List    STOP taking these medications   amoxicillin-clavulanate 875-125 MG tablet Commonly known as:  AUGMENTIN     TAKE these medications   acetaminophen 500 MG tablet Commonly known as:  TYLENOL Take 500 mg by mouth every 6 (six) hours as needed for fever.   docusate sodium 100 MG capsule Commonly known as:  COLACE Take 1 capsule (100 mg total) by mouth 2 (two) times daily.   levofloxacin 500 MG tablet Commonly known as:  LEVAQUIN Take 1 tablet (500 mg total) by mouth daily for 3 days. Start taking on:  03/07/2018   metFORMIN 500 MG 24 hr tablet Commonly known as:  GLUCOPHAGE XR Take 1 tablet (500 mg total) by mouth daily with breakfast.   polyethylene glycol packet Commonly known as:  MIRALAX / GLYCOLAX Take 17 g by mouth daily.   senna 8.6 MG Tabs tablet Commonly known as:  SENOKOT Take 1 tablet (8.6 mg total) by mouth at bedtime.      Follow-up Information    [name], MD. Schedule an appointment as soon as possible for a visit in 2 week(s).   Specialty:  Gynecologic Oncology Contact information: [place] [address] [phone]        Ardis Hughs, MD. Schedule an appointment as soon as possible for a visit in 2 week(s).   Specialty:  Urology Contact information: [place] [phone] [phone]          No Known Allergies  Consultations: GYN, IR  Procedures/Studies: Ct Abdomen Pelvis W Wo Contrast  Result Date: 03/03/2018 CLINICAL DATA:  Persistent right flank pain and progressive anemia status post right percutaneous nephrostomy. New diagnosis of cervical cancer. EXAM: CT ABDOMEN AND PELVIS WITHOUT AND WITH CONTRAST TECHNIQUE: Multidetector CT imaging of the abdomen and pelvis was performed following the standard protocol before and  following the bolus administration of intravenous contrast. CONTRAST:  127mL ISOVUE-300 IOPAMIDOL (ISOVUE-300) INJECTION 61% COMPARISON:  CT 02/16/2018. FINDINGS: Lower chest: New trace bilateral pleural effusions with dependent airspace opacities at both lung bases, likely atelectasis. Hepatobiliary: Pre contrast images demonstrate decreased hepatic density consistent with steatosis. There is stable ill-defined low-density in segment for the liver, likely focal fat. No new or enlarging hepatic lesions. No evidence of gallstones, gallbladder wall thickening or biliary dilatation. Pancreas: Unremarkable. No pancreatic ductal dilatation or surrounding inflammatory changes. Spleen: Normal in size without focal abnormality. Adrenals/Urinary Tract: Both adrenal glands appear normal. The right percutaneous nephrostomy appears well positioned. Bilateral nephroureteral catheters are unchanged in position from recent radiographs. There is a small amount of air within the right renal collecting system. No evidence of urinary tract calculus. There is no perinephric hematoma. There is mild ureteral dilatation bilaterally. Complex fluid collection adjacent to the vaginal cuff has decreased in size, now measuring approximately 2.6 x 8.0 cm transverse on image 133/8. There is minimal residual air in this collection. Delayed post-contrast images demonstrate bilateral renal excretion, but no significant contrast in the ureters, bladder or the pelvic fluid collection. There is mild bladder wall thickening. Stomach/Bowel: No evidence of bowel wall thickening, distention or surrounding inflammatory change. The appendix appears normal. Vascular/Lymphatic: There are no enlarged abdominal or pelvic lymph nodes. Mild aortic and branch vessel atherosclerosis.  Retroaortic left renal vein. Reproductive: Hysterectomy. No adnexal mass. As above, the complex pelvic fluid collection and surrounding inflammatory changes in the pelvis have  improved. No new or enlarging collections. Other: Postsurgical changes in the anterior abdominal wall. No free air. Musculoskeletal: No acute or significant osseous findings. Stable lumbar spine degenerative changes. IMPRESSION: 1. Interval improvement in complex pelvic fluid collection and surrounding inflammatory changes status post bilateral nephroureteral catheter and right percutaneous nephrostomy placement. 2. No evidence of perinephric hematoma or other signs of acute bleeding. 3. Mild bilateral hydroureter, improved from preoperative CT. There is bilateral excretion of contrast into the renal collecting systems, but no significant opacification of the ureters or bladder. 4. New dependent opacities at both lung bases, likely atelectasis. Electronically Signed   By: [name]   On: 03/03/2018 13:03   Dg Chest 1 View  Result Date: 03/02/2018 CLINICAL DATA:  Chest pain EXAM: CHEST  1 VIEW COMPARISON:  02/11/2018 FINDINGS: Low lung volumes. Patchy atelectasis at the left base. No pleural effusion. Stable cardiomediastinal silhouette with minimal central congestion. No pneumothorax. IMPRESSION: Low lung volumes with minimal central congestion and patchy atelectasis at the left base Electronically Signed   By: [name]   On: 03/02/2018 19:59   Dg Chest 2 View  Result Date: 02/11/2018 CLINICAL DATA:  Fever and headache. EXAM: CHEST - 2 VIEW COMPARISON:  None. FINDINGS: AP and lateral views obtained. Low volumes. Asymmetric elevation right hemidiaphragm. The cardiopericardial silhouette is within normal limits for size. There is pulmonary vascular congestion without overt pulmonary edema. The visualized bony structures of the thorax are intact. IMPRESSION: Low volume film with vascular congestion. Electronically Signed   By: Misty Stanley M.D.   On: 02/11/2018 19:13   Dg Abd 1 View  Result Date: 03/03/2018 CLINICAL DATA:  Right-sided abdominal pain. Right-sided nephrostomy tube  placement yesterday. EXAM: ABDOMEN - 1 VIEW COMPARISON:  CT 02/16/2018. FINDINGS: Bilateral nephroureteral stents in place. Additionally right-sided nephrostomy tube in place. Normal bowel gas pattern. No bowel dilatation to suggest obstruction. Moderate stool in the ascending, distal transverse and descending colon. No evidence of free air on supine views. IMPRESSION: Bilateral ureteral stents in place. Right nephrostomy tube in place. Normal bowel gas pattern. Electronically Signed   By: [name]   On: 03/03/2018 04:10   Ct Abdomen Pelvis W Contrast  Addendum Date: 02/16/2018   ADDENDUM REPORT: 02/16/2018 18:28 ADDENDUM: The original report was by Dr. Van Clines. The following addendum is by Dr. Van Clines: The clinical team called me at about 5:40 p.m. Eastern time on February 16, 2018 and we discussed the above findings and impressions in detail. Electronically Signed   By: Van Clines M.D.   On: 02/16/2018 18:28   Result Date: 02/16/2018 CLINICAL DATA:  Drainage from vagina, query ureteral/bladder injury or fistula. EXAM: CT ABDOMEN AND PELVIS WITH CONTRAST TECHNIQUE: Multidetector CT imaging of the abdomen and pelvis was performed using the standard protocol following bolus administration of intravenous contrast. CONTRAST:  127mL ISOVUE-300 IOPAMIDOL (ISOVUE-300) INJECTION 61% COMPARISON:  Cystogram dated 02/16/2018; PET-CT from 02/03/2018 FINDINGS: Lower chest: Unremarkable Hepatobiliary: Hypodensity in segment 4 of the liver near the falciform ligament was not recently hypermetabolic and probably represents focal fatty infiltration. The liver and gallbladder appear otherwise unremarkable. No biliary dilatation. Pancreas: Unremarkable Spleen: Unremarkable Adrenals/Urinary Tract: Adrenal glands normal. Bilateral mild hydroureter and mild left hydronephrosis. There is leakage of contrast medium from the distal ureter vicinity, probably on the left side,  into a collection of  gas, fluid, and contrast just above the vaginal cuff. This is actively leaking from the ureter. This cystogram from earlier today did not appear to demonstrate leakage of contrast from the bladder, and accordingly the source of the leak is probably ureteral. The collection of extravasated ureteral contrast, gas, and fluid measures about 8.6 by 4.3 by 4.9 cm just above the vaginal cuff, but also with some separate enhancing pockets including a 3.8 by 2.0 by 2.1 cm probable abscess near the rectosigmoid junction on image 19/5, as well as a 6.6 by 3.3 by 5.8 cm collection of gas and fluid tracking along the left pelvic sidewall. The source of the gas could be infection, or a defect in the vaginal cuff. Either way, this is a set up for infection and abscess formation, and the lesion along the rectosigmoid junction appears to likely be a walled off abscess. I would not normally expected postoperative gas to still be present 8 days out after laparoscopy in the pelvis; the gas that is present is considered abnormal. Stomach/Bowel: There is potentially some secondary wall thickening of the rectosigmoid adjacent to the small abscess. I am skeptical of rectosigmoid as a source for the infection. Appendix normal. Vascular/Lymphatic: Mild abdominal aortic atherosclerotic calcification. Reproductive: Uterus absent.  Ovaries not well seen. Other: No supplemental non-categorized findings. Musculoskeletal: Mild left foraminal stenosis at L5-S1 due to facet and intervertebral spurring. Broad Schmorl's node along the superior endplate of L4. IMPRESSION: 1. Active leak of urinary tract contrast into a collection of fluid and gas just above the vaginal cuff. I favor the contrast leak is coming from the left distal ureter, less likely to be from the right distal ureter. Given the appearance on the earlier cystogram today, the bladder is not thought to be the source of the leak. Irregular collections of fluid and gas in the pelvis  suggesting early abscess formation, this gas indicates either infection or leak a coach of gas through the vaginal cuff (which in turn predisposes to infection). This gas is not normal postoperative gas 8 days out after laparoscopy. An encapsulated smaller abscess is present adjacent to the sigmoid colon. 2. Mild bilateral hydroureter and mild left hydronephrosis. This may be due to extrinsic mass effect and inflammation along the distal ureters. 3.  Aortoiliac atherosclerotic vascular disease. 4. Left foraminal impingement at L5-S1 due to spurring. Electronically Signed: By: Van Clines M.D. On: 02/16/2018 17:55   Dg Cystogram  Result Date: 02/16/2018 CLINICAL DATA:  57 year old female post hysterectomy. Vaginal leak. Initial encounter. EXAM: CYSTOGRAM TECHNIQUE: Patient came to radiology suite catheterized. Urinary bladder was filled with 210 mL Cysto-Hypaque 30% by drip infusion. Serial spot images were obtained during bladder filling and post draining. FLUOROSCOPY TIME:  Fluoroscopy Time:  1 minutes and 6 seconds. Radiation Exposure Index: 39 mGy. COMPARISON:  02/03/2018 PET-CT. FINDINGS: Mild irregularity of the posterior aspect of bladder wall may represent postoperative changes. No urinary bladder leak identified. Contrast drained. IMPRESSION: No urinary bladder leak identified. Irregularity of the posterior aspect of bladder may be related to postoperative changes. Please see follow-up CT report. Electronically Signed   By: Genia Del M.D.   On: 02/16/2018 18:18   Dg Chest Port 1 View  Result Date: 03/04/2018 CLINICAL DATA:  Sepsis. EXAM: PORTABLE CHEST 1 VIEW COMPARISON:  03/02/2018. FINDINGS: Poor inspiration. Normal sized heart. Mildly prominent interstitial markings. Mild patchy density in both lower lung zones. Unremarkable bones. IMPRESSION: 1. Poor inspiration with mild patchy atelectasis  or pneumonia at both lung bases with minimal progression. 2. Mild chronic interstitial lung  disease. Electronically Signed   By: Claudie Revering M.D.   On: 03/04/2018 08:54   Dg C-arm 1-60 Min-no Report  Result Date: 02/16/2018 Fluoroscopy was utilized by the requesting physician.  No radiographic interpretation.   Ir Nephrostomy Placement Right  Result Date: 03/02/2018 CLINICAL DATA:  Cervical carcinoma with postoperative uretero-vaginal fistulas and visualization of right ureteral stent in the vagina. Right percutaneous nephrostomy tube requested for urinary diversion. There is extravasation at the time of retrograde studies at the level of the distal left ureter as well. Bilateral ureteral stents are currently in place. EXAM: 1. ULTRASOUND GUIDANCE FOR PUNCTURE OF THE RIGHT RENAL COLLECTING SYSTEM. 2. RIGHT PERCUTANEOUS NEPHROSTOMY TUBE PLACEMENT. COMPARISON:  CT of the abdomen and pelvis on 02/16/2018 ANESTHESIA/SEDATION: 3.0 mg IV Versed; 100 mcg IV Fentanyl. Total Moderate Sedation Time 23 minutes. The patient's level of consciousness and physiologic status were continuously monitored during the procedure by Radiology nursing. CONTRAST:  20 mL Isovue-300 MEDICATIONS: 2 g IV Ancef. Antibiotic was administered in an appropriate time frame prior to skin puncture. FLUOROSCOPY TIME:  1 minutes and 18 seconds.  16.6 mGy. PROCEDURE: The procedure, risks, benefits, and alternatives were explained to the patient. Questions regarding the procedure were encouraged and answered. The patient understands and consents to the procedure. A time-out was performed prior to initiating the procedure. Both kidneys were inspected by ultrasound. The right flank region was prepped with chlorhexidine in a sterile fashion, and a sterile drape was applied covering the operative field. A sterile gown and sterile gloves were used for the procedure. Local anesthesia was provided with 1% Lidocaine. Ultrasound was used to localize the right kidney. Under direct ultrasound guidance, a 21 gauge needle was advanced into the renal  collecting system. Ultrasound image documentation was performed. Aspiration of urine sample was performed followed by contrast injection. A transitional dilator was advanced over a guidewire. Percutaneous tract dilatation was then performed over the guidewire. A 10-French percutaneous nephrostomy tube was then advanced and formed in the collecting system. Catheter position was confirmed by fluoroscopy after contrast injection. The catheter was attached to a gravity bag and secured at the skin with a Prolene retention suture and Stat-Lock device. COMPLICATIONS: None FINDINGS: Ultrasound demonstrates mild to moderate right-sided hydronephrosis. There is no significant left hydronephrosis. A 10 French nephrostomy tube was placed on the right and formed at the level of the renal pelvis. IMPRESSION: Right-sided percutaneous nephrostomy tube placement. A 10 French catheter was placed and formed in the renal pelvis. This will be left to gravity bag drainage. Ultrasound demonstrates no evidence of left-sided hydronephrosis currently. Attempt at placing a left percutaneous nephrostomy tube was deferred today. Left nephrostomy tube placement would be very difficult without some degree of hydronephrosis. Electronically Signed   By: Aletta Edouard M.D.   On: 03/02/2018 15:36       Subjective: Patient seen and examined the pressure this morning.  Remains comfortable.  No new issues/events.  Stable for discharge home today.  Discharge Exam: Vitals:   03/05/18 0600 03/05/18 0800  BP: 124/62 124/65  Pulse:    Resp: 13 13  Temp:    SpO2: 97% 94%   Vitals:   03/05/18 0400 03/05/18 0500 03/05/18 0600 03/05/18 0800  BP: 126/63 125/62 124/62 124/65  Pulse:      Resp: 12 13 13 13   Temp:      TempSrc:      SpO2:  98% 96% 97% 94%  Weight:      Height:        General: Pt is alert, awake, not in acute distress Cardiovascular: RRR, S1/S2 +, no rubs, no gallops Respiratory: CTA bilaterally, no wheezing, no  rhonchi Abdominal: Soft, NT, ND, bowel sounds +,right nephrostomy tube Extremities: no edema, no cyanosis    The results of significant diagnostics from this hospitalization (including imaging, microbiology, ancillary and laboratory) are listed below for reference.     Microbiology: Recent Results (from the past 240 hour(s))  Urine Culture     Status: Abnormal   Collection Time: 03/02/18  3:57 PM  Result Value Ref Range Status   Specimen Description   Final    KIDNEY RIGHT Performed at [facility] [address], [place], [place] [address]    Special Requests   Final    Normal Performed at [facility], [place] [address], [place], [place] [address]    Culture (A)  Final    <10,000 COLONIES/mL INSIGNIFICANT GROWTH Performed at [facility] [address], [place], [place] [address]    Report Status 03/04/2018 FINAL  Final  Culture, blood (routine x 2)     Status: None (Preliminary result)   Collection Time: 03/02/18  5:02 PM  Result Value Ref Range Status   Specimen Description   Final    BLOOD LEFT ANTECUBITAL Performed at [facility] [address] [place], [place] [address]    Special Requests   Final    BOTTLES DRAWN AEROBIC AND ANAEROBIC Blood Culture adequate volume Performed at [facility] [address] [place], [place] [address]    Culture   Final    NO GROWTH 3 DAYS Performed at [facility], [place] [address] [place], [place] [address]    Report Status PENDING  Incomplete  Culture, blood (routine x 2)     Status: None (Preliminary result)   Collection Time: 03/02/18  5:33 PM  Result Value Ref Range Status   Specimen Description   Final    BLOOD LEFT HAND Performed at [facility] [address], [place], [place] [address]    Special Requests   Final    BOTTLES DRAWN AEROBIC AND ANAEROBIC Blood Culture results may not be optimal due to an  inadequate volume of blood received in culture bottles Performed at [facility] [address], [place], [place] [address]    Culture   Final    NO GROWTH 3 DAYS Performed at [facility], [place] [address], [place], [place] [address]    Report Status PENDING  Incomplete  MRSA PCR Screening     Status: None   Collection Time: 03/02/18  6:30 PM  Result Value Ref Range Status   MRSA by PCR NEGATIVE NEGATIVE Final    Comment:        The GeneXpert MRSA Assay (FDA approved for NASAL specimens only), is one component of a comprehensive MRSA colonization surveillance program. It is not intended to diagnose MRSA infection nor to guide or monitor treatment for MRSA infections. Performed at [facility], [place] [address] [place], [place] [address]   Culture, blood (routine x 2)     Status: None (Preliminary result)   Collection Time: 03/03/18  7:54 AM  Result Value Ref Range Status   Specimen Description   Final    BLOOD LEFT ARM Performed at [facility], [place] [address], [place], [place] [address]    Special Requests   Final    BOTTLES DRAWN AEROBIC AND ANAEROBIC Blood Culture adequate volume Performed at [facility] [address], [place], [place] [address]    Culture   Final    NO GROWTH 2 DAYS Performed at [facility] [address], [place], [place] [address]    Report Status PENDING  Incomplete  Culture, blood (routine x 2)     Status: None (Preliminary result)   Collection Time: 03/03/18  7:54 AM  Result Value Ref Range Status   Specimen Description   Final    BLOOD LEFT HAND Performed at [facility] [address], [place], [place] [address]    Special Requests   Final    BOTTLES DRAWN AEROBIC ONLY Blood Culture adequate volume Performed at [facility] [address], [place], [place] [address]    Culture   Final    NO GROWTH 2  DAYS Performed at [facility] [address], [place], [place] [address]    Report Status PENDING  Incomplete  Culture, Urine     Status: None   Collection Time: 03/03/18 11:26 AM  Result Value Ref Range Status   Specimen Description   Final    URINE, RANDOM URINE BAG Performed at [facility] [address], [place], [place] [address]    Special Requests   Final    NONE Performed at [facility], [place] [address], [place], [place] [address]    Culture   Final    NO GROWTH Performed at [facility], [place] [address], [place],  [address]    Report Status 03/04/2018 FINAL  Final     Labs: BNP (last 3 results) No results for input(s): BNP in the last 8760 hours. Basic Metabolic Panel: Recent Labs  Lab 03/02/18 0952 03/02/18 1641 03/03/18 0315 03/04/18 0326 03/05/18 0325  NA 141 139 138 140 142  K 3.6 3.0* 3.3* 3.3* 3.4*  CL 105 106 110 110 105  CO2 28 26 24 26 28   GLUCOSE 103* 115* 146* 100* 86  BUN 14 13 11 6  5*  CREATININE 0.67 0.53 0.85 0.52 0.53  CALCIUM 9.2 8.2* 7.3* 7.8* 8.4*  MG  --   --  1.7 2.1  --    Liver Function Tests: Recent Labs  Lab 03/02/18 1641  AST 40  ALT 27  ALKPHOS 178*  BILITOT 0.9  PROT 6.2*  ALBUMIN 2.9*   No results for input(s): LIPASE, AMYLASE in the last 168 hours. No results for input(s): AMMONIA in the last 168 hours. CBC: Recent Labs  Lab 03/02/18 1641 03/03/18 0315 03/03/18 1033 03/03/18 1335 03/04/18 0326 03/05/18 0325  WBC 5.0 15.3* 14.6* 14.2* 10.3 8.1  NEUTROABS 4.8 14.8*  --  12.8* 8.7* 6.4  HGB 9.2* 7.3* 7.5* 8.1* 7.3* 8.6*  HCT 28.4* 22.9* 23.1* 24.9* 22.4* 25.5*  MCV 92.2 92.7 92.0 92.6 90.7 89.5  PLT 324 267 257 285 266 300   Cardiac Enzymes: No results for input(s): CKTOTAL, CKMB, CKMBINDEX, TROPONINI in the last 168 hours. BNP: Invalid input(s): POCBNP CBG: Recent Labs  Lab 03/05/18 0817 03/05/18 1241 03/05/18 1644 03/05/18 2303  03/06/18 0732  GLUCAP 71 146* 89 104* 87   D-Dimer No results for input(s): DDIMER in the last 72 hours. Hgb A1c No results for input(s): HGBA1C in the last 72 hours. Lipid  Profile No results for input(s): CHOL, HDL, LDLCALC, TRIG, CHOLHDL, LDLDIRECT in the last 72 hours. Thyroid function studies No results for input(s): TSH, T4TOTAL, T3FREE, THYROIDAB in the last 72 hours.  Invalid input(s): FREET3 Anemia work up No results for input(s): VITAMINB12, FOLATE, FERRITIN, TIBC, IRON, RETICCTPCT in the last 72 hours. Urinalysis    Component Value Date/Time   COLORURINE YELLOW 02/11/2018 1808   APPEARANCEUR CLEAR 02/11/2018 1808   LABSPEC 1.006 02/11/2018 1808   PHURINE 7.0 02/11/2018 1808   GLUCOSEU 50 (A) 02/11/2018 1808   HGBUR LARGE (A) 02/11/2018 1808   BILIRUBINUR NEGATIVE 02/11/2018 1808   BILIRUBINUR NEG 01/25/2013 0947   KETONESUR NEGATIVE 02/11/2018 1808   PROTEINUR NEGATIVE 02/11/2018 1808   UROBILINOGEN 0.2 01/25/2013 0947   UROBILINOGEN 0.2 03/29/2009 1409   NITRITE NEGATIVE 02/11/2018 1808   LEUKOCYTESUR NEGATIVE 02/11/2018 1808   Sepsis Labs Invalid input(s): PROCALCITONIN,  WBC,  LACTICIDVEN Microbiology Recent Results (from the past 240 hour(s))  Urine Culture     Status: Abnormal   Collection Time: 03/02/18  3:57 PM  Result Value Ref Range Status   Specimen Description   Final    KIDNEY RIGHT Performed at [facility], [place] [address] [place], [place] [address]    Special Requests   Final    Normal Performed at [facility], [place] [address], [place], [place] [address]    Culture (A)  Final    <10,000 COLONIES/mL INSIGNIFICANT GROWTH Performed at [place] [address] [place], [place] [address]    Report Status 03/04/2018 FINAL  Final  Culture, blood (routine x 2)     Status: None (Preliminary result)   Collection Time: 03/02/18  5:02 PM  Result Value Ref Range Status   Specimen Description   Final     BLOOD LEFT ANTECUBITAL Performed at [place] [address], [place], [place] [address]    Special Requests   Final    BOTTLES DRAWN AEROBIC AND ANAEROBIC Blood Culture adequate volume Performed at [place] [address], [place], [place] [address]    Culture   Final    NO GROWTH 3 DAYS Performed at [facility], [place] [address], [place], [place] [address]    Report Status PENDING  Incomplete  Culture, blood (routine x 2)     Status: None (Preliminary result)   Collection Time: 03/02/18  5:33 PM  Result Value Ref Range Status   Specimen Description   Final    BLOOD LEFT HAND Performed at [place] [address], [place], [place] [address]    Special Requests   Final    BOTTLES DRAWN AEROBIC AND ANAEROBIC Blood Culture results may not be optimal due to an inadequate volume of blood received in culture bottles Performed at [place] [address], [place], [place] [address]    Culture   Final    NO GROWTH 3 DAYS Performed at [facility], [place] [address] [place], [place] [address]    Report Status PENDING  Incomplete  MRSA PCR Screening     Status: None   Collection Time: 03/02/18  6:30 PM  Result Value Ref Range Status   MRSA by PCR NEGATIVE NEGATIVE Final    Comment:        The GeneXpert MRSA Assay (FDA approved for NASAL specimens only), is one component of a comprehensive MRSA colonization surveillance program. It is not  intended to diagnose MRSA infection nor to guide or monitor treatment for MRSA infections. Performed at [facility], [place] [address] [place], [place] [address]   Culture, blood (routine x 2)     Status: None (Preliminary result)   Collection Time: 03/03/18  7:54 AM  Result Value Ref Range Status   Specimen Description   Final    BLOOD LEFT ARM Performed at [place] [address]  [place], [place] [address]    Special Requests   Final    BOTTLES DRAWN AEROBIC AND ANAEROBIC Blood Culture adequate volume Performed at [place] [address], [place], [place] [address]    Culture   Final    NO GROWTH 2 DAYS Performed at [place] [address] [place], [place] [address]    Report Status PENDING  Incomplete  Culture, blood (routine x 2)     Status: None (Preliminary result)   Collection Time: 03/03/18  7:54 AM  Result Value Ref Range Status   Specimen Description   Final    BLOOD LEFT HAND Performed at [place] [address], [place], [place] [address]    Special Requests   Final    BOTTLES DRAWN AEROBIC ONLY Blood Culture adequate volume Performed at [place] [address], [place], [place] [address]    Culture   Final    NO GROWTH 2 DAYS Performed at [place] [address] [place], [place] [address]    Report Status PENDING  Incomplete  Culture, Urine     Status: None   Collection Time: 03/03/18 11:26 AM  Result Value Ref Range Status   Specimen Description   Final    URINE, RANDOM URINE BAG Performed at [place] [address] [place], [place] [address]    Special Requests   Final    NONE Performed at [facility], [place] [address] [place], [place] [address]    Culture   Final    NO GROWTH Performed at [facility], [place] [address], [place], [place] [address]    Report Status 03/04/2018 FINAL  Final    Please note: You were cared for by a hospitalist during your hospital stay. Once you are discharged, your primary care physician will handle any further medical issues. Please note that NO REFILLS for any discharge medications will be authorized once you are discharged, as it is imperative that you return to your primary care physician (or establish a relationship with a primary care physician if you do not have  one) for your post hospital discharge needs so that they can reassess your need for medications and monitor your lab values.    Time coordinating discharge: 40 minutes  SIGNED:   Shelly Coss, MD  [facility] 03/06/2018, 10:40 AM Pager [phone]  If 7PM-7AM, please contact night-coverage [email or web address] Password [email or web address]

## 2018-03-06 NOTE — Progress Notes (Signed)
Per CM patient needs order for RW.  MD notified via text page.  Patient has questions concerning urine output.  IR is to come and see patient per MD.

## 2018-03-06 NOTE — Progress Notes (Signed)
Received message from MD regarding patient's left nephrostomy tube. Patient had left nephrostomy tube placed 03/02/2018 with Dr. Kathlene Cote.  Patient states that drain is not draining as much fluid as it was before. Explained that this is due to her being off IV fluids since she is pending discharged. Instructed patient to follow-up with [place] in clinic 6 weeks after discharge.  All questions answered and concerns addressed. Patient conveys understanding and agrees with plan.  Bea Graff Ventura Leggitt, [name] 03/06/2018, 2:14 PM

## 2018-03-06 NOTE — Care Management Note (Addendum)
Case Management Note  Patient Details  Name: Amy Morrow MRN: [id number] Date of Birth: 01-11-1961  Subjective/Objective: 57 yo F with squamous cell carcinoma of the cervix, s/p radical hysterectomy with pelvic lymphadenectomy, ureterovaginal fistula, R nephrostomy tube placement by IR       Action/Plan: Assist with PCP and DME   Expected Discharge Date:  03/06/18               Expected Discharge Plan:  Home/Self Care  In-House Referral:     Discharge planning Services  CM Consult  Post Acute Care Choice:  Durable Medical Equipment Choice offered to:  Patient  DME Arranged:  [facility] rolling DME Agency:  [place]:    [facility] Agency:     Status of Service:  Completed, signed off  If discussed at [employer] of Stay Meetings, dates discussed:    Additional Comments: Met with pt. She plans to return home with the support of her husband and adult children. Pt doesn't have a PCP. She reports that she has Medicaid. Provided pt with a [facility] brochure and the phone number to call Monday morning to arrange an appointment. Discussed with pt the importance to f/u with a physician after being D/C from the hospital. She reports her daughter speaks [place] and she can call for the appointment. PT is recommending a RW. Contacted Reggie at [facility] for DME referral.  Norina Buzzard, RN 03/06/2018, 1:10 PM

## 2018-03-07 LAB — TYPE AND SCREEN
ABO/RH(D): O POS
ANTIBODY SCREEN: NEGATIVE
UNIT DIVISION: 0
Unit division: 0

## 2018-03-07 LAB — CULTURE, BLOOD (ROUTINE X 2)
CULTURE: NO GROWTH
Culture: NO GROWTH
Special Requests: ADEQUATE

## 2018-03-07 LAB — BPAM RBC
BLOOD PRODUCT EXPIRATION DATE: 201909042359
BLOOD PRODUCT EXPIRATION DATE: 201909042359
Unit Type and Rh: 5100
Unit Type and Rh: 5100

## 2018-03-08 ENCOUNTER — Other Ambulatory Visit (HOSPITAL_COMMUNITY): Payer: Self-pay | Admitting: Interventional Radiology

## 2018-03-08 DIAGNOSIS — Z9889 Other specified postprocedural states: Secondary | ICD-10-CM

## 2018-03-08 LAB — CULTURE, BLOOD (ROUTINE X 2)
CULTURE: NO GROWTH
Culture: NO GROWTH
Special Requests: ADEQUATE
Special Requests: ADEQUATE

## 2018-03-10 NOTE — Progress Notes (Signed)
Follow-up Note: GYN-ONC  Consult was requested by Dr. Darron Doom, MD   CC:  Chief Complaint  Patient presents with  . Malignant neoplasm of exocervix ([place])  uretero-vaginal fistulae.  Patient was seen with an interpretor.   Assessment/Plan: 1. Stage IB1 cervical carcinoma o S/p radical hysterectomy with pelvic lymphadenectomy  o Low risk features, no adjuvant therapy recommended 2. Ureterovaginal fistula (bilateral)  o S/p right nephrostomy tube placement 03/02/18 o S/p admission for urosepsis - clinically resolved. Will write for 100mg  daily macrodantin for prophylaxis given communication between ureters and vagina with foreign bodies (stents) in ureters.  o still leaking urine (though slightly less). Likely from left ureterovaginal fistula which is now visible on vaginal exam. o Recommend placement of foley catheter. If she remains "wet" to a bothersome degree with this in place, the next option would be percutaneous nephrostomy tube on the left (she had been evaluated for this by IR when they placed the right but did not feel it was feasible at that time due to lack of hydro on the left).  o Dr Burman Nieves from [facility] on consult for this.   I will see her back in 4 weeks for evaluation.  HPI: [name]  is a very nice 57 y.o.  P5  She has been undergoing cervical cancer screening through [facility]. 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 01/2012 LSIL + endometrial cells 07/2012 Pap negative 01/2013 Pap negative with HRHPV not detected 01/2015 Pap negative and HRHPV not detected  Was noting postmenopausal bleeding and saw [facility] again. 12/2017 Pap negative now HRHPV detected - Mass noted on exam and she was referred to the [facility] where Dr. 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   Referred for management. Only complaint is the PMB. Denies pain. States bleeding minimal.  She underwent a  PET/CT which showed no apparent metastatic disease. On 02/08/18 she underwent robotic assisted radical hysterectomy, upper vaginectomy, bilateral pelvic lymphadenectomy.  Operative findings were significant for a 2 to 3 cm pedunculated exophytic mass from the right anterior cervix.  There is no clinical involvement of the parametrium and no suspicious nodes.  The surgery was overall fairly uncomplicated there was some increased bleeding encountered during the dissection around the posterior bladder pillars.  At the completion of the procedure the ureters bilaterally appeared intact and were completely skeletonized in the distal third free from all parametrial tissue.  Patient was readmitted on postop day 7 with bilateral ureterovaginal fistulas is confirmed on both CT urogram, as well as cystoscopy with retrograde pyelography with Dr. Louis Meckel.  During that cystoscopic procedure Dr. Louis Meckel placed ureteral stents bilaterally.  The patient was treated with broad-spectrum antibiotics to cover pelvic infection.  She was discharged after a 3-day hospital stay.  Interval Hx:  On 03/02/18 she underwent right percutaneous nephrostomy tube placement. Immediately after placement she developed fevers and hypotension and was admitted with sepsis from occult pyelonephritis. She received IVF resuscitation and IV antibiotics. She was discharged on 03/06/18. Since placement of the right PCN she has noted slight decrease in urinary leakage from the vagina, though still persists.    Current Meds:  Outpatient Encounter Medications as of 03/11/2018  Medication Sig  . acetaminophen (TYLENOL) 500 MG tablet Take 500 mg by mouth every 6 (six) hours as needed for fever.  . metFORMIN (GLUCOPHAGE XR) 500 MG 24 hr tablet Take 1 tablet (500 mg total) by  mouth daily with breakfast.  . polyethylene glycol (MIRALAX / GLYCOLAX) packet Take 17 g by mouth daily. (Patient taking differently: Take 17 g by mouth daily as needed. )  .  [EXPIRED] levofloxacin (LEVAQUIN) 500 MG tablet Take 1 tablet (500 mg total) by mouth daily for 3 days.  . nitrofurantoin (MACRODANTIN) 100 MG capsule Take 1 capsule (100 mg total) by mouth at bedtime.  . [DISCONTINUED] docusate sodium (COLACE) 100 MG capsule Take 1 capsule (100 mg total) by mouth 2 (two) times daily. (Patient not taking: Reported on 03/11/2018)  . [DISCONTINUED] senna (SENOKOT) 8.6 MG TABS tablet Take 1 tablet (8.6 mg total) by mouth at bedtime. (Patient not taking: Reported on 03/11/2018)   No facility-administered encounter medications on file as of 03/11/2018.     Allergy: No Known Allergies  Social Hx:  Tobacco use: none Alcohol use: none Illicit Drug use: none Illicit IV Drug use: none  Past Surgical Hx:  Past Surgical History:  Procedure Laterality Date  . CYSTOSCOPY W/ RETROGRADES Bilateral 02/16/2018   Procedure: CYSTOSCOPY WITH RETROGRADE PYELOGRAM BILATERAL DIAGNOSTIC URETEROSCOPY, BILATERAL  STENT PLACEMENT;  Surgeon: Ardis Hughs, MD;  Location: [facility];  Service: Urology;  Laterality: Bilateral;  . D & C LEEP CONIZATION BX  07-31-2003   dr p. rose  [facility]  . IR NEPHROSTOMY PLACEMENT RIGHT  03/02/2018  . PELVIC LYMPH NODE DISSECTION Bilateral 02/08/2018   Procedure: BILATEAL PEVIC  LYMPHADENECTOMY;  Surgeon: [name], MD;  Location: [facility];  Service: Gynecology;  Laterality: Bilateral;  . ROBOTIC ASSISTED TOTAL HYSTERECTOMY WITH BILATERAL SALPINGO OOPHERECTOMY N/A 02/08/2018   Procedure: XI ROBOTIC ASSISTED TOTAL Radical HYSTERECTOMY WITH BILATERAL SALPINGO OOPHORECTOMY;  Surgeon: [name], MD;  Location: [facility];  Service: Gynecology;  Laterality: N/A;  . Transvaginal tape placement  03-12-2009  dr Emeterio Reeve  [facility]   GYNECARE TENSION-FREE VAGINAL TAPE SLING  . TUBAL LIGATION  05-28-2005  DR  MARSHALL  @ [facility]   PPTL    Past Medical Hx:  Past Medical History:  Diagnosis Date  . Abnormal Pap smear   . Anemia   . Cervical cancer [facility]) oncologist- dr Denman George    Stage IB1  SCCa   . Hyperlipidemia   . Urgency of urination    intermittant    Past Gynecological History:   GYNECOLOGIC HISTORY:  Patient's last menstrual period was 11/16/2012. Menarche: 57 years old P 5 LMP 57 yo Contraceptive 5 years OCP HRT none  Last Pap see HPI  Family Hx:  Family History  Problem Relation Age of Onset  . Hypertension Mother   . Heart disease Mother   . Hypertension Brother   . Heart disease Brother     Review of Systems:  Review of Systems  Constitutional: Negative.   HENT:  Negative.   Eyes: Negative.   Respiratory: Negative.   Cardiovascular: Negative.   Gastrointestinal: Positive for constipation.  Endocrine: Negative.   Genitourinary: Positive for vaginal bleeding.        Leakage of urine from vagina  Musculoskeletal: Negative.   Skin: Negative.   Neurological: Negative.   Hematological: Negative.   Psychiatric/Behavioral: Positive for depression.    Vitals:  Blood pressure 117/77, pulse 96, temperature 98.5 F (36.9 C), temperature source Oral, resp. rate 20, height 4\' 11"  (1.499 m), weight 139 lb (63 kg), last menstrual period 11/16/2012, SpO2 99 %. Body mass index is 28.07 kg/m.   Physical Exam: ECOG PERFORMANCE STATUS: 1 - Symptomatic but completely ambulatory   General :  Well developed, 57 y.o., female in no apparent distress HEENT:  Normocephalic/atraumatic, symmetric, EOMI, eyelids normal Neck:   Supple, no masses.  Lymphatics:  No cervical/ submandibular/ supraclavicular/ infraclavicular/ inguinal adenopathy Respiratory:  Respirations unlabored, no use of accessory muscles CV:   Deferred Breast:  Deferred Musculoskeletal: No CVA tenderness, normal muscle strength. Abdomen:  Soft, non-tender and nondistended. No evidence of hernia. No masses. Well healed incisions.  Extremities:  No lymphedema, no erythema, non-tender. Skin:   Normal inspection Neuro/Psych:  No focal motor deficit, no abnormal mental status. Normal  gait. Normal affect. Alert and oriented to person, place, and time  Genito Urinary: Speculum exam reveals approximately 10cc of urine, no blood. When evacuated the cuff appears intact at the apex, however on the right, and now the left also, the stent is visible for a 2cm length at the right and left vaginal fornices.  No active bleeding.  Rectovaginal:  deferred    Thereasa Solo, MD  03/11/2018, 10:27 AM    Cc: Darron Doom, MD (Referring Ob/Gyn)

## 2018-03-11 ENCOUNTER — Inpatient Hospital Stay: Payer: Medicaid Other | Attending: Obstetrics | Admitting: Gynecologic Oncology

## 2018-03-11 ENCOUNTER — Encounter: Payer: Self-pay | Admitting: Gynecologic Oncology

## 2018-03-11 VITALS — BP 117/77 | HR 96 | Temp 98.5°F | Resp 20 | Ht 59.0 in | Wt 139.0 lb

## 2018-03-11 DIAGNOSIS — N821 Other female urinary-genital tract fistulae: Secondary | ICD-10-CM | POA: Insufficient documentation

## 2018-03-11 DIAGNOSIS — Z90722 Acquired absence of ovaries, bilateral: Secondary | ICD-10-CM | POA: Insufficient documentation

## 2018-03-11 DIAGNOSIS — C531 Malignant neoplasm of exocervix: Secondary | ICD-10-CM | POA: Insufficient documentation

## 2018-03-11 DIAGNOSIS — Z90710 Acquired absence of both cervix and uterus: Secondary | ICD-10-CM

## 2018-03-11 MED ORDER — NITROFURANTOIN MACROCRYSTAL 100 MG PO CAPS: 100.0000 mg | ORAL_CAPSULE | Freq: Every day | ORAL | 1 refills | Status: DC

## 2018-03-11 MED FILL — NORMAL SALINE FLUSH SYRINGE: 0.9 | 30 days supply | Qty: 300 | Fill #0

## 2018-03-11 NOTE — Patient Instructions (Addendum)
Dr Denman George is recommending that you have a catheter placed in the bladder to see if this improves the drainage from the vagina. If this does not improve the drainage from the vagina, then next option would be to have a tube placed into the left kidney (the same procedure that was done on the right).  The hole in the vagina between it and the bladder has not yet healed.  Dr Denman George has prescribed an antibiotic tablet to take once daily to prevent urinary tract infection.   Dr Denman George will see you back for follow-up in approximately 4 weeks. Please contact her office at [phone] with questions.  Rowe Robert, PA will write a prescription for more prefilled saline syringes.  Please go by radiology at [facility] and pick up the prescription. You can get these filled at [facility].  Per Rowe Robert, PA if the nephrostomy tube is not draining, bloody, or if it doesn't have any debris it is not necessary to flush the tube everyday.  If the nephrostomy tube is is draining a lot, is very bloody, and has a lot of debris flush the nephrostomy tube.  If you have any further questions or concerns about the nephrostomy tube please contact interventional radiology at [phone].

## 2018-03-12 ENCOUNTER — Other Ambulatory Visit: Payer: Self-pay | Admitting: Gynecologic Oncology

## 2018-03-12 ENCOUNTER — Encounter: Payer: Self-pay | Admitting: Gynecologic Oncology

## 2018-03-12 DIAGNOSIS — N821 Other female urinary-genital tract fistulae: Secondary | ICD-10-CM

## 2018-03-12 MED ORDER — NITROFURANTOIN MACROCRYSTAL 100 MG PO CAPS: 100.0000 mg | ORAL_CAPSULE | Freq: Every day | ORAL | 1 refills | Status: DC

## 2018-03-12 MED FILL — NITROFURANTOIN MCR 100 MG C: 100 | 30 days supply | Qty: 30 | Fill #0

## 2018-03-12 NOTE — Progress Notes (Signed)
Patient presents to the office today with her son without an appointment wanting the foley catheter placed yesterday removed.  Per interpreter, Almyra Free, patient states the drainage from her vagina has worsened since placement of the foley and she wants it out.  11 F foley removed without difficulty at 11:04am.  Around 100 cc of pink urine in the leg bag.  Patient asking via interpreter if the drainage from the vagina would continue.  I advised the patient per the convo with Dr. Denman George yesterday that it would most likely continue.  Dr. Denman George had advised her that having a PCN tube placed on the left may stop the drainage since her output would be diverted.  Patient decided she would like to proceed with this if possible.  Also she was unaware of the prescription for [facility] sent yesterday by Dr. Denman George and wanted it sent to the [facility] [place].  Medication resent per her request.  Advised she would be hearing from radiology scheduling about scheduling the left PCN tube.  She is advised to call for any needs in between that time.

## 2018-03-12 NOTE — Addendum Note (Signed)
Addended by: Joylene John D on: 03/12/2018 11:24 AM   Modules accepted: Orders

## 2018-03-16 ENCOUNTER — Ambulatory Visit (HOSPITAL_COMMUNITY)
Admission: RE | Admit: 2018-03-16 | Discharge: 2018-03-16 | Disposition: A | Discharge status: Home / Self Care | Payer: Medicaid Other | Source: Ambulatory Visit | Attending: Gynecologic Oncology | Admitting: Gynecologic Oncology

## 2018-03-16 ENCOUNTER — Ambulatory Visit (HOSPITAL_COMMUNITY): Payer: Medicaid Other

## 2018-03-16 DIAGNOSIS — N281 Cyst of kidney, acquired: Secondary | ICD-10-CM | POA: Diagnosis present

## 2018-03-16 DIAGNOSIS — N821 Other female urinary-genital tract fistulae: Secondary | ICD-10-CM

## 2018-03-16 DIAGNOSIS — Z960 Presence of urogenital implants: Secondary | ICD-10-CM | POA: Insufficient documentation

## 2018-03-17 ENCOUNTER — Other Ambulatory Visit: Payer: Self-pay | Admitting: Student

## 2018-03-17 ENCOUNTER — Telehealth (INDEPENDENT_AMBULATORY_CARE_PROVIDER_SITE_OTHER): Payer: Self-pay

## 2018-03-17 ENCOUNTER — Other Ambulatory Visit: Payer: Self-pay | Admitting: Gynecologic Oncology

## 2018-03-17 ENCOUNTER — Other Ambulatory Visit (INDEPENDENT_AMBULATORY_CARE_PROVIDER_SITE_OTHER): Payer: Self-pay | Admitting: Physician Assistant

## 2018-03-17 DIAGNOSIS — N821 Other female urinary-genital tract fistulae: Secondary | ICD-10-CM

## 2018-03-17 DIAGNOSIS — R948 Abnormal results of function studies of other organs and systems: Secondary | ICD-10-CM

## 2018-03-17 NOTE — Telephone Encounter (Signed)
Tonya from [facility] called and asked that a thyroid US be ordered on patient. There has to be an ultrasound before FNA can be performed. Nat Christen, CMA

## 2018-03-17 NOTE — Telephone Encounter (Signed)
Ok, ordered.Thank you!

## 2018-03-18 ENCOUNTER — Encounter (HOSPITAL_COMMUNITY): Payer: Self-pay

## 2018-03-18 ENCOUNTER — Other Ambulatory Visit: Payer: Self-pay

## 2018-03-18 ENCOUNTER — Ambulatory Visit (HOSPITAL_COMMUNITY)
Admission: RE | Admit: 2018-03-18 | Discharge: 2018-03-18 | Disposition: A | Discharge status: Home / Self Care | Payer: Medicaid Other | Source: Ambulatory Visit | Attending: Gynecologic Oncology | Admitting: Gynecologic Oncology

## 2018-03-18 ENCOUNTER — Observation Stay (HOSPITAL_COMMUNITY)
Admission: RE | Admit: 2018-03-18 | Discharge: 2018-03-19 | Disposition: A | Discharge status: Home / Self Care | Payer: Medicaid Other | Source: Ambulatory Visit | Attending: Internal Medicine | Admitting: Internal Medicine

## 2018-03-18 ENCOUNTER — Other Ambulatory Visit: Payer: Self-pay | Admitting: Gynecologic Oncology

## 2018-03-18 DIAGNOSIS — T83032A Leakage of nephrostomy catheter, initial encounter: Secondary | ICD-10-CM | POA: Diagnosis present

## 2018-03-18 DIAGNOSIS — E785 Hyperlipidemia, unspecified: Secondary | ICD-10-CM | POA: Insufficient documentation

## 2018-03-18 DIAGNOSIS — N821 Other female urinary-genital tract fistulae: Secondary | ICD-10-CM

## 2018-03-18 DIAGNOSIS — Z7984 Long term (current) use of oral hypoglycemic drugs: Secondary | ICD-10-CM | POA: Diagnosis not present

## 2018-03-18 DIAGNOSIS — C539 Malignant neoplasm of cervix uteri, unspecified: Secondary | ICD-10-CM | POA: Diagnosis present

## 2018-03-18 DIAGNOSIS — Y733 Surgical instruments, materials and gastroenterology and urology devices (including sutures) associated with adverse incidents: Secondary | ICD-10-CM | POA: Diagnosis not present

## 2018-03-18 DIAGNOSIS — E119 Type 2 diabetes mellitus without complications: Secondary | ICD-10-CM | POA: Diagnosis not present

## 2018-03-18 HISTORY — PX: IR NEPHROSTOGRAM RIGHT THRU EXISTING ACCESS: IMG6062

## 2018-03-18 HISTORY — PX: IR NEPHROSTOMY PLACEMENT LEFT: IMG6063

## 2018-03-18 LAB — CBC WITH DIFFERENTIAL/PLATELET
Basophils Absolute: 0 10*3/uL (ref 0.0–0.1)
Basophils Relative: 0 %
EOS PCT: 3 %
Eosinophils Absolute: 0.2 10*3/uL (ref 0.0–0.7)
HCT: 29.8 % — ABNORMAL LOW (ref 36.0–46.0)
Hemoglobin: 9.5 g/dL — ABNORMAL LOW (ref 12.0–15.0)
LYMPHS ABS: 1.1 10*3/uL (ref 0.7–4.0)
LYMPHS PCT: 17 %
MCH: 29.1 pg (ref 26.0–34.0)
MCHC: 31.9 g/dL (ref 30.0–36.0)
MCV: 91.4 fL (ref 78.0–100.0)
MONO ABS: 0.5 10*3/uL (ref 0.1–1.0)
MONOS PCT: 7 %
Neutro Abs: 4.7 10*3/uL (ref 1.7–7.7)
Neutrophils Relative %: 73 %
PLATELETS: 462 10*3/uL — AB (ref 150–400)
RBC: 3.26 MIL/uL — ABNORMAL LOW (ref 3.87–5.11)
RDW: 13.9 % (ref 11.5–15.5)
WBC: 6.5 10*3/uL (ref 4.0–10.5)

## 2018-03-18 LAB — COMPREHENSIVE METABOLIC PANEL
ALT: 14 U/L (ref 0–44)
AST: 19 U/L (ref 15–41)
Albumin: 3.4 g/dL — ABNORMAL LOW (ref 3.5–5.0)
Alkaline Phosphatase: 79 U/L (ref 38–126)
Anion gap: 7 (ref 5–15)
BUN: 12 mg/dL (ref 6–20)
CHLORIDE: 104 mmol/L (ref 98–111)
CO2: 29 mmol/L (ref 22–32)
CREATININE: 0.57 mg/dL (ref 0.44–1.00)
Calcium: 8.9 mg/dL (ref 8.9–10.3)
GFR calc Af Amer: >60 mL/min (ref >60)
GLUCOSE: 110 mg/dL — AB (ref 70–99)
POTASSIUM: 3.3 mmol/L — AB (ref 3.5–5.1)
Sodium: 140 mmol/L (ref 135–145)
Total Bilirubin: 0.7 mg/dL (ref 0.3–1.2)
Total Protein: 6.8 g/dL (ref 6.5–8.1)

## 2018-03-18 LAB — CBC
HCT: 32.3 % — ABNORMAL LOW (ref 36.0–46.0)
Hemoglobin: 10.7 g/dL — ABNORMAL LOW (ref 12.0–15.0)
MCH: 29.9 pg (ref 26.0–34.0)
MCHC: 33.1 g/dL (ref 30.0–36.0)
MCV: 90.2 fL (ref 78.0–100.0)
PLATELETS: 539 10*3/uL — AB (ref 150–400)
RBC: 3.58 MIL/uL — ABNORMAL LOW (ref 3.87–5.11)
RDW: 13.9 % (ref 11.5–15.5)
WBC: 5.5 10*3/uL (ref 4.0–10.5)

## 2018-03-18 LAB — PROTIME-INR
INR: 1
Prothrombin Time: 13.1 seconds (ref 11.4–15.2)

## 2018-03-18 LAB — HEMOGLOBIN A1C
HEMOGLOBIN A1C: 5.5 % (ref 4.8–5.6)
Mean Plasma Glucose: 111.15 mg/dL

## 2018-03-18 LAB — TYPE AND SCREEN
ABO/RH(D): O POS
ANTIBODY SCREEN: NEGATIVE

## 2018-03-18 LAB — GLUCOSE, CAPILLARY
GLUCOSE-CAPILLARY: 112 mg/dL — AB (ref 70–99)
Glucose-Capillary: 102 mg/dL — ABNORMAL HIGH (ref 70–99)

## 2018-03-18 LAB — APTT: APTT: 31 s (ref 24–36)

## 2018-03-18 MED ORDER — FENTANYL CITRATE (PF) 100 MCG/2ML IJ SOLN
INTRAMUSCULAR | Status: AC
  Filled 2018-03-18: qty 4

## 2018-03-18 MED ORDER — ONDANSETRON HCL 4 MG/2ML IJ SOLN
4.0000 mg | Freq: Four times a day (QID) | INTRAMUSCULAR | Status: DC | PRN
  Administered 2018-03-18: 4 mg via INTRAVENOUS
  Filled 2018-03-18: qty 2

## 2018-03-18 MED ORDER — ACETAMINOPHEN 650 MG RE SUPP: 650.0000 mg | Freq: Four times a day (QID) | RECTAL | Status: DC | PRN

## 2018-03-18 MED ORDER — FENTANYL CITRATE (PF) 100 MCG/2ML IJ SOLN
INTRAMUSCULAR | Status: AC | PRN
  Administered 2018-03-18 (×4): 50 ug via INTRAVENOUS

## 2018-03-18 MED ORDER — FLUMAZENIL 0.5 MG/5ML IV SOLN
INTRAVENOUS | Status: AC
  Filled 2018-03-18: qty 5

## 2018-03-18 MED ORDER — LIDOCAINE-EPINEPHRINE (PF) 2 %-1:200000 IJ SOLN
INTRAMUSCULAR | Status: AC
  Filled 2018-03-18: qty 20

## 2018-03-18 MED ORDER — MIDAZOLAM HCL 2 MG/2ML IJ SOLN
INTRAMUSCULAR | Status: AC
Start: 2018-03-18 — End: 2018-03-19
  Filled 2018-03-18: qty 4

## 2018-03-18 MED ORDER — IOPAMIDOL (ISOVUE-300) INJECTION 61%
50.0000 mL | Freq: Once | INTRAVENOUS | Status: AC | PRN
  Administered 2018-03-18: 30 mL

## 2018-03-18 MED ORDER — ONDANSETRON HCL 4 MG PO TABS: 4.0000 mg | ORAL_TABLET | Freq: Four times a day (QID) | ORAL | Status: DC | PRN

## 2018-03-18 MED ORDER — FENTANYL CITRATE (PF) 100 MCG/2ML IJ SOLN
50.0000 ug | Freq: Once | INTRAMUSCULAR | Status: AC
  Administered 2018-03-18: 50 ug via INTRAVENOUS
  Filled 2018-03-18: qty 2

## 2018-03-18 MED ORDER — IOPAMIDOL (ISOVUE-300) INJECTION 61%
INTRAVENOUS | Status: AC
  Administered 2018-03-18: 30 mL
  Filled 2018-03-18: qty 50

## 2018-03-18 MED ORDER — ONDANSETRON HCL 4 MG/2ML IJ SOLN
INTRAMUSCULAR | Status: AC
  Filled 2018-03-18: qty 2

## 2018-03-18 MED ORDER — SODIUM CHLORIDE 0.9 % IV SOLN
INTRAVENOUS | Status: DC
  Administered 2018-03-18: 13:00:00 via INTRAVENOUS

## 2018-03-18 MED ORDER — SODIUM CHLORIDE 0.9 % IV SOLN
INTRAVENOUS | Status: DC
  Administered 2018-03-18: 22:00:00 via INTRAVENOUS

## 2018-03-18 MED ORDER — MIDAZOLAM HCL 2 MG/2ML IJ SOLN
INTRAMUSCULAR | Status: AC | PRN
  Administered 2018-03-18 (×4): 1 mg via INTRAVENOUS

## 2018-03-18 MED ORDER — NITROFURANTOIN MACROCRYSTAL 100 MG PO CAPS
100.0000 mg | ORAL_CAPSULE | Freq: Every day | ORAL | Status: DC
  Filled 2018-03-18: qty 1

## 2018-03-18 MED ORDER — NALOXONE HCL 0.4 MG/ML IJ SOLN
INTRAMUSCULAR | Status: AC
  Filled 2018-03-18: qty 1

## 2018-03-18 MED ORDER — DOCUSATE SODIUM 100 MG PO CAPS
100.0000 mg | ORAL_CAPSULE | Freq: Two times a day (BID) | ORAL | Status: DC
  Administered 2018-03-19: 100 mg via ORAL
  Filled 2018-03-18 (×2): qty 1

## 2018-03-18 MED ORDER — SODIUM CHLORIDE 0.9% FLUSH
5.0000 mL | Freq: Three times a day (TID) | INTRAVENOUS | Status: DC
  Administered 2018-03-18: 5 mL

## 2018-03-18 MED ORDER — CIPROFLOXACIN IN D5W 400 MG/200ML IV SOLN
INTRAVENOUS | Status: AC
  Administered 2018-03-18: 400 mg via INTRAVENOUS
  Filled 2018-03-18: qty 200

## 2018-03-18 MED ORDER — INSULIN ASPART 100 UNIT/ML ~~LOC~~ SOLN: 0.0000 [IU] | Freq: Every day | SUBCUTANEOUS | Status: DC

## 2018-03-18 MED ORDER — ACETAMINOPHEN 325 MG PO TABS: 650.0000 mg | ORAL_TABLET | Freq: Four times a day (QID) | ORAL | Status: DC | PRN

## 2018-03-18 MED ORDER — INSULIN ASPART 100 UNIT/ML ~~LOC~~ SOLN: 0.0000 [IU] | Freq: Three times a day (TID) | SUBCUTANEOUS | Status: DC

## 2018-03-18 MED ORDER — HYDROCODONE-ACETAMINOPHEN 5-325 MG PO TABS: 1.0000 | ORAL_TABLET | Freq: Four times a day (QID) | ORAL | Status: DC | PRN

## 2018-03-18 MED ORDER — ONDANSETRON HCL 4 MG/2ML IJ SOLN
4.0000 mg | Freq: Once | INTRAMUSCULAR | Status: AC
  Administered 2018-03-18: 4 mg via INTRAVENOUS

## 2018-03-18 MED ORDER — CIPROFLOXACIN IN D5W 400 MG/200ML IV SOLN
400.0000 mg | Freq: Once | INTRAVENOUS | Status: AC
  Administered 2018-03-18: 400 mg via INTRAVENOUS

## 2018-03-18 MED ORDER — POLYETHYLENE GLYCOL 3350 17 G PO PACK
17.0000 g | PACK | Freq: Every day | ORAL | Status: DC
  Administered 2018-03-18 – 2018-03-19 (×2): 17 g via ORAL
  Filled 2018-03-18 (×2): qty 1

## 2018-03-18 MED ORDER — MORPHINE SULFATE (PF) 4 MG/ML IV SOLN
2.0000 mg | INTRAVENOUS | Status: DC | PRN
  Administered 2018-03-18: 2 mg via INTRAVENOUS
  Filled 2018-03-18: qty 1

## 2018-03-18 NOTE — Progress Notes (Signed)
Report called to [place], Therapist, sports.  Patient transported to 1618 via stretcher by staff with no issues noted.

## 2018-03-18 NOTE — H&P (Signed)
[facility] History and Physical   Patient: Amy Morrow QPY:[id number]   PCP: Patient, No Pcp Per DOB: Jul 09, 1961   DOA: 03/18/2018   DOS: 03/18/2018   DOS: the patient was seen and examined on 03/18/2018  Patient coming from: The patient is coming from home   Chief Complaint: Just had procedure  HPI: Jannah Guardiola Enriques is a 57 y.o. female with Past medical history of cervical cancer S/P radical hysterectomy and pelvic lymphadenectomy, ureteral vaginal fistula, type II DM, recent right PCN placement followed by sepsis needing ICU admission. Spanish interpreter was used for communication with the patient.  Patient presents to the IR for elective left PCN.  Due to her prior history of severe pain as well as severe sepsis after placement of right PCN oncology and interventional radiology requested the patient be admitted to the hospital for overnight observation. At the time of my evaluation patient does not have any other acute complaint other than left-sided pain where PCI was performed.  No nausea no vomiting.  No diarrhea.  She actually has constipation at home.  No abdominal pain no chest pain.  No shortness of breath.  No headache no dizziness no fever at home. No recent change in medications reported as well.  Per IR patient tolerated the procedure very well and did not have any acute complication.  At her baseline ambulates without support And is independent for most of her ADL; manages her medication on her own.  Review of Systems: as mentioned in the history of present illness.  All other systems reviewed and are negative.  Past Medical History:  Diagnosis Date  . Abnormal Pap smear   . Anemia   . Cervical cancer [facility]) oncologist- dr Denman George   Stage IB1  SCCa   . Hyperlipidemia   . Urgency of urination    intermittant   Past Surgical History:  Procedure Laterality Date  . CYSTOSCOPY W/ RETROGRADES Bilateral 02/16/2018   Procedure: CYSTOSCOPY WITH RETROGRADE  PYELOGRAM BILATERAL DIAGNOSTIC URETEROSCOPY, BILATERAL  STENT PLACEMENT;  Surgeon: Ardis Hughs, MD;  Location: [facility];  Service: Urology;  Laterality: Bilateral;  . D & C LEEP CONIZATION BX  07-31-2003   dr p. rose  [facility]  . IR NEPHROSTOGRAM RIGHT THRU EXISTING ACCESS  03/18/2018  . IR NEPHROSTOMY PLACEMENT LEFT  03/18/2018  . IR NEPHROSTOMY PLACEMENT RIGHT  03/02/2018  . PELVIC LYMPH NODE DISSECTION Bilateral 02/08/2018   Procedure: BILATEAL PEVIC  LYMPHADENECTOMY;  Surgeon: [name], MD;  Location: [facility];  Service: Gynecology;  Laterality: Bilateral;  . ROBOTIC ASSISTED TOTAL HYSTERECTOMY WITH BILATERAL SALPINGO OOPHERECTOMY N/A 02/08/2018   Procedure: XI ROBOTIC ASSISTED TOTAL Radical HYSTERECTOMY WITH BILATERAL SALPINGO OOPHORECTOMY;  Surgeon: [name], MD;  Location: [facility];  Service: Gynecology;  Laterality: N/A;  . Transvaginal tape placement  03-12-2009  dr Emeterio Reeve  [facility]   GYNECARE TENSION-FREE VAGINAL TAPE SLING  . TUBAL LIGATION  05-28-2005  DR  MARSHALL  @ [facility]   PPTL   Social History:  reports that she has never smoked. She has never used smokeless tobacco. She reports that she does not drink alcohol or use drugs.  No Known Allergies  Family History  Problem Relation Age of Onset  . Hypertension Mother   . Heart disease Mother   . Hypertension Brother   . Heart disease Brother      Prior to Admission medications   Medication Sig Start Date End Date Taking? Authorizing Provider  acetaminophen (TYLENOL) 500 MG  tablet Take 500 mg by mouth every 6 (six) hours as needed for fever.   Yes [provider]  metFORMIN (GLUCOPHAGE XR) 500 MG 24 hr tablet Take 1 tablet (500 mg total) by mouth daily with breakfast. 02/23/18  Yes [name], [name]  nitrofurantoin (MACRODANTIN) 100 MG capsule Take 1 capsule (100 mg total) by mouth at bedtime. 03/12/18  Yes Cross, Melissa D, NP  polyethylene glycol (MIRALAX / GLYCOLAX) packet Take 17 g by mouth daily. Patient taking  differently: Take 17 g by mouth daily as needed.  02/18/18  Yes [name], MD    Physical Exam: Vitals:   03/18/18 1600 03/18/18 1615 03/18/18 1630 03/18/18 1651  BP: 131/83  120/76 131/80  Pulse: 72 72 73 77  Resp:  16 16 16   Temp: 98.5 F (36.9 C)   98.5 F (36.9 C)  TempSrc:    Oral  SpO2: 98% 98% (!) 76% 97%    General: Alert, Awake and Oriented to Time, Place and Person. Appear in mild distress, affect appropriate Eyes: PERRL, Conjunctiva normal ENT: Oral Mucosa clear moist. Neck: no JVD, no Abnormal Mass Or lumps Cardiovascular: S1 and S2 Present, no Murmur, Peripheral Pulses Present Respiratory: normal respiratory effort, Bilateral Air entry equal and Decreased, no use of accessory muscle, Clear to Auscultation, no Crackles, no wheezes Abdomen: Bowel Sound present, Soft and mild left cva tenderness, no hernia Skin: no redness, no Rash, no induration Extremities: no Pedal edema, no calf tenderness Neurologic: Grossly no focal neuro deficit. Bilaterally Equal motor strength  Labs on Admission:  CBC: Recent Labs  Lab 03/18/18 1319  WBC 5.5  HGB 10.7*  HCT 32.3*  MCV 90.2  PLT 350*   Basic Metabolic Panel: No results for input(s): NA, K, CL, CO2, GLUCOSE, BUN, CREATININE, CALCIUM, MG, PHOS in the last 168 hours. GFR: Estimated Creatinine Clearance: 64.2 mL/min (by C-G formula based on SCr of 0.53 mg/dL). Liver Function Tests: No results for input(s): AST, ALT, ALKPHOS, BILITOT, PROT, ALBUMIN in the last 168 hours. No results for input(s): LIPASE, AMYLASE in the last 168 hours. No results for input(s): AMMONIA in the last 168 hours. Coagulation Profile: Recent Labs  Lab 03/18/18 1319  INR 1.00   Cardiac Enzymes: No results for input(s): CKTOTAL, CKMB, CKMBINDEX, TROPONINI in the last 168 hours. BNP (last 3 results) No results for input(s): PROBNP in the last 8760 hours. HbA1C: No results for input(s): HGBA1C in the last 72 hours. CBG: No results for  input(s): GLUCAP in the last 168 hours. Lipid Profile: No results for input(s): CHOL, HDL, LDLCALC, TRIG, CHOLHDL, LDLDIRECT in the last 72 hours. Thyroid Function Tests: No results for input(s): TSH, T4TOTAL, FREET4, T3FREE, THYROIDAB in the last 72 hours. Anemia Panel: No results for input(s): VITAMINB12, FOLATE, FERRITIN, TIBC, IRON, RETICCTPCT in the last 72 hours. Urine analysis:    Component Value Date/Time   COLORURINE YELLOW 02/11/2018 1808   APPEARANCEUR CLEAR 02/11/2018 1808   LABSPEC 1.006 02/11/2018 1808   PHURINE 7.0 02/11/2018 1808   GLUCOSEU 50 (A) 02/11/2018 1808   HGBUR LARGE (A) 02/11/2018 1808   BILIRUBINUR NEGATIVE 02/11/2018 1808   BILIRUBINUR NEG 01/25/2013 0947   KETONESUR NEGATIVE 02/11/2018 1808   PROTEINUR NEGATIVE 02/11/2018 1808   UROBILINOGEN 0.2 01/25/2013 0947   UROBILINOGEN 0.2 03/29/2009 1409   NITRITE NEGATIVE 02/11/2018 1808   LEUKOCYTESUR NEGATIVE 02/11/2018 1808    Radiological Exams on Admission: Ir Nephrostogram Right Thru Existing Access  Result Date: 03/18/2018 INDICATION: History of  cervical cancer now with ureteral leak. Patient underwent image guided placement of a right-sided percutaneous nephrostomy catheter on 03/02/2018 however experienced a persistent urinary leak and as such request has been made for placement of a new left-sided percutaneous nephrostomy catheter for urinary diversion purposes. Note, patient still has bilateral ureteral stents. EXAM: 1. FLUOROSCOPIC GUIDED PUNCTURE OF THE LEFT RENAL COLLECTING SYSTEM 2. LEFT PERCUTANEOUS NEPHROSTOMY TUBE PLACEMENT 3. RIGHT-SIDED ANTEGRADE NEPHROSTOGRAM COMPARISON:  CT abdomen pelvis-03/03/2018; image guided right-sided percutaneous nephrostomy catheter placement-03/02/2018 MEDICATIONS: Ciprofloxacin 400 mg IV;The antibiotic was administered in an appropriate time frame prior to skin puncture. ANESTHESIA/SEDATION: Moderate (conscious) sedation was employed during this procedure. A total of  Versed mg and Fentanyl mcg was administered intravenously. Moderate Sedation Time: minutes. The patient's level of consciousness and vital signs were monitored continuously by radiology nursing throughout the procedure under my direct supervision. CONTRAST:  A total of approximately 50 mL Isovue 300 administered into the bilateral collecting systems. FLUOROSCOPY TIME:  17 minutes 14 seconds (270.6 mGy) COMPLICATIONS: None immediate. PROCEDURE: The procedure, risks, benefits, and alternatives were explained to the patient. Questions regarding the procedure were encouraged and answered. The patient understands and consents to the procedure. A timeout was performed prior to the initiation of the procedure. Patient was positioned prone on the fluoroscopy table. The left frank as well as the right flank and external portion of the existing right-sided percutaneous nephrostomy catheter was prepped and draped in usual sterile fashion. Attention was initially paid to the patient's existing right-sided percutaneous nephrostomy catheter. Preprocedural spot fluoroscopic imaging was obtained of the right upper abdominal quadrant. Multiple spot fluoroscopic and radiographic images were obtained to the level of the urinary bladder. The right-sided nephrostomy catheter was reconnected to a gravity bag. _________________________________________________________ Attention was now paid towards placement of the left-sided percutaneous nephrostomy catheter Ultrasound scanning was performed of the left kidney in failed to delineate any significant left-sided pelvicaliectasis. Attempt was made to access a nondilated left renal collecting system over this ultimately proved unsuccessful As such, the superior coil of the left-sided ureteral stent was targeted fluoroscopically with a 21 gauge needle. Access to the collecting system was confirmed these injection of small amount of contrast. Next, a Nitrex wire was advanced into the left renal  pelvis and advanced the superior aspect of the left ureter. The needle was exchanged for the inner 3 French sheath of the Accustick set. Contrast injection was performed opacifying a non dilated posterior inferior calyx which was targeted fluoroscopically with a additional 21 gauge spinal needle. Access to the left renal collecting system was confirmed with advancement of a Nitrex wire into the left renal collecting system. Again, the access needle was exchanged for the inner 3 French sheath of an Accustick set under direct fluoroscopic guidance. Contrast injection confirmed appropriate positioning. Next, the track was dilated ultimately allowing placement of a 10 French percutaneous nephrostomy catheter over a short Amplatz wire with end coiled and locked within left renal pelvis. Note is made of a diminutive left renal collecting system. Contrast was injected and several spot fluoroscopic images were obtained in various obliquities confirming access. The catheter was secured at the skin with a Prolene retention suture and a gravity bag was placed. A dressing was placed. The patient tolerated procedure well without immediate postprocedural complication. FINDINGS: Preprocedural spot fluoroscopic image demonstrates unchanged positioning of right-sided percutaneous nephrostomy catheter with end coiled and locked overlying the expected location of the right renal pelvis. The superior end of a right-sided double-J ureteral stent  also overlies the expected location of the right renal pelvis. Contrast injection demonstrates brisk opacification of the right renal pelvis with passage of contrast both through and alongside the patient's right-sided double-J ureteral catheter. Unfortunately, there is contrast from the level of the urinary bladder/distal ureter into the vagina and ultimately to the level of the perineum. _________________________________________________________ Ultrasound scanning demonstrates no significant  dilatation of left renal collecting system. As such, a left-sided nephrostomy catheter was placed via a non dilated posterior inferior calyx utilizing a 2 step puncture technique as detailed above, ultimately allowing placement of a 10 French percutaneous nephrostomy catheter with end coiled and locked within the diminutive left renal pelvis. Contrast injection confirmed appropriate positioning. Delayed imaging of the pelvis demonstrated continued spillage of contrast to the level of the perineum, findings compatible with bilateral ureterovaginal fistulas. IMPRESSION: 1. Successful fluoroscopic guided placement of a left sided 10 French percutaneous nephrostomy catheter. 2. Appropriately positioned and functioning right-sided 10 French percutaneous nephrostomy catheter. No exchange performed. 3. Antegrade nephrostogram demonstrates persistent findings of bilateral ureterovaginal fistulas. PLAN: - Consideration for removal of patient's bilateral ureteral stents to be determined by Dr. Louis Meckel next week. - Consideration for Foley catheter placement in the setting of persistent symptomatic leak could be considered as deemed appropriate. Electronically Signed   By: Sandi Mariscal M.D.   On: 03/18/2018 17:08   Ir Nephrostomy Placement Left  Result Date: 03/18/2018 INDICATION: History of cervical cancer now with ureteral leak. Patient underwent image guided placement of a right-sided percutaneous nephrostomy catheter on 03/02/2018 however experienced a persistent urinary leak and as such request has been made for placement of a new left-sided percutaneous nephrostomy catheter for urinary diversion purposes. Note, patient still has bilateral ureteral stents. EXAM: 1. FLUOROSCOPIC GUIDED PUNCTURE OF THE LEFT RENAL COLLECTING SYSTEM 2. LEFT PERCUTANEOUS NEPHROSTOMY TUBE PLACEMENT 3. RIGHT-SIDED ANTEGRADE NEPHROSTOGRAM COMPARISON:  CT abdomen pelvis-03/03/2018; image guided right-sided percutaneous nephrostomy catheter  placement-03/02/2018 MEDICATIONS: Ciprofloxacin 400 mg IV;The antibiotic was administered in an appropriate time frame prior to skin puncture. ANESTHESIA/SEDATION: Moderate (conscious) sedation was employed during this procedure. A total of Versed mg and Fentanyl mcg was administered intravenously. Moderate Sedation Time: minutes. The patient's level of consciousness and vital signs were monitored continuously by radiology nursing throughout the procedure under my direct supervision. CONTRAST:  A total of approximately 50 mL Isovue 300 administered into the bilateral collecting systems. FLUOROSCOPY TIME:  17 minutes 14 seconds (671.2 mGy) COMPLICATIONS: None immediate. PROCEDURE: The procedure, risks, benefits, and alternatives were explained to the patient. Questions regarding the procedure were encouraged and answered. The patient understands and consents to the procedure. A timeout was performed prior to the initiation of the procedure. Patient was positioned prone on the fluoroscopy table. The left frank as well as the right flank and external portion of the existing right-sided percutaneous nephrostomy catheter was prepped and draped in usual sterile fashion. Attention was initially paid to the patient's existing right-sided percutaneous nephrostomy catheter. Preprocedural spot fluoroscopic imaging was obtained of the right upper abdominal quadrant. Multiple spot fluoroscopic and radiographic images were obtained to the level of the urinary bladder. The right-sided nephrostomy catheter was reconnected to a gravity bag. _________________________________________________________ Attention was now paid towards placement of the left-sided percutaneous nephrostomy catheter Ultrasound scanning was performed of the left kidney in failed to delineate any significant left-sided pelvicaliectasis. Attempt was made to access a nondilated left renal collecting system over this ultimately proved unsuccessful As such, the  superior coil of the left-sided ureteral stent  was targeted fluoroscopically with a 21 gauge needle. Access to the collecting system was confirmed these injection of small amount of contrast. Next, a Nitrex wire was advanced into the left renal pelvis and advanced the superior aspect of the left ureter. The needle was exchanged for the inner 3 French sheath of the Accustick set. Contrast injection was performed opacifying a non dilated posterior inferior calyx which was targeted fluoroscopically with a additional 21 gauge spinal needle. Access to the left renal collecting system was confirmed with advancement of a Nitrex wire into the left renal collecting system. Again, the access needle was exchanged for the inner 3 French sheath of an Accustick set under direct fluoroscopic guidance. Contrast injection confirmed appropriate positioning. Next, the track was dilated ultimately allowing placement of a 10 French percutaneous nephrostomy catheter over a short Amplatz wire with end coiled and locked within left renal pelvis. Note is made of a diminutive left renal collecting system. Contrast was injected and several spot fluoroscopic images were obtained in various obliquities confirming access. The catheter was secured at the skin with a Prolene retention suture and a gravity bag was placed. A dressing was placed. The patient tolerated procedure well without immediate postprocedural complication. FINDINGS: Preprocedural spot fluoroscopic image demonstrates unchanged positioning of right-sided percutaneous nephrostomy catheter with end coiled and locked overlying the expected location of the right renal pelvis. The superior end of a right-sided double-J ureteral stent also overlies the expected location of the right renal pelvis. Contrast injection demonstrates brisk opacification of the right renal pelvis with passage of contrast both through and alongside the patient's right-sided double-J ureteral catheter.  Unfortunately, there is contrast from the level of the urinary bladder/distal ureter into the vagina and ultimately to the level of the perineum. _________________________________________________________ Ultrasound scanning demonstrates no significant dilatation of left renal collecting system. As such, a left-sided nephrostomy catheter was placed via a non dilated posterior inferior calyx utilizing a 2 step puncture technique as detailed above, ultimately allowing placement of a 10 French percutaneous nephrostomy catheter with end coiled and locked within the diminutive left renal pelvis. Contrast injection confirmed appropriate positioning. Delayed imaging of the pelvis demonstrated continued spillage of contrast to the level of the perineum, findings compatible with bilateral ureterovaginal fistulas. IMPRESSION: 1. Successful fluoroscopic guided placement of a left sided 10 French percutaneous nephrostomy catheter. 2. Appropriately positioned and functioning right-sided 10 French percutaneous nephrostomy catheter. No exchange performed. 3. Antegrade nephrostogram demonstrates persistent findings of bilateral ureterovaginal fistulas. PLAN: - Consideration for removal of patient's bilateral ureteral stents to be determined by Dr. Louis Meckel next week. - Consideration for Foley catheter placement in the setting of persistent symptomatic leak could be considered as deemed appropriate. Electronically Signed   By: Sandi Mariscal M.D.   On: 03/18/2018 17:08   Assessment/Plan 1.  S/P left PCN. We will admit for pain control. Continue IV morphine as well as PRN Norco. Monitor overnight. IR will follow up on tomorrow. Most likely patient can be discharged home tomorrow.  2.Active Problems:   Cervical cancer [facility]) Patient follow-up with medical oncology as well as OB/GYN.  3.  Type 2 diabetes mellitus. Not on insulin. No complication. On metformin at home, will hold currently. On sensitive sliding  scale.  Nutrition: Carb modified diet DVT Prophylaxis: mechanical compression device  Advance goals of care discussion: full code   Consults: IR  Family Communication: no family was present at bedside, at the time of interview.  Disposition: Admitted as observation, tmed-surge unit. Likely  to be discharged home, in 1 days.  Author: Berle Mull, MD [facility] 03/18/2018  If 7PM-7AM, please contact night-coverage [email or web address]

## 2018-03-18 NOTE — H&P (Signed)
Chief Complaint: Patient was seen in consultation today for ureterovaginal fistula.  Referring Physician(s): Cross,Melissa D  Supervising Physician: Sandi Mariscal  Patient Status: [facility] - Out-pt  History of Present Illness: Amy Morrow is a 57 y.o. female with a past medical history of hyperlipidemia, anemia, and of squamous cell carcinoma of the cervix s/p robotic assisted radical hysterectomy with upper vaginectomy and pelvic lymphadenectomy on 02/08/2018. Unfortunately, she subsequently developed bilateral ureterovaginal fistulas, right greater than left, for which she has bilateral ureteral stents in place. She is known to IR and has had procedures with [place]. She underwent a right percutaneous nephrostomy tube placement 03/02/2018 with Dr. Kathlene Cote.  IR requested by Joylene John, NP for possible image-guided percutaneous left nephrostomy tube placement. Patient awake and alert sitting in bed with no complaints at this time. Accompanied by interpreter. Denies fever, chills, chest pain, dyspnea, abdominal pain, dizziness, or headache.   Past Medical History:  Diagnosis Date  . Abnormal Pap smear   . Anemia   . Cervical cancer [facility]) oncologist- dr Denman George   Stage IB1  SCCa   . Hyperlipidemia   . Urgency of urination    intermittant    Past Surgical History:  Procedure Laterality Date  . CYSTOSCOPY W/ RETROGRADES Bilateral 02/16/2018   Procedure: CYSTOSCOPY WITH RETROGRADE PYELOGRAM BILATERAL DIAGNOSTIC URETEROSCOPY, BILATERAL  STENT PLACEMENT;  Surgeon: Ardis Hughs, MD;  Location: [facility] ORS;  Service: Urology;  Laterality: Bilateral;  . D & C LEEP CONIZATION BX  07-31-2003   dr p. rose  [facility]  . IR NEPHROSTOMY PLACEMENT RIGHT  03/02/2018  . PELVIC LYMPH NODE DISSECTION Bilateral 02/08/2018   Procedure: BILATEAL PEVIC  LYMPHADENECTOMY;  Surgeon: [name], MD;  Location: [facility] ORS;  Service: Gynecology;  Laterality: Bilateral;  . ROBOTIC ASSISTED TOTAL HYSTERECTOMY WITH BILATERAL  SALPINGO OOPHERECTOMY N/A 02/08/2018   Procedure: XI ROBOTIC ASSISTED TOTAL Radical HYSTERECTOMY WITH BILATERAL SALPINGO OOPHORECTOMY;  Surgeon: [name], MD;  Location: [facility] ORS;  Service: Gynecology;  Laterality: N/A;  . Transvaginal tape placement  03-12-2009  dr Emeterio Reeve  [facility]   GYNECARE TENSION-FREE VAGINAL TAPE SLING  . TUBAL LIGATION  05-28-2005  DR  MARSHALL  @ [facility]   PPTL    Allergies: Patient has no known allergies.  Medications: Prior to Admission medications   Medication Sig Start Date End Date Taking? Authorizing Provider  acetaminophen (TYLENOL) 500 MG tablet Take 500 mg by mouth every 6 (six) hours as needed for fever.    [provider]  metFORMIN (GLUCOPHAGE XR) 500 MG 24 hr tablet Take 1 tablet (500 mg total) by mouth daily with breakfast. 02/23/18   [name], [name]  nitrofurantoin (MACRODANTIN) 100 MG capsule Take 1 capsule (100 mg total) by mouth at bedtime. 03/12/18   Cross, Lenna Sciara D, NP  polyethylene glycol (MIRALAX / GLYCOLAX) packet Take 17 g by mouth daily. Patient taking differently: Take 17 g by mouth daily as needed.  02/18/18   [name], MD     Family History  Problem Relation Age of Onset  . Hypertension Mother   . Heart disease Mother   . Hypertension Brother   . Heart disease Brother     Social History   Socioeconomic History  . Marital status: Married    Spouse name: Not on file  . Number of children: Not on file  . Years of education: Not on file  . Highest education level: Not on file  Occupational History  . Not on file  Social Needs  . Financial resource strain: Not on file  . Food insecurity:    Worry: Not on file    Inability: Not on file  . Transportation needs:    Medical: Not on file    Non-medical: Not on file  Tobacco Use  . Smoking status: Never Smoker  . Smokeless tobacco: Never Used  Substance and Sexual Activity  . Alcohol use: No  . Drug use: No  . Sexual activity: Yes    Birth control/protection:  None, Surgical  Lifestyle  . Physical activity:    Days per week: Not on file    Minutes per session: Not on file  . Stress: Not on file  Relationships  . Social connections:    Talks on phone: Not on file    Gets together: Not on file    Attends religious service: Not on file    Active member of club or organization: Not on file    Attends meetings of clubs or organizations: Not on file    Relationship status: Not on file  Other Topics Concern  . Not on file  Social History Narrative  . Not on file     Review of Systems: A 12 point ROS discussed and pertinent positives are indicated in the HPI above.  All other systems are negative.  Review of Systems  Constitutional: Negative for chills and fever.  Respiratory: Negative for shortness of breath and wheezing.   Cardiovascular: Negative for chest pain and palpitations.  Neurological: Negative for dizziness and headaches.  Psychiatric/Behavioral: Negative for behavioral problems and confusion.    Vital Signs: LMP 11/16/2012   Physical Exam  Constitutional: She is oriented to person, place, and time. She appears well-developed and well-nourished. No distress.  Cardiovascular: Normal rate, regular rhythm and normal heart sounds.  No murmur heard. Pulmonary/Chest: Effort normal and breath sounds normal. No respiratory distress. She has no wheezes.  Neurological: She is alert and oriented to person, place, and time.  Skin: Skin is warm and dry.  Psychiatric: She has a normal mood and affect. Her behavior is normal. Judgment and thought content normal.  Nursing note and vitals reviewed.    MD Evaluation Airway: WNL Heart: WNL Abdomen: WNL Chest/ Lungs: WNL ASA  Classification: 3 Mallampati/Airway Score: Two   Imaging: Ct Abdomen Pelvis W Wo Contrast  Result Date: 03/03/2018 CLINICAL DATA:  Persistent right flank pain and progressive anemia status post right percutaneous nephrostomy. New diagnosis of cervical cancer.  EXAM: CT ABDOMEN AND PELVIS WITHOUT AND WITH CONTRAST TECHNIQUE: Multidetector CT imaging of the abdomen and pelvis was performed following the standard protocol before and following the bolus administration of intravenous contrast. CONTRAST:  11mL ISOVUE-300 IOPAMIDOL (ISOVUE-300) INJECTION 61% COMPARISON:  CT 02/16/2018. FINDINGS: Lower chest: New trace bilateral pleural effusions with dependent airspace opacities at both lung bases, likely atelectasis. Hepatobiliary: Pre contrast images demonstrate decreased hepatic density consistent with steatosis. There is stable ill-defined low-density in segment for the liver, likely focal fat. No new or enlarging hepatic lesions. No evidence of gallstones, gallbladder wall thickening or biliary dilatation. Pancreas: Unremarkable. No pancreatic ductal dilatation or surrounding inflammatory changes. Spleen: Normal in size without focal abnormality. Adrenals/Urinary Tract: Both adrenal glands appear normal. The right percutaneous nephrostomy appears well positioned. Bilateral nephroureteral catheters are unchanged in position from recent radiographs. There is a small amount of air within the right renal collecting system. No evidence of urinary tract calculus. There is no perinephric hematoma. There is mild ureteral dilatation bilaterally.  Complex fluid collection adjacent to the vaginal cuff has decreased in size, now measuring approximately 2.6 x 8.0 cm transverse on image 133/8. There is minimal residual air in this collection. Delayed post-contrast images demonstrate bilateral renal excretion, but no significant contrast in the ureters, bladder or the pelvic fluid collection. There is mild bladder wall thickening. Stomach/Bowel: No evidence of bowel wall thickening, distention or surrounding inflammatory change. The appendix appears normal. Vascular/Lymphatic: There are no enlarged abdominal or pelvic lymph nodes. Mild aortic and branch vessel atherosclerosis. Retroaortic  left renal vein. Reproductive: Hysterectomy. No adnexal mass. As above, the complex pelvic fluid collection and surrounding inflammatory changes in the pelvis have improved. No new or enlarging collections. Other: Postsurgical changes in the anterior abdominal wall. No free air. Musculoskeletal: No acute or significant osseous findings. Stable lumbar spine degenerative changes. IMPRESSION: 1. Interval improvement in complex pelvic fluid collection and surrounding inflammatory changes status post bilateral nephroureteral catheter and right percutaneous nephrostomy placement. 2. No evidence of perinephric hematoma or other signs of acute bleeding. 3. Mild bilateral hydroureter, improved from preoperative CT. There is bilateral excretion of contrast into the renal collecting systems, but no significant opacification of the ureters or bladder. 4. New dependent opacities at both lung bases, likely atelectasis. Electronically Signed   By: [name]   On: 03/03/2018 13:03   Dg Chest 1 View  Result Date: 03/02/2018 CLINICAL DATA:  Chest pain EXAM: CHEST  1 VIEW COMPARISON:  02/11/2018 FINDINGS: Low lung volumes. Patchy atelectasis at the left base. No pleural effusion. Stable cardiomediastinal silhouette with minimal central congestion. No pneumothorax. IMPRESSION: Low lung volumes with minimal central congestion and patchy atelectasis at the left base Electronically Signed   By: [name]   On: 03/02/2018 19:59   Dg Abd 1 View  Result Date: 03/03/2018 CLINICAL DATA:  Right-sided abdominal pain. Right-sided nephrostomy tube placement yesterday. EXAM: ABDOMEN - 1 VIEW COMPARISON:  CT 02/16/2018. FINDINGS: Bilateral nephroureteral stents in place. Additionally right-sided nephrostomy tube in place. Normal bowel gas pattern. No bowel dilatation to suggest obstruction. Moderate stool in the ascending, distal transverse and descending colon. No evidence of free air on supine views. IMPRESSION:  Bilateral ureteral stents in place. Right nephrostomy tube in place. Normal bowel gas pattern. Electronically Signed   By: [name]   On: 03/03/2018 04:10   Ct Abdomen Pelvis W Contrast  Addendum Date: 02/16/2018   ADDENDUM REPORT: 02/16/2018 18:28 ADDENDUM: The original report was by Dr. Van Clines. The following addendum is by Dr. Van Clines: The clinical team called me at about 5:40 p.m. Eastern time on February 16, 2018 and we discussed the above findings and impressions in detail. Electronically Signed   By: Van Clines M.D.   On: 02/16/2018 18:28   Result Date: 02/16/2018 CLINICAL DATA:  Drainage from vagina, query ureteral/bladder injury or fistula. EXAM: CT ABDOMEN AND PELVIS WITH CONTRAST TECHNIQUE: Multidetector CT imaging of the abdomen and pelvis was performed using the standard protocol following bolus administration of intravenous contrast. CONTRAST:  1108mL ISOVUE-300 IOPAMIDOL (ISOVUE-300) INJECTION 61% COMPARISON:  Cystogram dated 02/16/2018; PET-CT from 02/03/2018 FINDINGS: Lower chest: Unremarkable Hepatobiliary: Hypodensity in segment 4 of the liver near the falciform ligament was not recently hypermetabolic and probably represents focal fatty infiltration. The liver and gallbladder appear otherwise unremarkable. No biliary dilatation. Pancreas: Unremarkable Spleen: Unremarkable Adrenals/Urinary Tract: Adrenal glands normal. Bilateral mild hydroureter and mild left hydronephrosis. There is leakage of contrast medium from the distal ureter vicinity,  probably on the left side, into a collection of gas, fluid, and contrast just above the vaginal cuff. This is actively leaking from the ureter. This cystogram from earlier today did not appear to demonstrate leakage of contrast from the bladder, and accordingly the source of the leak is probably ureteral. The collection of extravasated ureteral contrast, gas, and fluid measures about 8.6 by 4.3 by 4.9 cm just above  the vaginal cuff, but also with some separate enhancing pockets including a 3.8 by 2.0 by 2.1 cm probable abscess near the rectosigmoid junction on image 19/5, as well as a 6.6 by 3.3 by 5.8 cm collection of gas and fluid tracking along the left pelvic sidewall. The source of the gas could be infection, or a defect in the vaginal cuff. Either way, this is a set up for infection and abscess formation, and the lesion along the rectosigmoid junction appears to likely be a walled off abscess. I would not normally expected postoperative gas to still be present 8 days out after laparoscopy in the pelvis; the gas that is present is considered abnormal. Stomach/Bowel: There is potentially some secondary wall thickening of the rectosigmoid adjacent to the small abscess. I am skeptical of rectosigmoid as a source for the infection. Appendix normal. Vascular/Lymphatic: Mild abdominal aortic atherosclerotic calcification. Reproductive: Uterus absent.  Ovaries not well seen. Other: No supplemental non-categorized findings. Musculoskeletal: Mild left foraminal stenosis at L5-S1 due to facet and intervertebral spurring. Broad Schmorl's node along the superior endplate of L4. IMPRESSION: 1. Active leak of urinary tract contrast into a collection of fluid and gas just above the vaginal cuff. I favor the contrast leak is coming from the left distal ureter, less likely to be from the right distal ureter. Given the appearance on the earlier cystogram today, the bladder is not thought to be the source of the leak. Irregular collections of fluid and gas in the pelvis suggesting early abscess formation, this gas indicates either infection or leak a coach of gas through the vaginal cuff (which in turn predisposes to infection). This gas is not normal postoperative gas 8 days out after laparoscopy. An encapsulated smaller abscess is present adjacent to the sigmoid colon. 2. Mild bilateral hydroureter and mild left hydronephrosis. This may  be due to extrinsic mass effect and inflammation along the distal ureters. 3.  Aortoiliac atherosclerotic vascular disease. 4. Left foraminal impingement at L5-S1 due to spurring. Electronically Signed: By: Van Clines M.D. On: 02/16/2018 17:55   Dg Cystogram  Result Date: 02/16/2018 CLINICAL DATA:  57 year old female post hysterectomy. Vaginal leak. Initial encounter. EXAM: CYSTOGRAM TECHNIQUE: Patient came to radiology suite catheterized. Urinary bladder was filled with 210 mL Cysto-Hypaque 30% by drip infusion. Serial spot images were obtained during bladder filling and post draining. FLUOROSCOPY TIME:  Fluoroscopy Time:  1 minutes and 6 seconds. Radiation Exposure Index: 39 mGy. COMPARISON:  02/03/2018 PET-CT. FINDINGS: Mild irregularity of the posterior aspect of bladder wall may represent postoperative changes. No urinary bladder leak identified. Contrast drained. IMPRESSION: No urinary bladder leak identified. Irregularity of the posterior aspect of bladder may be related to postoperative changes. Please see follow-up CT report. Electronically Signed   By: Genia Del M.D.   On: 02/16/2018 18:18   US Renal  Result Date: 03/16/2018 CLINICAL DATA:  Ureteral vaginal fistula. Assess for hydronephrosis. EXAM: RENAL / URINARY TRACT ULTRASOUND COMPLETE COMPARISON:  CT 03/03/2018 FINDINGS: Right Kidney: Length: 10.6 cm. Nephrostomy tube in place. No hydronephrosis. No focal parenchymal abnormality. Left Kidney:  Length: 12.0 cm. Double-J ureteral stent in place on the left. No hydronephrosis. No renal parenchymal lesion is seen. Bladder: Bilateral stents in place. No primary bladder finding. Bladder is collapsed. IMPRESSION: No hydronephrosis on the left. Double-J ureteral stent in place on the left. Right nephrostomy and stent. Electronically Signed   By: Nelson Chimes M.D.   On: 03/16/2018 16:38   Dg Chest Port 1 View  Result Date: 03/04/2018 CLINICAL DATA:  Sepsis. EXAM: PORTABLE CHEST 1 VIEW  COMPARISON:  03/02/2018. FINDINGS: Poor inspiration. Normal sized heart. Mildly prominent interstitial markings. Mild patchy density in both lower lung zones. Unremarkable bones. IMPRESSION: 1. Poor inspiration with mild patchy atelectasis or pneumonia at both lung bases with minimal progression. 2. Mild chronic interstitial lung disease. Electronically Signed   By: Claudie Revering M.D.   On: 03/04/2018 08:54   Dg C-arm 1-60 Min-no Report  Result Date: 02/16/2018 Fluoroscopy was utilized by the requesting physician.  No radiographic interpretation.   Ir Nephrostomy Placement Right  Result Date: 03/02/2018 CLINICAL DATA:  Cervical carcinoma with postoperative uretero-vaginal fistulas and visualization of right ureteral stent in the vagina. Right percutaneous nephrostomy tube requested for urinary diversion. There is extravasation at the time of retrograde studies at the level of the distal left ureter as well. Bilateral ureteral stents are currently in place. EXAM: 1. ULTRASOUND GUIDANCE FOR PUNCTURE OF THE RIGHT RENAL COLLECTING SYSTEM. 2. RIGHT PERCUTANEOUS NEPHROSTOMY TUBE PLACEMENT. COMPARISON:  CT of the abdomen and pelvis on 02/16/2018 ANESTHESIA/SEDATION: 3.0 mg IV Versed; 100 mcg IV Fentanyl. Total Moderate Sedation Time 23 minutes. The patient's level of consciousness and physiologic status were continuously monitored during the procedure by Radiology nursing. CONTRAST:  20 mL Isovue-300 MEDICATIONS: 2 g IV Ancef. Antibiotic was administered in an appropriate time frame prior to skin puncture. FLUOROSCOPY TIME:  1 minutes and 18 seconds.  16.6 mGy. PROCEDURE: The procedure, risks, benefits, and alternatives were explained to the patient. Questions regarding the procedure were encouraged and answered. The patient understands and consents to the procedure. A time-out was performed prior to initiating the procedure. Both kidneys were inspected by ultrasound. The right flank region was prepped with  chlorhexidine in a sterile fashion, and a sterile drape was applied covering the operative field. A sterile gown and sterile gloves were used for the procedure. Local anesthesia was provided with 1% Lidocaine. Ultrasound was used to localize the right kidney. Under direct ultrasound guidance, a 21 gauge needle was advanced into the renal collecting system. Ultrasound image documentation was performed. Aspiration of urine sample was performed followed by contrast injection. A transitional dilator was advanced over a guidewire. Percutaneous tract dilatation was then performed over the guidewire. A 10-French percutaneous nephrostomy tube was then advanced and formed in the collecting system. Catheter position was confirmed by fluoroscopy after contrast injection. The catheter was attached to a gravity bag and secured at the skin with a Prolene retention suture and Stat-Lock device. COMPLICATIONS: None FINDINGS: Ultrasound demonstrates mild to moderate right-sided hydronephrosis. There is no significant left hydronephrosis. A 10 French nephrostomy tube was placed on the right and formed at the level of the renal pelvis. IMPRESSION: Right-sided percutaneous nephrostomy tube placement. A 10 French catheter was placed and formed in the renal pelvis. This will be left to gravity bag drainage. Ultrasound demonstrates no evidence of left-sided hydronephrosis currently. Attempt at placing a left percutaneous nephrostomy tube was deferred today. Left nephrostomy tube placement would be very difficult without some degree of hydronephrosis. Electronically Signed  By: Aletta Edouard M.D.   On: 03/02/2018 15:36    Labs:  CBC: Recent Labs    03/03/18 1033 03/03/18 1335 03/04/18 0326 03/05/18 0325  WBC 14.6* 14.2* 10.3 8.1  HGB 7.5* 8.1* 7.3* 8.6*  HCT 23.1* 24.9* 22.4* 25.5*  PLT 257 285 266 300    COAGS: Recent Labs    03/02/18 0952  INR 0.90    BMP: Recent Labs    03/02/18 1641 03/03/18 0315  03/04/18 0326 03/05/18 0325  NA 139 138 140 142  K 3.0* 3.3* 3.3* 3.4*  CL 106 110 110 105  CO2 26 24 26 28   GLUCOSE 115* 146* 100* 86  BUN 13 11 6  5*  CALCIUM 8.2* 7.3* 7.8* 8.4*  CREATININE 0.53 0.85 0.52 0.53  GFRNONAA >60 >60 >60 >60  GFRAA >60 >60 >60 >60    LIVER FUNCTION TESTS: Recent Labs    02/11/18 1818 02/16/18 1212 02/17/18 0352 03/02/18 1641  BILITOT 1.0 0.5 0.4 0.9  AST 28 79* 54* 40  ALT 26 197* 150* 27  ALKPHOS 65 306* 244* 178*  PROT 6.7 7.7 7.3 6.2*  ALBUMIN 3.1* 3.1* 3.2* 2.9*    TUMOR MARKERS: No results for input(s): AFPTM, CEA, CA199, CHROMGRNA in the last 8760 hours.  Assessment and Plan:  Ureterovaginal fistula. Plan for image-guided percutaneous left nephrostomy tube placement today with Dr. Pascal Lux. Patient is NPO. Denies fever. She does not take blood thinners. INR pending. Plan to admit for overnight observation following procedure, as requested by Joylene John, NP.  Risks and benefits of procedure were discussed with the patient/spouse via interpreter including, but not limited to, infection, bleeding, significant bleeding causing loss or decrease in renal function or damage to adjacent structures.  All of the patient's questions were answered, patient is agreeable to proceed. Consent signed and in chart.   Thank you for this interesting consult.  I greatly enjoyed meeting Amy Morrow and look forward to participating in their care.  A copy of this report was sent to the requesting provider on this date.  Electronically Signed: Earley Abide, [name] 03/18/2018, 1:32 PM   I spent a total of 25 Minutes in face to face in clinical consultation, greater than 50% of which was counseling/coordinating care for ureterovaginal fistula.

## 2018-03-18 NOTE — Telephone Encounter (Signed)
Patient is scheduled for 03/24/18 at [place] office left patient a message with appointment details. August 21,2019 at 1pm arrive by 12:40 at [place]. Nat Christen, CMA

## 2018-03-18 NOTE — Procedures (Signed)
Pre Procedure Dx: Ureteral leak Post Procedural Dx: Same  Successful fluoroscopic guided placement of a left sided sided PCN with end coiled and locked within the renal pelvis. PCN connected to gravity bag.  Right sided antegrade nephrostogram demonstrates the right sided PCN is appropriately positioned and normally functioning.   EBL: Minimal  Complications: None immediate.  Ronny Bacon, MD Pager #: [phone]

## 2018-03-19 DIAGNOSIS — N821 Other female urinary-genital tract fistulae: Secondary | ICD-10-CM | POA: Diagnosis not present

## 2018-03-19 DIAGNOSIS — T83032A Leakage of nephrostomy catheter, initial encounter: Secondary | ICD-10-CM | POA: Diagnosis not present

## 2018-03-19 LAB — GLUCOSE, CAPILLARY
GLUCOSE-CAPILLARY: 93 mg/dL (ref 70–99)
Glucose-Capillary: 90 mg/dL (ref 70–99)
Glucose-Capillary: 84 mg/dL (ref 70–99)

## 2018-03-19 LAB — COMPREHENSIVE METABOLIC PANEL
ALT: 13 U/L (ref 0–44)
ANION GAP: 7 (ref 5–15)
AST: 18 U/L (ref 15–41)
Albumin: 3.2 g/dL — ABNORMAL LOW (ref 3.5–5.0)
Alkaline Phosphatase: 77 U/L (ref 38–126)
BUN: 11 mg/dL (ref 6–20)
CHLORIDE: 103 mmol/L (ref 98–111)
CO2: 28 mmol/L (ref 22–32)
Calcium: 8.8 mg/dL — ABNORMAL LOW (ref 8.9–10.3)
Creatinine, Ser: 0.58 mg/dL (ref 0.44–1.00)
GFR calc Af Amer: >60 mL/min (ref >60)
GFR calc non Af Amer: >60 mL/min (ref >60)
GLUCOSE: 99 mg/dL (ref 70–99)
POTASSIUM: 3.7 mmol/L (ref 3.5–5.1)
Sodium: 138 mmol/L (ref 135–145)
Total Bilirubin: 0.8 mg/dL (ref 0.3–1.2)
Total Protein: 6.6 g/dL (ref 6.5–8.1)

## 2018-03-19 LAB — CBC
HCT: 28.9 % — ABNORMAL LOW (ref 36.0–46.0)
HEMOGLOBIN: 9.4 g/dL — AB (ref 12.0–15.0)
MCH: 29.6 pg (ref 26.0–34.0)
MCHC: 32.5 g/dL (ref 30.0–36.0)
MCV: 90.9 fL (ref 78.0–100.0)
Platelets: 416 10*3/uL — ABNORMAL HIGH (ref 150–400)
RBC: 3.18 MIL/uL — ABNORMAL LOW (ref 3.87–5.11)
RDW: 14 % (ref 11.5–15.5)
WBC: 5.1 10*3/uL (ref 4.0–10.5)

## 2018-03-19 MED ORDER — DOCUSATE SODIUM 100 MG PO CAPS: 100.0000 mg | ORAL_CAPSULE | Freq: Two times a day (BID) | ORAL | 0 refills | Status: DC

## 2018-03-19 MED ORDER — POLYETHYLENE GLYCOL 3350 17 G PO PACK: 17.0000 g | PACK | Freq: Every day | ORAL | 0 refills | Status: DC

## 2018-03-19 MED ORDER — HYDROCODONE-ACETAMINOPHEN 5-325 MG PO TABS
1.0000 | ORAL_TABLET | Freq: Three times a day (TID) | ORAL | 0 refills | Status: AC | PRN
Start: 2018-03-19 — End: 2018-03-22

## 2018-03-19 MED ORDER — ONDANSETRON HCL 4 MG PO TABS: 4.0000 mg | ORAL_TABLET | Freq: Four times a day (QID) | ORAL | 0 refills | Status: DC | PRN

## 2018-03-19 NOTE — Progress Notes (Signed)
Patient resting quietly in bed eating breakfast.  No nausea or emesis reported.  Concerned about blood noted in the tubing of both nephrostomies.  Not reporting significant discomfort.  Asking how long the tubes will need to be in.  Reporting decrease in vaginal drainage.  All questions answered.  No needs voiced.  She will follow up in the office on Sept 4 with Dr. Denman George or sooner if needed.  She is advised to call for any needs.

## 2018-03-19 NOTE — Progress Notes (Signed)
Chief Complaint: Patient was seen today for (B)PCN  Referring Physician(s): Vivian D  Supervising Physician: Aletta Edouard  Patient Status: [facility] - In-pt  Subjective: S/p placement of new (L)PCN yesterday. Admitted for observation. Denies much discomfort  Objective: Physical Exam: BP 131/80 (BP Location: Left Arm)   Pulse 77   Temp 98.5 F (36.9 C) (Oral)   Resp 16   Ht 4\' 11"  (1.499 m)   LMP 11/16/2012   SpO2 97%   BMI 28.07 kg/m  (B)PCN intact, sites clean, NT Output serosanguinous from both, no clots.   Current Facility-Administered Medications:  .  0.9 %  sodium chloride infusion, , Intravenous, Continuous, Lavina Hamman, MD, Last Rate: 50 mL/hr at 03/18/18 2145 .  acetaminophen (TYLENOL) tablet 650 mg, 650 mg, Oral, Q6H PRN **OR** acetaminophen (TYLENOL) suppository 650 mg, 650 mg, Rectal, Q6H PRN, Lavina Hamman, MD .  docusate sodium (COLACE) capsule 100 mg, 100 mg, Oral, BID, Lavina Hamman, MD, 100 mg at 03/19/18 0957 .  HYDROcodone-acetaminophen (NORCO/VICODIN) 5-325 MG per tablet 1 tablet, 1 tablet, Oral, Q6H PRN, Lavina Hamman, MD .  insulin aspart (novoLOG) injection 0-5 Units, 0-5 Units, Subcutaneous, QHS, Patel, Josetta Huddle, MD .  insulin aspart (novoLOG) injection 0-9 Units, 0-9 Units, Subcutaneous, TID WC, Lavina Hamman, MD .  morphine 4 MG/ML injection 2 mg, 2 mg, Intravenous, Q4H PRN, Lavina Hamman, MD, 2 mg at 03/18/18 2009 .  nitrofurantoin (MACRODANTIN) capsule 100 mg, 100 mg, Oral, QHS, Lavina Hamman, MD .  ondansetron [facility]) tablet 4 mg, 4 mg, Oral, Q6H PRN **OR** ondansetron (ZOFRAN) injection 4 mg, 4 mg, Intravenous, Q6H PRN, Lavina Hamman, MD, 4 mg at 03/18/18 2137 .  polyethylene glycol (MIRALAX / GLYCOLAX) packet 17 g, 17 g, Oral, Daily, Lavina Hamman, MD, 17 g at 03/19/18 0957 .  sodium chloride flush (NS) 0.9 % injection 5 mL, 5 mL, Intracatheter, Q8H, Sandi Mariscal, MD, 5 mL at 03/18/18 2149  Labs: CBC Recent Labs     03/18/18 1733 03/19/18 0415  WBC 6.5 5.1  HGB 9.5* 9.4*  HCT 29.8* 28.9*  PLT 462* 416*   BMET Recent Labs    03/18/18 1733 03/19/18 0415  NA 140 138  K 3.3* 3.7  CL 104 103  CO2 29 28  GLUCOSE 110* 99  BUN 12 11  CREATININE 0.57 0.58  CALCIUM 8.9 8.8*   LFT Recent Labs    03/19/18 0415  PROT 6.6  ALBUMIN 3.2*  AST 18  ALT 13  ALKPHOS 77  BILITOT 0.8   PT/INR Recent Labs    03/18/18 1319  LABPROT 13.1  INR 1.00     Studies/Results: Ir Nephrostogram Right Thru Existing Access  Result Date: 03/18/2018 INDICATION: History of cervical cancer now with ureteral leak. Patient underwent image guided placement of a right-sided percutaneous nephrostomy catheter on 03/02/2018 however experienced a persistent urinary leak and as such request has been made for placement of a new left-sided percutaneous nephrostomy catheter for urinary diversion purposes. Note, patient still has bilateral ureteral stents. EXAM: 1. FLUOROSCOPIC GUIDED PUNCTURE OF THE LEFT RENAL COLLECTING SYSTEM 2. LEFT PERCUTANEOUS NEPHROSTOMY TUBE PLACEMENT 3. RIGHT-SIDED ANTEGRADE NEPHROSTOGRAM COMPARISON:  CT abdomen pelvis-03/03/2018; image guided right-sided percutaneous nephrostomy catheter placement-03/02/2018 MEDICATIONS: Ciprofloxacin 400 mg IV;The antibiotic was administered in an appropriate time frame prior to skin puncture. ANESTHESIA/SEDATION: Moderate (conscious) sedation was employed during this procedure. A total of Versed mg and Fentanyl mcg was administered intravenously. Moderate Sedation  Time: minutes. The patient's level of consciousness and vital signs were monitored continuously by radiology nursing throughout the procedure under my direct supervision. CONTRAST:  A total of approximately 50 mL Isovue 300 administered into the bilateral collecting systems. FLUOROSCOPY TIME:  17 minutes 14 seconds (027.7 mGy) COMPLICATIONS: None immediate. PROCEDURE: The procedure, risks, benefits, and  alternatives were explained to the patient. Questions regarding the procedure were encouraged and answered. The patient understands and consents to the procedure. A timeout was performed prior to the initiation of the procedure. Patient was positioned prone on the fluoroscopy table. The left frank as well as the right flank and external portion of the existing right-sided percutaneous nephrostomy catheter was prepped and draped in usual sterile fashion. Attention was initially paid to the patient's existing right-sided percutaneous nephrostomy catheter. Preprocedural spot fluoroscopic imaging was obtained of the right upper abdominal quadrant. Multiple spot fluoroscopic and radiographic images were obtained to the level of the urinary bladder. The right-sided nephrostomy catheter was reconnected to a gravity bag. _________________________________________________________ Attention was now paid towards placement of the left-sided percutaneous nephrostomy catheter Ultrasound scanning was performed of the left kidney in failed to delineate any significant left-sided pelvicaliectasis. Attempt was made to access a nondilated left renal collecting system over this ultimately proved unsuccessful As such, the superior coil of the left-sided ureteral stent was targeted fluoroscopically with a 21 gauge needle. Access to the collecting system was confirmed these injection of small amount of contrast. Next, a Nitrex wire was advanced into the left renal pelvis and advanced the superior aspect of the left ureter. The needle was exchanged for the inner 3 French sheath of the Accustick set. Contrast injection was performed opacifying a non dilated posterior inferior calyx which was targeted fluoroscopically with a additional 21 gauge spinal needle. Access to the left renal collecting system was confirmed with advancement of a Nitrex wire into the left renal collecting system. Again, the access needle was exchanged for the inner 3  French sheath of an Accustick set under direct fluoroscopic guidance. Contrast injection confirmed appropriate positioning. Next, the track was dilated ultimately allowing placement of a 10 French percutaneous nephrostomy catheter over a short Amplatz wire with end coiled and locked within left renal pelvis. Note is made of a diminutive left renal collecting system. Contrast was injected and several spot fluoroscopic images were obtained in various obliquities confirming access. The catheter was secured at the skin with a Prolene retention suture and a gravity bag was placed. A dressing was placed. The patient tolerated procedure well without immediate postprocedural complication. FINDINGS: Preprocedural spot fluoroscopic image demonstrates unchanged positioning of right-sided percutaneous nephrostomy catheter with end coiled and locked overlying the expected location of the right renal pelvis. The superior end of a right-sided double-J ureteral stent also overlies the expected location of the right renal pelvis. Contrast injection demonstrates brisk opacification of the right renal pelvis with passage of contrast both through and alongside the patient's right-sided double-J ureteral catheter. Unfortunately, there is contrast from the level of the urinary bladder/distal ureter into the vagina and ultimately to the level of the perineum. _________________________________________________________ Ultrasound scanning demonstrates no significant dilatation of left renal collecting system. As such, a left-sided nephrostomy catheter was placed via a non dilated posterior inferior calyx utilizing a 2 step puncture technique as detailed above, ultimately allowing placement of a 10 French percutaneous nephrostomy catheter with end coiled and locked within the diminutive left renal pelvis. Contrast injection confirmed appropriate positioning. Delayed imaging of the pelvis  demonstrated continued spillage of contrast to the  level of the perineum, findings compatible with bilateral ureterovaginal fistulas. IMPRESSION: 1. Successful fluoroscopic guided placement of a left sided 10 French percutaneous nephrostomy catheter. 2. Appropriately positioned and functioning right-sided 10 French percutaneous nephrostomy catheter. No exchange performed. 3. Antegrade nephrostogram demonstrates persistent findings of bilateral ureterovaginal fistulas. PLAN: - Consideration for removal of patient's bilateral ureteral stents to be determined by Dr. Louis Meckel next week. - Consideration for Foley catheter placement in the setting of persistent symptomatic leak could be considered as deemed appropriate. Electronically Signed   By: Sandi Mariscal M.D.   On: 03/18/2018 17:08   Ir Nephrostomy Placement Left  Result Date: 03/18/2018 INDICATION: History of cervical cancer now with ureteral leak. Patient underwent image guided placement of a right-sided percutaneous nephrostomy catheter on 03/02/2018 however experienced a persistent urinary leak and as such request has been made for placement of a new left-sided percutaneous nephrostomy catheter for urinary diversion purposes. Note, patient still has bilateral ureteral stents. EXAM: 1. FLUOROSCOPIC GUIDED PUNCTURE OF THE LEFT RENAL COLLECTING SYSTEM 2. LEFT PERCUTANEOUS NEPHROSTOMY TUBE PLACEMENT 3. RIGHT-SIDED ANTEGRADE NEPHROSTOGRAM COMPARISON:  CT abdomen pelvis-03/03/2018; image guided right-sided percutaneous nephrostomy catheter placement-03/02/2018 MEDICATIONS: Ciprofloxacin 400 mg IV;The antibiotic was administered in an appropriate time frame prior to skin puncture. ANESTHESIA/SEDATION: Moderate (conscious) sedation was employed during this procedure. A total of Versed mg and Fentanyl mcg was administered intravenously. Moderate Sedation Time: minutes. The patient's level of consciousness and vital signs were monitored continuously by radiology nursing throughout the procedure under my direct  supervision. CONTRAST:  A total of approximately 50 mL Isovue 300 administered into the bilateral collecting systems. FLUOROSCOPY TIME:  17 minutes 14 seconds (761.6 mGy) COMPLICATIONS: None immediate. PROCEDURE: The procedure, risks, benefits, and alternatives were explained to the patient. Questions regarding the procedure were encouraged and answered. The patient understands and consents to the procedure. A timeout was performed prior to the initiation of the procedure. Patient was positioned prone on the fluoroscopy table. The left frank as well as the right flank and external portion of the existing right-sided percutaneous nephrostomy catheter was prepped and draped in usual sterile fashion. Attention was initially paid to the patient's existing right-sided percutaneous nephrostomy catheter. Preprocedural spot fluoroscopic imaging was obtained of the right upper abdominal quadrant. Multiple spot fluoroscopic and radiographic images were obtained to the level of the urinary bladder. The right-sided nephrostomy catheter was reconnected to a gravity bag. _________________________________________________________ Attention was now paid towards placement of the left-sided percutaneous nephrostomy catheter Ultrasound scanning was performed of the left kidney in failed to delineate any significant left-sided pelvicaliectasis. Attempt was made to access a nondilated left renal collecting system over this ultimately proved unsuccessful As such, the superior coil of the left-sided ureteral stent was targeted fluoroscopically with a 21 gauge needle. Access to the collecting system was confirmed these injection of small amount of contrast. Next, a Nitrex wire was advanced into the left renal pelvis and advanced the superior aspect of the left ureter. The needle was exchanged for the inner 3 French sheath of the Accustick set. Contrast injection was performed opacifying a non dilated posterior inferior calyx which was  targeted fluoroscopically with a additional 21 gauge spinal needle. Access to the left renal collecting system was confirmed with advancement of a Nitrex wire into the left renal collecting system. Again, the access needle was exchanged for the inner 3 French sheath of an Accustick set under direct fluoroscopic guidance. Contrast injection confirmed appropriate positioning.  Next, the track was dilated ultimately allowing placement of a 10 French percutaneous nephrostomy catheter over a short Amplatz wire with end coiled and locked within left renal pelvis. Note is made of a diminutive left renal collecting system. Contrast was injected and several spot fluoroscopic images were obtained in various obliquities confirming access. The catheter was secured at the skin with a Prolene retention suture and a gravity bag was placed. A dressing was placed. The patient tolerated procedure well without immediate postprocedural complication. FINDINGS: Preprocedural spot fluoroscopic image demonstrates unchanged positioning of right-sided percutaneous nephrostomy catheter with end coiled and locked overlying the expected location of the right renal pelvis. The superior end of a right-sided double-J ureteral stent also overlies the expected location of the right renal pelvis. Contrast injection demonstrates brisk opacification of the right renal pelvis with passage of contrast both through and alongside the patient's right-sided double-J ureteral catheter. Unfortunately, there is contrast from the level of the urinary bladder/distal ureter into the vagina and ultimately to the level of the perineum. _________________________________________________________ Ultrasound scanning demonstrates no significant dilatation of left renal collecting system. As such, a left-sided nephrostomy catheter was placed via a non dilated posterior inferior calyx utilizing a 2 step puncture technique as detailed above, ultimately allowing placement of  a 10 French percutaneous nephrostomy catheter with end coiled and locked within the diminutive left renal pelvis. Contrast injection confirmed appropriate positioning. Delayed imaging of the pelvis demonstrated continued spillage of contrast to the level of the perineum, findings compatible with bilateral ureterovaginal fistulas. IMPRESSION: 1. Successful fluoroscopic guided placement of a left sided 10 French percutaneous nephrostomy catheter. 2. Appropriately positioned and functioning right-sided 10 French percutaneous nephrostomy catheter. No exchange performed. 3. Antegrade nephrostogram demonstrates persistent findings of bilateral ureterovaginal fistulas. PLAN: - Consideration for removal of patient's bilateral ureteral stents to be determined by Dr. Louis Meckel next week. - Consideration for Foley catheter placement in the setting of persistent symptomatic leak could be considered as deemed appropriate. Electronically Signed   By: Sandi Mariscal M.D.   On: 03/18/2018 17:08    Assessment/Plan: Ureterovaginal fistula. S/p (L)PCN Now has bilateral PCN for urinary diversion. Expect mild hematuria to clear over the next few days. Discussed with pt. She is familiar with care of her PCNs. OK to DC from IR standpoint. F/u and plans moving fwd per [facility].     LOS: 1 day   I spent a total of 15 minutes in face to face in clinical consultation, greater than 50% of which was counseling/coordinating care for (B)PCN  Ascencion Dike [name] 03/19/2018 10:10 AM

## 2018-03-19 NOTE — Discharge Instructions (Signed)
Nefrostoma percutnea: gua para el hogar (Percutaneous Nephrostomy Home Guide) Un tubo de nefrostoma permite eliminar la orina cuando una afeccin impide que el rin la elimine normalmente. La orina es transportada normalmente desde los riones hasta la vejiga a travs de tubos estrechos llamados urteres. El urter se puede obstruir debido a afecciones como clculos renales, tumores, infecciones o cogulos sanguneos. Un tubo de nefrostoma es un tubo hueco y flexible que se coloca en el rin para restaurar el flujo de [place]. El tubo se coloca del lado derecho o izquierdo, en la parte inferior de la [place], y se conecta a una bolsa de drenaje externa. HIGIENE PERSONAL  Puede ducharse, a menos que el mdico le haya indicado lo contrario. Para hacerlo, recubra el vendaje del tubo de nefrostoma con [place] de plstico.  Despus de ducharse, cambie el vendaje de inmediato. Asegrese de que la piel alrededor del punto de salida del tubo de nefrostoma est seca.  Evite sumergir el tubo de nefrostoma en agua, como tomar un bao de inmersin o Designer, television/film set. CUIDADOS DEL TUBO DE NEFROSTOMA  El tubo de nefrostoma se conecta a una bolsa que se sujeta a la pierna o a una bolsa de drenaje que est al costado de la cama. Mantenga siempre el tubo, la bolsa de la pierna o la bolsa de drenaje nocturna por debajo del nivel del rin, para que la orina drene fcilmente.  Sus actividades no se ven limitadas, siempre y cuando no tire del tubo.  [place], si conecta el tubo de nefrostoma a una bolsa en la pierna, asegrese de que el tubo no est doblado y que la orina drene con facilidad. Una tcnica til para evitar que el tubo de nefrostoma se doble o desconecte es colocar una venda elstica alrededor del tubo. Esto ayudar a Designer, industrial/product. Asegrese de que no haya tensin en el tubo de modo que no se desconecte.  Por la noche, posiblemente quiera conectar el tubo de  nefrostoma a una bolsa de drenaje nocturna ms grande ubicada al costado de la cama. CMO VACIAR LA BOLSA EN LA PIERNA O LA BOLSA DE DRENAJE NOCTURNA  La bolsa de la pierna o la bolsa de drenaje nocturna se deben vaciar cuando se llenan y antes de irse a dormir. La mayora de las bolsas tiene un tubo de drenaje en la parte inferior que permite que la orina sea excretada. 1. Sostenga la bolsa de la pierna o la bolsa de drenaje nocturna por encima del inodoro o recipiente de recoleccin. Use un recipiente medidor si recibi instrucciones de [employer]. 2. Sherlon Handing el tubo de drenaje y permita que la orina drene. 3. Una vez que se haya vaciado la bolsa, cierre el tubo de drenaje completamente para evitar que se filtre la [place]. 4. Tire la cadena del inodoro. Si se [place] un recipiente de recoleccin, enjuguelo. CUIDADOS DEL PUNTO DE SALIDA DEL TUBO DE NEFROSTOMA El punto de salida del tubo de nefrostoma se cubre con un apsito (vendaje). Limpie el punto de salida y [place] el vendaje, segn las indicaciones del mdico o si el vendaje se moja. Materiales necesarios:  Jabn suave y [place].  Gasas divididas de 4 x 4pulgadas (10 x 10cm).  Gasas de 4 x 4pulgadas (10 x 10cm).  Cinta adhesiva de papel. Cuidados del punto de salida y cambio de vendajes: durante las 2 primeras semanas luego de la insercin del tubo de [place], debe cambiar el vendaje todos [place].  Despus de 2semanas, puede cambiar el vendaje 2 veces por semana, cuando se moje o segn las indicaciones del mdico. Debido a la ubicacin del tubo de [place], probablemente necesite la ayuda de otra persona para Quarry manager los vendajes. Los pasos para cambiar un vendaje son los siguientes: 1. Lvese bien las manos con agua y [place]. 2. Retire con cuidado las cintas y los vendajes de alrededor del tubo de nefrostoma. Tenga cuidado de no tirar del tubo mientras retira los vendajes. Evite usar tijeras para retirar el vendaje, ya que se  podra daar accidentalmente el tubo. 3. Lave la piel alrededor del tubo con el jabn [place] y [place], enjuague bien y seque con un pao limpio. 4. Inspeccione la piel alrededor del tubo de drenaje para asegurarse de que no haya enrojecimiento, hinchazn ni una secrecin de color amarillo o verde de mal olor. 5. Si el drenaje est suturado a la piel, inspeccione la sutura para verificar que todava est sujetado a la piel. 6. [employer] gasas divididas en el punto de salida del tubo y alrededor de [place]. No aplique ungentos ni alcohol en el lugar. 7. Coloque una gasa comn encima de la gasa dividida. 8. Enrosque el tubo por encima de la gasa. El tubo debe apoyarse en la gasa y no en la piel. 9. Coloque cinta alrededor de cada extremo de la gasa. 10. Fije el tubo de nefrostoma. Asegrese de que no est doblado ni pinzado. El tubo debe apoyarse en la gasa y no en la piel. 11. Deseche adecuadamente los materiales usados. CMO PURGAR EL TUBO DE NEFROSTOMA  Purgue el tubo de nefrostoma segn las indicaciones del mdico. Purgar un tubo de nefrostoma es ms fcil si se coloca una llave de tres vas entre el tubo y la bolsa de la pierna o la bolsa de drenaje nocturna. Una de las conexiones de la llave de tres vas se conecta al tubo, la segunda se conecta a la bolsa de la pierna o a la bolsa de drenaje nocturna, y la tercera conexin generalmente se sella con un tapn. El mango de la llave de tres vas apunta hacia el flujo cerrado de la llave. Normalmente, el mango apunta hacia el tapn para permitir el drenaje de la orina desde el tubo hasta la bolsa de la pierna o la bolsa de drenaje nocturna. Materiales necesarios:  Pao con alcohol para fricciones.  Jeringa con 20ml de solucin salina al 0,9%. Cmo purgar el tubo de nefrostoma 1. Rena los materiales necesarios. 2. Mueva el mango de la llave de tres vas hasta que apunte hacia la bolsa de la pierna o a la bolsa de drenaje nocturna. 3. Limpie el  tapn con un pao embebido en alcohol para fricciones y luego enrosque la punta de [place] con 61ml de solucin salina al 0,9% en la tapa. 4. Use el mbolo de la [place] y administre los 51ml de solucin salina al 0,9% de la jeringa durante 5 a 10segundos. Si siente resistencia o dolor durante la administracin, suspndala inmediatamente. 5. Retire la jeringa del tapn. 6. Regrese el mango de la llave a su posicin habitual, es decir, apuntando [employer] tapn. 7. Deseche adecuadamente los materiales usados. CMO REEMPLAZAR LA BOLSA DE LA PIERNA O LA BOLSA DE DRENAJE NOCTURNA  Reemplace la bolsa de la pierna, la bolsa de drenaje nocturna, la llave de tres vas y Higher education careers adviser tubo de extensin segn las indicaciones del mdico. Asegrese de tener siempre disponibles una bolsa de drenaje y tubos de conexin  adicionales. 1. Vace la orina de la bolsa de la pierna o la bolsa de drenaje nocturna. 2. Rena la bolsa de la pierna o la bolsa de drenaje nocturna, la llave de tres vas y los tubos de extensin nuevos. 3. Retire la bolsa de la pierna o la bolsa de drenaje nocturna, la llave de tres vas y cualquier tubo de extensin del tubo de nefrostoma. 4. Conecte la bolsa de la pierna o la bolsa de drenaje nocturna, la llave de tres vas y cualquier tubo de extensin nuevos al tubo de nefrostoma. 5. Deseche la bolsa de la pierna o la bolsa de drenaje nocturna, la llave de tres vas y cualquier tubo de extensin del tubo de nefrostoma que estn usados. SOLICITE ATENCIN MDICA DE INMEDIATO SI:  Tiene fiebre.  Tiene dolor abdominal durante la primera semana despus de la insercin del tubo de nefrostoma.  Observa nuevamente, sangre en la orina.  Observa secrecin, enrojecimiento o hinchazn en el lugar de la insercin del tubo de nefrostoma.  Tiene dificultad o dolor al purgar el tubo.  Nota una disminucin en la diuresis que no tiene explicacin ya que no disminuy la cantidad de lquido que  bebe.  El tubo de nefrostoma se sale o la sutura que fija el tubo se suelta. Esta informacin no tiene Marine scientist el consejo del mdico. Asegrese de hacerle al mdico cualquier pregunta que tenga. Document Released: 05/11/2013 Document Revised: 07/26/2013 Elsevier Interactive Patient Education  2017 [place].  Nefrolitotoma percutnea, cuidados posteriores (Percutaneous Nephrolithotomy, Care After) Siga estas instrucciones durante las prximas semanas. Estas indicaciones le proporcionan informacin acerca de cmo deber cuidarse despus del procedimiento. El mdico tambin podr darle instrucciones ms especficas. El tratamiento ha sido planificado segn las prcticas mdicas actuales, pero en algunos casos pueden ocurrir problemas. Comunquese con el mdico si tiene algn problema o dudas despus del procedimiento. QU ESPERAR DESPUS DEL PROCEDIMIENTO Despus del procedimiento, es comn [employer] siguientes sntomas:  Sensibilidad o Social research officer, government.  Fatiga.  Rastros de [employer] en [place]. INSTRUCCIONES PARA EL CUIDADO EN EL HOGAR Cuidado de la incisin  Siga las indicaciones del mdico acerca de cmo cuidar el corte (incisin) que se realiz durante la [name]. Haga lo siguiente: ? Lvese las manos con agua y jabn antes de Quarry manager las vendas (vendaje). Use desinfectante para manos si no dispone de [place] y [place]. ? Cambie el vendaje como se lo haya indicado el mdico. ? No retire los puntos (suturas), el [employer] para la piel o las tiras [place]. Es posible que estos deban quedar puestos en la piel durante 2semanas o ms tiempo. Si los bordes de las tiras adhesivas empiezan a despegarse y Therapist, sports, puede recortar los que estn sueltos. No retire las tiras [facility] por completo a menos que el mdico se lo indique.  [place] zona de la incisin para detectar signos de infeccin. Est atento a los siguientes signos: ? Aumento del  enrojecimiento, la hinchazn o Conservation officer, historic buildings. ? Ms lquido Delorise Shiner. ? Calor. ? Pus o mal olor. Actividad  Evite las actividades extenuantes durante el tiempo que le haya indicado el mdico.  Reanude sus actividades normales como se lo haya indicado el mdico. Pregntele al mdico qu actividades son seguras para usted. Instrucciones generales  [employer] de venta libre y los recetados solamente como se lo haya indicado el mdico.  No conduzca ni opere maquinaria pesada mientras toma analgsicos recetados.  Concurra a [place]  de control como se lo haya indicado el mdico. Esto es importante.  Quizs regrese a casa con un catter o un tubo de drenaje renal. Si esto ocurre, siga cuidadosamente las instrucciones del mdico acerca de los cuidados de [employer]. SOLICITE ATENCIN MDICA SI:  Jaclynn Guarneri.  Aumentan el enrojecimiento, la hinchazn o el dolor alrededor de la incisin.  Le sale ms lquido o sangre de la incisin.  La incisin est caliente al tacto.  Tiene pus o percibe que sale mal olor del lugar de la incisin.  Pierde el apetito.  Siente nuseas o vomita. SOLICITE ATENCIN MDICA DE INMEDIATO SI:  Hay cogulos de sangre en la orina.  No puede orinar.  Tiene dolor en el pecho o dificultad para respirar. Esta informacin no tiene Marine scientist el consejo del mdico. Asegrese de hacerle al mdico cualquier pregunta que tenga. Document Released: 08/17/2015 Document Revised: 08/17/2015 Document Reviewed: 01/08/2015 Elsevier Interactive Patient Education  [employer].

## 2018-03-23 NOTE — Discharge Summary (Signed)
[facility] Discharge Summary   Patient: Amy Morrow GHW:[id number]   PCP: Patient, No Pcp Per DOB: Jul 27, 1961   Date of admission: 03/18/2018   Date of discharge: 03/19/2018    Discharge Diagnoses:  Active Problems:   Cervical cancer ([place])   Admitted From: home Disposition:  home  Recommendations for Outpatient Follow-up:  1. Please follow up with PCP   Follow-up Information    [name], MD. Schedule an appointment as soon as possible for a visit in 1 week(s).   Specialty:  Gynecologic Oncology Contact information: [facility] [address] [phone]          Diet recommendation: regular diet  Activity: The patient is advised to gradually reintroduce usual activities.  Discharge Condition: good  Code Status: full code  History of present illness: As per the H and P dictated on admission, "Amy Morrow is a 57 y.o. female with Past medical history of cervical cancer S/P radical hysterectomy and pelvic lymphadenectomy, ureteral vaginal fistula, type II DM, recent right PCN placement followed by sepsis needing ICU admission. Spanish interpreter was used for communication with the patient.  Patient presents to the IR for elective left PCN.  Due to her prior history of severe pain as well as severe sepsis after placement of right PCN oncology and interventional radiology requested the patient be admitted to the hospital for overnight observation. At the time of my evaluation patient does not have any other acute complaint other than left-sided pain where PCI was performed.  No nausea no vomiting.  No diarrhea.  She actually has constipation at home.  No abdominal pain no chest pain.  No shortness of breath.  No headache no dizziness no fever at home. No recent change in medications reported as well.  Per IR patient tolerated the procedure very well and did not have any acute complication.  At her baseline ambulates without support And is  independent for most of her ADL; manages her medication on her own."  Hospital Course:  Summary of her active problems in the hospital is as following. 1.  S/P left PCN. admit for pain control. Continue home pain regimen  2.Active Problems:   Cervical cancer [facility]) Patient follow-up with medical oncology as well as OB/GYN.  3.  Type 2 diabetes mellitus. Not on insulin. No complication. On metformin at home,   All other chronic medical condition were stable during the hospitalization.  Patient was ambulatory without any assistance. On the day of the discharge the patient's vitals were stable , and no other acute medical condition were reported by patient. the patient was felt safe to be discharge at home with family.  Consultants: IR Gyn oncology Procedures: PCN  DISCHARGE MEDICATION: Allergies as of 03/19/2018   No Known Allergies     Medication List    TAKE these medications   acetaminophen 500 MG tablet Commonly known as:  TYLENOL Take 500 mg by mouth every 6 (six) hours as needed for fever.   docusate sodium 100 MG capsule Commonly known as:  COLACE Take 1 capsule (100 mg total) by mouth 2 (two) times daily.   metFORMIN 500 MG 24 hr tablet Commonly known as:  GLUCOPHAGE-XR Take 1 tablet (500 mg total) by mouth daily with breakfast.   nitrofurantoin 100 MG capsule Commonly known as:  MACRODANTIN Take 1 capsule (100 mg total) by mouth at bedtime.   ondansetron 4 MG tablet Commonly known as:  ZOFRAN Take 1 tablet (4 mg total)  by mouth every 6 (six) hours as needed for nausea.   polyethylene glycol packet Commonly known as:  MIRALAX / GLYCOLAX Take 17 g by mouth daily. What changed:    when to take this  reasons to take this     ASK your doctor about these medications   HYDROcodone-acetaminophen 5-325 MG tablet Commonly known as:  NORCO/VICODIN Take 1 tablet by mouth 3 (three) times daily as needed for up to 3 days for moderate pain. Ask about: Should  I take this medication?      No Known Allergies Discharge Instructions    Diet - low sodium heart healthy   Complete by:  As directed    Discharge instructions   Complete by:  As directed    It is important that you read following instructions as well as go over your medication list with RN to help you understand your care after this hospitalization.  Discharge Instructions: Please follow-up with PCP in one week  Please request your primary care physician to go over all Hospital Tests and Procedure/Radiological results at the follow up,  Please get all Hospital records sent to your PCP by signing hospital release before you go home.   Do not drive, operating heavy machinery, perform activities at heights, swimming or participation in water activities or provide baby sitting services while you are on Pain, Sleep and Anxiety Medications; until you have been seen by Primary Care Physician or a Neurologist and advised to do so again. Do not take more than prescribed Pain, Sleep and Anxiety Medications. You were cared for by a hospitalist during your hospital stay. If you have any questions about your discharge medications or the care you received while you were in the hospital after you are discharged, you can call the unit and ask to speak with the hospitalist on call if the hospitalist that took care of you is not available.  Once you are discharged, your primary care physician will handle any further medical issues. Please note that NO REFILLS for any discharge medications will be authorized once you are discharged, as it is imperative that you return to your primary care physician (or establish a relationship with a primary care physician if you do not have one) for your aftercare needs so that they can reassess your need for medications and monitor your lab values. You Must read complete instructions/literature along with all the possible adverse reactions/side effects for all the Medicines  you take and that have been prescribed to you. Take any new Medicines after you have completely understood and accept all the possible adverse reactions/side effects. Wear Seat belts while driving. If you have smoked or chewed Tobacco in the last 2 yrs please stop smoking and/or stop any Recreational drug use.   Increase activity slowly   Complete by:  As directed      Discharge Exam: There were no vitals filed for this visit. Vitals:   03/18/18 1630 03/18/18 1651  BP: 120/76 131/80  Pulse: 73 77  Resp: 16 16  Temp:  98.5 F (36.9 C)  SpO2: (!) 76% 97%   General: Appear in no distress, no Rash; Oral Mucosa moist. Cardiovascular: S1 and S2 Present, no Murmur, no JVD Respiratory: Bilateral Air entry present and Clear to Auscultation, no Crackles, no wheezes Abdomen: Bowel Sound present, Soft and no tenderness Extremities: no Pedal edema, no calf tenderness Neurology: Grossly no focal neuro deficit.no  The results of significant diagnostics from this hospitalization (including imaging, microbiology, ancillary and  laboratory) are listed below for reference.    Significant Diagnostic Studies: Ct Abdomen Pelvis W Wo Contrast  Result Date: 03/03/2018 CLINICAL DATA:  Persistent right flank pain and progressive anemia status post right percutaneous nephrostomy. New diagnosis of cervical cancer. EXAM: CT ABDOMEN AND PELVIS WITHOUT AND WITH CONTRAST TECHNIQUE: Multidetector CT imaging of the abdomen and pelvis was performed following the standard protocol before and following the bolus administration of intravenous contrast. CONTRAST:  158mL ISOVUE-300 IOPAMIDOL (ISOVUE-300) INJECTION 61% COMPARISON:  CT 02/16/2018. FINDINGS: Lower chest: New trace bilateral pleural effusions with dependent airspace opacities at both lung bases, likely atelectasis. Hepatobiliary: Pre contrast images demonstrate decreased hepatic density consistent with steatosis. There is stable ill-defined low-density in segment  for the liver, likely focal fat. No new or enlarging hepatic lesions. No evidence of gallstones, gallbladder wall thickening or biliary dilatation. Pancreas: Unremarkable. No pancreatic ductal dilatation or surrounding inflammatory changes. Spleen: Normal in size without focal abnormality. Adrenals/Urinary Tract: Both adrenal glands appear normal. The right percutaneous nephrostomy appears well positioned. Bilateral nephroureteral catheters are unchanged in position from recent radiographs. There is a small amount of air within the right renal collecting system. No evidence of urinary tract calculus. There is no perinephric hematoma. There is mild ureteral dilatation bilaterally. Complex fluid collection adjacent to the vaginal cuff has decreased in size, now measuring approximately 2.6 x 8.0 cm transverse on image 133/8. There is minimal residual air in this collection. Delayed post-contrast images demonstrate bilateral renal excretion, but no significant contrast in the ureters, bladder or the pelvic fluid collection. There is mild bladder wall thickening. Stomach/Bowel: No evidence of bowel wall thickening, distention or surrounding inflammatory change. The appendix appears normal. Vascular/Lymphatic: There are no enlarged abdominal or pelvic lymph nodes. Mild aortic and branch vessel atherosclerosis. Retroaortic left renal vein. Reproductive: Hysterectomy. No adnexal mass. As above, the complex pelvic fluid collection and surrounding inflammatory changes in the pelvis have improved. No new or enlarging collections. Other: Postsurgical changes in the anterior abdominal wall. No free air. Musculoskeletal: No acute or significant osseous findings. Stable lumbar spine degenerative changes. IMPRESSION: 1. Interval improvement in complex pelvic fluid collection and surrounding inflammatory changes status post bilateral nephroureteral catheter and right percutaneous nephrostomy placement. 2. No evidence of perinephric  hematoma or other signs of acute bleeding. 3. Mild bilateral hydroureter, improved from preoperative CT. There is bilateral excretion of contrast into the renal collecting systems, but no significant opacification of the ureters or bladder. 4. New dependent opacities at both lung bases, likely atelectasis. Electronically Signed   By: [name]   On: 03/03/2018 13:03   Dg Chest 1 View  Result Date: 03/02/2018 CLINICAL DATA:  Chest pain EXAM: CHEST  1 VIEW COMPARISON:  02/11/2018 FINDINGS: Low lung volumes. Patchy atelectasis at the left base. No pleural effusion. Stable cardiomediastinal silhouette with minimal central congestion. No pneumothorax. IMPRESSION: Low lung volumes with minimal central congestion and patchy atelectasis at the left base Electronically Signed   By: [name]   On: 03/02/2018 19:59   Dg Abd 1 View  Result Date: 03/03/2018 CLINICAL DATA:  Right-sided abdominal pain. Right-sided nephrostomy tube placement yesterday. EXAM: ABDOMEN - 1 VIEW COMPARISON:  CT 02/16/2018. FINDINGS: Bilateral nephroureteral stents in place. Additionally right-sided nephrostomy tube in place. Normal bowel gas pattern. No bowel dilatation to suggest obstruction. Moderate stool in the ascending, distal transverse and descending colon. No evidence of free air on supine views. IMPRESSION: Bilateral ureteral stents in place. Right nephrostomy tube  in place. Normal bowel gas pattern. Electronically Signed   By: [name]   On: 03/03/2018 04:10   US Renal  Result Date: 03/16/2018 CLINICAL DATA:  Ureteral vaginal fistula. Assess for hydronephrosis. EXAM: RENAL / URINARY TRACT ULTRASOUND COMPLETE COMPARISON:  CT 03/03/2018 FINDINGS: Right Kidney: Length: 10.6 cm. Nephrostomy tube in place. No hydronephrosis. No focal parenchymal abnormality. Left Kidney: Length: 12.0 cm. Double-J ureteral stent in place on the left. No hydronephrosis. No renal parenchymal lesion is seen. Bladder:  Bilateral stents in place. No primary bladder finding. Bladder is collapsed. IMPRESSION: No hydronephrosis on the left. Double-J ureteral stent in place on the left. Right nephrostomy and stent. Electronically Signed   By: Nelson Chimes M.D.   On: 03/16/2018 16:38   Dg Chest Port 1 View  Result Date: 03/04/2018 CLINICAL DATA:  Sepsis. EXAM: PORTABLE CHEST 1 VIEW COMPARISON:  03/02/2018. FINDINGS: Poor inspiration. Normal sized heart. Mildly prominent interstitial markings. Mild patchy density in both lower lung zones. Unremarkable bones. IMPRESSION: 1. Poor inspiration with mild patchy atelectasis or pneumonia at both lung bases with minimal progression. 2. Mild chronic interstitial lung disease. Electronically Signed   By: Claudie Revering M.D.   On: 03/04/2018 08:54   Ir Nephrostogram Right Thru Existing Access  Result Date: 03/18/2018 INDICATION: History of cervical cancer now with ureteral leak. Patient underwent image guided placement of a right-sided percutaneous nephrostomy catheter on 03/02/2018 however experienced a persistent urinary leak and as such request has been made for placement of a new left-sided percutaneous nephrostomy catheter for urinary diversion purposes. Note, patient still has bilateral ureteral stents. EXAM: 1. FLUOROSCOPIC GUIDED PUNCTURE OF THE LEFT RENAL COLLECTING SYSTEM 2. LEFT PERCUTANEOUS NEPHROSTOMY TUBE PLACEMENT 3. RIGHT-SIDED ANTEGRADE NEPHROSTOGRAM COMPARISON:  CT abdomen pelvis-03/03/2018; image guided right-sided percutaneous nephrostomy catheter placement-03/02/2018 MEDICATIONS: Ciprofloxacin 400 mg IV;The antibiotic was administered in an appropriate time frame prior to skin puncture. ANESTHESIA/SEDATION: Moderate (conscious) sedation was employed during this procedure. A total of Versed mg and Fentanyl mcg was administered intravenously. Moderate Sedation Time: minutes. The patient's level of consciousness and vital signs were monitored continuously by radiology nursing  throughout the procedure under my direct supervision. CONTRAST:  A total of approximately 50 mL Isovue 300 administered into the bilateral collecting systems. FLUOROSCOPY TIME:  17 minutes 14 seconds (263.7 mGy) COMPLICATIONS: None immediate. PROCEDURE: The procedure, risks, benefits, and alternatives were explained to the patient. Questions regarding the procedure were encouraged and answered. The patient understands and consents to the procedure. A timeout was performed prior to the initiation of the procedure. Patient was positioned prone on the fluoroscopy table. The left frank as well as the right flank and external portion of the existing right-sided percutaneous nephrostomy catheter was prepped and draped in usual sterile fashion. Attention was initially paid to the patient's existing right-sided percutaneous nephrostomy catheter. Preprocedural spot fluoroscopic imaging was obtained of the right upper abdominal quadrant. Multiple spot fluoroscopic and radiographic images were obtained to the level of the urinary bladder. The right-sided nephrostomy catheter was reconnected to a gravity bag. _________________________________________________________ Attention was now paid towards placement of the left-sided percutaneous nephrostomy catheter Ultrasound scanning was performed of the left kidney in failed to delineate any significant left-sided pelvicaliectasis. Attempt was made to access a nondilated left renal collecting system over this ultimately proved unsuccessful As such, the superior coil of the left-sided ureteral stent was targeted fluoroscopically with a 21 gauge needle. Access to the collecting system was confirmed these injection of small amount of  contrast. Next, a Nitrex wire was advanced into the left renal pelvis and advanced the superior aspect of the left ureter. The needle was exchanged for the inner 3 French sheath of the Accustick set. Contrast injection was performed opacifying a non dilated  posterior inferior calyx which was targeted fluoroscopically with a additional 21 gauge spinal needle. Access to the left renal collecting system was confirmed with advancement of a Nitrex wire into the left renal collecting system. Again, the access needle was exchanged for the inner 3 French sheath of an Accustick set under direct fluoroscopic guidance. Contrast injection confirmed appropriate positioning. Next, the track was dilated ultimately allowing placement of a 10 French percutaneous nephrostomy catheter over a short Amplatz wire with end coiled and locked within left renal pelvis. Note is made of a diminutive left renal collecting system. Contrast was injected and several spot fluoroscopic images were obtained in various obliquities confirming access. The catheter was secured at the skin with a Prolene retention suture and a gravity bag was placed. A dressing was placed. The patient tolerated procedure well without immediate postprocedural complication. FINDINGS: Preprocedural spot fluoroscopic image demonstrates unchanged positioning of right-sided percutaneous nephrostomy catheter with end coiled and locked overlying the expected location of the right renal pelvis. The superior end of a right-sided double-J ureteral stent also overlies the expected location of the right renal pelvis. Contrast injection demonstrates brisk opacification of the right renal pelvis with passage of contrast both through and alongside the patient's right-sided double-J ureteral catheter. Unfortunately, there is contrast from the level of the urinary bladder/distal ureter into the vagina and ultimately to the level of the perineum. _________________________________________________________ Ultrasound scanning demonstrates no significant dilatation of left renal collecting system. As such, a left-sided nephrostomy catheter was placed via a non dilated posterior inferior calyx utilizing a 2 step puncture technique as detailed  above, ultimately allowing placement of a 10 French percutaneous nephrostomy catheter with end coiled and locked within the diminutive left renal pelvis. Contrast injection confirmed appropriate positioning. Delayed imaging of the pelvis demonstrated continued spillage of contrast to the level of the perineum, findings compatible with bilateral ureterovaginal fistulas. IMPRESSION: 1. Successful fluoroscopic guided placement of a left sided 10 French percutaneous nephrostomy catheter. 2. Appropriately positioned and functioning right-sided 10 French percutaneous nephrostomy catheter. No exchange performed. 3. Antegrade nephrostogram demonstrates persistent findings of bilateral ureterovaginal fistulas. PLAN: - Consideration for removal of patient's bilateral ureteral stents to be determined by Dr. Louis Meckel next week. - Consideration for Foley catheter placement in the setting of persistent symptomatic leak could be considered as deemed appropriate. Electronically Signed   By: Sandi Mariscal M.D.   On: 03/18/2018 17:08   Ir Nephrostomy Placement Left  Result Date: 03/18/2018 INDICATION: History of cervical cancer now with ureteral leak. Patient underwent image guided placement of a right-sided percutaneous nephrostomy catheter on 03/02/2018 however experienced a persistent urinary leak and as such request has been made for placement of a new left-sided percutaneous nephrostomy catheter for urinary diversion purposes. Note, patient still has bilateral ureteral stents. EXAM: 1. FLUOROSCOPIC GUIDED PUNCTURE OF THE LEFT RENAL COLLECTING SYSTEM 2. LEFT PERCUTANEOUS NEPHROSTOMY TUBE PLACEMENT 3. RIGHT-SIDED ANTEGRADE NEPHROSTOGRAM COMPARISON:  CT abdomen pelvis-03/03/2018; image guided right-sided percutaneous nephrostomy catheter placement-03/02/2018 MEDICATIONS: Ciprofloxacin 400 mg IV;The antibiotic was administered in an appropriate time frame prior to skin puncture. ANESTHESIA/SEDATION: Moderate (conscious) sedation  was employed during this procedure. A total of Versed mg and Fentanyl mcg was administered intravenously. Moderate Sedation Time: minutes. The patient's level  of consciousness and vital signs were monitored continuously by radiology nursing throughout the procedure under my direct supervision. CONTRAST:  A total of approximately 50 mL Isovue 300 administered into the bilateral collecting systems. FLUOROSCOPY TIME:  17 minutes 14 seconds (937.1 mGy) COMPLICATIONS: None immediate. PROCEDURE: The procedure, risks, benefits, and alternatives were explained to the patient. Questions regarding the procedure were encouraged and answered. The patient understands and consents to the procedure. A timeout was performed prior to the initiation of the procedure. Patient was positioned prone on the fluoroscopy table. The left frank as well as the right flank and external portion of the existing right-sided percutaneous nephrostomy catheter was prepped and draped in usual sterile fashion. Attention was initially paid to the patient's existing right-sided percutaneous nephrostomy catheter. Preprocedural spot fluoroscopic imaging was obtained of the right upper abdominal quadrant. Multiple spot fluoroscopic and radiographic images were obtained to the level of the urinary bladder. The right-sided nephrostomy catheter was reconnected to a gravity bag. _________________________________________________________ Attention was now paid towards placement of the left-sided percutaneous nephrostomy catheter Ultrasound scanning was performed of the left kidney in failed to delineate any significant left-sided pelvicaliectasis. Attempt was made to access a nondilated left renal collecting system over this ultimately proved unsuccessful As such, the superior coil of the left-sided ureteral stent was targeted fluoroscopically with a 21 gauge needle. Access to the collecting system was confirmed these injection of small amount of contrast. Next,  a Nitrex wire was advanced into the left renal pelvis and advanced the superior aspect of the left ureter. The needle was exchanged for the inner 3 French sheath of the Accustick set. Contrast injection was performed opacifying a non dilated posterior inferior calyx which was targeted fluoroscopically with a additional 21 gauge spinal needle. Access to the left renal collecting system was confirmed with advancement of a Nitrex wire into the left renal collecting system. Again, the access needle was exchanged for the inner 3 French sheath of an Accustick set under direct fluoroscopic guidance. Contrast injection confirmed appropriate positioning. Next, the track was dilated ultimately allowing placement of a 10 French percutaneous nephrostomy catheter over a short Amplatz wire with end coiled and locked within left renal pelvis. Note is made of a diminutive left renal collecting system. Contrast was injected and several spot fluoroscopic images were obtained in various obliquities confirming access. The catheter was secured at the skin with a Prolene retention suture and a gravity bag was placed. A dressing was placed. The patient tolerated procedure well without immediate postprocedural complication. FINDINGS: Preprocedural spot fluoroscopic image demonstrates unchanged positioning of right-sided percutaneous nephrostomy catheter with end coiled and locked overlying the expected location of the right renal pelvis. The superior end of a right-sided double-J ureteral stent also overlies the expected location of the right renal pelvis. Contrast injection demonstrates brisk opacification of the right renal pelvis with passage of contrast both through and alongside the patient's right-sided double-J ureteral catheter. Unfortunately, there is contrast from the level of the urinary bladder/distal ureter into the vagina and ultimately to the level of the perineum. _________________________________________________________  Ultrasound scanning demonstrates no significant dilatation of left renal collecting system. As such, a left-sided nephrostomy catheter was placed via a non dilated posterior inferior calyx utilizing a 2 step puncture technique as detailed above, ultimately allowing placement of a 10 French percutaneous nephrostomy catheter with end coiled and locked within the diminutive left renal pelvis. Contrast injection confirmed appropriate positioning. Delayed imaging of the pelvis demonstrated continued spillage of contrast  to the level of the perineum, findings compatible with bilateral ureterovaginal fistulas. IMPRESSION: 1. Successful fluoroscopic guided placement of a left sided 10 French percutaneous nephrostomy catheter. 2. Appropriately positioned and functioning right-sided 10 French percutaneous nephrostomy catheter. No exchange performed. 3. Antegrade nephrostogram demonstrates persistent findings of bilateral ureterovaginal fistulas. PLAN: - Consideration for removal of patient's bilateral ureteral stents to be determined by Dr. Louis Meckel next week. - Consideration for Foley catheter placement in the setting of persistent symptomatic leak could be considered as deemed appropriate. Electronically Signed   By: Sandi Mariscal M.D.   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 Right percutaneous nephrostomy tube requested for urinary diversion. There is extravasation at the time of retrograde studies at the level of the distal left ureter as well. Bilateral ureteral stents are currently in place. EXAM: 1. ULTRASOUND GUIDANCE FOR PUNCTURE OF THE RIGHT RENAL COLLECTING SYSTEM. 2. RIGHT PERCUTANEOUS NEPHROSTOMY TUBE PLACEMENT. COMPARISON:  CT of the abdomen and pelvis on 02/16/2018 ANESTHESIA/SEDATION: 3.0 mg IV Versed; 100 mcg IV Fentanyl. Total  Moderate Sedation Time 23 minutes. The patient's level of consciousness and physiologic status were continuously monitored during the procedure by Radiology nursing. CONTRAST:  20 mL Isovue-300 MEDICATIONS: 2 g IV Ancef. Antibiotic was administered in an appropriate time frame prior to skin puncture. FLUOROSCOPY TIME:  1 minutes and 18 seconds.  16.6 mGy. PROCEDURE: The procedure, risks, benefits, and alternatives were explained to the patient. Questions regarding the procedure were encouraged and answered. The patient understands and consents to the procedure. A time-out was performed prior to initiating the procedure. Both kidneys were inspected by ultrasound. The right flank region was prepped with chlorhexidine in a sterile fashion, and a sterile drape was applied covering the operative field. A sterile gown and sterile gloves were used for the procedure. Local anesthesia was provided with 1% Lidocaine. Ultrasound was used to localize the right kidney. Under direct ultrasound guidance, a 21 gauge needle was advanced into the renal collecting system. Ultrasound image documentation was performed. Aspiration of urine sample was performed followed by contrast injection. A transitional dilator was advanced over a guidewire. Percutaneous tract dilatation was then performed over the guidewire. A 10-French percutaneous nephrostomy tube was then advanced and formed in the collecting system. Catheter position was confirmed by fluoroscopy after contrast injection. The catheter was attached to a gravity bag and secured at the skin with a Prolene retention suture and Stat-Lock device. COMPLICATIONS: None FINDINGS: Ultrasound demonstrates mild to moderate right-sided hydronephrosis. There is no significant left hydronephrosis. A 10 French nephrostomy tube was placed on the right and formed at the level of the renal pelvis. IMPRESSION: Right-sided percutaneous nephrostomy tube placement. A 10 French catheter was placed and  formed in the renal pelvis. This will be left to gravity bag drainage. Ultrasound demonstrates no evidence of left-sided hydronephrosis currently. Attempt at placing a left percutaneous nephrostomy tube was deferred today. Left nephrostomy tube placement would be very difficult without some degree of hydronephrosis. Electronically Signed   By: Aletta Edouard M.D.   On: 03/02/2018 15:36    Microbiology: No results found for this or any previous visit (from the past 240 hour(s)).   Labs: CBC: Recent Labs  Lab 03/18/18 1319 03/18/18 1733 03/19/18 0415  WBC 5.5 6.5 5.1  NEUTROABS  --  4.7  --   HGB 10.7* 9.5* 9.4*  HCT 32.3* 29.8* 28.9*  MCV 90.2 91.4 90.9  PLT 539* 462* 034*   Basic Metabolic Panel: Recent Labs  Lab 03/18/18 1733 03/19/18 0415  NA 140 138  K 3.3* 3.7  CL 104 103  CO2 29 28  GLUCOSE 110* 99  BUN 12 11  CREATININE 0.57 0.58  CALCIUM 8.9 8.8*   Liver Function Tests: Recent Labs  Lab 03/18/18 1733 03/19/18 0415  AST 19 18  ALT 14 13  ALKPHOS 79 77  BILITOT 0.7 0.8  PROT 6.8 6.6  ALBUMIN 3.4* 3.2*   No results for input(s): LIPASE, AMYLASE in the last 168 hours. No results for input(s): AMMONIA in the last 168 hours. Cardiac Enzymes: No results for input(s): CKTOTAL, CKMB, CKMBINDEX, TROPONINI in the last 168 hours. BNP (last 3 results) No results for input(s): BNP in the last 8760 hours. CBG: Recent Labs  Lab 03/18/18 1343 03/18/18 1806 03/18/18 2123 03/19/18 0751 03/19/18 1203  GLUCAP 90 102* 112* 84 93   Time spent: 35 minutes  Signed:  Berle Mull  [facility] 03/19/2018 , 5:59 PM

## 2018-03-24 ENCOUNTER — Encounter (INDEPENDENT_AMBULATORY_CARE_PROVIDER_SITE_OTHER): Payer: Self-pay | Admitting: Physician Assistant

## 2018-03-24 ENCOUNTER — Ambulatory Visit (INDEPENDENT_AMBULATORY_CARE_PROVIDER_SITE_OTHER): Payer: Medicaid Other | Admitting: Physician Assistant

## 2018-03-24 ENCOUNTER — Other Ambulatory Visit: Payer: Self-pay

## 2018-03-24 VITALS — BP 121/81 | HR 89 | Temp 98.5°F | Ht 59.0 in | Wt 138.2 lb

## 2018-03-24 DIAGNOSIS — D649 Anemia, unspecified: Secondary | ICD-10-CM | POA: Diagnosis not present

## 2018-03-24 NOTE — Progress Notes (Signed)
Subjective:  Patient ID: Amy Morrow, female    DOB: 1960-12-17  Age: 57 y.o. MRN: [id number]  CC: anemia and thyroid nodule  HPI  Amy Morrow is a 57 y.o. female with a medical history of anemia, cervical cancer, HLD, ureterovaginal fistula, and s/p bilateral ureteral stent placement presents to f/u on anemia and thyroid nodule. Incidental finding of Thyroid ultrasound on PET scan based on cervical cancer. Original order of [place] FNA Bx thyroid was rejected by radiologist. Subsequent order for thyroid ultrasound was made and is scheduled for tomorrow.     Anemia with Hgb of 9.4 g/dL five days ago. Previously 10.7 g/dL six days ago. Here for redraw. Only complaint is of constant fatigue and mild to moderate post operative pain from radical hysterectomy and pelvic lymphadenectomy. Does not endorse any other symptoms or complaints.   Outpatient Medications Prior to Visit  Medication Sig Dispense Refill  . metFORMIN (GLUCOPHAGE XR) 500 MG 24 hr tablet Take 1 tablet (500 mg total) by mouth daily with breakfast. 30 tablet 5  . nitrofurantoin (MACRODANTIN) 100 MG capsule Take 1 capsule (100 mg total) by mouth at bedtime. 30 capsule 1  . acetaminophen (TYLENOL) 500 MG tablet Take 500 mg by mouth every 6 (six) hours as needed for fever.    . docusate sodium (COLACE) 100 MG capsule Take 1 capsule (100 mg total) by mouth 2 (two) times daily. (Patient not taking: Reported on 03/24/2018) 10 capsule 0  . ondansetron (ZOFRAN) 4 MG tablet Take 1 tablet (4 mg total) by mouth every 6 (six) hours as needed for nausea. (Patient not taking: Reported on 03/24/2018) 20 tablet 0  . polyethylene glycol (MIRALAX / GLYCOLAX) packet Take 17 g by mouth daily. (Patient not taking: Reported on 03/24/2018) 14 each 0   No facility-administered medications prior to visit.      ROS Review of Systems  Constitutional: Positive for malaise/fatigue. Negative for chills and fever.  Eyes: Negative for blurred vision.   Respiratory: Negative for shortness of breath.   Cardiovascular: Negative for chest pain and palpitations.  Gastrointestinal: Negative for abdominal pain and nausea.  Genitourinary: Negative for dysuria and hematuria.  Musculoskeletal: Negative for joint pain and myalgias.  Skin: Negative for rash.  Neurological: Negative for tingling and headaches.  Psychiatric/Behavioral: Negative for depression. The patient is not nervous/anxious.     Objective:  BP 121/81 (BP Location: Right Arm, Patient Position: Sitting, Cuff Size: Normal)   Pulse 89   Temp 98.5 F (36.9 C) (Oral)   Ht 4\' 11"  (1.499 m)   Wt 138 lb 3.2 oz (62.7 kg)   LMP 11/16/2012   SpO2 99%   BMI 27.91 kg/m   BP/Weight 03/24/2018 03/19/2018 12/20/8414  Systolic BP 606 301 601  Diastolic BP 81 77 80  Wt. (Lbs) 138.2 - -  BMI 27.91 - 28.07      Physical Exam  Constitutional: She is oriented to person, place, and time.  Well developed, well nourished, NAD, polite  HENT:  Head: Normocephalic and atraumatic.  Eyes: No scleral icterus.  Neck: Normal range of motion. Neck supple. No thyromegaly present.  Cardiovascular: Normal rate, regular rhythm and normal heart sounds.  Pulmonary/Chest: Effort normal and breath sounds normal.  Abdominal: Soft. Bowel sounds are normal. There is no tenderness.  Musculoskeletal: She exhibits no edema.  Neurological: She is alert and oriented to person, place, and time.  Skin: Skin is warm and dry. No rash noted. No erythema. No pallor.  Psychiatric: She has a normal mood and affect. Her behavior is normal. Thought content normal.  Vitals reviewed.    Assessment & Plan:   1. Anemia, unspecified type - CBC with Differential   Follow-up: Return if symptoms worsen or fail to improve, for will call with CBC and Thyroid US result.   [name] PA

## 2018-03-24 NOTE — Patient Instructions (Signed)
Anemia  Anemia  La anemia es una afeccin en la cual no hay suficientes glbulos rojos o hemoglobina. La hemoglobina es la sustancia de los glbulos rojos que lleva el oxgeno. Cuando no hay suficientes glbulos rojos o hemoglobina (est anmico), su cuerpo no puede recibir el oxgeno suficiente, y es posible que sus rganos no funcionen correctamente. Como resultado, es posible que se sienta muy cansado o sufra otros problemas.  Cules son las causas?  Las causas ms frecuentes de anemia son:   Sangrado excesivo. La anemia puede ser causada por un sangrado excesivo dentro o fuera del cuerpo, incluido el sangrado del intestino o del perodo en las mujeres.   Dficit nutricional.   Enfermedad heptica, tiroidea o renal (crnicas).   Trastornos de la mdula sea.   Cncer y tratamientos para el cncer.   VIH (virus de inmunodeficiencia humana) y SIDA (sndrome de inmunodeficiencia adquirida).   Tratamientos para el VIH y el SIDA.   Problemas en el bazo.   Enfermedades de la sangre.   Infecciones, medicamentos y enfermedades autoinmunes que destruyen los glbulos rojos.    Cules son los signos o los sntomas?  Los sntomas de esta afeccin incluyen los siguientes:   Debilidad leve.   Mareos.   Dolor de cabeza.   Sensacin de latidos cardacos irregulares o ms rpidos que lo normal (palpitaciones).   Falta de aire, especialmente con el ejercicio.   Palidez.   Sensibilidad al fro.   Dispepsia.   Nuseas.   Dificultad para dormir.   Dificultad para concentrarse.    Los sntomas pueden ocurrir repentinamente o manifestarse lentamente. Si la anemia es leve, es posible que no tenga sntomas.  Cmo se diagnostica?  Esta afeccin se diagnostica en funcin de lo siguiente:   Anlisis de sangre.   Sus antecedentes mdicos.   Un examen fsico.   Biopsia de mdula sea.    Adems, el mdico puede controlar si hay sangre en sus heces (materia fecal) y realizar anlisis adicionales para detectar la  causa del sangrado.  Tambin pueden hacerle otros estudios, por ejemplo:   Pruebas de diagnstico por imgenes, como una resonancia magntica (RM) o una exploracin por tomografa computarizada (TC).   Endoscopia.   Colonoscopia.    Cmo se trata?  El tratamiento de esta afeccin depende de la causa. Si contina perdiendo mucha sangre, es posible que necesite recibir tratamiento en un hospital. El tratamiento puede incluir lo siguiente:   Tomar suplementos de hierro, vitamina B12 o cido flico.   Tomar un medicamento para las hormonas (eritropoyetina) que puede ayudar a estimular el desarrollo de glbulos rojos.   Recibir una transfusin de sangre. Esta ser necesaria si pierde mucha sangre.   Realizar cambios en la dieta.   Someterse a una ciruga para extirpar el bazo.    Siga estas indicaciones en su casa:   Tome los medicamentos de venta libre y los recetados solamente como se lo haya indicado el mdico.   Tome los suplementos solamente como se lo haya indicado el mdico.   Siga las instrucciones de la dieta que le hayan dado.   Concurra a todas las visitas de seguimiento como se lo haya indicado el mdico. Esto es importante.  Comunquese con un mdico si:   Tiene nuevos sangrados en cualquier parte del cuerpo.  Solicite ayuda de inmediato si:   Se siente muy dbil.   Le falta el aire.   Siente dolor en la espalda, el abdomen o el pecho.   Se   siente mareado o sufre un desmayo.   Tiene dificultad para concentrarse.   Las heces son alquitranadas, sanguinolentas o negras.   Vomita repetidamente o vomita sangre.  Resumen   La anemia es una afeccin en la que no hay suficientes glbulos rojos o la cantidad suficiente de la sustancia de los glbulos rojos que transporta el oxgeno (hemoglobina).   Los sntomas pueden ocurrir repentinamente o manifestarse lentamente.   Si la anemia es leve, es posible que no tenga sntomas.   Esta afeccin se diagnostica mediante anlisis de sangre y un  examen fsico, y en funcin de sus antecedentes mdicos. Pueden ser necesarios otros estudios.   El tratamiento de esta afeccin depende de la causa de la anemia.  Esta informacin no tiene como fin reemplazar el consejo del mdico. Asegrese de hacerle al mdico cualquier pregunta que tenga.  Document Released: 07/21/2005 Document Revised: 11/10/2016 Document Reviewed: 11/10/2016  Elsevier Interactive Patient Education  2018 [employer]vier Inc[employer]

## 2018-03-25 ENCOUNTER — Ambulatory Visit
Admission: RE | Admit: 2018-03-25 | Discharge: 2018-03-25 | Disposition: A | Discharge status: Home / Self Care | Payer: Medicaid Other | Source: Ambulatory Visit | Attending: Physician Assistant | Admitting: Physician Assistant

## 2018-03-25 ENCOUNTER — Telehealth (INDEPENDENT_AMBULATORY_CARE_PROVIDER_SITE_OTHER): Payer: Self-pay

## 2018-03-25 DIAGNOSIS — E041 Nontoxic single thyroid nodule: Secondary | ICD-10-CM | POA: Insufficient documentation

## 2018-03-25 DIAGNOSIS — R948 Abnormal results of function studies of other organs and systems: Secondary | ICD-10-CM

## 2018-03-25 HISTORY — DX: Nontoxic single thyroid nodule: E04.1

## 2018-03-25 LAB — CBC WITH DIFFERENTIAL/PLATELET
BASOS ABS: 0 10*3/uL (ref 0.0–0.2)
BASOS: 1 %
EOS (ABSOLUTE): 0.3 10*3/uL (ref 0.0–0.4)
Eos: 5 %
HEMOGLOBIN: 10.2 g/dL — AB (ref 11.1–15.9)
Hematocrit: 31.6 % — ABNORMAL LOW (ref 34.0–46.6)
IMMATURE GRANS (ABS): 0 10*3/uL (ref 0.0–0.1)
IMMATURE GRANULOCYTES: 0 %
LYMPHS: 38 %
Lymphocytes Absolute: 2.1 10*3/uL (ref 0.7–3.1)
MCH: 29.1 pg (ref 26.6–33.0)
MCHC: 32.3 g/dL (ref 31.5–35.7)
MCV: 90 fL (ref 79–97)
MONOCYTES: 9 %
Monocytes Absolute: 0.5 10*3/uL (ref 0.1–0.9)
NEUTROS ABS: 2.7 10*3/uL (ref 1.4–7.0)
NEUTROS PCT: 47 %
Platelets: 401 10*3/uL (ref 150–450)
RBC: 3.51 x10E6/uL — ABNORMAL LOW (ref 3.77–5.28)
RDW: 14.2 % (ref 12.3–15.4)
WBC: 5.6 10*3/uL (ref 3.4–10.8)

## 2018-03-25 NOTE — Telephone Encounter (Signed)
Call placed using pacific interpreter [place] [phone]) recording stating that the [place] number you are calling has not been assigned yet and then disconnected. Will try to call again. Nat Christen, CMA

## 2018-03-25 NOTE — Telephone Encounter (Signed)
-----   Message from [name], [name] sent at 03/25/2018  8:42 AM EDT ----- Anemia is mildly better. No need for transfusion.

## 2018-03-26 ENCOUNTER — Telehealth (INDEPENDENT_AMBULATORY_CARE_PROVIDER_SITE_OTHER): Payer: Self-pay

## 2018-03-26 ENCOUNTER — Encounter (INDEPENDENT_AMBULATORY_CARE_PROVIDER_SITE_OTHER): Payer: Self-pay

## 2018-03-26 ENCOUNTER — Other Ambulatory Visit (INDEPENDENT_AMBULATORY_CARE_PROVIDER_SITE_OTHER): Payer: Self-pay | Admitting: Physician Assistant

## 2018-03-26 DIAGNOSIS — E041 Nontoxic single thyroid nodule: Secondary | ICD-10-CM

## 2018-03-26 NOTE — Telephone Encounter (Signed)
Called patient from office phone since she had not been answering when called with an interpreter and she had not returned any of the calls. Patient did answer and gave daughter verbal permission to interpret. Call was on speaker. Patient is aware that her anemia is mildly better and no need for a transfusion. She was also notified that her thyroid US did show a highly suspicious nodule and she will now need a US thyroid biopsy. She is aware that someone will call her from radiology to schedule the biopsy. Patient expressed understanding and did not have any questions. Nat Christen, CMA

## 2018-03-26 NOTE — Telephone Encounter (Signed)
-----   Message from [name], [name] sent at 03/25/2018  8:42 AM EDT ----- Anemia is mildly better. No need for transfusion.

## 2018-03-26 NOTE — Telephone Encounter (Signed)
Call placed using pacific interpreter [phone]) left voicemail asking patient to call the office. Tried patients home number again and received error message that the number had not yet been assigned. Results mailed to patient. Nat Christen, CMA

## 2018-03-26 NOTE — Telephone Encounter (Signed)
-----   Message from [name], [name] sent at 03/26/2018 12:45 PM EDT ----- US thyroid shows a highly suspicious nodule in the inferior right thyroid lobe. I have ordered US thyroid biopsy.

## 2018-04-07 ENCOUNTER — Inpatient Hospital Stay: Payer: Medicaid Other | Attending: Obstetrics | Admitting: Gynecologic Oncology

## 2018-04-07 ENCOUNTER — Encounter: Payer: Self-pay | Admitting: Gynecologic Oncology

## 2018-04-07 VITALS — BP 115/60 | HR 90 | Temp 98.1°F | Resp 20 | Ht 59.0 in | Wt 140.8 lb

## 2018-04-07 DIAGNOSIS — C531 Malignant neoplasm of exocervix: Secondary | ICD-10-CM | POA: Diagnosis not present

## 2018-04-07 DIAGNOSIS — Z90722 Acquired absence of ovaries, bilateral: Secondary | ICD-10-CM

## 2018-04-07 DIAGNOSIS — N821 Other female urinary-genital tract fistulae: Secondary | ICD-10-CM

## 2018-04-07 DIAGNOSIS — Z90710 Acquired absence of both cervix and uterus: Secondary | ICD-10-CM

## 2018-04-07 MED ORDER — ESTROGENS CONJUGATED 0.9 MG PO TABS: 0.9000 mg | ORAL_TABLET | Freq: Every day | ORAL | 6 refills | Status: DC

## 2018-04-07 MED FILL — PREMARIN 0.9 MG TABLET: 0.9 | 28 days supply | Qty: 21 | Fill #0

## 2018-04-07 NOTE — Progress Notes (Signed)
Follow-up Note: GYN-ONC  Consult was requested by Dr. Darron Doom, MD   CC:  Chief Complaint  Patient presents with  . Malignant neoplasm of exocervix ([place])  uretero-vaginal fistulae.  Patient was seen with an interpretor.   Assessment/Plan: 1. Stage IB1 cervical carcinoma o S/p radical hysterectomy with pelvic lymphadenectomy  o Low risk features, no adjuvant therapy recommended 2. Ureterovaginal fistula (bilateral)  o S/p right nephrostomy tube placement 03/02/18, s/p left nephrostomy tube placement 03/18/18, with improved (decreased drainage) and significant improvement in visible ureterovaginal fistulae.  o Prescribed premarin daily (oral) to help with vaginal healing and to improve symptoms of hot flashes that are related to surgical menopause.  o S/p admission for urosepsis - clinically resolved. Continue 100mg  daily macrodantin for prophylaxis given communication between ureters and vagina with foreign bodies (stents) in ureters.  o Dr Burman Nieves from [facility] on consult for this and will see her again in 3 weeks to re-evaluate for healing/need for reimplantation procedure.   I will see her back in 6 weeks for evaluation.  HPI: [name]  is a very nice 57 y.o.  P5  She has been undergoing cervical cancer screening through [place] [facility]. 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 01/2012 LSIL + endometrial cells 07/2012 Pap negative 01/2013 Pap negative with HRHPV not detected 01/2015 Pap negative and HRHPV not detected  Was noting postmenopausal bleeding and saw [place] [facility] again. 12/2017 Pap negative now HRHPV detected - Mass noted on exam and she was referred to the [place] [facility] where Dr. 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   Referred for management. Only complaint is the PMB. Denies pain. States bleeding minimal.  She underwent a PET/CT which showed no apparent metastatic disease. On 02/08/18 she  underwent robotic assisted radical hysterectomy, upper vaginectomy, bilateral pelvic lymphadenectomy.  Operative findings were significant for a 2 to 3 cm pedunculated exophytic mass from the right anterior cervix.  There is no clinical involvement of the parametrium and no suspicious nodes.  The surgery was overall fairly uncomplicated there was some increased bleeding encountered during the dissection around the posterior bladder pillars.  At the completion of the procedure the ureters bilaterally appeared intact and were completely skeletonized in the distal third free from all parametrial tissue.  Patient was readmitted on postop day 7 with bilateral ureterovaginal fistulas is confirmed on both CT urogram, as well as cystoscopy with retrograde pyelography with Dr. Louis Meckel.  During that cystoscopic procedure Dr. Louis Meckel placed ureteral stents bilaterally.  The patient was treated with broad-spectrum antibiotics to cover pelvic infection.  She was discharged after a 3-day hospital stay.  Interval Hx:  On 03/02/18 she underwent right percutaneous nephrostomy tube placement. Immediately after placement she developed fevers and hypotension and was admitted with sepsis from occult pyelonephritis. She received IVF resuscitation and IV antibiotics. She was discharged on 03/06/18. After placement of the right PCN she noted slight decrease in urinary leakage from the vagina, though still persisted.  Therefore placement of a left PCN was placed on 03/18/18 and this improved urinary leakage from the vagina substantially. She was placed on prophylactic nitrofurantoid 100mg  daily.  She denies vaginal bleeding and only has small amount of leakage at night. She reports hot flashes and anxiety symptoms.    Current Meds:  Outpatient Encounter Medications as of 04/07/2018  Medication Sig  . acetaminophen (TYLENOL) 500 MG tablet Take 500 mg by mouth every 6 (  six) hours as needed for fever.  . metFORMIN (GLUCOPHAGE XR)  500 MG 24 hr tablet Take 1 tablet (500 mg total) by mouth daily with breakfast.  . nitrofurantoin (MACRODANTIN) 100 MG capsule Take 1 capsule (100 mg total) by mouth at bedtime.  . polyethylene glycol (MIRALAX / GLYCOLAX) packet Take 17 g by mouth daily.  Marland Kitchen estrogens, conjugated, (PREMARIN) 0.9 MG tablet Take 1 tablet (0.9 mg total) by mouth daily. Take daily for 21 days then do not take for 7 days.  . [DISCONTINUED] docusate sodium (COLACE) 100 MG capsule Take 1 capsule (100 mg total) by mouth 2 (two) times daily. (Patient not taking: Reported on 03/24/2018)  . [DISCONTINUED] ondansetron (ZOFRAN) 4 MG tablet Take 1 tablet (4 mg total) by mouth every 6 (six) hours as needed for nausea. (Patient not taking: Reported on 03/24/2018)   No facility-administered encounter medications on file as of 04/07/2018.     Allergy: No Known Allergies  Social Hx:  Tobacco use: none Alcohol use: none Illicit Drug use: none Illicit IV Drug use: none  Past Surgical Hx:  Past Surgical History:  Procedure Laterality Date  . CYSTOSCOPY W/ RETROGRADES Bilateral 02/16/2018   Procedure: CYSTOSCOPY WITH RETROGRADE PYELOGRAM BILATERAL DIAGNOSTIC URETEROSCOPY, BILATERAL  STENT PLACEMENT;  Surgeon: Ardis Hughs, MD;  Location: [facility];  Service: Urology;  Laterality: Bilateral;  . D & C LEEP CONIZATION BX  07-31-2003   dr p. rose  [facility]  . IR NEPHROSTOGRAM RIGHT THRU EXISTING ACCESS  03/18/2018  . IR NEPHROSTOMY PLACEMENT LEFT  03/18/2018  . IR NEPHROSTOMY PLACEMENT RIGHT  03/02/2018  . PELVIC LYMPH NODE DISSECTION Bilateral 02/08/2018   Procedure: BILATEAL PEVIC  LYMPHADENECTOMY;  Surgeon: [name], MD;  Location: [facility];  Service: Gynecology;  Laterality: Bilateral;  . ROBOTIC ASSISTED TOTAL HYSTERECTOMY WITH BILATERAL SALPINGO OOPHERECTOMY N/A 02/08/2018   Procedure: XI ROBOTIC ASSISTED TOTAL Radical HYSTERECTOMY WITH BILATERAL SALPINGO OOPHORECTOMY;  Surgeon: [name], MD;  Location: [facility];  Service: Gynecology;   Laterality: N/A;  . Transvaginal tape placement  03-12-2009  dr Emeterio Reeve  [facility]   GYNECARE TENSION-FREE VAGINAL TAPE SLING  . TUBAL LIGATION  05-28-2005  DR  MARSHALL  @ [facility]   PPTL    Past Medical Hx:  Past Medical History:  Diagnosis Date  . Abnormal Pap smear   . Anemia   . Cervical cancer [facility]) oncologist- dr Denman George   Stage IB1  SCCa   . Hyperlipidemia   . Urgency of urination    intermittant    Past Gynecological History:   GYNECOLOGIC HISTORY:  Patient's last menstrual period was 11/16/2012. Menarche: 57 years old P 5 LMP 57 yo Contraceptive 5 years OCP HRT none  Last Pap see HPI  Family Hx:  Family History  Problem Relation Age of Onset  . Hypertension Mother   . Heart disease Mother   . Hypertension Brother   . Heart disease Brother     Review of Systems:  Review of Systems  Constitutional: Negative.   HENT:  Negative.   Eyes: Negative.   Respiratory: Negative.   Cardiovascular: Negative.   Endocrine: Negative.   Genitourinary: Positive for vaginal bleeding.        Leakage of urine from vagina  Musculoskeletal: Negative.   Skin: Negative.   Neurological: Negative.   Hematological: Negative.   Psychiatric/Behavioral: Positive for depression.    Vitals:  Blood pressure 115/60, pulse 90, temperature 98.1 F (36.7 C), temperature source Oral, resp. rate 20, height  4\' 11"  (1.499 m), weight 140 lb 12.8 oz (63.9 kg), last menstrual period 11/16/2012, SpO2 100 %. Body mass index is 28.44 kg/m.   Physical Exam: ECOG PERFORMANCE STATUS: 1 - Symptomatic but completely ambulatory   General :  Well developed, 57 y.o., female in no apparent distress HEENT:  Normocephalic/atraumatic, symmetric, EOMI, eyelids normal Neck:   Supple, no masses.  Lymphatics:  No cervical/ submandibular/ supraclavicular/ infraclavicular/ inguinal adenopathy Respiratory:  Respirations unlabored, no use of accessory muscles CV:   Deferred Breast:  Deferred Musculoskeletal: No  CVA tenderness, normal muscle strength. Abdomen:  Soft, non-tender and nondistended. No evidence of hernia. No masses. Well healed incisions.  Extremities:  No lymphedema, no erythema, non-tender. Skin:   Normal inspection Neuro/Psych:  No focal motor deficit, no abnormal mental status. Normal gait. Normal affect. Alert and oriented to person, place, and time  Genito Urinary: Speculum exam reveals no blood. Stents no longer visible vaginally. There are puckered areas in the right and left vaginal fornices and a small amount (trace) urine in the vagina, but actual fistulae no longer visible.  No active bleeding.  Rectovaginal:  deferred    Thereasa Solo, MD  04/07/2018, 12:01 PM    Cc: Darron Doom, MD (Referring Ob/Gyn)

## 2018-04-07 NOTE — Patient Instructions (Addendum)
The holes in the ureters (tubes that drain the kidneys) appear to be closing.  Dr Denman George has prescribed you premarin (estrogen) tablets which are a female hormone. These can help the vaginal tissues heal better. They also help with hot flash symptoms from menopause. She recommends stopping these after 6-12 months as prolonged use (for more than 5 years) can increase your risk for cancer. But your risk for cancer is not increased for use for 6-12 months.  Dr Denman George recommends that you do not place anything in the vagina (including no sex) until there has been evidence of complete healing of the holes.  Dr Denman George recommends continuing the tubes that are leaving your back to drain urine as these are keeping urine away from the vaginal holes and helping them to heal. These will be removed after there is complete healing of these holes in the ureters.  Please follow-up with Dr Louis Meckel as scheduled.  Dr Denman George will see you for follow-up in 6 weeks, but please reach out to her office sooner (at [phone]) if you have any questions.

## 2018-04-13 ENCOUNTER — Encounter (HOSPITAL_BASED_OUTPATIENT_CLINIC_OR_DEPARTMENT_OTHER): Payer: Self-pay

## 2018-04-14 ENCOUNTER — Other Ambulatory Visit: Payer: Self-pay | Admitting: Urology

## 2018-04-14 ENCOUNTER — Other Ambulatory Visit: Payer: Self-pay

## 2018-04-14 ENCOUNTER — Encounter (HOSPITAL_BASED_OUTPATIENT_CLINIC_OR_DEPARTMENT_OTHER): Payer: Self-pay

## 2018-04-14 NOTE — Pre-Procedure Instructions (Signed)
Per conversation with Dr. Deatra Canter no follow up chest xray required prior to procedure 04/22/2018.

## 2018-04-14 NOTE — Progress Notes (Signed)
Spoke with:  Ayda NPO:  After Midnight, no gum, candy, or mints   Arrival time:  0630AM Labs: Istat 4, EKG AM medications:  None Pre op orders: No Ride home:  Remo Lipps (son) [phone]

## 2018-04-22 ENCOUNTER — Ambulatory Visit (HOSPITAL_BASED_OUTPATIENT_CLINIC_OR_DEPARTMENT_OTHER): Payer: Medicaid Other | Admitting: Anesthesiology

## 2018-04-22 ENCOUNTER — Ambulatory Visit (HOSPITAL_BASED_OUTPATIENT_CLINIC_OR_DEPARTMENT_OTHER)
Admission: RE | Admit: 2018-04-22 | Discharge: 2018-04-22 | Disposition: A | Discharge status: Home / Self Care | Payer: Medicaid Other | Source: Ambulatory Visit | Attending: Urology | Admitting: Urology

## 2018-04-22 ENCOUNTER — Encounter (HOSPITAL_BASED_OUTPATIENT_CLINIC_OR_DEPARTMENT_OTHER): Payer: Self-pay | Admitting: *Deleted

## 2018-04-22 ENCOUNTER — Encounter (HOSPITAL_BASED_OUTPATIENT_CLINIC_OR_DEPARTMENT_OTHER)
Admission: RE | Disposition: A | Discharge status: Home / Self Care | Payer: Self-pay | Source: Ambulatory Visit | Attending: Urology

## 2018-04-22 ENCOUNTER — Other Ambulatory Visit: Payer: Self-pay

## 2018-04-22 DIAGNOSIS — Z90710 Acquired absence of both cervix and uterus: Secondary | ICD-10-CM | POA: Diagnosis not present

## 2018-04-22 DIAGNOSIS — Z8541 Personal history of malignant neoplasm of cervix uteri: Secondary | ICD-10-CM | POA: Insufficient documentation

## 2018-04-22 DIAGNOSIS — E119 Type 2 diabetes mellitus without complications: Secondary | ICD-10-CM | POA: Diagnosis not present

## 2018-04-22 DIAGNOSIS — Z7984 Long term (current) use of oral hypoglycemic drugs: Secondary | ICD-10-CM | POA: Diagnosis not present

## 2018-04-22 DIAGNOSIS — G4730 Sleep apnea, unspecified: Secondary | ICD-10-CM | POA: Insufficient documentation

## 2018-04-22 DIAGNOSIS — Z936 Other artificial openings of urinary tract status: Secondary | ICD-10-CM | POA: Diagnosis not present

## 2018-04-22 DIAGNOSIS — Z466 Encounter for fitting and adjustment of urinary device: Secondary | ICD-10-CM | POA: Insufficient documentation

## 2018-04-22 DIAGNOSIS — N821 Other female urinary-genital tract fistulae: Secondary | ICD-10-CM

## 2018-04-22 DIAGNOSIS — Z90722 Acquired absence of ovaries, bilateral: Secondary | ICD-10-CM | POA: Diagnosis not present

## 2018-04-22 DIAGNOSIS — D649 Anemia, unspecified: Secondary | ICD-10-CM | POA: Diagnosis not present

## 2018-04-22 DIAGNOSIS — C531 Malignant neoplasm of exocervix: Secondary | ICD-10-CM | POA: Diagnosis not present

## 2018-04-22 HISTORY — DX: Other specified postprocedural states: R11.2

## 2018-04-22 HISTORY — DX: Prediabetes: R73.03

## 2018-04-22 HISTORY — DX: Unspecified lump in the left breast, unspecified quadrant: N63.20

## 2018-04-22 HISTORY — DX: Other fatigue: R53.83

## 2018-04-22 HISTORY — DX: Frequency of micturition: R35.0

## 2018-04-22 HISTORY — DX: Sepsis, unspecified organism: A41.9

## 2018-04-22 HISTORY — DX: Dyspnea, unspecified: R06.00

## 2018-04-22 HISTORY — PX: CYSTOSCOPY W/ URETERAL STENT PLACEMENT: SHX1429

## 2018-04-22 HISTORY — DX: Other specified postprocedural states: Z98.890

## 2018-04-22 HISTORY — DX: Other female urinary-genital tract fistulae: N82.1

## 2018-04-22 HISTORY — DX: Nontoxic single thyroid nodule: E04.1

## 2018-04-22 LAB — POCT I-STAT, CHEM 8
BUN: 9 mg/dL (ref 6–20)
CALCIUM ION: 1.15 mmol/L (ref 1.15–1.40)
Chloride: 104 mmol/L (ref 98–111)
Creatinine, Ser: 0.5 mg/dL (ref 0.44–1.00)
Glucose, Bld: 96 mg/dL (ref 70–99)
HCT: 31 % — ABNORMAL LOW (ref 36.0–46.0)
HEMOGLOBIN: 10.5 g/dL — AB (ref 12.0–15.0)
Potassium: 3.7 mmol/L (ref 3.5–5.1)
SODIUM: 139 mmol/L (ref 135–145)
TCO2: 26 mmol/L (ref 22–32)

## 2018-04-22 SURGERY — CYSTOSCOPY, WITH RETROGRADE PYELOGRAM AND URETERAL STENT INSERTION
Anesthesia: General | Site: Renal | Laterality: Bilateral

## 2018-04-22 MED ORDER — ONDANSETRON HCL 4 MG/2ML IJ SOLN
INTRAMUSCULAR | Status: DC | PRN
  Administered 2018-04-22: 4 mg via INTRAVENOUS

## 2018-04-22 MED ORDER — TRAMADOL HCL 50 MG PO TABS: 50.0000 mg | ORAL_TABLET | Freq: Every evening | ORAL | 0 refills | Status: AC | PRN

## 2018-04-22 MED ORDER — FENTANYL CITRATE (PF) 100 MCG/2ML IJ SOLN
INTRAMUSCULAR | Status: AC
  Filled 2018-04-22: qty 2

## 2018-04-22 MED ORDER — MEPERIDINE HCL 25 MG/ML IJ SOLN
6.2500 mg | INTRAMUSCULAR | Status: DC | PRN
  Filled 2018-04-22: qty 1

## 2018-04-22 MED ORDER — SCOPOLAMINE 1 MG/3DAYS TD PT72
MEDICATED_PATCH | TRANSDERMAL | Status: AC
Start: 2018-04-22 — End: ?
  Filled 2018-04-22: qty 1

## 2018-04-22 MED ORDER — MIDAZOLAM HCL 2 MG/2ML IJ SOLN
INTRAMUSCULAR | Status: DC | PRN
  Administered 2018-04-22: 2 mg via INTRAVENOUS

## 2018-04-22 MED ORDER — ARTIFICIAL TEARS OPHTHALMIC OINT
TOPICAL_OINTMENT | OPHTHALMIC | Status: AC
  Filled 2018-04-22: qty 10.5

## 2018-04-22 MED ORDER — FENTANYL CITRATE (PF) 100 MCG/2ML IJ SOLN
25.0000 ug | INTRAMUSCULAR | Status: DC | PRN
  Filled 2018-04-22: qty 1

## 2018-04-22 MED ORDER — GENTAMICIN SULFATE 40 MG/ML IJ SOLN
5.0000 mg/kg | INTRAVENOUS | Status: DC
  Administered 2018-04-22: 280 mg via INTRAVENOUS
  Filled 2018-04-22: qty 8

## 2018-04-22 MED ORDER — PROPOFOL 10 MG/ML IV BOLUS
INTRAVENOUS | Status: DC | PRN
  Administered 2018-04-22: 170 mg via INTRAVENOUS

## 2018-04-22 MED ORDER — HYDROCODONE-ACETAMINOPHEN 7.5-325 MG PO TABS
1.0000 | ORAL_TABLET | Freq: Once | ORAL | Status: DC | PRN
  Filled 2018-04-22: qty 1

## 2018-04-22 MED ORDER — SCOPOLAMINE 1 MG/3DAYS TD PT72
MEDICATED_PATCH | TRANSDERMAL | Status: DC | PRN
  Administered 2018-04-22: 1 via TRANSDERMAL

## 2018-04-22 MED ORDER — FENTANYL CITRATE (PF) 100 MCG/2ML IJ SOLN
INTRAMUSCULAR | Status: DC | PRN
  Administered 2018-04-22: 50 ug via INTRAVENOUS

## 2018-04-22 MED ORDER — LACTATED RINGERS IV SOLN
INTRAVENOUS | Status: DC
  Administered 2018-04-22: 07:00:00 via INTRAVENOUS
  Filled 2018-04-22: qty 1000

## 2018-04-22 MED ORDER — METOCLOPRAMIDE HCL 5 MG/ML IJ SOLN
10.0000 mg | Freq: Once | INTRAMUSCULAR | Status: DC | PRN
  Filled 2018-04-22: qty 2

## 2018-04-22 MED ORDER — SODIUM CHLORIDE 0.9 % IJ SOLN
INTRAMUSCULAR | Status: AC
  Filled 2018-04-22: qty 10

## 2018-04-22 MED ORDER — MIDAZOLAM HCL 2 MG/2ML IJ SOLN
INTRAMUSCULAR | Status: AC
  Filled 2018-04-22: qty 2

## 2018-04-22 MED ORDER — ONDANSETRON HCL 4 MG/2ML IJ SOLN
INTRAMUSCULAR | Status: AC
Start: 2018-04-22 — End: ?
  Filled 2018-04-22: qty 2

## 2018-04-22 MED ORDER — IOHEXOL 300 MG/ML  SOLN
INTRAMUSCULAR | Status: DC | PRN
  Administered 2018-04-22: 60 mL

## 2018-04-22 MED ORDER — DEXAMETHASONE SODIUM PHOSPHATE 10 MG/ML IJ SOLN
INTRAMUSCULAR | Status: DC | PRN
  Administered 2018-04-22: 10 mg via INTRAVENOUS

## 2018-04-22 MED ORDER — DEXAMETHASONE SODIUM PHOSPHATE 10 MG/ML IJ SOLN
INTRAMUSCULAR | Status: AC
  Filled 2018-04-22: qty 1

## 2018-04-22 MED ORDER — LIDOCAINE 2% (20 MG/ML) 5 ML SYRINGE
INTRAMUSCULAR | Status: AC
  Filled 2018-04-22: qty 5

## 2018-04-22 MED ORDER — PROPOFOL 10 MG/ML IV BOLUS
INTRAVENOUS | Status: AC
  Filled 2018-04-22: qty 40

## 2018-04-22 MED ORDER — EPHEDRINE SULFATE 50 MG/ML IJ SOLN
INTRAMUSCULAR | Status: DC | PRN
  Administered 2018-04-22 (×2): 5 mg via INTRAVENOUS

## 2018-04-22 MED ORDER — LIDOCAINE 2% (20 MG/ML) 5 ML SYRINGE
INTRAMUSCULAR | Status: DC | PRN
  Administered 2018-04-22: 60 mg via INTRAVENOUS

## 2018-04-22 MED ORDER — GENTAMICIN SULFATE 40 MG/ML IJ SOLN
280.0000 mg | INTRAVENOUS | Status: DC
  Filled 2018-04-22: qty 7

## 2018-04-22 MED ORDER — STERILE WATER FOR IRRIGATION IR SOLN
Status: DC | PRN
  Administered 2018-04-22: 3000 mL

## 2018-04-22 SURGICAL SUPPLY — 34 items
BAG DRAIN URO-CYSTO SKYTR STRL (DRAIN) ×2 IMPLANT
BASKET LASER NITINOL 1.9FR (BASKET) IMPLANT
BASKET STNLS GEMINI 4WIRE 3FR (BASKET) IMPLANT
BASKET ZERO TIP NITINOL 2.4FR (BASKET) IMPLANT
CATH URET 5FR 28IN OPEN ENDED (CATHETERS) ×2 IMPLANT
CATH URET DUAL LUMEN 6-10FR 50 (CATHETERS) ×2 IMPLANT
CLOTH BEACON ORANGE TIMEOUT ST (SAFETY) ×2 IMPLANT
EXTRACTOR STONE 1.7FRX115CM (UROLOGICAL SUPPLIES) IMPLANT
FIBER LASER TRAC TIP (UROLOGICAL SUPPLIES) IMPLANT
GLOVE BIO SURGEON STRL SZ 6.5 (GLOVE) ×2 IMPLANT
GLOVE BIO SURGEON STRL SZ7.5 (GLOVE) ×2 IMPLANT
GLOVE BIOGEL PI IND STRL 7.0 (GLOVE) ×1 IMPLANT
GLOVE BIOGEL PI IND STRL 7.5 (GLOVE) ×2 IMPLANT
GLOVE BIOGEL PI INDICATOR 7.0 (GLOVE) ×1
GLOVE BIOGEL PI INDICATOR 7.5 (GLOVE) ×2
GOWN STRL REUS W/TWL XL LVL3 (GOWN DISPOSABLE) ×6 IMPLANT
GUIDEWIRE 0.038 PTFE COATED (WIRE) IMPLANT
GUIDEWIRE ANG ZIPWIRE 038X150 (WIRE) IMPLANT
GUIDEWIRE STR DUAL SENSOR (WIRE) ×4 IMPLANT
INFUSOR MANOMETER BAG 3000ML (MISCELLANEOUS) IMPLANT
IV NS IRRIG 3000ML ARTHROMATIC (IV SOLUTION) IMPLANT
KIT BALLIN UROMAX 15FX10 (LABEL) IMPLANT
KIT BALLN UROMAX 15FX4 (MISCELLANEOUS) IMPLANT
KIT BALLN UROMAX 26 75X4 (MISCELLANEOUS)
KIT TURNOVER CYSTO (KITS) ×2 IMPLANT
MANIFOLD NEPTUNE II (INSTRUMENTS) ×2 IMPLANT
NS IRRIG 500ML POUR BTL (IV SOLUTION) ×2 IMPLANT
PACK CYSTO (CUSTOM PROCEDURE TRAY) ×2 IMPLANT
SET HIGH PRES BAL DIL (LABEL)
SHEATH ACCESS URETERAL 38CM (SHEATH) IMPLANT
STENT POLARIS 5FRX22 (STENTS) ×4 IMPLANT
TUBE CONNECTING 12X1/4 (SUCTIONS) IMPLANT
TUBING UROLOGY SET (TUBING) IMPLANT
WATER STERILE IRR 3000ML UROMA (IV SOLUTION) ×2 IMPLANT

## 2018-04-22 NOTE — Anesthesia Postprocedure Evaluation (Signed)
Anesthesia Post Note  Patient: KYNDAHL JABLON  Procedure(s) Performed: CYSTOSCOPY WITH BILATERAL RETROGRADE PYELOGRAM/ AND URETERAL STENT EXCHANGE, vaginal exam (Bilateral Renal)     Patient location during evaluation: PACU Anesthesia Type: General Level of consciousness: awake and alert and oriented Pain management: pain level controlled Vital Signs Assessment: post-procedure vital signs reviewed and stable Respiratory status: spontaneous breathing, nonlabored ventilation and respiratory function stable Cardiovascular status: blood pressure returned to baseline and stable Postop Assessment: no apparent nausea or vomiting Anesthetic complications: no    Last Vitals:  Vitals:   04/22/18 1015 04/22/18 1030  BP: 119/65 123/75  Pulse: 73 83  Resp: 14 11  Temp:    SpO2: 100% 100%    Last Pain:  Vitals:   04/22/18 1000  TempSrc:   PainSc: Asleep                 Birdella Sippel A.

## 2018-04-22 NOTE — Care Management Note (Signed)
Dr Louis Meckel clamped off bilateral  Nephrostomy drains, 200 discarded on right and 300 discarded on left .

## 2018-04-22 NOTE — Anesthesia Procedure Notes (Signed)
Procedure Name: LMA Insertion Date/Time: 04/22/2018 8:40 AM Performed by: Wanita Chamberlain, CRNA Pre-anesthesia Checklist: Patient identified, Timeout performed, Emergency Drugs available, Suction available and Patient being monitored Patient Re-evaluated:Patient Re-evaluated prior to induction Oxygen Delivery Method: Circle system utilized Preoxygenation: Pre-oxygenation with 100% oxygen Induction Type: IV induction Ventilation: Mask ventilation without difficulty LMA: LMA inserted LMA Size: 4.0 Number of attempts: 1 Placement Confirmation: CO2 detector,  positive ETCO2 and breath sounds checked- equal and bilateral Tube secured with: Tape Dental Injury: Teeth and Oropharynx as per pre-operative assessment

## 2018-04-22 NOTE — Discharge Instructions (Signed)
[facility] [phone] Post Ureteroscopy With Stent Instructions  Definitions:  Ureter: The duct that transports urine from the kidney to the bladder. Stent:   A plastic hollow tube that is placed into the ureter, from the kidney to the bladder to prevent the ureter from swelling shut.  GENERAL INSTRUCTIONS:  Despite the fact that no skin incisions were used, the area around the ureter and bladder is raw and irritated. The stent is a foreign body which will further irritate the bladder wall. This irritation is manifested by increased frequency of urination, both day and night, and by an increase in the urge to urinate. In some, the urge to urinate is present almost always. Sometimes the urge is strong enough that you may not be able to stop yourself from urinating. The only real cure is to remove the stent and then give time for the bladder wall to heal which can't be done until the danger of the ureter swelling shut has passed, which varies.  You may see some blood in your urine while the stent is in place and a few days afterwards. Do not be alarmed, even if the urine was clear for a while. Get off your feet and drink lots of fluids until clearing occurs. If you start to pass clots or don't improve, call us.  DIET: You may return to your normal diet immediately. Because of the raw surface of your bladder, alcohol, spicy foods, acid type foods and drinks with caffeine may cause irritation or frequency and should be used in moderation. To keep your urine flowing freely and to avoid constipation, drink plenty of fluids during the day ( 8-10 glasses ). Tip: Avoid cranberry juice because it is very acidic.  ACTIVITY: Your physical activity doesn't need to be restricted. However, if you are very active, you may see some blood in your urine. We suggest that you reduce your activity under these circumstances until the bleeding has stopped.  BOWELS: It is important to keep your  bowels regular during the postoperative period. Straining with bowel movements can cause bleeding. A bowel movement every other day is reasonable. Use a mild laxative if needed, such as Milk of Magnesia 2-3 tablespoons, or 2 Dulcolax tablets. Call if you continue to have problems. If you have been taking narcotics for pain, before, during or after your surgery, you may be constipated. Take a laxative if necessary.   MEDICATION: You should resume your pre-surgery medications unless told not to. In addition you will often be given an antibiotic to prevent infection. These should be taken as prescribed until the bottles are finished unless you are having an unusual reaction to one of the drugs.  PROBLEMS YOU SHOULD REPORT TO [place]:  Fevers over 100.5 Fahrenheit.  Heavy bleeding, or clots ( See above notes about blood in urine ).  Inability to urinate.  Drug reactions ( hives, rash, nausea, vomiting, diarrhea ).  Severe burning or pain with urination that is not improving.  Post Anesthesia Home Care Instructions  Activity: Get plenty of rest for the remainder of the day. A responsible individual must stay with you for 24 hours following the procedure.  For the next 24 hours, DO NOT: -Drive a car -Paediatric nurse -Drink alcoholic beverages -Take any medication unless instructed by your physician -Make any legal decisions or sign important papers.  Meals: Start with liquid foods such as gelatin or soup. Progress to regular foods as tolerated. Avoid greasy, spicy, heavy foods. If nausea and/or vomiting  occur, drink only clear liquids until the nausea and/or vomiting subsides. Call your physician if vomiting continues.  Special Instructions/Symptoms: Your throat may feel dry or sore from the anesthesia or the breathing tube placed in your throat during surgery. If this causes discomfort, gargle with warm salt water. The discomfort should disappear within 24 hours.  If you had a scopolamine  patch placed behind your ear for the management of post- operative nausea and/or vomiting:  1. The medication in the patch is effective for 72 hours, after which it should be removed.  Wrap patch in a tissue and discard in the trash. Wash hands thoroughly with soap and water. 2. You may remove the patch earlier than 72 hours if you experience unpleasant side effects which may include dry mouth, dizziness or visual disturbances. 3. Avoid touching the patch. Wash your hands with soap and water after contact with the patch.

## 2018-04-22 NOTE — Transfer of Care (Signed)
Immediate Anesthesia Transfer of Care Note  Patient: Amy Morrow  Procedure(s) Performed: CYSTOSCOPY WITH BILATERAL RETROGRADE PYELOGRAM/ AND URETERAL STENT EXCHANGE, vaginal exam (Bilateral Renal)  Patient Location: PACU  Anesthesia Type:General  Level of Consciousness: awake, alert , oriented and patient cooperative  Airway & Oxygen Therapy: Patient Spontanous Breathing and Patient connected to nasal cannula oxygen  Post-op Assessment: Report given to RN and Post -op Vital signs reviewed and stable  Post vital signs: Reviewed and stable  Last Vitals:  Vitals Value Taken Time  BP    Temp    Pulse 89 04/22/2018  9:34 AM  Resp    SpO2 100 % 04/22/2018  9:34 AM  Vitals shown include unvalidated device data.  Last Pain:  Vitals:   04/22/18 0710  TempSrc:   PainSc: 0-No pain      Patients Stated Pain Goal: 8 (56/81/27 [id number])  Complications: No apparent anesthesia complications

## 2018-04-22 NOTE — Anesthesia Preprocedure Evaluation (Addendum)
Anesthesia Evaluation  Patient identified by MRN, date of birth, ID band Patient awake    Reviewed: Allergy & Precautions, NPO status , Patient's Chart, lab work & pertinent test results  History of Anesthesia Complications (+) PONV and history of anesthetic complications  Airway Mallampati: I  TM Distance: >3 FB Neck ROM: Full    Dental no notable dental hx. (+) Teeth Intact, Dental Advisory Given   Pulmonary shortness of breath and with exertion,    Pulmonary exam normal breath sounds clear to auscultation       Cardiovascular negative cardio ROS Normal cardiovascular exam Rhythm:Regular Rate:Normal     Neuro/Psych negative neurological ROS  negative psych ROS   GI/Hepatic negative GI ROS, Neg liver ROS,   Endo/Other  diabetes, Well Controlled, Type 2  Renal/GU negative Renal ROS   Ureterovaginal fistula    Musculoskeletal negative musculoskeletal ROS (+)   Abdominal   Peds  Hematology  (+) anemia ,   Anesthesia Other Findings   Reproductive/Obstetrics                            Anesthesia Physical Anesthesia Plan  ASA: II  Anesthesia Plan: General   Post-op Pain Management:    Induction: Intravenous  PONV Risk Score and Plan: 4 or greater and Scopolamine patch - Pre-op, Midazolam, Ondansetron, Dexamethasone and Treatment may vary due to age or medical condition  Airway Management Planned: LMA  Additional Equipment:   Intra-op Plan:   Post-operative Plan: Extubation in OR  Informed Consent: I have reviewed the patients History and Physical, chart, labs and discussed the procedure including the risks, benefits and alternatives for the proposed anesthesia with the patient or authorized representative who has indicated his/her understanding and acceptance.   Dental advisory given  Plan Discussed with: CRNA and Surgeon  Anesthesia Plan Comments:         Anesthesia  Quick Evaluation

## 2018-04-22 NOTE — Interval H&P Note (Signed)
History and Physical Interval Note:  04/22/2018 8:25 AM  Amy Morrow  has presented today for surgery, with the diagnosis of URETEROVAGINAL FISTULA  The various methods of treatment have been discussed with the patient and family. After consideration of risks, benefits and other options for treatment, the patient has consented to  Procedure(s): CYSTOSCOPY WITH BILATERAL RETROGRADE PYELOGRAM/ AND URETERAL STENT EXCHANGE (Bilateral) as a surgical intervention .  The patient's history has been reviewed, patient examined, no change in status, stable for surgery.  I have reviewed the patient's chart and labs.  Questions were answered to the patient's satisfaction.     Louis Meckel W

## 2018-04-22 NOTE — H&P (Signed)
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   The patient underwent a radical hysterectomy and pelvic lymphadenectomy around 02/09/18 for cervical cancer. Her postoperative course was largely unremarkable, however she then developed dehiscence of her vaginal cuff and significant drainage. She was ultimately noted to have bilateral ureteral injuries. She was taken to the operating room on 02/16/18 for bilateral stent placement. At the time, the distal aspect of both ureters was noted to be ischemic. Following stent placement she continued to have severe leakage. Ultimately, 5 or 6 days ago she had bilateral nephrostomy tubes placed. Since that time she notes that she has had significant improvement in her leakage. She was mostly dry, but did have some leakage this morning. Otherwise, she is starting to dry out, things are improving for her. Her Foley catheter has been removed.   The patient the moment is not complaining of any dysuria, flank pain, or fever/chills.     ALLERGIES: No Allergies    MEDICATIONS: Metformin Hcl  Nitrofurantoin     GU PSH: Cystoscopy Insert Stent, Bilateral - 02/16/2018 Cystoscopy Ureteroscopy, Right - 02/16/2018      PSH Notes: Preventive medication therapy needed, Bladder Surgery   NON-GU PSH: None   GU PMH: Other microscopic hematuria, Microscopic hematuria - 2014 Urge incontinence, Urge incontinence of urine - 2014 Urinary Frequency, Increased urinary frequency - 2014 Urinary Urgency, Urinary urgency - 2014      PMH Notes:  1898-08-04 00:00:00 - Note: Normal Routine History And Physical Adult      pre-diabetic 2019   NON-GU PMH: Anxiety, Anxiety (Symptom) - 2014 Personal history of other diseases of the nervous system and sense organs, History of sleep apnea - 2014    FAMILY HISTORY: Family Health Status - Father alive at age 59 - 60 In Family Family Health Status - Mother's Age - Mother Family Health  Status Number - Runs In Family nephrolithiasis - Mother   SOCIAL HISTORY: Marital Status: Married Preferred Language: Spanish; Education officer, community; Ethnicity: Not Hispanic Or Latino; Race: Other Race Current Smoking Status: Patient has never smoked.   Tobacco Use Assessment Completed: Used Tobacco in last 30 days? Does not drink anymore.  Drinks 1 caffeinated drink per day.     Notes: Tobacco Use, Alcohol Use, Caffeine Use, Occupation: Agricultural engineer, Marital History - Currently Married   REVIEW OF SYSTEMS:    GU Review Female:   Patient denies frequent urination, hard to postpone urination, burning /pain with urination, get up at night to urinate, leakage of urine, stream starts and stops, trouble starting your stream, have to strain to urinate, and being pregnant.  Gastrointestinal (Upper):   Patient denies nausea, vomiting, and indigestion/ heartburn.  Gastrointestinal (Lower):   Patient denies constipation and diarrhea.  Constitutional:   Patient denies fever, night sweats, weight loss, and fatigue.  Skin:   Patient denies skin rash/ lesion and itching.  Eyes:   Patient denies blurred vision and double vision.  Ears/ Nose/ Throat:   Patient denies sore throat and sinus problems.  Hematologic/Lymphatic:   Patient denies swollen glands and easy bruising.  Cardiovascular:   Patient denies leg swelling and chest pains.  Respiratory:   Patient denies cough and shortness of breath.  Endocrine:   Patient denies excessive thirst.  Musculoskeletal:   Patient denies back pain and joint pain.  Neurological:   Patient denies headaches and dizziness.  Psychologic:   Patient denies depression and anxiety.   VITAL SIGNS:  03/22/2018 12:01 PM  Weight 142 lb / 64.41 kg  Height 49 in / 124.46 cm  BP 97/66 mmHg  Pulse 98 /min  Temperature 98.6 F / 37 C  BMI 41.6 kg/m   GU PHYSICAL EXAMINATION:      Notes: The patient's nephrostomy tubes are secured with dry bandages. The urine in the bag is clear  yellow.   MULTI-SYSTEM PHYSICAL EXAMINATION:    Constitutional: Well-nourished. No physical deformities. Normally developed. Good grooming.  Neck: Neck symmetrical, not swollen. Normal tracheal position.  Respiratory: No labored breathing, no use of accessory muscles.   Cardiovascular: Normal temperature, normal extremity pulses, no swelling, no varicosities.  Lymphatic: No enlargement of neck, axillae, groin.  Skin: No paleness, no jaundice, no cyanosis. No lesion, no ulcer, no rash.  Neurologic / Psychiatric: Oriented to time, oriented to place, oriented to person. No depression, no anxiety, no agitation.  Gastrointestinal: No mass, no tenderness, no rigidity, non obese abdomen.  Eyes: Normal conjunctivae. Normal eyelids.  Ears, Nose, Mouth, and Throat: Left ear no scars, no lesions, no masses. Right ear no scars, no lesions, no masses. Nose no scars, no lesions, no masses. Normal hearing. Normal lips.  Musculoskeletal: Normal gait and station of head and neck.     PAST DATA REVIEWED:  Source Of History:  Patient  Records Review:   Previous Doctor Records, Previous Hospital Records, Previous Patient Records, POC Tool  X-Ray Review: C.T. Abdomen/Pelvis: Reviewed Films. Discussed With Patient.     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 The right ureteral stent was visible on pelvic exam through the vaginal opening on the most recent exam by Dr. Denman George. Fortunately, nephrostomy tubes have been placed and the patient is drying up. If we can get her dry, she may well healed.   Our plan moving forward is to take the patient to the  operating room in 1 month for cystoscopy and bilateral retrograde pyelograms. Hopefully, the patient will heal on her own with conservative management. I am cautiously optimistic given that she is now dry with the nephrostomy tubes. She does have a chance of healing this on her own. However, at some point, she may well need ureteral reimplants.

## 2018-04-22 NOTE — Op Note (Signed)
Preoperative diagnosis:  1. 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 Same  Procedure: 1. Cystoscopy 2. Bilateral retrograde pyelogram with interpretation 3. Bilateral ureteral stent exchange 4. Left ureteroscopy, diagnostic  Surgeon: Ardis Hughs, MD  Anesthesia: General  Complications: None  Intraoperative findings:  #1: On speculum exam there was a tine from the right ureteral stent (Polaris) noted at the vaginal cuff.  The remainder of the vaginal cuff was healed, including the area around the ureteral stent tine. 2.:  The right retrograde pyelogram was performed using 10 cc of Omni, and demonstrated normal caliber ureter with no significant hydroureteronephrosis.  There did not appear to be any communication or extravasation of contrast in the distal ureter or UVJ with the vagina. 3.:  The left retrograde pyelogram demonstrated a small area of extravasation at the UVJ, nearly the intramural ureter, that seemed to communicate with the patient's vagina.  There were no other significant filling defects, there is no hydroureteronephrosis. 4.:  Left diagnostic ureteroscopy demonstrated an abnormality within the intramural or very distal UVJ region where there was some bullous edema and likely the site of the fistula.  There was no identifiable communication.  The remaining aspect of the ureter was normal.  EBL: Minimal  Specimens: None  Indication: Amy Morrow is a 57 y.o. patient with cervical cancer who is status post pelvic exoneration.  During that procedure the patient demonstrated thermal injuries of the ureters bilaterally and subsequently developed urine extravasation and bilateral ureteral vaginal fistulas.  Stents were placed in the ureters as well as nephrostomy tubes in her kidneys.  Over the course of the last 2 months the leakage has dried up significantly.  She presents today for reevaluation of the ureters.  After  reviewing the management options for treatment, he elected to proceed with the above surgical procedure(s). We have discussed the potential benefits and risks of the procedure, side effects of the proposed treatment, the likelihood of the patient achieving the goals of the procedure, and any potential problems that might occur during the procedure or recuperation. Informed consent has been obtained.  Description of procedure:  The patient was taken to the operating room and general anesthesia was induced.  The patient was placed in the dorsal lithotomy position, prepped and draped in the usual sterile fashion, and preoperative antibiotics were administered. A preoperative time-out was performed.   Using a small vaginal speculum pelvic exam was performed which demonstrated a small rubber band like time from the Polaris stent which ultimately was emanating from the patient's right ureter.  The remaining aspect of the vaginal cuff was intact, and very healthy-appearing.  21 French 30 degrees cystoscope was gently passed through the patient urethra and into the bladder under visual guidance.  Cystoscopy demonstrated no fistulae or any other mucosal abnormalities.  I then grabbed the bands emanating from the patient's right ureter that were connected to her stent and pulled it to the urethral meatus.  Using a 0.38 sensor wire advanced the wire through the stent and up into the right renal pelvis removing the stent over the wire.  I then used a dual-lumen catheter and cannulated the patient's right ureteral orifice and performed a retrograde pyelogram with the above findings.  I then backloaded the wire through the cystoscope and under fluoroscopic guidance advanced a 5 French x22 cm Polaris ureteral stent over the wire and into the right renal pelvis.  Once the stent was noted to be appropriately positioned  in the pelvis and backed the scope to the bladder neck and advanced the stent to the right ureteral orifice  before removing the wire entirely.  Fluoroscopy confirmed a well-positioned stent in the right kidney.  I then turned my attention to the patient's left side and with a stent grasper to grab the tines emanating from the left ureteral orifice and pulled the stent to the urethral meatus.  I then used a 0.38 sensor wire and advanced through the stent and into the left renal pelvis.  I then advanced a dual-lumen catheter over the wire and cannulated the left ureteral orifice.  I then performed a left retrograde pyelogram with the above findings.  Seeing that there was extravasation I opted to proceed with diagnostic ureteroscopy.  I used a semirigid short ureteroscope and advanced it through the bladder into the left ureter.  There were no significant defects, but there was an area of bullous edema around the intravesical or distal UVJ ureter, likely the site of the small persistent fistula.  At this point, I advanced the scope beyond this and found no other significant abnormalities.  I then removed the ureteroscope and exchanged for the cystoscope which I advanced in over a wire.  I then passed a 22 cm x5 French Polaris stent over the wire and into the left renal pelvis.  Once the stent was noted to be in the renal pelvis I backed the scope up to the bladder neck and advanced the stent to the left ureteral orifice before removing the wire.  Fluoroscopy confirmed a nice curl in the left kidney.  The bladder was subsequently emptied.  At this point the case was terminated.  The patient was subsequently extubated and returned the PACU in good condition.  Plan: The patient will be brought back to the operating room in 1 month to reevaluate her ureters bilaterally.  Ardis Hughs, M.D.

## 2018-04-23 ENCOUNTER — Encounter (HOSPITAL_BASED_OUTPATIENT_CLINIC_OR_DEPARTMENT_OTHER): Payer: Self-pay | Admitting: Urology

## 2018-04-23 ENCOUNTER — Other Ambulatory Visit: Payer: Self-pay | Admitting: Urology

## 2018-04-23 ENCOUNTER — Telehealth: Payer: Self-pay | Admitting: *Deleted

## 2018-04-23 NOTE — Telephone Encounter (Signed)
Lynette from [facility] called requesting the consent form from 7/8. Faxed the form to [phone], also she can be called at [phone] ext [phone]

## 2018-04-27 ENCOUNTER — Other Ambulatory Visit (HOSPITAL_COMMUNITY): Payer: Self-pay | Admitting: Interventional Radiology

## 2018-04-27 ENCOUNTER — Ambulatory Visit (HOSPITAL_COMMUNITY)
Admission: RE | Admit: 2018-04-27 | Discharge: 2018-04-27 | Disposition: A | Discharge status: Home / Self Care | Payer: Medicaid Other | Source: Ambulatory Visit | Attending: Interventional Radiology | Admitting: Interventional Radiology

## 2018-04-27 ENCOUNTER — Encounter (HOSPITAL_COMMUNITY): Payer: Self-pay | Admitting: Interventional Radiology

## 2018-04-27 DIAGNOSIS — Y732 Prosthetic and other implants, materials and accessory gastroenterology and urology devices associated with adverse incidents: Secondary | ICD-10-CM | POA: Diagnosis not present

## 2018-04-27 DIAGNOSIS — Z9889 Other specified postprocedural states: Secondary | ICD-10-CM

## 2018-04-27 DIAGNOSIS — T83032A Leakage of nephrostomy catheter, initial encounter: Secondary | ICD-10-CM | POA: Insufficient documentation

## 2018-04-27 HISTORY — PX: IR NEPHROSTOMY EXCHANGE RIGHT: IMG6070

## 2018-04-27 HISTORY — PX: IR NEPHROSTOMY EXCHANGE LEFT: IMG6069

## 2018-04-27 MED ORDER — LIDOCAINE-EPINEPHRINE (PF) 2 %-1:200000 IJ SOLN
INTRAMUSCULAR | Status: AC
  Filled 2018-04-27: qty 20

## 2018-04-27 MED ORDER — LIDOCAINE HCL 1 % IJ SOLN
INTRAMUSCULAR | Status: AC | PRN
  Administered 2018-04-27: 5 mL via INTRADERMAL

## 2018-04-27 MED ORDER — IOPAMIDOL (ISOVUE-300) INJECTION 61%
INTRAVENOUS | Status: AC
  Administered 2018-04-27: 20 mL
  Filled 2018-04-27: qty 50

## 2018-04-27 MED ORDER — IOPAMIDOL (ISOVUE-300) INJECTION 61%
50.0000 mL | Freq: Once | INTRAVENOUS | Status: AC | PRN
  Administered 2018-04-27: 20 mL

## 2018-04-27 NOTE — Procedures (Signed)
Pre Procedure Dx: Ureteral leak Post Procedure Dx: Same  Successful bilateral PCN exchange.    EBL: None   No immediate complications.   Ronny Bacon, MD Pager #: [phone]

## 2018-05-04 ENCOUNTER — Ambulatory Visit: Payer: Medicaid Other | Attending: Family Medicine | Admitting: Family Medicine

## 2018-05-04 ENCOUNTER — Encounter: Payer: Self-pay | Admitting: Family Medicine

## 2018-05-04 VITALS — BP 121/77 | HR 91 | Temp 98.2°F | Resp 18 | Ht 59.0 in | Wt 140.0 lb

## 2018-05-04 DIAGNOSIS — M545 Low back pain, unspecified: Secondary | ICD-10-CM

## 2018-05-04 DIAGNOSIS — Z8249 Family history of ischemic heart disease and other diseases of the circulatory system: Secondary | ICD-10-CM | POA: Diagnosis not present

## 2018-05-04 DIAGNOSIS — N3001 Acute cystitis with hematuria: Secondary | ICD-10-CM

## 2018-05-04 DIAGNOSIS — Z23 Encounter for immunization: Secondary | ICD-10-CM

## 2018-05-04 DIAGNOSIS — Z960 Presence of urogenital implants: Secondary | ICD-10-CM | POA: Insufficient documentation

## 2018-05-04 DIAGNOSIS — Z8541 Personal history of malignant neoplasm of cervix uteri: Secondary | ICD-10-CM | POA: Insufficient documentation

## 2018-05-04 DIAGNOSIS — E119 Type 2 diabetes mellitus without complications: Secondary | ICD-10-CM | POA: Diagnosis not present

## 2018-05-04 DIAGNOSIS — E785 Hyperlipidemia, unspecified: Secondary | ICD-10-CM | POA: Diagnosis not present

## 2018-05-04 DIAGNOSIS — D649 Anemia, unspecified: Secondary | ICD-10-CM | POA: Insufficient documentation

## 2018-05-04 DIAGNOSIS — Z9851 Tubal ligation status: Secondary | ICD-10-CM | POA: Diagnosis not present

## 2018-05-04 DIAGNOSIS — R1031 Right lower quadrant pain: Secondary | ICD-10-CM | POA: Insufficient documentation

## 2018-05-04 DIAGNOSIS — Z9889 Other specified postprocedural states: Secondary | ICD-10-CM | POA: Diagnosis not present

## 2018-05-04 DIAGNOSIS — R7303 Prediabetes: Secondary | ICD-10-CM

## 2018-05-04 DIAGNOSIS — Z90710 Acquired absence of both cervix and uterus: Secondary | ICD-10-CM | POA: Diagnosis not present

## 2018-05-04 DIAGNOSIS — N821 Other female urinary-genital tract fistulae: Secondary | ICD-10-CM

## 2018-05-04 DIAGNOSIS — R5383 Other fatigue: Secondary | ICD-10-CM

## 2018-05-04 LAB — POCT URINALYSIS DIP (CLINITEK)
Bilirubin, UA: NEGATIVE
Glucose, UA: NEGATIVE mg/dL
Ketones, POC UA: NEGATIVE mg/dL
Nitrite, UA: NEGATIVE
Spec Grav, UA: 1.02
Urobilinogen, UA: 0.2 U/dL
pH, UA: 7

## 2018-05-04 MED ORDER — AMOXICILLIN-POT CLAVULANATE 500-125 MG PO TABS: 1.0000 | ORAL_TABLET | Freq: Two times a day (BID) | ORAL | 0 refills | Status: DC

## 2018-05-04 NOTE — Patient Instructions (Signed)
Infeccin urinaria en los adultos  (Urinary Tract Infection, Adult)  Una infeccin urinaria (IU) puede ocurrir en cualquier lugar de las vas urinarias. Las vas urinarias incluyen lo siguiente:   Riones.   Urteres.   Vejiga.   Uretra.  Estos rganos fabrican, almacenan y eliminan la orina del organismo.  CUIDADOS EN EL HOGAR   Tome los medicamentos de venta libre y los recetados solamente como se lo haya indicado el mdico.   Si le recetaron un antibitico, tmelo como se lo haya indicado el mdico. No deje de tomar los antibiticos aunque comience a sentirse mejor.   Evite beber lo siguiente:  ? Alcohol.  ? Cafena.  ? T.  ? Bebidas con gas.   Beba suficiente lquido para mantener el pis claro o de color amarillo plido.   Concurra a todas las visitas de control como se lo haya indicado el mdico. Esto es importante.   Asegrese de lo siguiente:  ? Vaciar la vejiga con frecuencia y en su totalidad. No contener la orina durante largos perodos.  ? Vaciar la vejiga antes y despus de tener relaciones sexuales.  ? Limpiar de adelante hacia atrs despus de defecar, si es mujer. Usar cada trozo de papel una vez cuando se limpie.  SOLICITE AYUDA SI:   Siente dolor en la espalda.   Tiene fiebre.   Siente malestar estomacal (nuseas).   Vomita.   Los sntomas no mejoran despus de 3das de tratamiento.   Los sntomas desaparecen y luego reaparecen.  SOLICITE AYUDA DE INMEDIATO SI:   Siente un dolor muy intenso en la espalda.   Siente un dolor muy intenso en la parte inferior del abdomen.   Tiene vmitos y no puede retener los medicamentos ni el agua.  Esta informacin no tiene como fin reemplazar el consejo del mdico. Asegrese de hacerle al mdico cualquier pregunta que tenga.  Document Released: 01/08/2010 Document Revised: 11/12/2015 Document Reviewed: 06/11/2015  Elsevier Interactive Patient Education  2018 Elsevier Inc.

## 2018-05-04 NOTE — Progress Notes (Signed)
Subjective:    Patient ID: Amy Morrow, female    DOB: 1961-03-18, 57 y.o.   MRN: [id number]   Due to a language barrier, patient is accompanied by a live interpreter at today's visit.   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  Patient was noted to have injury to the ureter status post surgery and required placement of ureteral stents.  Patient is status post recent placement of nephrostomy tubes.  Patient states that she felt fine until just after she had new procedure to replace her ureteral stents which was done on the 04/22/18.  Per the chart, patient had cystoscopy with bilateral retrograde pyelogram and ureteral stent exchange.  Patient states that about 2 weeks ago she began to have onset of dull, aching in her mid to lower back.  Patient states that her back pain is about a 7-8 on a 0-to-10 scale and patient has also developed some right lower abdominal pain over the past 2 to 3 days.  Patient states that the lower abdominal pain is a 6-7.  Lower abdominal pain comes and goes and is sharp.  Patient has felt very fatigued as if  she has no energy.  Patient denies any fever or chills and patient has had no nausea.  Per chart, patient was supposed to be on daily Macrodantin for prophylaxis due to uterovaginal fistula status post episode prior hospitalization for urosepsis.  Patient however it does not believe that she has been taking this medication.  Patient does not recall getting this medication filled.      Patient reports that she is also been told that she is prediabetic.  Patient is currently taking metformin and patient wishes to know if she needs to continue this medication.  Patient denies any increased thirst, no blurred vision and no urinary  frequency.  Patient states that her family history is significant for diabetes and hypertension. Past Medical History:  Diagnosis Date  . Abnormal Pap smear   . Anemia   . Cervical cancer [facility]) oncologist- dr Denman George   Stage IB1  SCCa   . Diabetes mellitus without complication ([place])   . Dyspnea   . Fatigue   . Hyperlipidemia   . Left breast mass 2013   7x4x4 mm 6o'clock  . PONV (postoperative nausea and vomiting)   . Sepsis ([place]) 03/2018   Urosepsis, Resolved  . Thyroid nodule 03/25/2018   inferior right thyroid, noted on US thyroid  . Ureterovaginal fistula   . Urgency of urination    intermittant  . Urinary frequency    Past Surgical History:  Procedure Laterality Date  . BREAST BIOPSY Left 06/15/2012  . CYSTOSCOPY W/ RETROGRADES Bilateral 02/16/2018   Procedure: CYSTOSCOPY WITH RETROGRADE PYELOGRAM BILATERAL DIAGNOSTIC URETEROSCOPY, BILATERAL  STENT PLACEMENT;  Surgeon: Ardis Hughs, MD;  Location: [facility];  Service: Urology;  Laterality: Bilateral;  . CYSTOSCOPY W/ URETERAL STENT PLACEMENT Bilateral 04/22/2018   Procedure: CYSTOSCOPY WITH BILATERAL RETROGRADE PYELOGRAM/ AND URETERAL STENT EXCHANGE, vaginal exam;  Surgeon: Ardis Hughs, MD;  Location: [facility];  Service: Urology;  Laterality: Bilateral;  . D & C LEEP CONIZATION BX  07-31-2003   dr p. rose [facility]  . IR NEPHROSTOGRAM RIGHT THRU EXISTING ACCESS  03/18/2018  . IR  NEPHROSTOMY EXCHANGE LEFT  04/27/2018  . IR NEPHROSTOMY EXCHANGE RIGHT  04/27/2018  . IR NEPHROSTOMY PLACEMENT LEFT  03/18/2018  . IR NEPHROSTOMY PLACEMENT RIGHT  03/02/2018  . PELVIC LYMPH NODE DISSECTION Bilateral 02/08/2018   Procedure: BILATEAL PEVIC  LYMPHADENECTOMY;  Surgeon: [name], MD;  [facility]: [facility];  Service: Gynecology;  Laterality: Bilateral;  . ROBOTIC ASSISTED TOTAL HYSTERECTOMY WITH BILATERAL SALPINGO OOPHERECTOMY N/A 02/08/2018   Procedure: XI ROBOTIC ASSISTED TOTAL Radical HYSTERECTOMY WITH BILATERAL SALPINGO  OOPHORECTOMY;  Surgeon: [name], MD;  Location: [facility];  Service: Gynecology;  Laterality: N/A;  . Transvaginal tape placement  03-12-2009  dr Emeterio Reeve  [facility]   GYNECARE TENSION-FREE VAGINAL TAPE SLING  . TUBAL LIGATION  05-28-2005  DR  MARSHALL  @ [facility]   PPTL   Family History  Problem Relation Age of Onset  . Hypertension Mother   . Heart disease Mother   . Hypertension Brother   . Heart disease Brother    Social History   Tobacco Use  . Smoking status: Never Smoker  . Smokeless tobacco: Never Used  Substance Use Topics  . Alcohol use: No  . Drug use: No  No Known Allergies     Review of Systems  Constitutional: Positive for fatigue. Negative for chills and fever.  Respiratory: Negative for cough and shortness of breath.   Cardiovascular: Negative for chest pain, palpitations and leg swelling.  Gastrointestinal: Positive for abdominal pain. Negative for nausea.  Endocrine: Negative for polydipsia, polyphagia and polyuria.  Genitourinary: Negative for dysuria and frequency.  Musculoskeletal: Positive for back pain. Negative for gait problem and joint swelling.  Neurological: Negative for dizziness and headaches.       Objective:   Physical Exam BP 121/77 (BP Location: Left Arm, Patient Position: Sitting, Cuff Size: Normal)   Pulse 91   Temp 98.2 F (36.8 C) (Oral)   Resp 18   Ht 4\' 11"  (1.499 m)   Wt 140 lb (63.5 kg)   LMP 11/16/2012   SpO2 99%   BMI 28.28 kg/m  Nurse's notes and vital signs reviewed General- well-nourished, well-developed older female in no acute distress.  Patient is accompanied by an interpreter Lungs-clear to auscultation bilaterally Cardiovascular-regular rate and rhythm Back- patient with presence of bilateral nephrostomy tubes.  Patient with bilateral CVA tenderness Abdomen- patient with right lower quadrant and suprapubic discomfort to palpation, no rebound or guarding Extremities-no edema       Assessment & Plan:  1. Acute  cystitis with hematuria Patient with low back pain of recent onset and has history of ureterovaginal fistula and history of urosepsis.  Patient had urinalysis which showed large leukocytes and moderate blood.  Patient will be placed on Augmentin 500 mg twice daily x10 days to take after eating.  Urine will be sent for culture.  Patient also has follow-up this Thursday with her urologist.  Patient will have CBC and BMP done in follow-up of her urinary tract infection and history of urosepsis.  Patient should return or seek medical follow-up if she has fever, worsening of back pain, worsening of abdominal pain or any concerns. - amoxicillin-clavulanate (AUGMENTIN) 500-125 MG tablet; Take 1 tablet (500 mg total) by mouth 2 (two) times daily. Take after eating  Dispense: 20 tablet; Refill: 0 - Urine Culture - CBC with Differential  2. Acute bilateral low back pain without sciatica Patient with recent onset of bilateral back pain after recent cystoscopy and change of ureteral stents.  Patient will have urinalysis  to look for possible urinary tract infection.  Patient may continue her home medications as needed for pain. - POCT URINALYSIS DIP (CLINITEK)  3. Ureterovaginal fistula Patient with history of a ureterovaginal fistula and will have CBC done in follow-up as well as urinalysis to look for urinary tract infection. - POCT URINALYSIS DIP (CLINITEK)  4. Fatigue, unspecified type Patient with complaint of recent onset of fatigue.  Patient will have UA to look for urinary tract infection as well as CMP and CBC to look for other causes of her fatigue including electrolyte or liver enzyme abnormality as well as anemia/infection. - Comprehensive metabolic panel - CBC with Differential  5. Pre-diabetes Patient reports a history of prediabetes and on review of chart, it appears that patient had an improvement in her hemoglobin A1c from 6.2 and 2 months down to 5.5 on 03/18/2018.  Patient was told that she  should continue the use of the metformin.  Hemoglobin A1c can be repeated in mid November or later.  6. Need for immunization against influenza Patient was offered and agreed to have influenza immunization at today's visit.  Patient was also given informational handout regarding immunization.  An After Visit Summary was printed and given to the patient.  Return in about 4 weeks (around 06/01/2018), or if symptoms worsen or fail to improve-1-2 weeks, for fatigue.

## 2018-05-05 ENCOUNTER — Telehealth: Payer: Self-pay | Admitting: *Deleted

## 2018-05-05 LAB — COMPREHENSIVE METABOLIC PANEL WITH GFR
ALT: 12 IU/L (ref 0–32)
AST: 13 IU/L (ref 0–40)
Albumin/Globulin Ratio: 1.4 (ref 1.2–2.2)
Albumin: 4.3 g/dL (ref 3.5–5.5)
Alkaline Phosphatase: 102 IU/L (ref 39–117)
BUN/Creatinine Ratio: 22 (ref 9–23)
BUN: 11 mg/dL (ref 6–24)
Bilirubin Total: <0.2 mg/dL (ref 0.0–1.2)
CO2: 26 mmol/L (ref 20–29)
Calcium: 9 mg/dL (ref 8.7–10.2)
Chloride: 100 mmol/L (ref 96–106)
Creatinine, Ser: 0.5 mg/dL — ABNORMAL LOW (ref 0.57–1.00)
GFR calc Af Amer: 126 mL/min/1.73
GFR calc non Af Amer: 109 mL/min/1.73
Globulin, Total: 3 g/dL (ref 1.5–4.5)
Glucose: 120 mg/dL — ABNORMAL HIGH (ref 65–99)
Potassium: 3.8 mmol/L (ref 3.5–5.2)
Sodium: 140 mmol/L (ref 134–144)
Total Protein: 7.3 g/dL (ref 6.0–8.5)

## 2018-05-05 LAB — CBC WITH DIFFERENTIAL/PLATELET
Basophils Absolute: 0 x10E3/uL (ref 0.0–0.2)
Basos: 1 %
EOS (ABSOLUTE): 0.3 x10E3/uL (ref 0.0–0.4)
Eos: 5 %
Hematocrit: 33.1 % — ABNORMAL LOW (ref 34.0–46.6)
Hemoglobin: 10.5 g/dL — ABNORMAL LOW (ref 11.1–15.9)
Immature Grans (Abs): 0 x10E3/uL (ref 0.0–0.1)
Immature Granulocytes: 0 %
Lymphocytes Absolute: 1.9 x10E3/uL (ref 0.7–3.1)
Lymphs: 31 %
MCH: 27.5 pg (ref 26.6–33.0)
MCHC: 31.7 g/dL (ref 31.5–35.7)
MCV: 87 fL (ref 79–97)
Monocytes Absolute: 0.4 x10E3/uL (ref 0.1–0.9)
Monocytes: 6 %
Neutrophils Absolute: 3.4 x10E3/uL (ref 1.4–7.0)
Neutrophils: 57 %
Platelets: 425 x10E3/uL (ref 150–450)
RBC: 3.82 x10E6/uL (ref 3.77–5.28)
RDW: 13.3 % (ref 12.3–15.4)
WBC: 6 x10E3/uL (ref 3.4–10.8)

## 2018-05-05 NOTE — Telephone Encounter (Signed)
-----   Message from Antony Blackbird, MD sent at 05/05/2018 11:52 AM EDT ----- Notify patient that her glucose was 120 on her CMET which was otherwise normal. CBC with stable anemia with a Hgb of 10.5. Urine culture results are still pending

## 2018-05-05 NOTE — Telephone Encounter (Signed)
Medical Assistant used [employer] to contact patient.  Interpreter Name: Caryl Asp #: [id number] Patient was not available, [employer] left patient a voicemail. Voicemail states to give a call back to [place] with [facility] at [phone]. Patient is aware of CBC being stable and CMP being normal except needing to limit sugar intake to avoid elevated sugar levels.

## 2018-05-06 ENCOUNTER — Telehealth: Payer: Self-pay

## 2018-05-06 ENCOUNTER — Encounter (HOSPITAL_BASED_OUTPATIENT_CLINIC_OR_DEPARTMENT_OTHER): Payer: Self-pay | Admitting: Urology

## 2018-05-06 NOTE — Telephone Encounter (Signed)
The hysterectomy form needs to be mailed to: [place], [place] [address].  Mailed form out as Willette Cluster from [employer] [place] [phone]  requested [place] to call and get info to send form to Medicaid.

## 2018-05-08 LAB — URINE CULTURE

## 2018-05-12 ENCOUNTER — Telehealth: Payer: Self-pay | Admitting: *Deleted

## 2018-05-12 NOTE — Telephone Encounter (Signed)
Medical Assistant used [employer] to contact patient.  Interpreter Name: Elita Quick Interpreter #: [phone] Patient is aware bacteria noted in the blood and to complete antibiotics and follow up with urology.

## 2018-05-12 NOTE — Telephone Encounter (Signed)
-----   Message from Antony Blackbird, MD sent at 05/06/2018 10:47 PM EDT ----- Patient with growth of bacteria on preliminary blood culture. She needs to make sure that she takes the antibiotic and continues urology follow-up

## 2018-05-13 ENCOUNTER — Telehealth: Payer: Self-pay | Admitting: *Deleted

## 2018-05-13 NOTE — Telephone Encounter (Signed)
[employer] called the patient left a message that her appt on 10/14 needs to move form 12pm to 12:30pm.

## 2018-05-17 ENCOUNTER — Inpatient Hospital Stay: Payer: Medicaid Other | Attending: Obstetrics | Admitting: Gynecologic Oncology

## 2018-05-17 ENCOUNTER — Encounter: Payer: Self-pay | Admitting: Gynecologic Oncology

## 2018-05-17 ENCOUNTER — Inpatient Hospital Stay: Payer: Medicaid Other

## 2018-05-17 VITALS — BP 121/78 | HR 85 | Temp 98.9°F | Resp 18 | Ht 59.0 in | Wt 141.9 lb

## 2018-05-17 DIAGNOSIS — C531 Malignant neoplasm of exocervix: Secondary | ICD-10-CM | POA: Insufficient documentation

## 2018-05-17 DIAGNOSIS — Z90710 Acquired absence of both cervix and uterus: Secondary | ICD-10-CM | POA: Diagnosis not present

## 2018-05-17 DIAGNOSIS — C539 Malignant neoplasm of cervix uteri, unspecified: Secondary | ICD-10-CM

## 2018-05-17 DIAGNOSIS — N10 Acute tubulo-interstitial nephritis: Secondary | ICD-10-CM

## 2018-05-17 DIAGNOSIS — N390 Urinary tract infection, site not specified: Secondary | ICD-10-CM | POA: Diagnosis not present

## 2018-05-17 DIAGNOSIS — Z90722 Acquired absence of ovaries, bilateral: Secondary | ICD-10-CM | POA: Diagnosis not present

## 2018-05-17 MED ORDER — NITROFURANTOIN MONOHYD MACRO 100 MG PO CAPS: 100.0000 mg | ORAL_CAPSULE | Freq: Two times a day (BID) | ORAL | 0 refills | Status: DC

## 2018-05-17 MED FILL — NITROFURANTOIN MONO-MCR 100: 100 | 14 days supply | Qty: 28 | Fill #0

## 2018-05-17 NOTE — Patient Instructions (Signed)
Please take the antibiotic tablet every morning and night for 2 weeks until all the tablets are finished, unless Dr Serita Grit office calls you to inform you that you need to change antibiotics.  Please contact her office later in the year to schedule follow-up with her in January, 2020. Her office can be reached at [phone].  If you have questions regarding your urine tract, please contact Dr Herrick's office at [facility].

## 2018-05-17 NOTE — Addendum Note (Signed)
Addended by: Joylene John D on: 05/17/2018 03:55 PM   Modules accepted: Orders

## 2018-05-17 NOTE — Progress Notes (Signed)
Follow-up Note: GYN-ONC  Consult was requested by Dr. Darron Doom, MD   CC:  Chief Complaint  Patient presents with  . 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  Patient was seen with an interpretor.   Assessment/Plan: 1. Stage IB1 cervical carcinoma o S/p radical hysterectomy with pelvic lymphadenectomy July, 2019 o Low risk features, no adjuvant therapy recommended o Recommend surveillance follow-up in 3 months. Pap annually in July.  2. Urinary tract infection/acute pyelonphritis o Check urine culture from neph tubes today o Empiric Rx for macrobid x 2 weeks (will change if resistant on culture) 3. Ureterovaginal fistula (bilateral)  o S/p right nephrostomy tube placement 03/02/18, s/p left nephrostomy tube placement 03/18/18, with improved (decreased drainage) and significant improvement in ureterovaginal fistulae with conservative management.  o Continue premarin daily (oral) to help with vaginal healing and to improve symptoms of hot flashes that are related to surgical menopause. Will consider stopping this after resolution of fistulae. o Continue 100mg  daily macrodantin for prophylaxis given communication between ureters and vagina with foreign bodies (stents) in ureters.  o Dr Burman Nieves from [facility] on consult for this and will see her again later this month with ureteroscopy to re-evaluate for healing/need for reimplantation procedure.   I will see her back in 3 months for surveillance.  HPI: Ms. Charleston Vierling Bhavsar  is a very nice 57 y.o.  P5  She has been undergoing cervical cancer screening through [facility]. 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 01/2012 LSIL + endometrial cells 07/2012 Pap negative 01/2013 Pap negative with HRHPV not detected 01/2015 Pap negative and HRHPV not detected  Was noting postmenopausal bleeding and saw [facility] again. 12/2017 Pap negative now HRHPV detected - Mass noted on exam and she  was referred to the [facility] where Dr. 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   Referred for management. Only complaint is the PMB. Denies pain. States bleeding minimal.  She underwent a PET/CT which showed no apparent metastatic disease. On 02/08/18 she underwent robotic assisted radical hysterectomy, upper vaginectomy, bilateral pelvic lymphadenectomy.  Operative findings were significant for a 2 to 3 cm pedunculated exophytic mass from the right anterior cervix.  There is no clinical involvement of the parametrium and no suspicious nodes.  The surgery was overall fairly uncomplicated there was some increased bleeding encountered during the dissection around the posterior bladder pillars.  At the completion of the procedure the ureters bilaterally appeared intact and were completely skeletonized in the distal third free from all parametrial tissue.  Patient was readmitted on postop day 7 with bilateral ureterovaginal fistulas is confirmed on both CT urogram, as well as cystoscopy with retrograde pyelography with Dr. Louis Meckel.  During that cystoscopic procedure Dr. Louis Meckel placed ureteral stents bilaterally.  The patient was treated with broad-spectrum antibiotics to cover pelvic infection.  She was discharged after a 3-day hospital stay.  On 03/02/18 she underwent right percutaneous nephrostomy tube placement. Immediately after placement she developed fevers and hypotension and was admitted with sepsis from occult pyelonephritis. She received IVF resuscitation and IV antibiotics. She was discharged on 03/06/18. After placement of the right PCN she noted slight decrease in urinary leakage from the vagina, though still persisted.  Therefore placement of a left PCN was placed on 03/18/18 and this improved urinary leakage from the vagina substantially. She was placed on prophylactic nitrofurantoid 100mg  daily.  Interval Hx:  She was followed by  Dr Louis Meckel who  performed ureteroscopy in early October, 2019 and identified resolution of left fistula and almost resolution in right. The stents were replaced with a plan to remove PCN's later this month and then stents if fistulae clinically resolved.  She denies vaginal bleeding and only has small amount of leakage at night. She reports hot flashes and anxiety symptoms.   She has new symptoms of 3 days of chills and "unbearable" back pains. Her stents were changed 3 weeks ago and the pain has been worse since that time. She reports "pus" from her urethra (occasionally).    Current Meds:  Outpatient Encounter Medications as of 05/17/2018  Medication Sig  . acetaminophen (TYLENOL) 500 MG tablet Take 500 mg by mouth every 6 (six) hours as needed for fever.  . estrogens, conjugated, (PREMARIN) 0.9 MG tablet Take 1 tablet (0.9 mg total) by mouth daily. Take daily for 21 days then do not take for 7 days.  . metFORMIN (GLUCOPHAGE XR) 500 MG 24 hr tablet Take 1 tablet (500 mg total) by mouth daily with breakfast.  . polyethylene glycol (MIRALAX / GLYCOLAX) packet Take 17 g by mouth daily.  . nitrofurantoin, macrocrystal-monohydrate, (MACROBID) 100 MG capsule Take 1 capsule (100 mg total) by mouth 2 (two) times daily.  . traMADol (ULTRAM) 50 MG tablet Take 1 tablet (50 mg total) by mouth at bedtime as needed for moderate pain. (Patient not taking: Reported on 05/17/2018)  . [DISCONTINUED] amoxicillin-clavulanate (AUGMENTIN) 500-125 MG tablet Take 1 tablet (500 mg total) by mouth 2 (two) times daily. Take after eating   No facility-administered encounter medications on file as of 05/17/2018.     Allergy: No Known Allergies  Social Hx:  Tobacco use: none Alcohol use: none Illicit Drug use: none Illicit IV Drug use: none  Past Surgical Hx:  Past Surgical History:  Procedure Laterality Date  . BREAST BIOPSY Left 06/15/2012  . CYSTOSCOPY W/ RETROGRADES Bilateral 02/16/2018   Procedure: CYSTOSCOPY WITH  RETROGRADE PYELOGRAM BILATERAL DIAGNOSTIC URETEROSCOPY, BILATERAL  STENT PLACEMENT;  Surgeon: Ardis Hughs, MD;  Location: [facility];  Service: Urology;  Laterality: Bilateral;  . CYSTOSCOPY W/ URETERAL STENT PLACEMENT Bilateral 04/22/2018   Procedure: CYSTOSCOPY WITH BILATERAL RETROGRADE PYELOGRAM/ AND URETERAL STENT EXCHANGE, vaginal exam;  Surgeon: Ardis Hughs, MD;  Location: [facility];  Service: Urology;  Laterality: Bilateral;  . D & C LEEP CONIZATION BX  07-31-2003   dr p. rose [facility]  . IR NEPHROSTOGRAM RIGHT THRU EXISTING ACCESS  03/18/2018  . IR NEPHROSTOMY EXCHANGE LEFT  04/27/2018  . IR NEPHROSTOMY EXCHANGE RIGHT  04/27/2018  . IR NEPHROSTOMY PLACEMENT LEFT  03/18/2018  . IR NEPHROSTOMY PLACEMENT RIGHT  03/02/2018  . PELVIC LYMPH NODE DISSECTION Bilateral 02/08/2018   Procedure: BILATEAL PEVIC  LYMPHADENECTOMY;  Surgeon: [name], MD;  Location: [facility];  Service: Gynecology;  Laterality: Bilateral;  . ROBOTIC ASSISTED TOTAL HYSTERECTOMY WITH BILATERAL SALPINGO OOPHERECTOMY N/A 02/08/2018   Procedure: XI ROBOTIC ASSISTED TOTAL Radical HYSTERECTOMY WITH BILATERAL SALPINGO OOPHORECTOMY;  Surgeon: [name], MD;  Location[facility];  Service: Gynecology;  Laterality: N/A;  . Transvaginal tape placement  03-12-2009  dr Emeterio Reeve  [facility]   GYNECARE TENSION-FREE VAGINAL TAPE SLING  . TUBAL LIGATION  05-28-2005  DR  MARSHALL  @ [facility]   PPTL    Past Medical Hx:  Past Medical History:  Diagnosis Date  . Abnormal Pap smear   . Anemia   . Cervical cancer [facility]) oncologist- dr Denman George  Stage IB1  SCCa   . Diabetes mellitus without complication ([place])   . Dyspnea   . Fatigue   . Hyperlipidemia   . Left breast mass 2013   7x4x4 mm 6o'clock  . PONV (postoperative nausea and vomiting)   . Sepsis ([place]) 03/2018   Urosepsis, Resolved  . Thyroid nodule 03/25/2018   inferior right thyroid, noted on US thyroid  . Ureterovaginal fistula   . Urgency of urination    intermittant   . Urinary frequency     Past Gynecological History:   GYNECOLOGIC HISTORY:  Patient's last menstrual period was 11/16/2012. Menarche: 57 years old P 5 LMP 57 yo Contraceptive 5 years OCP HRT none  Last Pap see HPI  Family Hx:  Family History  Problem Relation Age of Onset  . Hypertension Mother   . Heart disease Mother   . Hypertension Brother   . Heart disease Brother     Review of Systems:  Review of Systems  Constitutional: Negative.   HENT:  Negative.   Eyes: Negative.   Respiratory: Negative.   Cardiovascular: Negative.   Endocrine: Negative.   Genitourinary: Negative.         Leakage of urine from vagina  Musculoskeletal: Negative.   Skin: Negative.   Neurological: Negative.   Hematological: Negative.   Psychiatric/Behavioral: Positive for depression.    Vitals:  Blood pressure 121/78, pulse 85, temperature 98.9 F (37.2 C), temperature source Oral, resp. rate 18, height 4\' 11"  (1.499 m), weight 141 lb 14.4 oz (64.4 kg), last menstrual period 11/16/2012, SpO2 100 %. Body mass index is 28.66 kg/m.   Physical Exam: ECOG PERFORMANCE STATUS: 1 - Symptomatic but completely ambulatory   General :  Well developed, 57 y.o., female in no apparent distress HEENT:  Normocephalic/atraumatic, symmetric, EOMI, eyelids normal Neck:   Supple, no masses.  Lymphatics:  No cervical/ submandibular/ supraclavicular/ infraclavicular/ inguinal adenopathy Respiratory:  Respirations unlabored, no use of accessory muscles CV:   Deferred Breast:  Deferred Musculoskeletal: No CVA tenderness, normal muscle strength. Abdomen:  Soft, non-tender and nondistended. No evidence of hernia. No masses. Well healed incisions.  Extremities:  No lymphedema, no erythema, non-tender. Skin:   Normal inspection Neuro/Psych:  No focal motor deficit, no abnormal mental status. Normal gait. Normal affect. Alert and oriented to person, place, and time  Genito Urinary: Speculum exam reveals no  blood. Stents no longer visible vaginally. There are puckered areas in the right and left vaginal fornices and a small amount (trace) urine in the vagina, but actual fistulae no longer visible.  No active bleeding.  Rectovaginal:  deferred    Thereasa Solo, MD  05/17/2018, 3:19 PM    Cc: Darron Doom, MD (Referring Ob/Gyn)

## 2018-05-19 ENCOUNTER — Telehealth: Payer: Self-pay

## 2018-05-19 DIAGNOSIS — N390 Urinary tract infection, site not specified: Secondary | ICD-10-CM

## 2018-05-19 LAB — URINE CULTURE: Culture: >=100000 — AB

## 2018-05-19 MED ORDER — CIPROFLOXACIN HCL 250 MG PO TABS: 250.0000 mg | ORAL_TABLET | Freq: Two times a day (BID) | ORAL | 0 refills | Status: DC

## 2018-05-19 NOTE — Telephone Encounter (Signed)
Outgoing call to patient per Joylene John NP - per her pt needs to stop Macrobid, Start Cipro 250 mg twice a day for 7 days # 14, no refill. If symptoms worsen ie fever, back pain, then go to ED.  Used spanish interpreter Almyra Free to call pt and let pt know above info.  Pt voiced understanding. No other needs per pt at this time.

## 2018-05-20 LAB — URINE CULTURE: Culture: >=100000 — AB

## 2018-05-26 ENCOUNTER — Encounter (HOSPITAL_BASED_OUTPATIENT_CLINIC_OR_DEPARTMENT_OTHER): Payer: Self-pay | Admitting: *Deleted

## 2018-05-26 ENCOUNTER — Other Ambulatory Visit: Payer: Self-pay

## 2018-05-26 NOTE — Progress Notes (Addendum)
Spoke with patient via telephone for pre op interview. Patient requested a spanish interpreter, [employer] used. Interpreter Dawson Bills [phone] assisted with pre op interview. No medications AM of surgery. NPO after MN. Needs ISTAT8 for gent. Language resources notified of need for interpreter on DOS. Interpreter to be there at 1000. Copy of email sent is on patient chart. EKG and CBC on chart and in Epic. Arrival time 1000.

## 2018-05-28 ENCOUNTER — Ambulatory Visit (HOSPITAL_BASED_OUTPATIENT_CLINIC_OR_DEPARTMENT_OTHER): Payer: Medicaid Other | Admitting: Certified Registered Nurse Anesthetist

## 2018-05-28 ENCOUNTER — Ambulatory Visit (HOSPITAL_BASED_OUTPATIENT_CLINIC_OR_DEPARTMENT_OTHER)
Admission: RE | Admit: 2018-05-28 | Discharge: 2018-05-28 | Disposition: A | Discharge status: Home / Self Care | Payer: Medicaid Other | Source: Ambulatory Visit | Attending: Urology | Admitting: Urology

## 2018-05-28 ENCOUNTER — Encounter (HOSPITAL_BASED_OUTPATIENT_CLINIC_OR_DEPARTMENT_OTHER): Payer: Self-pay

## 2018-05-28 ENCOUNTER — Encounter (HOSPITAL_BASED_OUTPATIENT_CLINIC_OR_DEPARTMENT_OTHER)
Admission: RE | Disposition: A | Discharge status: Home / Self Care | Payer: Self-pay | Source: Ambulatory Visit | Attending: Urology

## 2018-05-28 DIAGNOSIS — Z7984 Long term (current) use of oral hypoglycemic drugs: Secondary | ICD-10-CM | POA: Insufficient documentation

## 2018-05-28 DIAGNOSIS — N821 Other female urinary-genital tract fistulae: Secondary | ICD-10-CM | POA: Diagnosis not present

## 2018-05-28 DIAGNOSIS — Z436 Encounter for attention to other artificial openings of urinary tract: Secondary | ICD-10-CM | POA: Diagnosis not present

## 2018-05-28 DIAGNOSIS — Z79899 Other long term (current) drug therapy: Secondary | ICD-10-CM | POA: Diagnosis not present

## 2018-05-28 DIAGNOSIS — Z8541 Personal history of malignant neoplasm of cervix uteri: Secondary | ICD-10-CM | POA: Insufficient documentation

## 2018-05-28 DIAGNOSIS — Z90710 Acquired absence of both cervix and uterus: Secondary | ICD-10-CM | POA: Diagnosis not present

## 2018-05-28 DIAGNOSIS — E119 Type 2 diabetes mellitus without complications: Secondary | ICD-10-CM | POA: Insufficient documentation

## 2018-05-28 HISTORY — PX: CYSTOSCOPY W/ URETERAL STENT PLACEMENT: SHX1429

## 2018-05-28 LAB — POCT I-STAT, CHEM 8
BUN: 9 mg/dL (ref 6–20)
CALCIUM ION: 1.19 mmol/L (ref 1.15–1.40)
Chloride: 102 mmol/L (ref 98–111)
Creatinine, Ser: 0.6 mg/dL (ref 0.44–1.00)
GLUCOSE: 101 mg/dL — AB (ref 70–99)
HCT: 36 % (ref 36.0–46.0)
Hemoglobin: 12.2 g/dL (ref 12.0–15.0)
Potassium: 3.5 mmol/L (ref 3.5–5.1)
SODIUM: 141 mmol/L (ref 135–145)
TCO2: 27 mmol/L (ref 22–32)

## 2018-05-28 LAB — GLUCOSE, CAPILLARY: Glucose-Capillary: 114 mg/dL — ABNORMAL HIGH (ref 70–99)

## 2018-05-28 SURGERY — CYSTOSCOPY, WITH RETROGRADE PYELOGRAM AND URETERAL STENT INSERTION
Anesthesia: General | Site: Renal | Laterality: Bilateral

## 2018-05-28 MED ORDER — SCOPOLAMINE 1 MG/3DAYS TD PT72
MEDICATED_PATCH | TRANSDERMAL | Status: AC
  Filled 2018-05-28: qty 1

## 2018-05-28 MED ORDER — ONDANSETRON HCL 4 MG/2ML IJ SOLN
INTRAMUSCULAR | Status: DC | PRN
  Administered 2018-05-28: 4 mg via INTRAVENOUS

## 2018-05-28 MED ORDER — FENTANYL CITRATE (PF) 100 MCG/2ML IJ SOLN
25.0000 ug | INTRAMUSCULAR | Status: DC | PRN
  Administered 2018-05-28: 25 ug via INTRAVENOUS
  Filled 2018-05-28: qty 1

## 2018-05-28 MED ORDER — TRAMADOL HCL 50 MG PO TABS: 50.0000 mg | ORAL_TABLET | Freq: Four times a day (QID) | ORAL | 0 refills | Status: DC | PRN

## 2018-05-28 MED ORDER — LACTATED RINGERS IV SOLN
INTRAVENOUS | Status: DC
  Filled 2018-05-28: qty 1000

## 2018-05-28 MED ORDER — IOHEXOL 300 MG/ML  SOLN
INTRAMUSCULAR | Status: DC | PRN
  Administered 2018-05-28: 30 mL

## 2018-05-28 MED ORDER — PHENAZOPYRIDINE HCL 200 MG PO TABS: 200.0000 mg | ORAL_TABLET | Freq: Three times a day (TID) | ORAL | 0 refills | Status: DC | PRN

## 2018-05-28 MED ORDER — PROPOFOL 10 MG/ML IV BOLUS
INTRAVENOUS | Status: DC | PRN
  Administered 2018-05-28: 200 mg via INTRAVENOUS

## 2018-05-28 MED ORDER — MIDAZOLAM HCL 2 MG/2ML IJ SOLN
INTRAMUSCULAR | Status: AC
  Filled 2018-05-28: qty 2

## 2018-05-28 MED ORDER — LIDOCAINE 2% (20 MG/ML) 5 ML SYRINGE
INTRAMUSCULAR | Status: AC
  Filled 2018-05-28: qty 5

## 2018-05-28 MED ORDER — EPHEDRINE 5 MG/ML INJ
INTRAVENOUS | Status: AC
  Filled 2018-05-28: qty 10

## 2018-05-28 MED ORDER — DEXAMETHASONE SODIUM PHOSPHATE 10 MG/ML IJ SOLN
INTRAMUSCULAR | Status: AC
  Filled 2018-05-28: qty 1

## 2018-05-28 MED ORDER — FENTANYL CITRATE (PF) 100 MCG/2ML IJ SOLN
INTRAMUSCULAR | Status: AC
  Filled 2018-05-28: qty 2

## 2018-05-28 MED ORDER — GENTAMICIN SULFATE 40 MG/ML IJ SOLN
5.0000 mg/kg | INTRAVENOUS | Status: DC
  Filled 2018-05-28: qty 8

## 2018-05-28 MED ORDER — EPHEDRINE SULFATE 50 MG/ML IJ SOLN
INTRAMUSCULAR | Status: DC | PRN
  Administered 2018-05-28 (×5): 10 mg via INTRAVENOUS

## 2018-05-28 MED ORDER — PROPOFOL 10 MG/ML IV BOLUS
INTRAVENOUS | Status: AC
  Filled 2018-05-28: qty 20

## 2018-05-28 MED ORDER — METOCLOPRAMIDE HCL 5 MG/ML IJ SOLN
10.0000 mg | Freq: Once | INTRAMUSCULAR | Status: DC | PRN
  Filled 2018-05-28: qty 2

## 2018-05-28 MED ORDER — LIDOCAINE HCL (CARDIAC) PF 100 MG/5ML IV SOSY
PREFILLED_SYRINGE | INTRAVENOUS | Status: DC | PRN
  Administered 2018-05-28: 60 mg via INTRAVENOUS

## 2018-05-28 MED ORDER — FENTANYL CITRATE (PF) 100 MCG/2ML IJ SOLN
INTRAMUSCULAR | Status: DC | PRN
  Administered 2018-05-28 (×2): 50 ug via INTRAVENOUS

## 2018-05-28 MED ORDER — DEXAMETHASONE SODIUM PHOSPHATE 4 MG/ML IJ SOLN
INTRAMUSCULAR | Status: DC | PRN
  Administered 2018-05-28: 5 mg via INTRAVENOUS

## 2018-05-28 MED ORDER — SCOPOLAMINE 1 MG/3DAYS TD PT72
1.0000 | MEDICATED_PATCH | TRANSDERMAL | Status: DC
  Administered 2018-05-28: 1.5 mg via TRANSDERMAL
  Filled 2018-05-28: qty 1

## 2018-05-28 MED ORDER — SODIUM CHLORIDE 0.9 % IR SOLN
Status: DC | PRN
  Administered 2018-05-28: 3000 mL

## 2018-05-28 MED ORDER — ONDANSETRON HCL 4 MG/2ML IJ SOLN
INTRAMUSCULAR | Status: AC
  Filled 2018-05-28: qty 2

## 2018-05-28 MED ORDER — GENTAMICIN SULFATE 40 MG/ML IJ SOLN
260.0000 mg | INTRAVENOUS | Status: AC
  Administered 2018-05-28: 260 mg via INTRAVENOUS
  Filled 2018-05-28 (×3): qty 6.5

## 2018-05-28 MED ORDER — LACTATED RINGERS IV SOLN
INTRAVENOUS | Status: DC
  Administered 2018-05-28 (×2): via INTRAVENOUS
  Filled 2018-05-28: qty 1000

## 2018-05-28 MED ORDER — MIDAZOLAM HCL 5 MG/5ML IJ SOLN
INTRAMUSCULAR | Status: DC | PRN
  Administered 2018-05-28: 2 mg via INTRAVENOUS

## 2018-05-28 MED ORDER — MEPERIDINE HCL 25 MG/ML IJ SOLN
6.2500 mg | INTRAMUSCULAR | Status: DC | PRN
  Filled 2018-05-28: qty 1

## 2018-05-28 SURGICAL SUPPLY — 25 items
BAG DRAIN URO-CYSTO SKYTR STRL (DRAIN) ×3 IMPLANT
BASKET LASER NITINOL 1.9FR (BASKET) IMPLANT
CATH URET 5FR 28IN OPEN ENDED (CATHETERS) ×3 IMPLANT
CATH URET DUAL LUMEN 6-10FR 50 (CATHETERS) IMPLANT
CLOTH BEACON ORANGE TIMEOUT ST (SAFETY) ×3 IMPLANT
EXTRACTOR STONE 1.7FRX115CM (UROLOGICAL SUPPLIES) IMPLANT
FIBER LASER TRAC TIP (UROLOGICAL SUPPLIES) IMPLANT
GLOVE BIO SURGEON STRL SZ7.5 (GLOVE) ×3 IMPLANT
GLOVE BIOGEL PI IND STRL 8.5 (GLOVE) ×1 IMPLANT
GLOVE BIOGEL PI INDICATOR 8.5 (GLOVE) ×2
GLOVE INDICATOR 8.5 STRL (GLOVE) ×3 IMPLANT
GOWN STRL REUS W/TWL XL LVL3 (GOWN DISPOSABLE) ×3 IMPLANT
GUIDEWIRE ANG ZIPWIRE 038X150 (WIRE) IMPLANT
GUIDEWIRE STR DUAL SENSOR (WIRE) ×3 IMPLANT
INFUSOR MANOMETER BAG 3000ML (MISCELLANEOUS) IMPLANT
IV NS IRRIG 3000ML ARTHROMATIC (IV SOLUTION) ×6 IMPLANT
KIT TURNOVER CYSTO (KITS) ×3 IMPLANT
MANIFOLD NEPTUNE II (INSTRUMENTS) IMPLANT
NS IRRIG 500ML POUR BTL (IV SOLUTION) ×3 IMPLANT
PACK CYSTO (CUSTOM PROCEDURE TRAY) ×3 IMPLANT
STENT POLARIS 5FRX22 (STENTS) ×3 IMPLANT
STENT URET 6FRX24 CONTOUR (STENTS) ×3 IMPLANT
TUBE CONNECTING 12'X1/4 (SUCTIONS)
TUBE CONNECTING 12X1/4 (SUCTIONS) IMPLANT
TUBING UROLOGY SET (TUBING) ×3 IMPLANT

## 2018-05-28 NOTE — Op Note (Signed)
Preoperative diagnosis:  1. Bilateral ureteral vaginal fistulas  Postoperative diagnosis:  1. Same  Procedure: 1. Bilateral retrograde pyelogram with interpretation 2. Bilateral diagnostic ureteroscopy 3. Bilateral ureteral stent exchange 4. Bilateral nephrostomy tube removal  Surgeon: Ardis Hughs, MD  Anesthesia: General  Complications: None  Intraoperative findings:  #1: 5 French open-ended ureteral catheter was used to perform a retrograde pyelogram in the patient's left ureter which demonstrated normal caliber ureter with no hydro-.  However, there was a narrowed segment at the intramural ureter and just at the UVJ with a prominent fistula draining into the vagina. #2: I removed the wire to see the how well the ureter with drain, it did not drain well at all.  As such I tried to repassed the wire and ultimately had to pass a ureteroscope through the distal ureter in order to get the wire up into the left renal pelvis.  This noted distorted and abnormal mucosa in the intravesical and ureterovesical junction. #3: The patient's right sided ureteral stent distal had migrated into the patient's vagina.  In order to remove the stent I had to perform ureteroscopy on the right ureter cannulating the right ureteral orifice and advancing it through the intramural ureter and beyond the distal ureter.  Once I was up beyond the fistula I passed the wire through the scope and into the right renal pelvis.  Once the wire was in the pelvis I removed the scope and pulled the stent through the patient's vagina. 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 #5: A 24 cm x 6 French double-J ureteral stent was placed in the right ureter. #6: A 22 cm x 5 French Polaris catheter was placed in the patient's left ureter.  EBL:  Minimal  Specimens: None  Indication: Amy Morrow is a 57 y.o. 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  The patient had had nephrostomy tubes as well as ureteral stents placed previously, we then exchanged her stents and were hopeful that after a month we will could restudy this and the fistulas will have healed..  After reviewing the management options for treatment, he elected to proceed with the above surgical procedure(s). We have discussed the potential benefits and risks of the procedure, side effects of the proposed treatment, the likelihood of the patient achieving the goals of the procedure, and any potential problems that might occur during the procedure or recuperation. Informed consent has been obtained.  Description of procedure:  The patient was taken to the operating room and general anesthesia was induced.  The patient was placed in the dorsal lithotomy position, prepped and draped in the usual sterile fashion, and preoperative antibiotics were administered. A preoperative time-out was performed.   21 French 30 degrees cystoscope was gently passed through the patient's urethra and into the bladder under visual guidance.  Immediately, I noted that there was no stent in the patient's right ureteral orifice.  Fluoroscopy demonstrated a stent in the right ureter, then evaluated the patient's vagina and noted that the stent was emanating from the apex of her vagina.  I then grasped the stent emanating from the patient's left ureteral orifice and pulled up to the urethral meatus.  A 0.038 sensor wire was then advanced up through the one stent and into the left collecting  system.  The stent was removed with a wire.  I then exchanged the wire for an open-ended 5 French ureteral catheter performed retrograde pyelogram with the above findings.  I then removed the 5 [place] open-ended catheter to  evaluate the drainage of the system.  I then noted a prominent fistula to the patient's vagina from the distal ureter.  I then advanced a 6 French semirigid ureteroscope through the patient's right ureteral orifice and into the intramural ureter.  Here I was ultimately able to navigate past the fistula and encountered the patient's stent in the distal ureter.  I advanced a wire through the scope and into the right collecting system.  Once the stent was noted to be well within the right renal pelvis I backed the scope out over the wire and then pulled the patient's stent through the fascia on out the vagina.  I then advanced a 5 [place] open-ended catheter over the wire and into the right collecting system performing retrograde pyelogram with the above findings.  I then exchange the open-ended catheter again for the stent and advanced a 6 [place] x24 cm double-J ureteral stent over the wire under fluoroscopy placing it nicely into the right renal pelvis.  Once the stent was noted to be well positioned I advanced into the bladder neck before removing the wire.  I then tried to pass a wire through the patient's left ureteral orifice unsuccessfully.  I then advanced a 6 French semirigid ureteroscope through the left ureteral orifice and ultimately was able to find the native ureter proximally.  Once I was beyond the fistula advanced a wire through the scope and then backed out the scope over the wire.  I then advanced a 22 cm x 5 French Polaris ureteral stent over the wire noting a nice curl in the lower pole of the left kidney.  When the wire was removed the distal aspect of the stent was well-positioned emanating from the left ureteral orifice.  The bladder was then drained.  I then removed both nephrostomy tubes under fluoroscopic guidance by advancing a wire through the tube and removing the nephrostomy tube over the wire and then remove the wire.  The nephrostomy tube insertion sites were then dressed with  gauze and Tegaderm.  The patient was subsequently extubated return the PACU in stable condition.  Ardis Hughs, M.D.

## 2018-05-28 NOTE — Discharge Instructions (Signed)
DISCHARGE INSTRUCTIONS FOR KIDNEY STONE/URETERAL STENT   MEDICATIONS:  1.  Resume all your other meds from home - except do not take any extra narcotic pain meds that you may have at home.  2. Pyridium is to help with the burning/stinging when you urinate. 3. Tramadol is for moderate/severe pain, otherwise taking upto 1000 mg every 6 hours of plainTylenol will help treat your pain.     ACTIVITY:  1. No strenuous activity x 1week  2. No driving while on narcotic pain medications  3. Drink plenty of water  4. Continue to walk at home - you can still get blood clots when you are at home, so keep active, but don't over do it.  5. May return to work/school tomorrow or when you feel ready   BATHING:  1. You can shower and we recommend daily showers    SIGNS/SYMPTOMS TO CALL:  Please call us if you have a fever greater than 101.5, uncontrolled nausea/vomiting, uncontrolled pain, dizziness, unable to urinate, bloody urine, chest pain, shortness of breath, leg swelling, leg pain, redness around wound, drainage from wound, or any other concerns or questions.   You can reach [place] at [phone].   FOLLOW-UP:  1. You have an appointment in 2 weeks to discuss further surgical options.   Post Anesthesia Home Care Instructions  Activity: Get plenty of rest for the remainder of the day. A responsible adult should stay with you for 24 hours following the procedure.  For the next 24 hours, DO NOT: -Drive a car -Paediatric nurse -Drink alcoholic beverages -Take any medication unless instructed by your physician -Make any legal decisions or sign important papers.  Meals: Start with liquid foods such as gelatin or soup. Progress to regular foods as tolerated. Avoid greasy, spicy, heavy foods. If nausea and/or vomiting occur, drink only clear liquids until the nausea and/or vomiting subsides. Call your physician if vomiting continues.  Special Instructions/Symptoms: Your throat may feel dry or  sore from the anesthesia or the breathing tube placed in your throat during surgery. If this causes discomfort, gargle with warm salt water. The discomfort should disappear within 24 hours.  If you had a scopolamine patch placed behind your ear for the management of post- operative nausea and/or vomiting:  1. The medication in the patch is effective for 72 hours, after which it should be removed.  Wrap patch in a tissue and discard in the trash. Wash hands thoroughly with soap and water. 2. You may remove the patch earlier than 72 hours if you experience unpleasant side effects which may include dry mouth, dizziness or visual disturbances. 3. Avoid touching the patch. Wash your hands with soap and water after contact with the patch.

## 2018-05-28 NOTE — H&P (Signed)
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   The patient underwent a radical hysterectomy and pelvic lymphadenectomy around 02/09/18 for cervical cancer. Her postoperative course was largely unremarkable, however she then developed dehiscence of her vaginal cuff and significant drainage. She was ultimately noted to have bilateral ureteral injuries. She was taken to the operating room on 02/16/18 for bilateral stent placement. At the time, the distal aspect of both ureters was noted to be ischemic. Following stent placement she continued to have severe leakage. Ultimately, 5 or 6 days ago she had bilateral nephrostomy tubes placed. Since that time she notes that she has had significant improvement in her leakage. She was mostly dry, but did have some leakage this morning. Otherwise, she is starting to dry out, things are improving for her. Her Foley catheter has been removed.   The patient the moment is not complaining of any dysuria, flank pain, or fever/chills.   9/19: Underwent bilateral ureteroscopy, retrograde pyelograms and stent exchange. Stent tether from right stent protruding through vaginal cuff. ureteroscopy demonstrated no significant abnormality on the right, right retrograde was normal. Left retrograde demonstrated small abnormality in the intramural ureter, confirmed by the retrograde demonstrating a small abnormality.   Intv: Seen today pre-op for repeat URS bilateral to reassess the ureteral injuries.     CC: Follow-up for recent surgery  HPI: Amy Morrow is a 57 year-old female established patient who is here for a post-op visit following recent surgery.  She has not had post operative fever. The patient is still having incisional pain. She no longer require narcotic pain medication for their pain. does have a good appetite. Her bowels are moving normally.     ALLERGIES: No Allergies    MEDICATIONS: Metformin Hcl  Nitrofurantoin      GU PSH: Cystoscopy Insert Stent, Bilateral - 04/22/2018, Bilateral - 02/16/2018 Cystoscopy Ureteroscopy, Left - 04/22/2018, Right - 02/16/2018      PSH Notes: Preventive medication therapy needed, Bladder Surgery   NON-GU PSH: No Non-GU PSH    GU PMH: Fistula Female genital tract, Unspec - 03/22/2018 Other microscopic hematuria, Microscopic hematuria - 2014 Urge incontinence, Urge incontinence of urine - 2014 Urinary Frequency, Increased urinary frequency - 2014 Urinary Urgency, Urinary urgency - 2014      PMH Notes:  1898-08-04 00:00:00 - Note: Normal Routine History And Physical Adult      pre-diabetic 2019   NON-GU PMH: Anxiety, Anxiety (Symptom) - 2014 Personal history of other diseases of the nervous system and sense organs, History of sleep apnea - 2014    FAMILY HISTORY: Family Health Status - Father alive at age 54 - 60 In Family Family Health Status - Mother's Age - Mother Family Health Status Number - Runs In Family nephrolithiasis - Mother   SOCIAL HISTORY: Marital Status: Married Preferred Language: Spanish; Education officer, community; Ethnicity: Not Hispanic Or Latino; Race: Other Race Current Smoking Status: Patient has never smoked.   Tobacco Use Assessment Completed: Used Tobacco in last 30 days? Does not drink anymore.  Drinks 1 caffeinated drink per day.     Notes: Tobacco Use, Alcohol Use, Caffeine Use, Occupation: Agricultural engineer, Marital History - Currently Married   REVIEW OF SYSTEMS:    GU Review Female:   Patient denies frequent urination, hard to postpone urination, burning /pain with urination, get up at night to urinate, leakage of urine, stream starts and stops, trouble starting your stream, have to strain to urinate, and being pregnant.  Gastrointestinal (Upper):  Patient denies nausea, vomiting, and indigestion/ heartburn.  Gastrointestinal (Lower):   Patient denies diarrhea and constipation.  Constitutional:   Patient denies fever, night sweats, weight loss,  and fatigue.  Skin:   Patient denies skin rash/ lesion and itching.  Eyes:   Patient denies blurred vision and double vision.  Ears/ Nose/ Throat:   Patient denies sore throat and sinus problems.  Hematologic/Lymphatic:   Patient denies swollen glands and easy bruising.  Cardiovascular:   Patient denies leg swelling and chest pains.  Respiratory:   Patient denies cough and shortness of breath.  Endocrine:   Patient denies excessive thirst.  Musculoskeletal:   Patient denies back pain and joint pain.  Neurological:   Patient denies headaches and dizziness.  Psychologic:   Patient denies depression and anxiety.   VITAL SIGNS:      05/06/2018 01:39 PM  Weight 142 lb / 64.41 kg  Height 59 in / 149.86 cm  BP 111/72 mmHg  Pulse 84 /min  Temperature 98.4 F / 36.8 C  BMI 28.7 kg/m   PAST DATA REVIEWED:  Source Of History:  Patient   PROCEDURES:          Urinalysis w/Scope Dipstick Dipstick Cont'd Micro  Color: Yellow Bilirubin: Neg mg/dL WBC/hpf: 20 - 40/hpf  Appearance: Cloudy Ketones: Neg mg/dL RBC/hpf: 10 - 20/hpf  Specific Gravity: 1.015 Blood: 3+ ery/uL Bacteria: Many (>50/hpf)  pH: 6.5 Protein: 2+ mg/dL Cystals: NS (Not Seen)  Glucose: Neg mg/dL Urobilinogen: 0.2 mg/dL Casts: NS (Not Seen)    Nitrites: Neg Trichomonas: Not Present    Leukocyte Esterase: 2+ leu/uL Mucous: Not Present      Epithelial Cells: 0 - 5/hpf      Yeast: NS (Not Seen)      Sperm: Not Present    ASSESSMENT:      ICD-10 Details  1 GU:   Fistula Female genital tract, Unspec - N82.9      PLAN:           Orders Labs Urine Culture          Schedule Return Visit/Planned Activity: Keep Scheduled Appointment - Office Visit          Document Letter(s):  Created for Patient: Clinical Summary         Notes:   The patient is doing well, has had no ongoing leakage from her vagina. This seems like a good sign that things are continuing to heal. She had her nephrostomy tubes changed 2 weeks ago. She  was actually started on Augmentin from her primary care provider because of bacteria in her urine backs, not sure she had symptoms of its but she was complaining of some right-sided pain after the extremity tubes. We'll clip a specimen today to ensure that her infection is cleared, she will continue taking antibiotics as prescribed.   Our plan is to have her return on the 18th to obtain a urine specimen for culture preop. She has a date for diagnostic ureteroscopy and bilateral retrogrades to ensure that everything is healed up on October 25.   If everything looks good at her next procedure I will plan to take out her nephrostomy tubes and leave her stents. We will then perform stent removal in the office 2 weeks after that.

## 2018-05-28 NOTE — Anesthesia Preprocedure Evaluation (Signed)
Anesthesia Evaluation  Patient identified by MRN, date of birth, ID band Patient awake    Reviewed: Allergy & Precautions, NPO status , Patient's Chart, lab work & pertinent test results  History of Anesthesia Complications (+) PONV and history of anesthetic complications  Airway Mallampati: I  TM Distance: >3 FB Neck ROM: Full    Dental no notable dental hx. (+) Teeth Intact, Dental Advisory Given   Pulmonary shortness of breath and with exertion,    Pulmonary exam normal breath sounds clear to auscultation       Cardiovascular negative cardio ROS Normal cardiovascular exam Rhythm:Regular Rate:Normal     Neuro/Psych negative neurological ROS  negative psych ROS   GI/Hepatic negative GI ROS, Neg liver ROS,   Endo/Other  diabetes, Well Controlled, Type 2  Renal/GU negative Renal ROS   Ureterovaginal fistula    Musculoskeletal negative musculoskeletal ROS (+)   Abdominal   Peds  Hematology  (+) anemia ,   Anesthesia Other Findings   Reproductive/Obstetrics                             Anesthesia Physical  Anesthesia Plan  ASA: II  Anesthesia Plan: General   Post-op Pain Management:    Induction: Intravenous  PONV Risk Score and Plan: 4 or greater and Scopolamine patch - Pre-op, Midazolam, Ondansetron, Dexamethasone and Treatment may vary due to age or medical condition  Airway Management Planned: LMA  Additional Equipment:   Intra-op Plan:   Post-operative Plan: Extubation in OR  Informed Consent: I have reviewed the patients History and Physical, chart, labs and discussed the procedure including the risks, benefits and alternatives for the proposed anesthesia with the patient or authorized representative who has indicated his/her understanding and acceptance.   Dental advisory given  Plan Discussed with: CRNA and Surgeon  Anesthesia Plan Comments:          Anesthesia Quick Evaluation

## 2018-05-28 NOTE — Anesthesia Procedure Notes (Signed)
Procedure Name: LMA Insertion Date/Time: 05/28/2018 12:20 PM Performed by: Bufford Spikes, CRNA Pre-anesthesia Checklist: Patient identified, Emergency Drugs available, Suction available and Patient being monitored Patient Re-evaluated:Patient Re-evaluated prior to induction Oxygen Delivery Method: Circle system utilized Preoxygenation: Pre-oxygenation with 100% oxygen Induction Type: IV induction Ventilation: Mask ventilation without difficulty LMA: LMA inserted LMA Size: 4.0 Number of attempts: 1 Placement Confirmation: positive ETCO2 Tube secured with: Tape Dental Injury: Teeth and Oropharynx as per pre-operative assessment

## 2018-05-28 NOTE — Transfer of Care (Addendum)
Immediate Anesthesia Transfer of Care Note  Patient: Amy Morrow  Procedure(s) Performed: CYSTOSCOPY WITH BILATERAL  RETROGRADE PYELOGRAM/URETERAL STENT REPLACEMENT, REMOVAL OF BILATERAL PERCUTANEOUS DRAINS (Bilateral Renal)  Patient Location: PACU  Anesthesia Type:General  Level of Consciousness: sedated and responds to stimulation  Airway & Oxygen Therapy: Patient Spontanous Breathing and Patient connected to face mask oxygen  Post-op Assessment: Report given to RN by Verner Mould CRNA  Post vital signs: Reviewed and stable  Last Vitals: 129/82, 93, 14, 100% Vitals Value Taken Time  BP    Temp    Pulse    Resp    SpO2      Last Pain:  Vitals:   05/28/18 1553  TempSrc:   PainSc: 0-No pain      Patients Stated Pain Goal: 8 (70/48/88 [id number])  Complications: No apparent anesthesia complications

## 2018-05-31 ENCOUNTER — Encounter (HOSPITAL_BASED_OUTPATIENT_CLINIC_OR_DEPARTMENT_OTHER): Payer: Self-pay | Admitting: Urology

## 2018-05-31 NOTE — Anesthesia Postprocedure Evaluation (Signed)
Anesthesia Post Note  Patient: Amy Morrow  Procedure(s) Performed: CYSTOSCOPY WITH BILATERAL  RETROGRADE PYELOGRAM/URETERAL STENT REPLACEMENT, REMOVAL OF BILATERAL PERCUTANEOUS DRAINS (Bilateral Renal)     Patient location during evaluation: PACU Anesthesia Type: General Level of consciousness: awake and alert Pain management: pain level controlled Vital Signs Assessment: post-procedure vital signs reviewed and stable Respiratory status: spontaneous breathing, nonlabored ventilation, respiratory function stable and patient connected to nasal cannula oxygen Cardiovascular status: blood pressure returned to baseline and stable Postop Assessment: no apparent nausea or vomiting Anesthetic complications: no    Last Vitals:  Vitals:   05/28/18 1400 05/28/18 1553  BP: (!) 142/53 134/68  Pulse: 82 99  Resp: 18 18  Temp:  36.8 C  SpO2: 99% 100%    Last Pain:  Vitals:   05/31/18 1358  TempSrc:   PainSc: 3                  Montez Hageman

## 2018-06-01 ENCOUNTER — Ambulatory Visit: Payer: Medicaid Other | Attending: Family Medicine | Admitting: Family Medicine

## 2018-06-01 ENCOUNTER — Other Ambulatory Visit: Payer: Self-pay

## 2018-06-01 ENCOUNTER — Encounter: Payer: Self-pay | Admitting: Family Medicine

## 2018-06-01 VITALS — BP 117/80 | HR 93 | Temp 99.0°F | Resp 18 | Ht 59.0 in | Wt 143.8 lb

## 2018-06-01 DIAGNOSIS — Z8541 Personal history of malignant neoplasm of cervix uteri: Secondary | ICD-10-CM | POA: Diagnosis not present

## 2018-06-01 DIAGNOSIS — Z8249 Family history of ischemic heart disease and other diseases of the circulatory system: Secondary | ICD-10-CM | POA: Diagnosis not present

## 2018-06-01 DIAGNOSIS — K5903 Drug induced constipation: Secondary | ICD-10-CM | POA: Diagnosis not present

## 2018-06-01 DIAGNOSIS — Z9889 Other specified postprocedural states: Secondary | ICD-10-CM | POA: Diagnosis not present

## 2018-06-01 DIAGNOSIS — Z90710 Acquired absence of both cervix and uterus: Secondary | ICD-10-CM | POA: Insufficient documentation

## 2018-06-01 DIAGNOSIS — N390 Urinary tract infection, site not specified: Secondary | ICD-10-CM | POA: Diagnosis not present

## 2018-06-01 DIAGNOSIS — R7303 Prediabetes: Secondary | ICD-10-CM | POA: Diagnosis present

## 2018-06-01 DIAGNOSIS — R1030 Lower abdominal pain, unspecified: Secondary | ICD-10-CM | POA: Diagnosis not present

## 2018-06-01 DIAGNOSIS — D509 Iron deficiency anemia, unspecified: Secondary | ICD-10-CM

## 2018-06-01 DIAGNOSIS — Z9851 Tubal ligation status: Secondary | ICD-10-CM | POA: Insufficient documentation

## 2018-06-01 DIAGNOSIS — E785 Hyperlipidemia, unspecified: Secondary | ICD-10-CM | POA: Diagnosis not present

## 2018-06-01 DIAGNOSIS — N821 Other female urinary-genital tract fistulae: Secondary | ICD-10-CM

## 2018-06-01 DIAGNOSIS — T402X5A Adverse effect of other opioids, initial encounter: Secondary | ICD-10-CM

## 2018-06-01 DIAGNOSIS — R1084 Generalized abdominal pain: Secondary | ICD-10-CM

## 2018-06-01 LAB — GLUCOSE, POCT (MANUAL RESULT ENTRY): POC Glucose: 113 mg/dL — AB (ref 70–99)

## 2018-06-01 NOTE — Patient Instructions (Signed)
Estreimiento en los adultos Constipation, Adult Se llama estreimiento cuando:  Tiene deposiciones (defeca) una menor cantidad de veces a la semana de lo normal.  Tiene dificultad para defecar.  Las heces son secas y duras o son ms grandes que lo normal.  Siga estas indicaciones en su casa: Comida y bebida   Consuma alimentos con alto contenido de [place], por ejemplo: ? Lambert Mody y verduras frescas. ? Cereales integrales. ? Frijoles.  Consuma una menor cantidad de alimentos ricos en grasas, con bajo contenido de [place] o excesivamente procesados, como: ? Papas fritas. ? Hamburguesas. ? Galletas. ? Caramelos. ? Gaseosas.  Beba suficiente lquido para mantener el pis (orina) claro o de color amarillo plido. Instrucciones generales  Haga actividad fsica con regularidad o segn las indicaciones del mdico.  Vaya al bao cuando sienta la necesidad de defecar. No se aguante las ganas.  Tome los medicamentos de venta libre y los recetados solamente como se lo haya indicado el mdico. Estos incluyen los suplementos de [place].  Realice ejercicios de reentrenamiento del suelo plvico, como: ? Respirar profundamente mientras relaja la parte inferior del vientre (abdomen). ? Relajar el suelo plvico mientras defeca.  Controle su afeccin para ver si hay cambios.  Concurra a todas las visitas de control como se lo haya indicado el mdico. Esto es importante. Comunquese con un mdico si:  Siente un dolor que empeora.  Tiene fiebre.  No ha defecado por 4das.  Vomita.  No tiene hambre.  Pierde peso.  Tiene una hemorragia en el ano.  Las deposiciones Media planner) son delgadas como un lpiz. Solicite ayuda de inmediato si:  Jaclynn Guarneri, y los sntomas empeoran de repente.  Tiene prdida de materia fecal u observa [employer].  Siente el vientre ms duro o ms grande de lo normal (est hinchado).  Siente un dolor muy intenso en el vientre.  Se siente mareado o  se desmaya. Esta informacin no tiene Marine scientist el consejo del mdico. Asegrese de hacerle al mdico cualquier pregunta que tenga. Document Released: 08/23/2010 Document Revised: 10/22/2016 Document Reviewed: 01/09/2016 Elsevier Interactive Patient Education  2018 [place] constipacin despus de la ciruga (Preventing Constipation After Surgery) Constipacin significa que una persona tiene menos de 3 evacuaciones en una semana, hay dificultad para evacuar el intestino, o las heces son secas, duras, o ms grandes que lo normal. Hay muchos factores que pueden causar constipacin despus de la ciruga. Por ejemplo:  Los medicamentos, especialmente los que se utilizan para Field seismologist dormir (anestsicos) y los medicamentos para el dolor muy fuertes denominados narcticos.  El estrs causado por la [name].  Consumir alimentos diferentes a los que consume habitualmente.  La falta de [place]. Los sntomas de la constipacin son:  Management consultant el intestino menos de 3 veces a la semana.  Dificultad para mover el intestino.  Tener las heces secas y duras, o ms grandes que lo normal.  Sensacin de estar lleno o distendido.  Dolor en la parte baja del abdomen.  No sentir alivio despus de evacuar el intestino. INSTRUCCIONES PARA EL CUIDADO EN EL HOGAR Dieta  Consuma alimentos que contengan gran cantidad de [place]. Incluya frutas, vegetales, granos enteros y legumbres. Limite los alimentos ricos en grasas y azcar refinado. Por ejemplo patatas fritas, hamburguesas, galletas y caramelos.  Tome suplementos de fibras como se le indic. Si no toma un suplemento de [place] que no obtiene suficiente fibra de los alimentos, hable con su mdico par agregar un suplemento  de Fredderick Phenix a su dieta.  Beba lquidos claros, especialmente agua. Evite el consumo de alcohol, cafena y bebidas gaseosas. Estos pueden empeorar la constipacin.  Debe ingerir gran cantidad de lquido  para mantener la orina de tono claro o color amarillo plido. Actividad  Despus de la ciruga, vuelva lentamente a sus actividades normales o cuando su mdico le diga que ya est en condiciones.  Comience a caminar tan pronto como pueda. Trate de andar un poco ms [place].  Una vez que su mdico lo apruebe, haga algn tipo de ejercicio regular. Esto ayuda a Arts administrator. Evacuaciones intestinales  Vaya al bao cuando sienta la necesidad de ir. No retenga.  Trate de beber algo caliente para iniciar el movimiento intestinal.  Lleve un registro de la frecuencia con la que va al bao. Si deja de [employer] o tres veces, hable con su mdico acerca de los medicamentos que previenen la constipacin. Su mdico puede sugerirle un ablandador de heces, laxantes o suplementos de fibra.  Tome slo medicamentos de venta libre o recetados, segn las indicaciones del mdico.  No tome otros medicamentos sin Teacher, adult education a su mdico. Si tiene constipacin y toma medicamentos que producen la evacuacin intestinal, el problema puede empeorar. Otros medicamentos tambin Advertising copywriter. SOLICITE ATENCIN MDICA SI:  [place] ablandadores de heces o laxantes y an no ha movido el intestino en 24 o 53 horas despus de usarlos.  No movi el intestino en [employer]. SOLICITE ATENCIN MDICA DE INMEDIATO SI:  La constipacin dura ms de 4 das o empeora.  Observa sangre brillante en las heces.  Siente dolor abdominal o rectal.  Siente clicos muy intensos.  Las heces son delgadas como un lpiz.  Pierde peso de [place] inexplicable.  Tiene fiebre o sntomas que persisten durante ms de 2 o 3 das.  Tiene fiebre y los sntomas empeoran de manera sbita. Esta informacin no tiene Marine scientist el consejo del mdico. Asegrese de hacerle al mdico cualquier pregunta que tenga. Document Released: 11/15/2012 Document Revised: 08/11/2014 Document Reviewed: 09/03/2012 Elsevier  Interactive Patient Education  [employer].

## 2018-06-01 NOTE — Progress Notes (Signed)
Subjective:    Patient ID: Amy Morrow, female    DOB: 1961-04-04, 57 y.o.   MRN: [id number]  HPI       57 year old female who was seen in follow-up.  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  Patient also has a history of prediabetes with last hemoglobin A1c of 5.5 done on 03/18/2018 and patient with history of hyperlipidemia.  At her most recent office visit, patient also had anemia with hemoglobin of 10.5 on her CBC with normal MCV.  Patient with normal CMP with exception of glucose of 120.      Patient reports at today's visit that she feels okay other than some recurrent lower abdominal pain.  Patient is status post recent removal of her ureteral stents but is now having issues with urinary incontinence status post surgery and has to wear incontinence briefs/pads.  Patient states that she was told that there was still an issue with her ureters and she believes that she is supposed to have another surgery in the next 1 to 2 weeks.  Patient reports that she does have some constipation from the tramadol that was prescribed after her recent procedure.  Patient does not believe that she is having any dysuria.  Patient denies any fever or chills.  Patient states that her lower abdominal pain is a dull, aching sensation which is about a 3-4 on a 0-to-10 scale.  Patient states this is been present for a while and believes it may have started after her surgery related to cervical cancer.      Patient reports that she is tolerating the metformin for her prediabetes without any issues.  Patient denies any nausea or diarrhea.  No increased thirst. Past Medical History:  Diagnosis Date  . Abnormal Pap smear   . Anemia   . Cervical cancer [facility]) oncologist- dr Denman George   Stage IB1  SCCa   .  Diabetes mellitus without complication ([place])   . Dyspnea   . Fatigue   . Hyperlipidemia   . Left breast mass 2013   7x4x4 mm 6o'clock  . PONV (postoperative nausea and vomiting)   . Sepsis ([place]) 03/2018   Urosepsis, Resolved  . Thyroid nodule 03/25/2018   inferior right thyroid, noted on US thyroid  . Ureterovaginal fistula   . Urgency of urination    intermittant  . Urinary frequency    Past Surgical History:  Procedure Laterality Date  . ABDOMINAL HYSTERECTOMY    . BREAST BIOPSY Left 06/15/2012  . CYSTOSCOPY W/ RETROGRADES Bilateral 02/16/2018   Procedure: CYSTOSCOPY WITH RETROGRADE PYELOGRAM BILATERAL DIAGNOSTIC URETEROSCOPY, BILATERAL  STENT PLACEMENT;  Surgeon: Ardis Hughs, MD;  Location: [facility];  Service: Urology;  Laterality: Bilateral;  . CYSTOSCOPY W/ URETERAL STENT PLACEMENT Bilateral 04/22/2018   Procedure: CYSTOSCOPY WITH BILATERAL RETROGRADE PYELOGRAM/ AND URETERAL STENT EXCHANGE, vaginal exam;  Surgeon: Ardis Hughs, MD;  Location: [facility];  Service: Urology;  Laterality: Bilateral;  . CYSTOSCOPY W/ URETERAL STENT PLACEMENT Bilateral 05/28/2018   Procedure: CYSTOSCOPY WITH BILATERAL  RETROGRADE PYELOGRAM/URETERAL STENT REPLACEMENT, REMOVAL OF BILATERAL PERCUTANEOUS DRAINS;  Surgeon: Ardis Hughs, MD;  Location: [facility];  Service: Urology;  Laterality: Bilateral;  . D & C LEEP CONIZATION BX  07-31-2003   dr p. rose [facility]  . IR  NEPHROSTOGRAM RIGHT THRU EXISTING ACCESS  03/18/2018  . IR NEPHROSTOMY EXCHANGE LEFT  04/27/2018  . IR NEPHROSTOMY EXCHANGE RIGHT  04/27/2018  . IR NEPHROSTOMY PLACEMENT LEFT  03/18/2018  . IR NEPHROSTOMY PLACEMENT RIGHT  03/02/2018  . PELVIC LYMPH NODE DISSECTION Bilateral 02/08/2018   Procedure: BILATEAL PEVIC  LYMPHADENECTOMY;  Surgeon: [name], MD;  Location: [facility];  Service: Gynecology;  Laterality: Bilateral;  . ROBOTIC ASSISTED TOTAL HYSTERECTOMY WITH BILATERAL SALPINGO OOPHERECTOMY N/A  02/08/2018   Procedure: XI ROBOTIC ASSISTED TOTAL Radical HYSTERECTOMY WITH BILATERAL SALPINGO OOPHORECTOMY;  Surgeon: [name], MD;  Location: [facility];  Service: Gynecology;  Laterality: N/A;  . Transvaginal tape placement  03-12-2009  dr Emeterio Reeve  [facility]   GYNECARE TENSION-FREE VAGINAL TAPE SLING  . TUBAL LIGATION  05-28-2005  DR  MARSHALL  @ [facility]   PPTL   Family History  Problem Relation Age of Onset  . Hypertension Mother   . Heart disease Mother   . Hypertension Brother   . Heart disease Brother     Social History   Tobacco Use  . Smoking status: Never Smoker  . Smokeless tobacco: Never Used  Substance Use Topics  . Alcohol use: No  . Drug use: No  No Known Allergies    Review of Systems  Constitutional: Positive for fatigue (Improved). Negative for chills and fever.  Respiratory: Negative for cough and shortness of breath.   Cardiovascular: Negative for chest pain, palpitations and leg swelling.  Gastrointestinal: Positive for abdominal pain. Negative for nausea.  Endocrine: Negative for polydipsia, polyphagia and polyuria.  Genitourinary: Negative for dysuria and frequency.       Patient with urinary incontinence status post removal of ureteral stents  Musculoskeletal: Positive for back pain (Mild). Negative for arthralgias.  Neurological: Negative for dizziness and headaches.       Objective:   Physical Exam BP 117/80   Pulse 93   Temp 99 F (37.2 C) (Oral)   Resp 18   Ht 4\' 11"  (1.499 m)   Wt 143 lb 12.8 oz (65.2 kg)   LMP 11/16/2012   SpO2 98%   BMI 29.04 kg/m Vital signs and nurse's notes reviewed General-well-nourished, well-developed short statured older female in no acute distress Lungs-clear to auscultation bilaterally Cardiovascular-regular rate and rhythm, borderline tachycardia Abdomen-soft, patient does have some suprapubic/lower abdominal discomfort with palpation, no rebound or guarding Back- patient with Tegaderm covering the 2 sites on the  mid back where patient has had ureteral stent removal.  There is no increased warmth or redness at the site Extremities-no edema        Assessment & Plan:  1. Pre-diabetes Patient with recent abnormal hemoglobin A1c of 5.8.  Patient reports that she is tolerating metformin without difficulty.  Patient is encouraged to follow a low carbohydrate diet.  Patient had glucose done by [facility] as part of standing protocol at today's visit - Glucose (CBG)  2. Generalized abdominal pain Patient with a history of recurrent urinary tract infections status post injury to the urinary system during surgery for treatment of cervical cancer.  Discussed with patient that also some of her abdominal discomfort may be related to postsurgical adhesions/healing from surgery related to her cervical cancer.  If patient has worsening of her abdominal pain, she should discuss this with her oncologist to see if she needs referral back to the person who performed her surgery or she may need further imaging.    3. Therapeutic opioid induced constipation Patient with some  issues with constipation status post use of tramadol for control of pain.  Patient is encouraged to take over-the-counter medication as needed to make sure that she has a bowel movement about every 3 days and patient may also want to take a daily stool softener.  Patient is also encouraged to increase fluid and fiber intake.  Patient declines prescription for MiraLAX at today's visit but can call if she wishes to have the sent into her pharmacy in the future.  4. Hyperlipidemia, unspecified hyperlipidemia type Patient with a history of hyperlipidemia-she is fasting at today's visit, patient will have follow-up lipid panel.  Patient will be notified of the results and if further treatment versus dietary changes are recommended based on the results - Lipid Panel  5. Microcytic anemia Patient with a history of microcytic anemia.  Patient is status post surgical  treatment of cervical cancer which may have caused some decrease in her blood counts.  Patient will have CBC in follow-up to make sure that her anemia has not worsened and patient is encouraged to take an over-the-counter multivitamin or iron supplement but will be further notified based on her results - CBC with Differential  6. Recurrent UTI (urinary tract infection) Patient with history of recurrent urinary tract infection related to ureteral vaginal fistula which developed after surgery for treatment for cervical cancer.  Order was placed at today's visit for patient to have urinalysis however either this test was not obtained by the CMA or not resulted.  Patient does have continued follow-up with urology or may return here if continued symptoms or recurrent symptoms of urinary tract infection.  7. History of cervical cancer Patient reports upcoming follow-up with her oncologist regarding her history of cervical cancer and patient was advised to also mention her issues with recurrent lower abdominal pain to her oncologist  8. Ureterovaginal fistula Patient reports that she has upcoming surgery related to her ureterovaginal fistula that developed after treatment for cervical cancer.  An After Visit Summary was printed and given to the patient.  Return in about 3 months (around 09/01/2018) for anemia/pre-diabetes and as needed.

## 2018-06-01 NOTE — Progress Notes (Signed)
Flu: already has one Pain: 0, took pain pill last night  Amy Morrow [id number]  Patient is unable to use restroom.

## 2018-06-02 ENCOUNTER — Other Ambulatory Visit: Payer: Self-pay | Admitting: Family Medicine

## 2018-06-02 LAB — CBC WITH DIFFERENTIAL/PLATELET
Basophils Absolute: 0 x10E3/uL (ref 0.0–0.2)
Basos: 1 %
EOS (ABSOLUTE): 0.2 x10E3/uL (ref 0.0–0.4)
Eos: 4 %
Hematocrit: 32.7 % — ABNORMAL LOW (ref 34.0–46.6)
Hemoglobin: 10.8 g/dL — ABNORMAL LOW (ref 11.1–15.9)
Immature Grans (Abs): 0 x10E3/uL (ref 0.0–0.1)
Immature Granulocytes: 0 %
Lymphocytes Absolute: 1.9 x10E3/uL (ref 0.7–3.1)
Lymphs: 33 %
MCH: 27.7 pg (ref 26.6–33.0)
MCHC: 33 g/dL (ref 31.5–35.7)
MCV: 84 fL (ref 79–97)
Monocytes Absolute: 0.4 x10E3/uL (ref 0.1–0.9)
Monocytes: 7 %
Neutrophils Absolute: 3.2 x10E3/uL (ref 1.4–7.0)
Neutrophils: 55 %
Platelets: 344 x10E3/uL (ref 150–450)
RBC: 3.9 x10E6/uL (ref 3.77–5.28)
RDW: 13.7 % (ref 12.3–15.4)
WBC: 5.8 x10E3/uL (ref 3.4–10.8)

## 2018-06-02 LAB — LIPID PANEL
Chol/HDL Ratio: 3.1 ratio (ref 0.0–4.4)
Cholesterol, Total: 192 mg/dL (ref 100–199)
HDL: 61 mg/dL
LDL Calculated: 104 mg/dL — ABNORMAL HIGH (ref 0–99)
Triglycerides: 133 mg/dL (ref 0–149)
VLDL Cholesterol Cal: 27 mg/dL (ref 5–40)

## 2018-06-02 NOTE — Progress Notes (Signed)
Patient ID: Amy Morrow, female   DOB: 03-01-1961, 57 y.o.   MRN: [id number]   Patient is prediabetic.  Patient with recent lipid panel showing LDL of 104.  Since patient does not have diabetes and since patient is undergoing treatment at this time for issues with her urinary tract due to ureteral vaginal fistula and has upcoming surgery, we will have patient continue dietary changes and will not add statin medication at this time.

## 2018-06-03 ENCOUNTER — Other Ambulatory Visit: Payer: Self-pay | Admitting: Urology

## 2018-06-10 ENCOUNTER — Telehealth: Payer: Self-pay | Admitting: *Deleted

## 2018-06-10 NOTE — Telephone Encounter (Signed)
Faxed records to both [place] and [employer] with the hyst form

## 2018-06-14 NOTE — Patient Instructions (Addendum)
KHIANA CAMINO  06/14/2018   Your procedure is scheduled on: Thursday 06/24/2018              Report to [facility] Main  Entrance              Report to admitting at   1015 AM    Call this number if you have problems the morning of surgery [phone]    How to Manage Your Diabetes Before and After Surgery  Why is it important to control my blood sugar before and after surgery? . Improving blood sugar levels before and after surgery helps healing and can limit problems. . A way of improving blood sugar control is eating a healthy diet by: o  Eating less sugar and carbohydrates o  Increasing activity/exercise o  Talking with your doctor about reaching your blood sugar goals . High blood sugars (greater than 180 mg/dL) can raise your risk of infections and slow your recovery, so you will need to focus on controlling your diabetes during the weeks before surgery. . Make sure that the doctor who takes care of your diabetes knows about your planned surgery including the date and location.  How do I manage my blood sugar before surgery? . Check your blood sugar at least 4 times a day, starting 2 days before surgery, to make sure that the level is not too high or low. o Check your blood sugar the morning of your surgery when you wake up and every 2 hours until you get to the Short Stay unit. . If your blood sugar is less than 70 mg/dL, you will need to treat for low blood sugar: o Do not take insulin. o Treat a low blood sugar (less than 70 mg/dL) with  cup of clear juice (cranberry or apple), 4 glucose tablets, OR glucose gel. o Recheck blood sugar in 15 minutes after treatment (to make sure it is greater than 70 mg/dL). If your blood sugar is not greater than 70 mg/dL on recheck, call [phone] for further instructions. . Report your blood sugar to the short stay nurse when you get to Short Stay.  . If you are admitted to the hospital after  surgery: o Your blood sugar will be checked by the staff and you will probably be given insulin after surgery (instead of oral diabetes medicines) to make sure you have good blood sugar levels. o The goal for blood sugar control after surgery is 80-180 mg/dL.   WHAT DO I DO ABOUT MY DIABETES MEDICATION?         The day before surgery, Take Metformin (Glucophage XL) as usual.  . Do not take oral diabetes medicines (pills) the morning of surgery.        Remember: Do not eat food or drink liquids :After Midnight.               BRUSH YOUR TEETH MORNING OF SURGERY AND RINSE YOUR MOUTH OUT, NO CHEWING GUM CANDY OR MINTS.     Take these medicines the morning of surgery with A SIP OF WATER: Tramadol if needed             DO NOT TAKE ANY DIABETIC MEDICATIONS DAY OF YOUR SURGERY                               You  may not have any metal on your body including hair pins and              piercings  Do not wear jewelry, make-up, lotions, powders or perfumes, deodorant             Do not wear nail polish.  Do not shave  48 hours prior to surgery.           Do not bring valuables to the hospital. [place].  Contacts, dentures or bridgework may not be worn into surgery.  Leave suitcase in the car. After surgery it may be brought to your room.                  Please read over the following fact sheets you were given: _____________________________________________________________________             [facility] - Preparing for Surgery Before surgery, you can play an important role.  Because skin is not sterile, your skin needs to be as free of germs as possible.  You can reduce the number of germs on your skin by washing with CHG (chlorahexidine gluconate) soap before surgery.  CHG is an antiseptic cleaner which kills germs and bonds with the skin to continue killing germs even after washing. Please DO NOT use if you have an allergy to CHG or  antibacterial soaps.  If your skin becomes reddened/irritated stop using the CHG and inform your nurse when you arrive at Short Stay. Do not shave (including legs and underarms) for at least 48 hours prior to the first CHG shower.  You may shave your face/neck. Please follow these instructions carefully:  1.  Shower with CHG Soap the night before surgery and the  morning of Surgery.  2.  If you choose to wash your hair, wash your hair first as usual with your  normal  shampoo.  3.  After you shampoo, rinse your hair and body thoroughly to remove the  shampoo.                           4.  Use CHG as you would any other liquid soap.  You can apply chg directly  to the skin and wash                       Gently with a scrungie or clean washcloth.  5.  Apply the CHG Soap to your body ONLY FROM THE NECK DOWN.   Do not use on face/ open                           Wound or open sores. Avoid contact with eyes, ears mouth and genitals (private parts).                       Wash face,  Genitals (private parts) with your normal soap.             6.  Wash thoroughly, paying special attention to the area where your surgery  will be performed.  7.  Thoroughly rinse your body with warm water from the neck down.  8.  DO NOT shower/wash with your normal soap after using and rinsing off  the CHG Soap.  9.  Pat yourself dry with a clean towel.            10.  Wear clean pajamas.            11.  Place clean sheets on your bed the night of your first shower and do not  sleep with pets. Day of Surgery : Do not apply any lotions/deodorants the morning of surgery.  Please wear clean clothes to the hospital/surgery center.  FAILURE TO FOLLOW THESE INSTRUCTIONS MAY RESULT IN THE CANCELLATION OF YOUR SURGERY PATIENT SIGNATURE_________________________________  NURSE SIGNATURE__________________________________  ________________________________________________________________________

## 2018-06-14 NOTE — Progress Notes (Signed)
06/01/2018- noted in Epic- CBC w/diff., Lipid panel  05/28/2018- noted in Epic- I stat Chem 8  04/22/2018- noted in Epic- EKG  03/04/2018-noted in Epic- CXR 1 view

## 2018-06-16 ENCOUNTER — Telehealth: Payer: Self-pay | Admitting: Oncology

## 2018-06-16 ENCOUNTER — Emergency Department (HOSPITAL_COMMUNITY)
Admission: EM | Admit: 2018-06-16 | Discharge: 2018-06-16 | Disposition: A | Discharge status: Home / Self Care | Payer: Medicaid Other | Attending: Emergency Medicine | Admitting: Emergency Medicine

## 2018-06-16 ENCOUNTER — Other Ambulatory Visit: Payer: Self-pay | Admitting: Gynecologic Oncology

## 2018-06-16 ENCOUNTER — Encounter (HOSPITAL_COMMUNITY): Payer: Self-pay | Admitting: Emergency Medicine

## 2018-06-16 ENCOUNTER — Emergency Department (HOSPITAL_COMMUNITY): Payer: Medicaid Other

## 2018-06-16 DIAGNOSIS — R1032 Left lower quadrant pain: Secondary | ICD-10-CM

## 2018-06-16 DIAGNOSIS — E785 Hyperlipidemia, unspecified: Secondary | ICD-10-CM | POA: Diagnosis not present

## 2018-06-16 DIAGNOSIS — R1084 Generalized abdominal pain: Secondary | ICD-10-CM | POA: Diagnosis present

## 2018-06-16 DIAGNOSIS — Z7984 Long term (current) use of oral hypoglycemic drugs: Secondary | ICD-10-CM | POA: Diagnosis not present

## 2018-06-16 DIAGNOSIS — N3001 Acute cystitis with hematuria: Secondary | ICD-10-CM

## 2018-06-16 DIAGNOSIS — E119 Type 2 diabetes mellitus without complications: Secondary | ICD-10-CM | POA: Insufficient documentation

## 2018-06-16 LAB — COMPREHENSIVE METABOLIC PANEL
ALBUMIN: 4.4 g/dL (ref 3.5–5.0)
ALT: 13 U/L (ref 0–44)
AST: 18 U/L (ref 15–41)
Alkaline Phosphatase: 97 U/L (ref 38–126)
Anion gap: 8 (ref 5–15)
BUN: 16 mg/dL (ref 6–20)
CALCIUM: 9.3 mg/dL (ref 8.9–10.3)
CHLORIDE: 100 mmol/L (ref 98–111)
CO2: 28 mmol/L (ref 22–32)
CREATININE: 0.74 mg/dL (ref 0.44–1.00)
GFR calc non Af Amer: >60 mL/min (ref >60)
GLUCOSE: 123 mg/dL — AB (ref 70–99)
Potassium: 3.4 mmol/L — ABNORMAL LOW (ref 3.5–5.1)
SODIUM: 136 mmol/L (ref 135–145)
Total Bilirubin: 0.6 mg/dL (ref 0.3–1.2)
Total Protein: 8.6 g/dL — ABNORMAL HIGH (ref 6.5–8.1)

## 2018-06-16 LAB — DIFFERENTIAL
BASOS ABS: 0 10*3/uL (ref 0.0–0.1)
Basophils Relative: 1 %
Eosinophils Absolute: 0.2 10*3/uL (ref 0.0–0.5)
Eosinophils Relative: 3 %
LYMPHS PCT: 30 %
Lymphs Abs: 2.3 10*3/uL (ref 0.7–4.0)
Monocytes Absolute: 0.7 10*3/uL (ref 0.1–1.0)
Monocytes Relative: 9 %
NEUTROS ABS: 4.6 10*3/uL (ref 1.7–7.7)
NEUTROS PCT: 58 %

## 2018-06-16 LAB — CBC
HEMATOCRIT: 38.8 % (ref 36.0–46.0)
Hemoglobin: 12.3 g/dL (ref 12.0–15.0)
MCH: 27.5 pg (ref 26.0–34.0)
MCHC: 31.7 g/dL (ref 30.0–36.0)
MCV: 86.8 fL (ref 80.0–100.0)
NRBC: 0 % (ref 0.0–0.2)
Platelets: 371 10*3/uL (ref 150–400)
RBC: 4.47 MIL/uL (ref 3.87–5.11)
RDW: 14.8 % (ref 11.5–15.5)
WBC: 7.8 10*3/uL (ref 4.0–10.5)

## 2018-06-16 LAB — LIPASE, BLOOD: LIPASE: 27 U/L (ref 11–51)

## 2018-06-16 LAB — URINALYSIS, ROUTINE W REFLEX MICROSCOPIC
Bilirubin Urine: NEGATIVE
Glucose, UA: NEGATIVE mg/dL
KETONES UR: NEGATIVE mg/dL
Nitrite: POSITIVE — AB
PROTEIN: 30 mg/dL — AB
Specific Gravity, Urine: >1.046 — ABNORMAL HIGH (ref 1.005–1.030)
WBC, UA: >50 WBC/hpf — ABNORMAL HIGH (ref 0–5)
pH: 6 (ref 5.0–8.0)

## 2018-06-16 MED ORDER — HYDROMORPHONE HCL 1 MG/ML IJ SOLN
0.5000 mg | Freq: Once | INTRAMUSCULAR | Status: AC
  Administered 2018-06-16: 0.5 mg via INTRAVENOUS
  Filled 2018-06-16: qty 1

## 2018-06-16 MED ORDER — IOPAMIDOL (ISOVUE-300) INJECTION 61%
INTRAVENOUS | Status: AC
  Filled 2018-06-16: qty 100

## 2018-06-16 MED ORDER — CEFTRIAXONE SODIUM 1 G IJ SOLR
1.0000 g | Freq: Once | INTRAMUSCULAR | Status: AC
  Administered 2018-06-16: 1 g via INTRAMUSCULAR
  Filled 2018-06-16: qty 10

## 2018-06-16 MED ORDER — IOPAMIDOL (ISOVUE-300) INJECTION 61%
100.0000 mL | Freq: Once | INTRAVENOUS | Status: AC | PRN
  Administered 2018-06-16: 100 mL via INTRAVENOUS

## 2018-06-16 MED ORDER — ONDANSETRON HCL 4 MG/2ML IJ SOLN
4.0000 mg | Freq: Once | INTRAMUSCULAR | Status: AC
  Administered 2018-06-16: 4 mg via INTRAVENOUS
  Filled 2018-06-16: qty 2

## 2018-06-16 MED ORDER — ONDANSETRON 4 MG PO TBDP: ORAL_TABLET | ORAL | 0 refills | Status: DC

## 2018-06-16 MED ORDER — SODIUM CHLORIDE 0.9 % IV BOLUS
500.0000 mL | Freq: Once | INTRAVENOUS | Status: AC
  Administered 2018-06-16: 500 mL via INTRAVENOUS

## 2018-06-16 MED ORDER — STERILE WATER FOR INJECTION IJ SOLN
INTRAMUSCULAR | Status: AC
  Administered 2018-06-16: 2 mL
  Filled 2018-06-16: qty 10

## 2018-06-16 MED ORDER — CEPHALEXIN 500 MG PO CAPS: 500.0000 mg | ORAL_CAPSULE | Freq: Four times a day (QID) | ORAL | 0 refills | Status: DC

## 2018-06-16 MED ORDER — TRAMADOL HCL 50 MG PO TABS: 50.0000 mg | ORAL_TABLET | Freq: Four times a day (QID) | ORAL | 0 refills | Status: DC | PRN

## 2018-06-16 NOTE — Telephone Encounter (Signed)
Mcarthur Rossetti, Medical Interpreter, and advised her that Joylene John, NP has ordered a CT scan for Marynell and that I will work on scheduling it and to let patient know.  Almyra Free said Lamis may go to the ER this morning for pain.

## 2018-06-16 NOTE — ED Triage Notes (Signed)
Patient here from home with complaints left lower quadrant abd pain. Spoke with PCP this morning recommended scheduling CT, but patient could not wait. Pain increased. Denies n/v.

## 2018-06-16 NOTE — ED Notes (Signed)
Interpreter used via [employer]. Denman George [phone]

## 2018-06-16 NOTE — ED Notes (Signed)
RN called [facility] and spoke with Triage RN Mickel Baas at [facility] and informed them that patient is being treated for UTI.   [facility] [phone]

## 2018-06-16 NOTE — Discharge Instructions (Signed)
Follow-up with your urologist Friday as planned.  You need to see a provider because you have a urinary tract infection and they may want to delay surgery

## 2018-06-16 NOTE — ED Provider Notes (Signed)
[facility] DEPT Provider Note   CSN: [id number] Arrival date & time: 06/16/18  7564     History   Chief Complaint Chief Complaint  Patient presents with  . Abdominal Pain    HPI Amy Morrow is a 57 y.o. female.  Patient complains of lower abdominal pain.  She was sent over by her doctor for evaluation and CT scan  The history is provided by the patient. No language interpreter was used.  Abdominal Pain   This is a recurrent problem. The current episode started more than 2 days ago. The problem occurs constantly. The pain is associated with an unknown factor. The pain is located in the generalized abdominal region. The pain is at a severity of 6/10. The pain is moderate. Pertinent negatives include anorexia, fever, vomiting, dysuria, hematuria and arthralgias. Nothing aggravates the symptoms.    Past Medical History:  Diagnosis Date  . Abnormal Pap smear   . Anemia   . Cervical cancer [facility]) oncologist- dr Denman George   Stage IB1  SCCa   . Diabetes mellitus without complication ([place])   . Dyspnea   . Fatigue   . Hyperlipidemia   . Left breast mass 2013   7x4x4 mm 6o'clock  . PONV (postoperative nausea and vomiting)   . Sepsis ([place]) 03/2018   Urosepsis, Resolved  . Thyroid nodule 03/25/2018   inferior right thyroid, noted on US thyroid  . Ureterovaginal fistula   . Urgency of urination    intermittant  . Urinary frequency     Patient Active Problem List   Diagnosis Date Noted  . Hypotension   . Anemia 03/03/2018  . Sepsis ([place]) 03/02/2018  . 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 Vaginal discharge 02/16/2018  . Cervical cancer, FIGO stage IB2 ([place]) 02/08/2018  . Cervical cancer, FIGO stage IB1 ([place]) 02/08/2018  . Abnormal uterine bleeding (AUB) 12/07/2017  . Neuropathic pain 02/06/2014  . Lichen planus 33/29/5188  . Dysuria 01/25/2013  . Cervical cancer ([place]) 07/29/2012  . Perimenopause 07/29/2012  . H/O burning pain in leg  06/24/2012  . CIN I (cervical intraepithelial neoplasia I) 07/11/2011  . LSIL (low grade squamous intraepithelial lesion) on Pap smear 06/11/2011    Past Surgical History:  Procedure Laterality Date  . ABDOMINAL HYSTERECTOMY    . BREAST BIOPSY Left 06/15/2012  . CYSTOSCOPY W/ RETROGRADES Bilateral 02/16/2018   Procedure: CYSTOSCOPY WITH RETROGRADE PYELOGRAM BILATERAL DIAGNOSTIC URETEROSCOPY, BILATERAL  STENT PLACEMENT;  Surgeon: Ardis Hughs, MD;  Location: [facility];  Service: Urology;  Laterality: Bilateral;  . CYSTOSCOPY W/ URETERAL STENT PLACEMENT Bilateral 04/22/2018   Procedure: CYSTOSCOPY WITH BILATERAL RETROGRADE PYELOGRAM/ AND URETERAL STENT EXCHANGE, vaginal exam;  Surgeon: Ardis Hughs, MD;  Location: [facility];  Service: Urology;  Laterality: Bilateral;  . CYSTOSCOPY W/ URETERAL STENT PLACEMENT Bilateral 05/28/2018   Procedure: CYSTOSCOPY WITH BILATERAL  RETROGRADE PYELOGRAM/URETERAL STENT REPLACEMENT, REMOVAL OF BILATERAL PERCUTANEOUS DRAINS;  Surgeon: Ardis Hughs, MD;  Location: [facility];  Service: Urology;  Laterality: Bilateral;  . D & C LEEP CONIZATION BX  07-31-2003   dr p. rose [facility]  . IR NEPHROSTOGRAM RIGHT THRU EXISTING ACCESS  03/18/2018  . IR NEPHROSTOMY EXCHANGE LEFT  04/27/2018  . IR NEPHROSTOMY EXCHANGE RIGHT  04/27/2018  . IR NEPHROSTOMY PLACEMENT LEFT  03/18/2018  . IR NEPHROSTOMY PLACEMENT RIGHT  03/02/2018  . PELVIC LYMPH NODE DISSECTION Bilateral 02/08/2018   Procedure: BILATEAL PEVIC  LYMPHADENECTOMY;  Surgeon: [name], MD;  Location:  [facility];  Service: Gynecology;  Laterality: Bilateral;  . ROBOTIC ASSISTED TOTAL HYSTERECTOMY WITH BILATERAL SALPINGO OOPHERECTOMY N/A 02/08/2018   Procedure: XI ROBOTIC ASSISTED TOTAL Radical HYSTERECTOMY WITH BILATERAL SALPINGO OOPHORECTOMY;  Surgeon: [name], MD;  Location: [facility];  Service: Gynecology;  Laterality: N/A;  . Transvaginal tape placement  03-12-2009  dr Emeterio Reeve   [facility]   GYNECARE TENSION-FREE VAGINAL TAPE SLING  . TUBAL LIGATION  05-28-2005  DR  MARSHALL  @ [facility]   [facility]     OB History    Gravida  6   Para  5   Term  5   Preterm  0   AB  1   Living  5     SAB  1   TAB  0   Ectopic  0   Multiple  0   Live Births  5            Home Medications    Prior to Admission medications   Medication Sig Start Date End Date Taking? Authorizing Provider  acetaminophen (TYLENOL) 500 MG tablet Take 500 mg by mouth every 6 (six) hours as needed for moderate pain, fever or headache.    Yes [provider]  metFORMIN (GLUCOPHAGE XR) 500 MG 24 hr tablet Take 1 tablet (500 mg total) by mouth daily with breakfast. 02/23/18  Yes [name], [name]  cephALEXin (KEFLEX) 500 MG capsule Take 1 capsule (500 mg total) by mouth 4 (four) times daily. 06/16/18   Milton Ferguson, MD  ondansetron (ZOFRAN ODT) 4 MG disintegrating tablet 4mg  ODT q4 hours prn nausea/vomit 06/16/18   Milton Ferguson, MD  phenazopyridine (PYRIDIUM) 200 MG tablet Take 1 tablet (200 mg total) by mouth 3 (three) times daily as needed for pain. Patient not taking: Reported on 06/16/2018 05/28/18   Ardis Hughs, MD  traMADol (ULTRAM) 50 MG tablet Take 1 tablet (50 mg total) by mouth every 6 (six) hours as needed. 06/16/18   Milton Ferguson, MD    Family History Family History  Problem Relation Age of Onset  . Hypertension Mother   . Heart disease Mother   . Hypertension Brother   . Heart disease Brother     Social History Social History   Tobacco Use  . Smoking status: Never Smoker  . Smokeless tobacco: Never Used  Substance Use Topics  . Alcohol use: No  . Drug use: No     Allergies   Patient has no known allergies.   Review of Systems Review of Systems  Constitutional: Negative for chills and fever.  HENT: Negative for ear pain and sore throat.   Eyes: Negative for pain and visual disturbance.  Respiratory: Negative for cough and shortness of  breath.   Cardiovascular: Negative for chest pain and palpitations.  Gastrointestinal: Positive for abdominal pain. Negative for anorexia and vomiting.  Genitourinary: Negative for dysuria and hematuria.  Musculoskeletal: Negative for arthralgias and back pain.  Skin: Negative for color change and rash.  Neurological: Negative for seizures and syncope.  All other systems reviewed and are negative.    Physical Exam Updated Vital Signs BP 115/82 (BP Location: Left Arm)   Pulse 100   Temp 99.9 F (37.7 C) (Oral)   Resp 17   LMP 11/16/2012   SpO2 99%   Physical Exam  Constitutional: She is oriented to person, place, and time. She appears well-developed.  HENT:  Head: Normocephalic.  Eyes: Conjunctivae and EOM are normal. No scleral icterus.  Neck:  Neck supple. No thyromegaly present.  Cardiovascular: Normal rate and regular rhythm. Exam reveals no gallop and no friction rub.  No murmur heard. Pulmonary/Chest: No stridor. She has no wheezes. She has no rales. She exhibits no tenderness.  Abdominal: She exhibits no distension. There is tenderness. There is no rebound.  Musculoskeletal: Normal range of motion. She exhibits no edema.  Lymphadenopathy:    She has no cervical adenopathy.  Neurological: She is oriented to person, place, and time. She exhibits normal muscle tone. Coordination normal.  Skin: No rash noted. No erythema.  Psychiatric: She has a normal mood and affect. Her behavior is normal.     ED Treatments / Results  Labs (all labs ordered are listed, but only abnormal results are displayed) Labs Reviewed  COMPREHENSIVE METABOLIC PANEL - Abnormal; Notable for the following components:      Result Value   Potassium 3.4 (*)    Glucose, Bld 123 (*)    Total Protein 8.6 (*)    All other components within normal limits  URINALYSIS, ROUTINE W REFLEX MICROSCOPIC - Abnormal; Notable for the following components:   APPearance TURBID (*)    Specific Gravity, Urine  >1.046 (*)    Hgb urine dipstick MODERATE (*)    Protein, ur 30 (*)    Nitrite POSITIVE (*)    Leukocytes, UA LARGE (*)    WBC, UA >50 (*)    Bacteria, UA FEW (*)    All other components within normal limits  URINE CULTURE  LIPASE, BLOOD  CBC  DIFFERENTIAL    EKG None  Radiology Ct Abdomen Pelvis W Contrast  Result Date: 06/16/2018 CLINICAL DATA:  Bilateral lower pelvic pain. EXAM: CT ABDOMEN AND PELVIS WITH CONTRAST TECHNIQUE: Multidetector CT imaging of the abdomen and pelvis was performed using the standard protocol following bolus administration of intravenous contrast. CONTRAST:  100 mL ISOVUE-300 IOPAMIDOL (ISOVUE-300) INJECTION 61% COMPARISON:  CT scan of March 03, 2018. FINDINGS: Lower chest: No acute abnormality. Hepatobiliary: No focal liver abnormality is seen. No gallstones, gallbladder wall thickening, or biliary dilatation. Pancreas: Unremarkable. No pancreatic ductal dilatation or surrounding inflammatory changes. Spleen: Normal in size without focal abnormality. Adrenals/Urinary Tract: Adrenal glands appear normal. Bilateral ureteral stents are noted. Right ureteral stent extends from right renal pelvis to urinary bladder. Left ureteral stent extends from proximal left ureter to urinary bladder. Mild bilateral hydronephrosis is noted. Urinary bladder is otherwise unremarkable. Stomach/Bowel: Stomach is within normal limits. Appendix appears normal. No evidence of bowel wall thickening, distention, or inflammatory changes. Vascular/Lymphatic: No significant vascular findings are present. No enlarged abdominal or pelvic lymph nodes. Reproductive: Status post hysterectomy. No adnexal masses. Other: No abdominal wall hernia or abnormality. No abdominopelvic ascites. Musculoskeletal: No acute or significant osseous findings. IMPRESSION: Interval placement of bilateral ureteral stents. Mild bilateral hydronephrosis is noted. No other significant abnormality seen in the abdomen or  pelvis. Electronically Signed   By: Marijo Conception, M.D.   On: 06/16/2018 14:10    Procedures Procedures (including critical care time)  Medications Ordered in ED Medications  cefTRIAXone (ROCEPHIN) injection 1 g (has no administration in time range)  sodium chloride 0.9 % bolus 500 mL (0 mLs Intravenous Stopped 06/16/18 1248)  HYDROmorphone (DILAUDID) injection 0.5 mg (0.5 mg Intravenous Given 06/16/18 1117)  ondansetron (ZOFRAN) injection 4 mg (4 mg Intravenous Given 06/16/18 1113)  iopamidol (ISOVUE-300) 61 % injection 100 mL (100 mLs Intravenous Contrast Given 06/16/18 1347)     Initial Impression / Assessment and Plan /  ED Course  I have reviewed the triage vital signs and the nursing notes.  Pertinent labs & imaging results that were available during my care of the patient were reviewed by me and considered in my medical decision making (see chart for details).     Labs unremarkable except for urinalysis shows patient has urinary tract infection.  CT scan shows no acute changes.  She will be given Keflex along with a shot of Rocephin and sent home with pain and nausea medicine and Keflex as an outpatient.  She will follow-up with urology in 2 days  Final Clinical Impressions(s) / ED Diagnoses   Final diagnoses:  Acute cystitis with hematuria    ED Discharge Orders         Ordered    cephALEXin (KEFLEX) 500 MG capsule  4 times daily     06/16/18 1606    traMADol (ULTRAM) 50 MG tablet  Every 6 hours PRN     06/16/18 1606    ondansetron (ZOFRAN ODT) 4 MG disintegrating tablet     06/16/18 1606           Milton Ferguson, MD 06/17/18 1323

## 2018-06-16 NOTE — ED Notes (Signed)
ED Provider at bedside. 

## 2018-06-16 NOTE — Progress Notes (Signed)
Almyra Free, interpreter, reached out to the office yesterday stating the patient is having moderate left abdominal pain similar to what she experienced when she developed her initial issues after surgery.  Discussed with Dr. Denman George who recommends proceeding with an CT scan to evaluate the abdomen due to pain.  Brantley Fling that if the patient's pain is severe she could seek care at the ER in order to have evaluation sooner with a scan.

## 2018-06-16 NOTE — ED Notes (Signed)
Patient IV was occluded when returning to room from CT.

## 2018-06-18 ENCOUNTER — Encounter (HOSPITAL_COMMUNITY): Payer: Self-pay | Admitting: *Deleted

## 2018-06-18 ENCOUNTER — Other Ambulatory Visit: Payer: Self-pay

## 2018-06-18 ENCOUNTER — Encounter (HOSPITAL_COMMUNITY)
Admission: RE | Admit: 2018-06-18 | Discharge: 2018-06-18 | Disposition: A | Discharge status: Home / Self Care | Payer: Medicaid Other | Source: Ambulatory Visit | Attending: Urology | Admitting: Urology

## 2018-06-18 DIAGNOSIS — Z01812 Encounter for preprocedural laboratory examination: Secondary | ICD-10-CM | POA: Diagnosis present

## 2018-06-18 LAB — BASIC METABOLIC PANEL
ANION GAP: 6 (ref 5–15)
BUN: 12 mg/dL (ref 6–20)
CHLORIDE: 106 mmol/L (ref 98–111)
CO2: 29 mmol/L (ref 22–32)
CREATININE: 0.59 mg/dL (ref 0.44–1.00)
Calcium: 8.8 mg/dL — ABNORMAL LOW (ref 8.9–10.3)
GFR calc non Af Amer: >60 mL/min (ref >60)
Glucose, Bld: 115 mg/dL — ABNORMAL HIGH (ref 70–99)
POTASSIUM: 3.6 mmol/L (ref 3.5–5.1)
SODIUM: 141 mmol/L (ref 135–145)

## 2018-06-18 LAB — GLUCOSE, CAPILLARY: GLUCOSE-CAPILLARY: 126 mg/dL — AB (ref 70–99)

## 2018-06-18 LAB — HEMOGLOBIN A1C
Hgb A1c MFr Bld: 5.9 % — ABNORMAL HIGH (ref 4.8–5.6)
Mean Plasma Glucose: 122.63 mg/dL

## 2018-06-19 LAB — URINE CULTURE

## 2018-06-20 ENCOUNTER — Telehealth: Payer: Self-pay

## 2018-06-20 NOTE — Telephone Encounter (Signed)
Post ED Visit - Positive Culture Follow-up  Culture report reviewed by antimicrobial stewardship pharmacist:  []  Elenor Quinones, Pharm.D. []  Heide Guile, Pharm.D., BCPS AQ-ID [x]  [place]ks Ne[place]une, Pharm.D., BCPS []  Alycia Rossetti, Pharm.D., BCPS []  [place], Pharm.D., BCPS, AAHIVP []  Legrand Como, Pharm.D., BCPS, AAHIVP []  Salome Arnt, PharmD, BCPS []  Johnnette Gourd, PharmD, BCPS []  Hughes Better, PharmD, BCPS []  Leeroy Cha, PharmD  Positive urine culture Treated with Cephalexin, organism sensitive to the same and no further patient follow-up is required at this time.  Genia Del 06/20/2018, 11:08 AM

## 2018-06-22 ENCOUNTER — Ambulatory Visit (HOSPITAL_COMMUNITY): Payer: Medicaid Other

## 2018-06-23 MED ORDER — GENTAMICIN SULFATE 40 MG/ML IJ SOLN
5.0000 mg/kg | INTRAVENOUS | Status: AC
  Administered 2018-06-24: 270 mg via INTRAVENOUS
  Filled 2018-06-23: qty 6.75

## 2018-06-23 NOTE — Progress Notes (Signed)
Patient called with [employer] and informed to arrive at 1000am.

## 2018-06-24 ENCOUNTER — Inpatient Hospital Stay (HOSPITAL_COMMUNITY): Payer: Medicaid Other | Admitting: Anesthesiology

## 2018-06-24 ENCOUNTER — Encounter (HOSPITAL_COMMUNITY): Payer: Self-pay | Admitting: Anesthesiology

## 2018-06-24 ENCOUNTER — Inpatient Hospital Stay (HOSPITAL_COMMUNITY)
Admission: RE | Admit: 2018-06-24 | Discharge: 2018-06-29 | DRG: 982 | Disposition: A | Discharge status: Home / Self Care | Payer: Medicaid Other | Attending: Urology | Admitting: Urology

## 2018-06-24 ENCOUNTER — Encounter (HOSPITAL_COMMUNITY)
Admission: RE | Disposition: A | Discharge status: Home / Self Care | Payer: Self-pay | Source: Home / Self Care | Attending: Urology

## 2018-06-24 DIAGNOSIS — R509 Fever, unspecified: Secondary | ICD-10-CM

## 2018-06-24 DIAGNOSIS — R0981 Nasal congestion: Secondary | ICD-10-CM

## 2018-06-24 DIAGNOSIS — N821 Other female urinary-genital tract fistulae: Secondary | ICD-10-CM | POA: Diagnosis present

## 2018-06-24 DIAGNOSIS — Z8541 Personal history of malignant neoplasm of cervix uteri: Secondary | ICD-10-CM

## 2018-06-24 DIAGNOSIS — N12 Tubulo-interstitial nephritis, not specified as acute or chronic: Secondary | ICD-10-CM | POA: Diagnosis not present

## 2018-06-24 DIAGNOSIS — Z87440 Personal history of urinary (tract) infections: Secondary | ICD-10-CM | POA: Diagnosis not present

## 2018-06-24 DIAGNOSIS — T8183XA Persistent postprocedural fistula, initial encounter: Principal | ICD-10-CM | POA: Diagnosis present

## 2018-06-24 DIAGNOSIS — N393 Stress incontinence (female) (male): Secondary | ICD-10-CM | POA: Diagnosis present

## 2018-06-24 DIAGNOSIS — S3710XA Unspecified injury of ureter, initial encounter: Secondary | ICD-10-CM | POA: Diagnosis present

## 2018-06-24 LAB — CBC
HCT: 30.9 % — ABNORMAL LOW (ref 36.0–46.0)
Hemoglobin: 9.8 g/dL — ABNORMAL LOW (ref 12.0–15.0)
MCH: 27.7 pg (ref 26.0–34.0)
MCHC: 31.7 g/dL (ref 30.0–36.0)
MCV: 87.3 fL (ref 80.0–100.0)
NRBC: 0 % (ref 0.0–0.2)
PLATELETS: 315 10*3/uL (ref 150–400)
RBC: 3.54 MIL/uL — AB (ref 3.87–5.11)
RDW: 14.6 % (ref 11.5–15.5)
WBC: 13 10*3/uL — AB (ref 4.0–10.5)

## 2018-06-24 LAB — TYPE AND SCREEN
ABO/RH(D): O POS
Antibody Screen: NEGATIVE

## 2018-06-24 LAB — BASIC METABOLIC PANEL
Anion gap: 9 (ref 5–15)
BUN: 11 mg/dL (ref 6–20)
CALCIUM: 8.2 mg/dL — AB (ref 8.9–10.3)
CO2: 25 mmol/L (ref 22–32)
CREATININE: 0.6 mg/dL (ref 0.44–1.00)
Chloride: 103 mmol/L (ref 98–111)
Glucose, Bld: 185 mg/dL — ABNORMAL HIGH (ref 70–99)
Potassium: 3.5 mmol/L (ref 3.5–5.1)
SODIUM: 137 mmol/L (ref 135–145)

## 2018-06-24 LAB — GLUCOSE, CAPILLARY
GLUCOSE-CAPILLARY: 158 mg/dL — AB (ref 70–99)
Glucose-Capillary: 100 mg/dL — ABNORMAL HIGH (ref 70–99)

## 2018-06-24 SURGERY — REIMPLANTATION, URETER, ROBOT-ASSISTED, LAPAROSCOPIC
Anesthesia: General | Laterality: Bilateral

## 2018-06-24 MED ORDER — DEXAMETHASONE SODIUM PHOSPHATE 10 MG/ML IJ SOLN
INTRAMUSCULAR | Status: DC | PRN
  Administered 2018-06-24: 10 mg via INTRAVENOUS

## 2018-06-24 MED ORDER — OXYCODONE HCL 5 MG/5ML PO SOLN
5.0000 mg | Freq: Once | ORAL | Status: DC | PRN
  Filled 2018-06-24: qty 5

## 2018-06-24 MED ORDER — SUGAMMADEX SODIUM 200 MG/2ML IV SOLN
INTRAVENOUS | Status: AC
  Filled 2018-06-24: qty 6

## 2018-06-24 MED ORDER — MIDAZOLAM HCL 5 MG/5ML IJ SOLN
INTRAMUSCULAR | Status: DC | PRN
  Administered 2018-06-24: 2 mg via INTRAVENOUS

## 2018-06-24 MED ORDER — SUGAMMADEX SODIUM 200 MG/2ML IV SOLN
INTRAVENOUS | Status: DC | PRN
  Administered 2018-06-24: 150 mg via INTRAVENOUS

## 2018-06-24 MED ORDER — FENTANYL CITRATE (PF) 250 MCG/5ML IJ SOLN
INTRAMUSCULAR | Status: AC
  Filled 2018-06-24: qty 5

## 2018-06-24 MED ORDER — LACTATED RINGERS IR SOLN
Status: DC | PRN
  Administered 2018-06-24: 1000 mL

## 2018-06-24 MED ORDER — MORPHINE SULFATE (PF) 2 MG/ML IV SOLN
2.0000 mg | INTRAVENOUS | Status: DC | PRN
  Administered 2018-06-25: 2 mg via INTRAVENOUS
  Filled 2018-06-24: qty 1

## 2018-06-24 MED ORDER — CEFAZOLIN SODIUM-DEXTROSE 2-4 GM/100ML-% IV SOLN
2.0000 g | INTRAVENOUS | Status: AC
  Administered 2018-06-24: 2 g via INTRAVENOUS
  Filled 2018-06-24: qty 100

## 2018-06-24 MED ORDER — HYDROMORPHONE HCL 1 MG/ML IJ SOLN
INTRAMUSCULAR | Status: DC | PRN
  Administered 2018-06-24 (×2): 0.5 mg via INTRAVENOUS
  Administered 2018-06-24: 1 mg via INTRAVENOUS

## 2018-06-24 MED ORDER — LIDOCAINE 2% (20 MG/ML) 5 ML SYRINGE
INTRAMUSCULAR | Status: DC | PRN
  Administered 2018-06-24: 80 mg via INTRAVENOUS

## 2018-06-24 MED ORDER — ARTIFICIAL TEARS OPHTHALMIC OINT
TOPICAL_OINTMENT | OPHTHALMIC | Status: AC
  Filled 2018-06-24: qty 3.5

## 2018-06-24 MED ORDER — FENTANYL CITRATE (PF) 100 MCG/2ML IJ SOLN
INTRAMUSCULAR | Status: DC | PRN
  Administered 2018-06-24 (×5): 50 ug via INTRAVENOUS
  Administered 2018-06-24: 100 ug via INTRAVENOUS
  Administered 2018-06-24: 50 ug via INTRAVENOUS

## 2018-06-24 MED ORDER — ROCURONIUM BROMIDE 10 MG/ML (PF) SYRINGE
PREFILLED_SYRINGE | INTRAVENOUS | Status: DC | PRN
  Administered 2018-06-24: 20 mg via INTRAVENOUS
  Administered 2018-06-24 (×2): 10 mg via INTRAVENOUS
  Administered 2018-06-24: 20 mg via INTRAVENOUS
  Administered 2018-06-24: 50 mg via INTRAVENOUS

## 2018-06-24 MED ORDER — ACETAMINOPHEN 10 MG/ML IV SOLN
1000.0000 mg | Freq: Four times a day (QID) | INTRAVENOUS | Status: AC
  Administered 2018-06-25 (×4): 1000 mg via INTRAVENOUS
  Filled 2018-06-24 (×4): qty 100

## 2018-06-24 MED ORDER — ONDANSETRON HCL 4 MG/2ML IJ SOLN
INTRAMUSCULAR | Status: AC
  Filled 2018-06-24: qty 2

## 2018-06-24 MED ORDER — ONDANSETRON HCL 4 MG/2ML IJ SOLN
INTRAMUSCULAR | Status: DC | PRN
  Administered 2018-06-24: 4 mg via INTRAVENOUS

## 2018-06-24 MED ORDER — ONDANSETRON HCL 4 MG/2ML IJ SOLN: 4.0000 mg | Freq: Once | INTRAMUSCULAR | Status: DC | PRN

## 2018-06-24 MED ORDER — MIDAZOLAM HCL 2 MG/2ML IJ SOLN
INTRAMUSCULAR | Status: AC
  Filled 2018-06-24: qty 2

## 2018-06-24 MED ORDER — DOCUSATE SODIUM 100 MG PO CAPS
100.0000 mg | ORAL_CAPSULE | Freq: Two times a day (BID) | ORAL | Status: DC
  Administered 2018-06-24 – 2018-06-29 (×10): 100 mg via ORAL
  Filled 2018-06-24 (×10): qty 1

## 2018-06-24 MED ORDER — KETOROLAC TROMETHAMINE 15 MG/ML IJ SOLN
15.0000 mg | Freq: Four times a day (QID) | INTRAMUSCULAR | Status: DC | PRN
  Administered 2018-06-24 – 2018-06-26 (×2): 15 mg via INTRAVENOUS
  Filled 2018-06-24 (×2): qty 1

## 2018-06-24 MED ORDER — INDIGOTINDISULFONATE SODIUM 8 MG/ML IJ SOLN
INTRAMUSCULAR | Status: AC
  Filled 2018-06-24: qty 5

## 2018-06-24 MED ORDER — PANTOPRAZOLE SODIUM 40 MG IV SOLR
40.0000 mg | INTRAVENOUS | Status: DC
  Administered 2018-06-24 – 2018-06-27 (×4): 40 mg via INTRAVENOUS
  Filled 2018-06-24 (×5): qty 40

## 2018-06-24 MED ORDER — INSULIN ASPART 100 UNIT/ML ~~LOC~~ SOLN
0.0000 [IU] | SUBCUTANEOUS | Status: DC
  Administered 2018-06-24 – 2018-06-25 (×2): 3 [IU] via SUBCUTANEOUS
  Administered 2018-06-25 – 2018-06-28 (×2): 2 [IU] via SUBCUTANEOUS

## 2018-06-24 MED ORDER — BELLADONNA ALKALOIDS-OPIUM 16.2-60 MG RE SUPP: 1.0000 | Freq: Three times a day (TID) | RECTAL | Status: DC | PRN

## 2018-06-24 MED ORDER — SODIUM CHLORIDE 0.45 % IV SOLN
INTRAVENOUS | Status: DC
  Administered 2018-06-24: 21:00:00 via INTRAVENOUS

## 2018-06-24 MED ORDER — ONDANSETRON HCL 4 MG/2ML IJ SOLN
4.0000 mg | Freq: Four times a day (QID) | INTRAMUSCULAR | Status: DC | PRN
  Administered 2018-06-24 – 2018-06-26 (×2): 4 mg via INTRAVENOUS
  Filled 2018-06-24: qty 2

## 2018-06-24 MED ORDER — TRAMADOL HCL 50 MG PO TABS
50.0000 mg | ORAL_TABLET | Freq: Four times a day (QID) | ORAL | Status: DC | PRN
  Administered 2018-06-25 – 2018-06-28 (×4): 50 mg via ORAL
  Filled 2018-06-24: qty 1
  Filled 2018-06-24 (×2): qty 2
  Filled 2018-06-24 (×3): qty 1

## 2018-06-24 MED ORDER — HYDRALAZINE HCL 20 MG/ML IJ SOLN: 5.0000 mg | INTRAMUSCULAR | Status: DC | PRN

## 2018-06-24 MED ORDER — PROPOFOL 10 MG/ML IV BOLUS
INTRAVENOUS | Status: DC | PRN
  Administered 2018-06-24: 30 mg via INTRAVENOUS
  Administered 2018-06-24: 40 mg via INTRAVENOUS
  Administered 2018-06-24: 50 mg via INTRAVENOUS
  Administered 2018-06-24: 150 mg via INTRAVENOUS

## 2018-06-24 MED ORDER — BUPIVACAINE-EPINEPHRINE (PF) 0.25% -1:200000 IJ SOLN
INTRAMUSCULAR | Status: AC
  Filled 2018-06-24: qty 30

## 2018-06-24 MED ORDER — DEXAMETHASONE SODIUM PHOSPHATE 10 MG/ML IJ SOLN
INTRAMUSCULAR | Status: AC
  Filled 2018-06-24: qty 1

## 2018-06-24 MED ORDER — BUPIVACAINE LIPOSOME 1.3 % IJ SUSP
20.0000 mL | Freq: Once | INTRAMUSCULAR | Status: AC
  Administered 2018-06-24: 20 mL
  Filled 2018-06-24: qty 20

## 2018-06-24 MED ORDER — SULFAMETHOXAZOLE-TRIMETHOPRIM 800-160 MG PO TABS
1.0000 | ORAL_TABLET | Freq: Two times a day (BID) | ORAL | Status: DC
  Administered 2018-06-24 – 2018-06-29 (×10): 1 via ORAL
  Filled 2018-06-24 (×10): qty 1

## 2018-06-24 MED ORDER — SODIUM CHLORIDE (PF) 0.9 % IJ SOLN
INTRAMUSCULAR | Status: AC
  Filled 2018-06-24: qty 50

## 2018-06-24 MED ORDER — OXYCODONE HCL 5 MG PO TABS: 5.0000 mg | ORAL_TABLET | Freq: Once | ORAL | Status: DC | PRN

## 2018-06-24 MED ORDER — HYDROMORPHONE HCL 2 MG/ML IJ SOLN
INTRAMUSCULAR | Status: AC
  Filled 2018-06-24: qty 1

## 2018-06-24 MED ORDER — ONDANSETRON HCL 4 MG/2ML IJ SOLN
INTRAMUSCULAR | Status: AC
  Administered 2018-06-24: 4 mg via INTRAVENOUS
  Filled 2018-06-24: qty 2

## 2018-06-24 MED ORDER — FENTANYL CITRATE (PF) 100 MCG/2ML IJ SOLN
25.0000 ug | INTRAMUSCULAR | Status: DC | PRN
  Administered 2018-06-24 (×2): 50 ug via INTRAVENOUS

## 2018-06-24 MED ORDER — SCOPOLAMINE 1 MG/3DAYS TD PT72
MEDICATED_PATCH | TRANSDERMAL | Status: DC | PRN
  Administered 2018-06-24: 1 via TRANSDERMAL

## 2018-06-24 MED ORDER — ACETAMINOPHEN 10 MG/ML IV SOLN
1000.0000 mg | Freq: Once | INTRAVENOUS | Status: AC
  Administered 2018-06-24: 1000 mg via INTRAVENOUS

## 2018-06-24 MED ORDER — ACETAMINOPHEN 10 MG/ML IV SOLN
INTRAVENOUS | Status: AC
  Filled 2018-06-24: qty 100

## 2018-06-24 MED ORDER — LACTATED RINGERS IV SOLN
INTRAVENOUS | Status: DC | PRN
  Administered 2018-06-24 (×3): via INTRAVENOUS

## 2018-06-24 MED ORDER — SCOPOLAMINE 1 MG/3DAYS TD PT72
MEDICATED_PATCH | TRANSDERMAL | Status: AC
  Filled 2018-06-24: qty 1

## 2018-06-24 MED ORDER — LACTATED RINGERS IV SOLN
INTRAVENOUS | Status: DC
  Administered 2018-06-24: 11:00:00 via INTRAVENOUS

## 2018-06-24 MED ORDER — BUPIVACAINE-EPINEPHRINE (PF) 0.25% -1:200000 IJ SOLN
INTRAMUSCULAR | Status: DC | PRN
  Administered 2018-06-24: 30 mL

## 2018-06-24 MED ORDER — FENTANYL CITRATE (PF) 100 MCG/2ML IJ SOLN
INTRAMUSCULAR | Status: AC
  Filled 2018-06-24: qty 4

## 2018-06-24 MED ORDER — PROPOFOL 10 MG/ML IV BOLUS
INTRAVENOUS | Status: AC
  Filled 2018-06-24: qty 20

## 2018-06-24 SURGICAL SUPPLY — 78 items
APPLICATOR COTTON TIP 6 STRL (MISCELLANEOUS) ×1 IMPLANT
APPLICATOR COTTON TIP 6IN STRL (MISCELLANEOUS) ×3
APPLICATOR SURGIFLO ENDO (HEMOSTASIS) IMPLANT
CHLORAPREP W/TINT 26ML (MISCELLANEOUS) ×3 IMPLANT
CLIP VESOLOCK LG 6/CT PURPLE (CLIP) ×6 IMPLANT
COVER SURGICAL LIGHT HANDLE (MISCELLANEOUS) ×3 IMPLANT
COVER TIP SHEARS 8 DVNC (MISCELLANEOUS) ×1 IMPLANT
COVER TIP SHEARS 8MM DA VINCI (MISCELLANEOUS) ×2
COVER WAND RF STERILE (DRAPES) ×3 IMPLANT
DECANTER SPIKE VIAL GLASS SM (MISCELLANEOUS) ×3 IMPLANT
DERMABOND ADVANCED (GAUZE/BANDAGES/DRESSINGS) ×2
DERMABOND ADVANCED .7 DNX12 (GAUZE/BANDAGES/DRESSINGS) ×1 IMPLANT
DRAIN CHANNEL RND F F (WOUND CARE) ×3 IMPLANT
DRAPE ARM DVNC X/XI (DISPOSABLE) ×4 IMPLANT
DRAPE COLUMN DVNC XI (DISPOSABLE) ×1 IMPLANT
DRAPE DA VINCI XI ARM (DISPOSABLE) ×8
DRAPE DA VINCI XI COLUMN (DISPOSABLE) ×2
ELECT REM PT RETURN 15FT ADLT (MISCELLANEOUS) ×3 IMPLANT
EVACUATOR SILICONE 100CC (DRAIN) ×3 IMPLANT
GAUZE SPONGE 4X4 16PLY XRAY LF (GAUZE/BANDAGES/DRESSINGS) ×3 IMPLANT
GLOVE BIO SURGEON STRL SZ 6.5 (GLOVE) ×2 IMPLANT
GLOVE BIO SURGEONS STRL SZ 6.5 (GLOVE) ×1
GLOVE BIOGEL M STRL SZ7.5 (GLOVE) ×6 IMPLANT
GOWN STRL REUS W/TWL LRG LVL3 (GOWN DISPOSABLE) ×6 IMPLANT
GUIDEWIRE STR DUAL SENSOR (WIRE) ×3 IMPLANT
HOLDER FOLEY CATH W/STRAP (MISCELLANEOUS) ×3 IMPLANT
IRRIG SUCT STRYKERFLOW 2 WTIP (MISCELLANEOUS) ×3
IRRIGATION SUCT STRKRFLW 2 WTP (MISCELLANEOUS) ×1 IMPLANT
LOOP VESSEL MAXI BLUE (MISCELLANEOUS) ×3 IMPLANT
NDL SAFETY ECLIPSE 18X1.5 (NEEDLE) ×1 IMPLANT
NEEDLE HYPO 18GX1.5 SHARP (NEEDLE) ×2
PACK ROBOT UROLOGY CUSTOM (CUSTOM PROCEDURE TRAY) ×3 IMPLANT
PAD POSITIONING PINK XL (MISCELLANEOUS) ×3 IMPLANT
PORT ACCESS TROCAR AIRSEAL 12 (TROCAR) ×1 IMPLANT
PORT ACCESS TROCAR AIRSEAL 5M (TROCAR) ×2
POUCH SPECIMEN RETRIEVAL 10MM (ENDOMECHANICALS) IMPLANT
SEAL CANN UNIV 5-8 DVNC XI (MISCELLANEOUS) ×4 IMPLANT
SEAL XI 5MM-8MM UNIVERSAL (MISCELLANEOUS) ×8
SET TRI-LUMEN FLTR TB AIRSEAL (TUBING) ×3 IMPLANT
SOLUTION ELECTROLUBE (MISCELLANEOUS) ×3 IMPLANT
SPONGE LAP 18X18 RF (DISPOSABLE) ×3 IMPLANT
STENT URET 6FRX22 CONTOUR (STENTS) ×6 IMPLANT
SURGIFLO W/THROMBIN 8M KIT (HEMOSTASIS) IMPLANT
SUT ETHILON 3 0 PS 1 (SUTURE) ×3 IMPLANT
SUT MNCRL 3 0 VIOLET RB1 (SUTURE) IMPLANT
SUT MNCRL AB 4-0 PS2 18 (SUTURE) ×6 IMPLANT
SUT MON AB 2-0 SH 27 (SUTURE)
SUT MON AB 2-0 SH27 (SUTURE) IMPLANT
SUT MONOCRYL 3 0 RB1 (SUTURE)
SUT PDS AB 1 CTX 36 (SUTURE) IMPLANT
SUT PDS AB 3-0 SH 27 (SUTURE) IMPLANT
SUT PDS AB 4-0 RB1 27 (SUTURE) IMPLANT
SUT PDS AB 4-0 SH 27 (SUTURE) IMPLANT
SUT PROLENE 2 0 SH DA (SUTURE) IMPLANT
SUT SILK 0 (SUTURE)
SUT SILK 0 30XBRD TIE 6 (SUTURE) IMPLANT
SUT SILK 2 0 (SUTURE)
SUT SILK 2 0 SH CR/8 (SUTURE) IMPLANT
SUT SILK 2-0 30XBRD TIE 12 (SUTURE) IMPLANT
SUT SILK 3 0 (SUTURE)
SUT SILK 3 0 12 30 (SUTURE) IMPLANT
SUT SILK 3-0 18XBRD TIE 12 (SUTURE) IMPLANT
SUT V-LOC BARB 180 2/0GR6 GS22 (SUTURE) ×3
SUT VIC AB 0 CT1 27 (SUTURE) ×2
SUT VIC AB 0 CT1 27XBRD ANTBC (SUTURE) ×1 IMPLANT
SUT VIC AB 2-0 SH 27 (SUTURE)
SUT VIC AB 2-0 SH 27X BRD (SUTURE) IMPLANT
SUT VIC AB 3-0 SH 27 (SUTURE) ×10
SUT VIC AB 3-0 SH 27X BRD (SUTURE) ×5 IMPLANT
SUT VIC AB 4-0 RB1 27 (SUTURE)
SUT VIC AB 4-0 RB1 27XBRD (SUTURE) IMPLANT
SUT VIC AB 4-0 SH 18 (SUTURE) IMPLANT
SUT VIC AB 4-0 SH 27 (SUTURE) ×8
SUT VIC AB 4-0 SH 27XBRD (SUTURE) ×4 IMPLANT
SUT VICRYL 0 UR6 27IN ABS (SUTURE) ×6 IMPLANT
SUTURE V-LC BRB 180 2/0GR6GS22 (SUTURE) ×1 IMPLANT
WATER STERILE IRR 1000ML POUR (IV SOLUTION) ×3 IMPLANT
YANKAUER SUCT BULB TIP 10FT TU (MISCELLANEOUS) IMPLANT

## 2018-06-24 NOTE — Op Note (Signed)
Preoperative diagnosis:  1. Bilateral ureteral vaginal fistula  Postoperative diagnosis:  1. Same  Procedure: 1. Robotic assisted laparoscopic bilateral ureteral reimplant  Surgeon: Ardis Hughs, MD First Assistant: Dr. [name], MD  Anesthesia: General  Complications: None  Intraoperative findings:  #1: The patient's ureters were thickened, and adherent to the sidewalls, but there were able to be reimplanted without any tension. #2: The patient had a mid urethral sling mesh noted around the suprapubic bone that was unmolested.  EBL: Minimal  Specimens: None  Indication: Amy Morrow is a 57 y.o. 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  After reviewing the management options for treatment, he elected to proceed with the above surgical procedure(s). We have discussed the potential benefits and risks of the procedure, side effects of the proposed treatment, the likelihood of the patient achieving the goals of the procedure, and any potential problems that might occur during the procedure or recuperation. Informed consent has been obtained.  Description of procedure:  The patient was taken to the operating room and general anesthesia was induced.  The patient was placed in the dorsal lithotomy position, prepped and draped in the usual sterile fashion, and preoperative antibiotics were administered. A preoperative time-out was performed.   The patient was consented in the preoperative holding area. He was in brought back to the operating room placed the table in supine position. General anesthesia was then induced and endotracheal tube was inserted. He was then placed in dorsolithotomy position and placed in steep Trendelenburg. He was then prepped and draped in the routine sterile fashion. We, the first assistant and I, then began by making a 10 mm incision supraumbilical midline incision the skin  down through into the peritoneum. Then placed a 8 mm trocar. I then inflated the abdomen and inserted the 0 robotic lens. We then placed 2 additional a 8 millimeter trochars in the patient's left lower abdomen proximally 9 cm apart and 2 trochars on the patient's right lower abdomen, one was an 8 mm trocar and the one most lateral was a 12 mm trocar which was used as the assistant port. A 5 mm trocar was placed by triangulating the 2 right lateral ports as a second assistant port. These ports were all placed under visual guidance. Once the ports were noted to be satisfactory position the robot was docked. We started with the 0 lens, monopolar scissors in the right hand and the [place] forceps the left hand as well as a fenestrated grasper as the third arm on the left-hand side.   I first started by removing the adhesions of the omentum along the sidewall and the bladder.  Once all the adhesions were out of the pelvis I started by dissecting out the left ureter which was easily visualized but adherent to the left sidewall.  I dissected as distal as possible, did see a opening of the very distal ureter where the stent was visible.  I dissected the ureter back to the pelvic inlet.  I did the same thing on the patient's right ureter.  Once the ureters had been dissected to the pelvic inlet bilaterally I put clips on the distal aspect of the ureters prior to ligating the ureters.  The stents were removed from each ureter at this time.  The patient's left bladder and ureter were closed with a 2-0V lock to ensure no bladder leak at the area where the ureter had a residual opening  from her initial injury.  I then dropped to the bladder.  At this point I did see that the patient had a mesh sling which was intact.  I dissected around it and did not manipulate the mesh.  I then filled the bladder up with 175 cc of normal saline.  This allowed me to line up the ureter with the dome of the bladder.  I placed a 3-0 Vicryl  tacking suture through the adventitia of the ureter and secured it to the bladder.  I then placed 2 holding sutures along the bladder and performed a cystotomy in between the 2 sutures.  I performed a ureteral vesicle anastomosis beginning at the crotch of the spatulated ureter using a 4-0 Vicryl and running the suture up to the apex on both sides and then tying it off.  Prior to completing the vesicoureteral anastomosis I placed a 22 cm x 6 French double-J ureteral stent.  This was placed in the retrograde fashion using a sensor wire to guide stent into the appropriate position.  Once the stent was all the way up in the wire removed the stent effluxed urine indicating it was in the renal pelvis.  I then stuffed at the end of the stent into the bladder and then finished the anastomosis.  I then closed the second layer of the bladder to imbricate the ureterovesical anastomosis and reinforce it.  Next I performed a right-sided ureterovesical anastomosis in the exact same fashion.  A stent was placed, 22 cm x 6 French double-J, and the exact same fashion.  The anastomosis was reinforced by closing the bladder and second layer as performed on the left side.  I then refilled the bladder with 150 cc of normal saline to ensure that there were no leaks.  I then removed the far left robotic arm and placed a 19 [place] Blake drain through the port and into the pelvis overlying the new bilateral vesicoureteral anastomoses.  I removed the port and secured the drain to the skin with a 3-0 nylon.  The 59 [place] assistant port in the right lateral side was then removed and using a suture passer laparoscopically the fascia was closed with a 0-0 Vicryl.  All the remaining ports were then removed and the robot was undocked.  A combination of Exparel and the quarter percent Marcaine mixture was then used to inject the skin around all the incisions.  The skin was then closed with 4-0 Monocryl in a subcuticular fashion.  The  patient's Foley was then placed to gravity drainage.  The patient was subsequently extubated and returned to the PACU in stable condition.  At the end of the case all laps needles and sponges had been accounted for.  Ardis Hughs, M.D.

## 2018-06-24 NOTE — H&P (Signed)
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   The patient underwent a radical hysterectomy and pelvic lymphadenectomy around 02/09/18 for cervical cancer. Her postoperative course was largely unremarkable, however she then developed dehiscence of her vaginal cuff and significant drainage. She was ultimately noted to have bilateral ureteral injuries. She was taken to the operating room on 02/16/18 for bilateral stent placement. At the time, the distal aspect of both ureters was noted to be ischemic. Following stent placement she continued to have severe leakage. Ultimately, 5 or 6 days ago she had bilateral nephrostomy tubes placed. Since that time she notes that she has had significant improvement in her leakage. She was mostly dry, but did have some leakage this morning. Otherwise, she is starting to dry out, things are improving for her. Her Foley catheter has been removed.   The patient the moment is not complaining of any dysuria, flank pain, or fever/chills.   9/19: Underwent bilateral ureteroscopy, retrograde pyelograms and stent exchange. Stent tether from right stent protruding through vaginal cuff. ureteroscopy demonstrated no significant abnormality on the right, right retrograde was normal. Left retrograde demonstrated small abnormality in the intramural ureter, confirmed by the retrograde demonstrating a small abnormality.   05/28/18: Patient was taken back to the operating room and repeat retrograde pyelograms were performed bilaterally. This demonstrated persistent vaginal ureteral fistulas. These were bilateral. The patient's right Polaris stent had migrated down into the patient's vagina. There was substantial leakage of the contrast from the ureter into the vagina bilaterally. Her stents were replaced and her nephrostomy tubes were removed.   Intv: Seen today pre-op for bilateral ureteral reimplant. Since the patient had her stent exchange surgery  and her nephrostomy tubes were removed she's been doing well. Having the nephrostomy tubes out she is sleeping much better and overall feels stronger. She continues to have some leakage, predominantly through her vagina. She also does have some stress urinary incontinence. She denies any dysuria or gross hematuria. She seems to be tolerating her stents without issue.     ALLERGIES: No Allergies    MEDICATIONS: Metformin Hcl  Nitrofurantoin     GU PSH: Cystoscopy Insert Stent, Bilateral - 05/28/2018, Bilateral - 04/22/2018, Bilateral - 02/16/2018 Cystoscopy Ureteroscopy, Bilateral - 05/28/2018, Left - 04/22/2018, Right - 02/16/2018      PSH Notes: Preventive medication therapy needed, Bladder Surgery   NON-GU PSH: None   GU PMH: Fistula Female genital tract, Unspec - 03/22/2018 Other microscopic hematuria, Microscopic hematuria - 2014 Urge incontinence, Urge incontinence of urine - 2014 Urinary Frequency, Increased urinary frequency - 2014 Urinary Urgency, Urinary urgency - 2014      PMH Notes:  1898-08-04 00:00:00 - Note: Normal Routine History And Physical Adult      pre-diabetic 2019   NON-GU PMH: Anxiety, Anxiety (Symptom) - 2014 Personal history of other diseases of the nervous system and sense organs, History of sleep apnea - 2014    FAMILY HISTORY: Family Health Status - Father alive at age 73 - 74 In Family Family Health Status - Mother's Age - Mother Family Health Status Number - Runs In Family nephrolithiasis - Mother   SOCIAL HISTORY: Marital Status: Married Preferred Language: Spanish; Education officer, community; Ethnicity: Not Hispanic Or Latino; Race: Other Race Current Smoking Status: Patient has never smoked.   Tobacco Use Assessment Completed: Used Tobacco in last 30 days? Does not drink anymore.  Drinks 1 caffeinated drink per day.     Notes: Tobacco Use, Alcohol Use, Caffeine  Use, Occupation: Agricultural engineer, Marital History - Currently Married   REVIEW OF SYSTEMS:    GU  Review Female:   Patient denies being pregnant, burning /pain with urination, hard to postpone urination, stream starts and stops, frequent urination, have to strain to urinate, leakage of urine, get up at night to urinate, and trouble starting your stream.  Gastrointestinal (Upper):   Patient denies nausea, vomiting, and indigestion/ heartburn.  Gastrointestinal (Lower):   Patient denies diarrhea and constipation.  Constitutional:   Patient denies fever, night sweats, weight loss, and fatigue.  Skin:   Patient denies skin rash/ lesion and itching.  Eyes:   Patient denies blurred vision and double vision.  Ears/ Nose/ Throat:   Patient denies sore throat and sinus problems.  Hematologic/Lymphatic:   Patient denies swollen glands and easy bruising.  Cardiovascular:   Patient denies leg swelling and chest pains.  Respiratory:   Patient denies cough and shortness of breath.  Endocrine:   Patient denies excessive thirst.  Musculoskeletal:   Patient denies back pain and joint pain.  Neurological:   Patient denies headaches and dizziness.  Psychologic:   Patient denies depression and anxiety.   VITAL SIGNS:      06/08/2018 12:50 PM  Weight 140 lb / 63.5 kg  Height 59 in / 149.86 cm  BP 145/88 mmHg  Pulse 90 /min  Temperature 98.5 F / 36.9 C  BMI 28.3 kg/m   MULTI-SYSTEM PHYSICAL EXAMINATION:    Constitutional: Well-nourished. No physical deformities. Normally developed. Good grooming.  Respiratory: Normal breath sounds. No labored breathing, no use of accessory muscles.   Cardiovascular: Regular rate and rhythm. No murmur, no gallop. Normal temperature, normal extremity pulses, no swelling, no varicosities.   Gastrointestinal: No mass, no tenderness, no rigidity, non obese abdomen. The patient's nephrostomy tube sites are well-healed.  Eyes: Normal conjunctivae. Normal eyelids.     PAST DATA REVIEWED:  Source Of History:  Patient  Records Review:   Previous Doctor Records, Previous  Patient Records, POC Tool  X-Ray Review: C.T. Abdomen/Pelvis: Reviewed Films. Discussed With Patient.     PROCEDURES:          Urinalysis w/Scope Dipstick Dipstick Cont'd Micro  Color: Yellow Bilirubin: Neg mg/dL WBC/hpf: >60/hpf  Appearance: Cloudy Ketones: Neg mg/dL RBC/hpf: 10 - 20/hpf  Specific Gravity: 1.025 Blood: 3+ ery/uL Bacteria: Few (10-25/hpf)  pH: 6.0 Protein: 2+ mg/dL Cystals: NS (Not Seen)  Glucose: Neg mg/dL Urobilinogen: 0.2 mg/dL Casts: NS (Not Seen)    Nitrites: Neg Trichomonas: Not Present    Leukocyte Esterase: 3+ leu/uL Mucous: Present      Epithelial Cells: 0 - 5/hpf      Yeast: NS (Not Seen)      Sperm: Not Present    Notes: microscopic not concentrated    ASSESSMENT:      ICD-10 Details  1 GU:   Fistula Female genital tract, Unspec - N82.9    PLAN:           Orders Labs Urine Culture          Document Letter(s):  Created for Patient: Clinical Summary         Notes:   The patient has bilateral vaginal ureteral fistulas, albeit small. We were unable to manage this conservatively and after proximally 3 months of conservative management we have opted to proceed to the operating room for robotic-assisted bilateral ureteral reimplant. I spent a considerable amount of time with the patient and her son discussing the surgery. We  discussed the procedure itself as well as the recovery. She understands that she will require Foley catheter for 7-10 days after surgery. She also understands that she will have stents in her ureters for proximally 3-4 weeks after surgery. These will all be removed in clinic. She will likely be in the hospital 1-2 days, and should expect at least a 6 week recovery process. I discussed the risks and the benefits of the operation. I answered all her questions. The patient is already been scheduled for approximately 2 weeks from today.

## 2018-06-24 NOTE — Anesthesia Preprocedure Evaluation (Signed)
Anesthesia Evaluation  Patient identified by MRN, date of birth, ID band Patient awake    Reviewed: Allergy & Precautions, NPO status , Patient's Chart, lab work & pertinent test results  History of Anesthesia Complications Negative for: history of anesthetic complications  Airway Mallampati: II  TM Distance: >3 FB Neck ROM: Full    Dental no notable dental hx.    Pulmonary neg pulmonary ROS,    Pulmonary exam normal        Cardiovascular negative cardio ROS Normal cardiovascular exam     Neuro/Psych negative neurological ROS  negative psych ROS   GI/Hepatic negative GI ROS, Neg liver ROS,   Endo/Other  diabetes (pre-diabetic on metformin)  Renal/GU negative Renal ROS  negative genitourinary   Musculoskeletal negative musculoskeletal ROS (+)   Abdominal   Peds  Hematology negative hematology ROS (+)   Anesthesia Other Findings   Reproductive/Obstetrics                             Anesthesia Physical Anesthesia Plan  ASA: II  Anesthesia Plan: General   Post-op Pain Management:    Induction: Intravenous  PONV Risk Score and Plan: 4 or greater and Ondansetron, Dexamethasone, Midazolam, Scopolamine patch - Pre-op and Treatment may vary due to age or medical condition  Airway Management Planned: Oral ETT  Additional Equipment: None  Intra-op Plan:   Post-operative Plan: Extubation in OR  Informed Consent: I have reviewed the patients History and Physical, chart, labs and discussed the procedure including the risks, benefits and alternatives for the proposed anesthesia with the patient or authorized representative who has indicated his/her understanding and acceptance.     Plan Discussed with:   Anesthesia Plan Comments:         Anesthesia Quick Evaluation

## 2018-06-24 NOTE — Anesthesia Postprocedure Evaluation (Signed)
Anesthesia Post Note  Patient: Amy Morrow  Procedure(s) Performed: XI ROBOTICALLY ASSISTED LAPAROSCOPIC URETERAL RE-IMPLANTATION (Bilateral )     Patient location during evaluation: PACU Anesthesia Type: General Level of consciousness: awake and alert Pain management: pain level controlled Vital Signs Assessment: post-procedure vital signs reviewed and stable Respiratory status: spontaneous breathing, nonlabored ventilation and respiratory function stable Cardiovascular status: blood pressure returned to baseline and stable Postop Assessment: no apparent nausea or vomiting Anesthetic complications: no    Last Vitals:  Vitals:   06/24/18 1915 06/24/18 1937  BP: 116/69 114/68  Pulse: 84 72  Resp: 15 14  Temp: 36.9 C 36.7 C  SpO2: 95% 100%    Last Pain:  Vitals:   06/24/18 2105  TempSrc:   PainSc: [place]

## 2018-06-24 NOTE — Transfer of Care (Signed)
Immediate Anesthesia Transfer of Care Note  Patient: Amy Morrow  Procedure(s) Performed: XI ROBOTICALLY ASSISTED LAPAROSCOPIC URETERAL RE-IMPLANTATION (Bilateral )  Patient Location: PACU  Anesthesia Type:General  Level of Consciousness: awake, alert , oriented and patient cooperative  Airway & Oxygen Therapy: Patient Spontanous Breathing and Patient connected to face mask oxygen  Post-op Assessment: Report given to RN, Post -op Vital signs reviewed and stable and Patient moving all extremities X 4  Post vital signs: stable  Last Vitals:  Vitals Value Taken Time  BP 121/82 06/24/2018  6:18 PM  Temp 36.5 C 06/24/2018  6:18 PM  Pulse 93 06/24/2018  6:25 PM  Resp 8 06/24/2018  6:25 PM  SpO2 100 % 06/24/2018  6:25 PM  Vitals shown include unvalidated device data.  Last Pain:  Vitals:   06/24/18 1008  TempSrc: Oral         Complications: No apparent anesthesia complications

## 2018-06-24 NOTE — Anesthesia Procedure Notes (Signed)
Procedure Name: Intubation Date/Time: 06/24/2018 2:10 PM Performed by: Lavina Hamman, CRNA Pre-anesthesia Checklist: Patient identified, Emergency Drugs available, Suction available, Patient being monitored and Timeout performed Patient Re-evaluated:Patient Re-evaluated prior to induction Oxygen Delivery Method: Circle system utilized Preoxygenation: Pre-oxygenation with 100% oxygen Induction Type: IV induction Ventilation: Mask ventilation without difficulty Laryngoscope Size: Mac and 3 Grade View: Grade I Tube type: Oral Tube size: 7.0 mm Number of attempts: 1 Airway Equipment and Method: Stylet Placement Confirmation: ETT inserted through vocal cords under direct vision,  positive ETCO2,  CO2 detector and breath sounds checked- equal and bilateral Secured at: 21 cm Tube secured with: Tape Dental Injury: Teeth and Oropharynx as per pre-operative assessment

## 2018-06-25 ENCOUNTER — Other Ambulatory Visit: Payer: Self-pay

## 2018-06-25 LAB — GLUCOSE, CAPILLARY
GLUCOSE-CAPILLARY: 104 mg/dL — AB (ref 70–99)
GLUCOSE-CAPILLARY: 108 mg/dL — AB (ref 70–99)
GLUCOSE-CAPILLARY: 109 mg/dL — AB (ref 70–99)
GLUCOSE-CAPILLARY: 110 mg/dL — AB (ref 70–99)
GLUCOSE-CAPILLARY: 123 mg/dL — AB (ref 70–99)
GLUCOSE-CAPILLARY: 157 mg/dL — AB (ref 70–99)

## 2018-06-25 MED ORDER — SULFAMETHOXAZOLE-TRIMETHOPRIM 800-160 MG PO TABS: 1.0000 | ORAL_TABLET | Freq: Two times a day (BID) | ORAL | 0 refills | Status: DC

## 2018-06-25 MED ORDER — ACETAMINOPHEN 500 MG PO TABS: 1000.0000 mg | ORAL_TABLET | Freq: Four times a day (QID) | ORAL | 0 refills | Status: DC | PRN

## 2018-06-25 MED ORDER — DOCUSATE SODIUM 100 MG PO CAPS: 100.0000 mg | ORAL_CAPSULE | Freq: Two times a day (BID) | ORAL | 0 refills | Status: DC | PRN

## 2018-06-25 MED ORDER — SALINE SPRAY 0.65 % NA SOLN
1.0000 | NASAL | Status: DC | PRN
  Filled 2018-06-25: qty 44

## 2018-06-25 MED ORDER — TRAMADOL HCL 50 MG PO TABS: 50.0000 mg | ORAL_TABLET | Freq: Four times a day (QID) | ORAL | 0 refills | Status: DC | PRN

## 2018-06-25 NOTE — Progress Notes (Signed)
Urology Inpatient Progress Report  BILATERAL URETEROVAGINAL FISTULA  Procedure(s): XI ROBOTICALLY ASSISTED LAPAROSCOPIC URETERAL RE-IMPLANTATION  1 Day Post-Op   Intv/Subj: No acute events overnight. Patient is without complaint.  Active Problems:   Right ureteral injury  Current Facility-Administered Medications  Medication Dose Route Frequency Provider Last Rate Last Dose  . acetaminophen (OFIRMEV) IV 1,000 mg  1,000 mg Intravenous Q6H Ardis Hughs, MD 400 mL/hr at 06/25/18 0531 1,000 mg at 06/25/18 0531  . docusate sodium (COLACE) capsule 100 mg  100 mg Oral BID Ardis Hughs, MD   100 mg at 06/24/18 2125  . hydrALAZINE (APRESOLINE) injection 5 mg  5 mg Intravenous Q4H PRN Ardis Hughs, MD      . insulin aspart (novoLOG) injection 0-15 Units  0-15 Units Subcutaneous Q4H Ardis Hughs, MD   2 Units at 06/25/18 0045  . ketorolac (TORADOL) 15 MG/ML injection 15 mg  15 mg Intravenous Q6H PRN Ardis Hughs, MD   15 mg at 06/24/18 2035  . lactated ringers infusion   Intravenous Continuous Lidia Collum, MD      . morphine 2 MG/ML injection 2 mg  2 mg Intravenous Q1H PRN Ardis Hughs, MD   2 mg at 06/25/18 0215  . ondansetron (ZOFRAN) injection 4 mg  4 mg Intravenous Q6H PRN Ardis Hughs, MD   4 mg at 06/24/18 2033  . opium-belladonna (B&O SUPPRETTES) 16.2-60 MG suppository 1 suppository  1 suppository Rectal Q8H PRN Ardis Hughs, MD      . pantoprazole (PROTONIX) injection 40 mg  40 mg Intravenous Q24H Ardis Hughs, MD   40 mg at 06/24/18 2126  . sulfamethoxazole-trimethoprim (BACTRIM DS,SEPTRA DS) 800-160 MG per tablet 1 tablet  1 tablet Oral Q12H Ardis Hughs, MD   1 tablet at 06/24/18 2125  . traMADol (ULTRAM) tablet 50-100 mg  50-100 mg Oral Q6H PRN Ardis Hughs, MD         Objective: Vital: Vitals:   06/24/18 1915 06/24/18 1937 06/25/18 0021 06/25/18 0434  BP: 116/69 114/68 107/62 (!) 94/59  Pulse:  84 72 72 69  Resp: 15 14 12 12   Temp: 98.4 F (36.9 C) 98 F (36.7 C) 97.8 F (36.6 C) 98 F (36.7 C)  TempSrc:  Oral Oral Oral  SpO2: 95% 100% 100% 99%  Weight:      Height:       I/Os: I/O last 3 completed shifts: In: 4010.6 [P.O.:240; I.V.:3670.6; IV Piggyback:100] Out: 2265 [Urine:2125; Drains:40; Blood:100]  Physical Exam:  General: Patient is in no apparent distress Lungs: Normal respiratory effort, chest expands symmetrically. GI: Incisions are c/d/i.  The abdomen is soft and nontender without mass. JP drain with serosanguinous drainage Foley: clear yellow urine Ext: lower extremities symmetric  Lab Results: Recent Labs    06/24/18 2020  WBC 13.0*  HGB 9.8*  HCT 30.9*   Recent Labs    06/24/18 2020  NA 137  K 3.5  CL 103  CO2 25  GLUCOSE 185*  BUN 11  CREATININE 0.60  CALCIUM 8.2*   No results for input(s): LABPT, INR in the last 72 hours. No results for input(s): LABURIN in the last 72 hours. Results for orders placed or performed during the hospital encounter of 06/16/18  Urine culture     Status: Abnormal   Collection Time: 06/16/18  3:16 PM  Result Value Ref Range Status   Specimen Description   Final    URINE, CLEAN  CATCH Performed at [facility], [place] [address], [place], [place] [address]    Special Requests   Final    NONE Performed at [facility], [place] [address] [place], [place] [address]    Culture (A)  Final    >=100,000 COLONIES/mL GAMMA HEMOLYSIS Standardized susceptibility testing for this organism is not available. 30,000 COLONIES/mL YEAST    Report Status 06/19/2018 FINAL  Final    Studies/Results: No results found.  Assessment: Procedure(s): XI ROBOTICALLY ASSISTED LAPAROSCOPIC URETERAL RE-IMPLANTATION, 1 Day Post-Op  doing well.  Plan: D/c drain HLIVF ADAT Sliding scale insulin Continue Bactrim Ambulate Transition to oral pain meds D/C tomorrow AM  Louis Meckel,  MD Urology 06/25/2018, 7:33 AM

## 2018-06-25 NOTE — Progress Notes (Signed)
Patient alert, oriented, in no acute distress, resting in bed.  Complaining of sinus pressure and sinus headache.  Reports intermittent pain in her abdomen with movement.  Tolerating solid food with no nausea or emesis.  No concerns voiced except for needing something for a sinus headache. Plan for discharge tomorrow.  Foley with light tea colored urine.

## 2018-06-25 NOTE — Discharge Instructions (Signed)
Activity:  You are encouraged to ambulate frequently (about every hour during waking hours) to help prevent blood clots from forming in your legs or lungs.  However, you should not engage in any heavy lifting (> 10-15 lbs), strenuous activity, or straining.   Diet: You should advance your diet as instructed by your physician.  It will be normal to have some bloating, nausea, and abdominal discomfort intermittently.   Prescriptions:  You will be provided a prescription for pain medication to take as needed.  If your pain is not severe enough to require the prescription pain medication, you may take extra strength Tylenol instead which will have less side effects.  You should also take a prescribed stool softener to avoid straining with bowel movements as the prescription pain medication may constipate you.   Incisions: You may remove your dressing bandages 48 hours after surgery if not removed in the hospital.  You will either have some small staples or special tissue glue at each of the incision sites. Once the bandages are removed (if present), the incisions may stay open to air.  You may start showering (but not soaking or bathing in water) the 2nd day after surgery and the incisions simply need to be patted dry after the shower.  No additional care is needed.   What to call us about: You should call the office [phone]) if you develop fever > 101 or develop persistent vomiting. Activity:  You are encouraged to ambulate frequently (about every hour during waking hours) to help prevent blood clots from forming in your legs or lungs.  However, you should not engage in any heavy lifting (> 10-15 lbs), strenuous activity, or straining.   Foley Catheter Care, Adult A soft, flexible tube (Foley catheter) has been placed in your bladder. This may be done to temporarily help with urine drainage after an operation or to relieve blockage from an enlarged prostate gland. HOME CARE INSTRUCTIONS  If  you are going home with a Foley catheter in place, follow these instructions: Taking Care of the Catheter:  Keep the area where the catheter leaves your body clean.  Attach the catheter to the leg so there is no tension on the catheter.  Keep the drainage bag below the level of the bladder, but keep it OFF the floor.  Do not take long soaking baths.   Wash your hands before touching ANYTHING related to the catheter or bag.  Using mild soap and warm water on a washcloth:  Clean the area closest to the catheter insertion site using a circular motion around the catheter.  Clean the catheter itself by wiping AWAY from the insertion site for several inches down the tube.  NEVER wipe upward as this could sweep bacteria up into the urethra (tube in your body that normally drains the bladder) and cause infection. Taking Care of the Drainage Bags:  Two drainage bags will be taken home: a large overnight drainage bag, and a smaller leg bag which fits underneath clothing.  It is okay to wear the overnight bag at any time, but NEVER wear the smaller leg bag at night.  Keep the drainage bag well below the level of your bladder. This prevents backflow of urine into the bladder and allows the urine to drain freely.  Anchor the tubing to your leg to prevent pulling or tension on the catheter. Use tape or a leg strap provided by the hospital.  Empty the drainage bag when it is  to  full. Wash your hands before and after touching the bag.  Periodically check the tubing for kinks to make sure there is no pressure on the tubing which could restrict the flow of urine. Changing the Drainage Bags:  Cleanse both ends of the clean bag with alcohol before changing.  Pinch off the rubber catheter to avoid urine spillage during the disconnection.  Disconnect the dirty bag and connect the clean one.  Empty the dirty bag carefully to avoid a urine spill.  Attach the new bag to the leg with tape or a  leg strap. Cleaning the Drainage Bags:  Whenever a drainage bag is disconnected, it must be cleaned quickly so it is ready for the next use.  Wash the bag in warm, soapy water.  Rinse the bag thoroughly with warm water.  Soak the bag for 30 minutes in a solution of white vinegar and water (1 cup vinegar to 1 quart warm water).  Rinse with warm water. SEEK MEDICAL CARE IF:   Some pain develops in the kidney (lower back) area.  The urine is cloudy or smells bad.  There is some blood in the urine.  The catheter becomes clogged and/or there is no urine drainage. SEEK IMMEDIATE MEDICAL CARE IF:   You have moderate or severe pain in the kidney region.  You start to throw up (vomit).  Blood fills the tube.  Worsening belly (abdominal) pain develops.  You have a fever. MAKE SURE YOU:   Understand these instructions.  Will watch your condition.  Will get help right away if you are not doing well or get worse.  Call [facility] if you have any questions or concerns: [phone]

## 2018-06-26 ENCOUNTER — Inpatient Hospital Stay (HOSPITAL_COMMUNITY): Payer: Medicaid Other

## 2018-06-26 LAB — CBC
HCT: 29.1 % — ABNORMAL LOW (ref 36.0–46.0)
Hemoglobin: 9.3 g/dL — ABNORMAL LOW (ref 12.0–15.0)
MCH: 27.8 pg (ref 26.0–34.0)
MCHC: 32 g/dL (ref 30.0–36.0)
MCV: 87.1 fL (ref 80.0–100.0)
PLATELETS: 307 10*3/uL (ref 150–400)
RBC: 3.34 MIL/uL — ABNORMAL LOW (ref 3.87–5.11)
RDW: 15 % (ref 11.5–15.5)
WBC: 14 10*3/uL — ABNORMAL HIGH (ref 4.0–10.5)
nRBC: 0 % (ref 0.0–0.2)

## 2018-06-26 LAB — BASIC METABOLIC PANEL
Anion gap: 9 (ref 5–15)
BUN: 11 mg/dL (ref 6–20)
CHLORIDE: 102 mmol/L (ref 98–111)
CO2: 26 mmol/L (ref 22–32)
CREATININE: 0.75 mg/dL (ref 0.44–1.00)
Calcium: 8.3 mg/dL — ABNORMAL LOW (ref 8.9–10.3)
Glucose, Bld: 110 mg/dL — ABNORMAL HIGH (ref 70–99)
POTASSIUM: 3.5 mmol/L (ref 3.5–5.1)
SODIUM: 137 mmol/L (ref 135–145)

## 2018-06-26 LAB — GLUCOSE, CAPILLARY
GLUCOSE-CAPILLARY: 90 mg/dL (ref 70–99)
GLUCOSE-CAPILLARY: 114 mg/dL — AB (ref 70–99)
Glucose-Capillary: 84 mg/dL (ref 70–99)
Glucose-Capillary: 112 mg/dL — ABNORMAL HIGH (ref 70–99)

## 2018-06-26 LAB — URINALYSIS, ROUTINE W REFLEX MICROSCOPIC
BILIRUBIN URINE: NEGATIVE
Bacteria, UA: NONE SEEN
GLUCOSE, UA: NEGATIVE mg/dL
Ketones, ur: 5 mg/dL — AB
NITRITE: NEGATIVE
Protein, ur: 100 mg/dL — AB
RBC / HPF: >50 RBC/hpf — ABNORMAL HIGH (ref 0–5)
SPECIFIC GRAVITY, URINE: 1.019 (ref 1.005–1.030)
WBC, UA: >50 WBC/hpf — ABNORMAL HIGH (ref 0–5)
pH: 6 (ref 5.0–8.0)

## 2018-06-26 LAB — PROCALCITONIN: Procalcitonin: 0.22 ng/mL

## 2018-06-26 LAB — APTT: APTT: 38 s — AB (ref 24–36)

## 2018-06-26 LAB — LACTIC ACID, PLASMA: Lactic Acid, Venous: 0.7 mmol/L (ref 0.5–1.9)

## 2018-06-26 LAB — PROTIME-INR
INR: 1.15
Prothrombin Time: 14.6 seconds (ref 11.4–15.2)

## 2018-06-26 MED ORDER — SODIUM CHLORIDE 0.9 % IV SOLN
2.0000 g | Freq: Two times a day (BID) | INTRAVENOUS | Status: DC
  Administered 2018-06-26 – 2018-06-27 (×3): 2 g via INTRAVENOUS
  Filled 2018-06-26 (×4): qty 2

## 2018-06-26 MED ORDER — ACETAMINOPHEN 500 MG PO TABS
1000.0000 mg | ORAL_TABLET | Freq: Four times a day (QID) | ORAL | Status: DC | PRN
  Administered 2018-06-26 – 2018-06-27 (×3): 1000 mg via ORAL
  Filled 2018-06-26 (×3): qty 2

## 2018-06-26 MED ORDER — FLUCONAZOLE IN SODIUM CHLORIDE 200-0.9 MG/100ML-% IV SOLN
200.0000 mg | INTRAVENOUS | Status: DC
  Administered 2018-06-26 – 2018-06-27 (×2): 200 mg via INTRAVENOUS
  Filled 2018-06-26 (×2): qty 100

## 2018-06-26 MED ORDER — HEPARIN SODIUM (PORCINE) 5000 UNIT/ML IJ SOLN
5000.0000 [IU] | Freq: Three times a day (TID) | INTRAMUSCULAR | Status: DC
  Administered 2018-06-26 – 2018-06-29 (×9): 5000 [IU] via SUBCUTANEOUS
  Filled 2018-06-26 (×9): qty 1

## 2018-06-26 MED ORDER — SODIUM CHLORIDE 0.9 % IV SOLN
INTRAVENOUS | Status: DC
  Administered 2018-06-26 – 2018-06-27 (×2): via INTRAVENOUS

## 2018-06-26 MED ORDER — DEXTROSE 5 % IV SOLN: 500.0000 mg | Freq: Two times a day (BID) | INTRAVENOUS | Status: DC

## 2018-06-26 NOTE — Progress Notes (Signed)
Pharmacy Brief Note:  Cefepime dose adjustment (per protocol):  Cefepime 500 mg IV q12h initially ordered for UTI. After reviewing past cultures, patient had a pseudomonas UTI in October 2019. Given micro history and renal function (CrCl 65 mL/min), will increase dose to cefepime 2 g IV q12h.  Lenis Noon, PharmD 06/26/18 4:42 PM

## 2018-06-26 NOTE — Progress Notes (Signed)
Urology Inpatient Progress Report  BILATERAL URETEROVAGINAL FISTULA  Procedure(s): XI ROBOTICALLY ASSISTED LAPAROSCOPIC URETERAL RE-IMPLANTATION  2 Days Post-Op   Intv/Subj: Patient is without complaint. Pain controlled. Tolerating diet. Denies chills. Temperature of 101.4 at 5AM with associated HR 105.   Active Problems:   Right ureteral injury  Current Facility-Administered Medications  Medication Dose Route Frequency Provider Last Rate Last Dose  . docusate sodium (COLACE) capsule 100 mg  100 mg Oral BID Ardis Hughs, MD   100 mg at 06/26/18 1011  . hydrALAZINE (APRESOLINE) injection 5 mg  5 mg Intravenous Q4H PRN Ardis Hughs, MD      . insulin aspart (novoLOG) injection 0-15 Units  0-15 Units Subcutaneous Q4H Ardis Hughs, MD   3 Units at 06/25/18 2148  . ketorolac (TORADOL) 15 MG/ML injection 15 mg  15 mg Intravenous Q6H PRN Ardis Hughs, MD   15 mg at 06/26/18 0556  . morphine 2 MG/ML injection 2 mg  2 mg Intravenous Q1H PRN Ardis Hughs, MD   2 mg at 06/25/18 0215  . ondansetron (ZOFRAN) injection 4 mg  4 mg Intravenous Q6H PRN Ardis Hughs, MD   4 mg at 06/26/18 0556  . opium-belladonna (B&O SUPPRETTES) 16.2-60 MG suppository 1 suppository  1 suppository Rectal Q8H PRN Ardis Hughs, MD      . pantoprazole (PROTONIX) injection 40 mg  40 mg Intravenous Q24H Ardis Hughs, MD   40 mg at 06/25/18 2150  . sodium chloride (OCEAN) 0.65 % nasal spray 1 spray  1 spray Each Nare PRN Cross, Melissa D, NP      . sulfamethoxazole-trimethoprim (BACTRIM DS,SEPTRA DS) 800-160 MG per tablet 1 tablet  1 tablet Oral Q12H Ardis Hughs, MD   1 tablet at 06/26/18 1011  . traMADol (ULTRAM) tablet 50-100 mg  50-100 mg Oral Q6H PRN Ardis Hughs, MD   50 mg at 06/26/18 0556     Objective: Vital: Vitals:   06/25/18 2130 06/26/18 0508 06/26/18 0626 06/26/18 1428  BP: 99/62 108/75  116/74  Pulse: 82 (!) 105  (!) 111  Resp: 17 20     Temp: 98.8 F (37.1 C) (!) 101.4 F (38.6 C) 99.8 F (37.7 C) 99.7 F (37.6 C)  TempSrc: Oral Oral Oral Oral  SpO2: 98% 96%  99%  Weight:      Height:       I/Os: I/O last 3 completed shifts: In: 2040.6 [P.O.:770; I.V.:870.6; Other:100; IV Piggyback:300] Out: 2992 [Urine:3500; Drains:40]  Physical Exam:  General: Patient is in no apparent distress Lungs: Normal respiratory effort, chest expands symmetrically. GI: Incisions are c/d/i.  The abdomen is soft and nontender without mass. JP drain with serosanguinous drainage Foley: clear yellow urine Ext: lower extremities symmetric  Lab Results: Recent Labs    06/24/18 2020 06/26/18 1133  WBC 13.0* 14.0*  HGB 9.8* 9.3*  HCT 30.9* 29.1*   Recent Labs    06/24/18 2020 06/26/18 1133  NA 137 137  K 3.5 3.5  CL 103 102  CO2 25 26  GLUCOSE 185* 110*  BUN 11 11  CREATININE 0.60 0.75  CALCIUM 8.2* 8.3*   No results for input(s): LABPT, INR in the last 72 hours. No results for input(s): LABURIN in the last 72 hours. Results for orders placed or performed during the hospital encounter of 06/16/18  Urine culture     Status: Abnormal   Collection Time: 06/16/18  3:16 PM  Result Value Ref  Range Status   Specimen Description   Final    URINE, CLEAN CATCH Performed at [facility], [place] [address], [place], [place] [address]    Special Requests   Final    NONE Performed at [facility], [place] [address], [place], [place] [address]    Culture (A)  Final    >=100,000 COLONIES/mL GAMMA HEMOLYSIS Standardized susceptibility testing for this organism is not available. 30,000 COLONIES/mL YEAST    Report Status 06/19/2018 FINAL  Final    Studies/Results: Dg Chest 2 View  Result Date: 06/26/2018 CLINICAL DATA:  Fever. EXAM: CHEST - 2 VIEW COMPARISON:  03/04/2018 FINDINGS: The heart size and mediastinal contours are within normal limits. There is no evidence of pulmonary edema,  consolidation, pneumothorax, nodule or pleural fluid. The visualized skeletal structures are unremarkable. IMPRESSION: No active cardiopulmonary disease. Electronically Signed   By: Aletta Edouard M.D.   On: 06/26/2018 13:50    Assessment: Procedure(s): XI ROBOTICALLY ASSISTED LAPAROSCOPIC URETERAL RE-IMPLANTATION, 2 Days Post-Op  doing well.  Plan: Continue regular diet. Check labs, UA, urine culture, CXR due to elevated temperature.  Sliding scale insulin Continue Bactrim Ambulate, SCDs Transition to oral pain meds If afebrile and doing well tomorrow AM, possible dischrge.  Louis Meckel, MD Urology 06/26/2018, 3:19 PM

## 2018-06-27 LAB — CBC
HCT: 27.3 % — ABNORMAL LOW (ref 36.0–46.0)
Hemoglobin: 8.5 g/dL — ABNORMAL LOW (ref 12.0–15.0)
MCH: 27.1 pg (ref 26.0–34.0)
MCHC: 31.1 g/dL (ref 30.0–36.0)
MCV: 86.9 fL (ref 80.0–100.0)
Platelets: 312 K/uL (ref 150–400)
RBC: 3.14 MIL/uL — ABNORMAL LOW (ref 3.87–5.11)
RDW: 15.4 % (ref 11.5–15.5)
WBC: 12.5 K/uL — ABNORMAL HIGH (ref 4.0–10.5)
nRBC: 0 % (ref 0.0–0.2)

## 2018-06-27 LAB — BASIC METABOLIC PANEL WITH GFR
Anion gap: 9 (ref 5–15)
BUN: 10 mg/dL (ref 6–20)
CO2: 24 mmol/L (ref 22–32)
Calcium: 8.1 mg/dL — ABNORMAL LOW (ref 8.9–10.3)
Chloride: 103 mmol/L (ref 98–111)
Creatinine, Ser: 0.59 mg/dL (ref 0.44–1.00)
GFR calc Af Amer: >60 mL/min
GFR calc non Af Amer: >60 mL/min
Glucose, Bld: 85 mg/dL (ref 70–99)
Potassium: 3.6 mmol/L (ref 3.5–5.1)
Sodium: 136 mmol/L (ref 135–145)

## 2018-06-27 LAB — GLUCOSE, CAPILLARY
GLUCOSE-CAPILLARY: 100 mg/dL — AB (ref 70–99)
Glucose-Capillary: 75 mg/dL (ref 70–99)
Glucose-Capillary: 97 mg/dL (ref 70–99)
Glucose-Capillary: 126 mg/dL — ABNORMAL HIGH (ref 70–99)

## 2018-06-27 NOTE — Progress Notes (Signed)
Urology Inpatient Progress Report  BILATERAL URETEROVAGINAL FISTULA  Procedure(s): XI ROBOTICALLY ASSISTED LAPAROSCOPIC URETERAL RE-IMPLANTATION  3 Days Post-Op   Intv/Subj: Patient is without complaint. Pain controlled. Tolerating diet. Additional fever last night with tmax 101.8. HR 101, BP stable. Urine and blood cultures pending. Excellent urine output. Lactate WNL.   Active Problems:   Right ureteral injury  Current Facility-Administered Medications  Medication Dose Route Frequency Provider Last Rate Last Dose  . 0.9 %  sodium chloride infusion   Intravenous Continuous Dorothey Baseman, MD 75 mL/hr at 06/27/18 [phone]    . acetaminophen (TYLENOL) tablet 1,000 mg  1,000 mg Oral Q6H PRN Dorothey Baseman, MD   1,000 mg at 06/27/18 9518  . ceFEPIme (MAXIPIME) 2 g in sodium chloride 0.9 % 100 mL IVPB  2 g Intravenous Q12H Lenis Noon, RPH 200 mL/hr at 06/26/18 1909 2 g at 06/26/18 1909  . docusate sodium (COLACE) capsule 100 mg  100 mg Oral BID Ardis Hughs, MD   100 mg at 06/26/18 2153  . fluconazole (DIFLUCAN) IVPB 200 mg  200 mg Intravenous Q24H Dorothey Baseman, MD 100 mL/hr at 06/26/18 1725 200 mg at 06/26/18 1725  . heparin injection 5,000 Units  5,000 Units Subcutaneous Q8H Dorothey Baseman, MD   5,000 Units at 06/27/18 8416  . hydrALAZINE (APRESOLINE) injection 5 mg  5 mg Intravenous Q4H PRN Ardis Hughs, MD      . insulin aspart (novoLOG) injection 0-15 Units  0-15 Units Subcutaneous Q4H Ardis Hughs, MD   3 Units at 06/25/18 2148  . ketorolac (TORADOL) 15 MG/ML injection 15 mg  15 mg Intravenous Q6H PRN Ardis Hughs, MD   15 mg at 06/26/18 0556  . morphine 2 MG/ML injection 2 mg  2 mg Intravenous Q1H PRN Ardis Hughs, MD   2 mg at 06/25/18 0215  . ondansetron (ZOFRAN) injection 4 mg  4 mg Intravenous Q6H PRN Ardis Hughs, MD   4 mg at 06/26/18 0556  . opium-belladonna (B&O SUPPRETTES) 16.2-60 MG suppository 1 suppository  1  suppository Rectal Q8H PRN Ardis Hughs, MD      . pantoprazole (PROTONIX) injection 40 mg  40 mg Intravenous Q24H Ardis Hughs, MD   40 mg at 06/26/18 2153  . sodium chloride (OCEAN) 0.65 % nasal spray 1 spray  1 spray Each Nare PRN Cross, Melissa D, NP      . sulfamethoxazole-trimethoprim (BACTRIM DS,SEPTRA DS) 800-160 MG per tablet 1 tablet  1 tablet Oral Q12H Ardis Hughs, MD   1 tablet at 06/26/18 2153  . traMADol (ULTRAM) tablet 50-100 mg  50-100 mg Oral Q6H PRN Ardis Hughs, MD   50 mg at 06/26/18 0556     Objective: Vital: Vitals:   06/26/18 1755 06/26/18 2034 06/27/18 0534 06/27/18 0726  BP:  100/61 113/64   Pulse:  92 (!) 101   Resp:  18 16   Temp: 99 F (37.2 C) 99.3 F (37.4 C) (!) 100.8 F (38.2 C) 100.1 F (37.8 C)  TempSrc: Oral Oral Oral Oral  SpO2:  98% 99%   Weight:      Height:       I/Os: I/O last 3 completed shifts: In: 1815.9 [P.O.:760; I.V.:855.9; IV Piggyback:200] Out: 2400 [Urine:2400]  Physical Exam:  General: Patient is in no apparent distress Lungs: Normal respiratory effort, chest expands symmetrically. GI: Incisions are c/d/i.  The abdomen is soft and nontender without mass. Foley:  clear yellow urine Ext: lower extremities symmetric, NTTP, no swelling or erythema.   Lab Results: Recent Labs    06/24/18 2020 06/26/18 1133 06/27/18 0527  WBC 13.0* 14.0* 12.5*  HGB 9.8* 9.3* 8.5*  HCT 30.9* 29.1* 27.3*   Recent Labs    06/24/18 2020 06/26/18 1133 06/27/18 0527  NA 137 137 136  K 3.5 3.5 3.6  CL 103 102 103  CO2 25 26 24   GLUCOSE 185* 110* 85  BUN 11 11 10   CREATININE 0.60 0.75 0.59  CALCIUM 8.2* 8.3* 8.1*   Recent Labs    06/26/18 1703  INR 1.15   No results for input(s): LABURIN in the last 72 hours. Results for orders placed or performed during the hospital encounter of 06/16/18  Urine culture     Status: Abnormal   Collection Time: 06/16/18  3:16 PM  Result Value Ref Range Status    Specimen Description   Final    URINE, CLEAN CATCH Performed at [facility] [address], [place], [place] [address]    Special Requests   Final    NONE Performed at [facility], [place] [address] [place], [place] [address]    Culture (A)  Final    >=100,000 COLONIES/mL GAMMA HEMOLYSIS Standardized susceptibility testing for this organism is not available. 30,000 COLONIES/mL YEAST    Report Status 06/19/2018 FINAL  Final    Studies/Results: Dg Chest 2 View  Result Date: 06/26/2018 CLINICAL DATA:  Fever. EXAM: CHEST - 2 VIEW COMPARISON:  03/04/2018 FINDINGS: The heart size and mediastinal contours are within normal limits. There is no evidence of pulmonary edema, consolidation, pneumothorax, nodule or pleural fluid. The visualized skeletal structures are unremarkable. IMPRESSION: No active cardiopulmonary disease. Electronically Signed   By: Aletta Edouard M.D.   On: 06/26/2018 13:50    Assessment: Procedure(s): XI ROBOTICALLY ASSISTED LAPAROSCOPIC URETERAL RE-IMPLANTATION, 3 Days Post-Op  doing well.  Plan: Continue regular diet. [place] IV Sliding scale insulin Continue cefepime, fluconazole (history of Pseudomonas and yeast) Follow-up cultures Ambulate, SCDs, subcu heparin Continue current pain regimen If afebrile and doing well tomorrow AM, possible discharge.

## 2018-06-28 LAB — GLUCOSE, CAPILLARY
GLUCOSE-CAPILLARY: 161 mg/dL — AB (ref 70–99)
Glucose-Capillary: 94 mg/dL (ref 70–99)
Glucose-Capillary: 147 mg/dL — ABNORMAL HIGH (ref 70–99)
Glucose-Capillary: 124 mg/dL — ABNORMAL HIGH (ref 70–99)

## 2018-06-28 MED ORDER — INSULIN ASPART 100 UNIT/ML ~~LOC~~ SOLN
0.0000 [IU] | Freq: Three times a day (TID) | SUBCUTANEOUS | Status: DC
  Administered 2018-06-28: 3 [IU] via SUBCUTANEOUS

## 2018-06-28 MED ORDER — CIPROFLOXACIN HCL 500 MG PO TABS: 500.0000 mg | ORAL_TABLET | Freq: Two times a day (BID) | ORAL | 0 refills | Status: DC

## 2018-06-28 MED ORDER — CIPROFLOXACIN HCL 500 MG PO TABS
500.0000 mg | ORAL_TABLET | Freq: Two times a day (BID) | ORAL | Status: DC
  Administered 2018-06-28 – 2018-06-29 (×3): 500 mg via ORAL
  Filled 2018-06-28 (×3): qty 1

## 2018-06-28 MED ORDER — PANTOPRAZOLE SODIUM 40 MG PO TBEC
40.0000 mg | DELAYED_RELEASE_TABLET | Freq: Every day | ORAL | Status: DC
  Administered 2018-06-28 – 2018-06-29 (×2): 40 mg via ORAL
  Filled 2018-06-28 (×2): qty 1

## 2018-06-28 MED ORDER — FLUCONAZOLE 150 MG PO TABS: 150.0000 mg | ORAL_TABLET | Freq: Every day | ORAL | 0 refills | Status: DC

## 2018-06-28 MED ORDER — FLUCONAZOLE 150 MG PO TABS
150.0000 mg | ORAL_TABLET | Freq: Every day | ORAL | Status: DC
  Administered 2018-06-28 – 2018-06-29 (×2): 150 mg via ORAL
  Filled 2018-06-28 (×3): qty 1

## 2018-06-28 MED ORDER — METFORMIN HCL ER 500 MG PO TB24
500.0000 mg | ORAL_TABLET | Freq: Every day | ORAL | Status: DC
  Administered 2018-06-28 – 2018-06-29 (×2): 500 mg via ORAL
  Filled 2018-06-28 (×2): qty 1

## 2018-06-28 NOTE — Discharge Summary (Signed)
Date of admission: 06/24/2018  Date of discharge: 06/29/2018  Admission diagnosis: bilateral ureterovaginal fisutla  Discharge diagnosis: same, s/p robotic assisted bilateral reimplant, pyelonephritis  Secondary diagnoses:  Patient Active Problem List   Diagnosis Date Noted  . Right ureteral injury 06/24/2018  . Hypotension   . Anemia 03/03/2018  . Sepsis ([place]) 03/02/2018  . 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 Vaginal discharge 02/16/2018  . 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 Cervical cancer, FIGO stage IB1 ([place]) 02/08/2018  . Abnormal uterine bleeding (AUB) 12/07/2017  . Neuropathic pain 02/06/2014  . Lichen planus 37/62/8315  . Dysuria 01/25/2013  . Cervical cancer ([place]) 07/29/2012  . Perimenopause 07/29/2012  . H/O burning pain in leg 06/24/2012  . CIN I (cervical intraepithelial neoplasia I) 07/11/2011  . LSIL (low grade squamous intraepithelial lesion) on Pap smear 06/11/2011    Procedures performed: Procedure(s): XI ROBOTICALLY ASSISTED LAPAROSCOPIC URETERAL RE-IMPLANTATION  History and Physical: For full details, please see admission history and physical. Briefly, Amy Morrow is a 57 y.o. year old patient with history of cervical cancer and is status post radical hysterectomy and extended pelvic node dissection.  This was complicated by bilateral ureteral injuries.  Stents were placed at the time of recognition of the injuries, and over the course of the last 3 months she has not been able to heal these injuries conservatively.  As such, we opted to proceed with bilateral ureteral reimplant.   Hospital Course: Patient tolerated the procedure well.  She was then transferred to the floor after an uneventful PACU stay.  Her hospital course protracted by fevers.  Her urine cultures were unremarkable as well as her blood cultures and chest x-ray.  Her exam demonstrated some right-sided tenderness on the abdomen.  She was started on broad-spectrum  antibiotics and then narrowed to Cipro prior to discharge.  Her fever curve trended down consistent with a pyelonephritis.  On POD#5 she had met discharge criteria: was eating a regular diet, was up and ambulating independently,  pain was well controlled, she had been afebrile for 24 hours, and was ready to for discharge.  She is being discharged home with her Foley catheter with plans to remove it in the office.    NAD Vitals:   06/28/18 1724 06/28/18 1816 06/28/18 2036 06/29/18 0512  BP:  110/68 109/74 105/74  Pulse: 84 (!) 104 (!) 103 94  Resp:  '18 18 18  ' Temp:  98.9 F (37.2 C) 98.5 F (36.9 C) 98.2 F (36.8 C)  TempSrc:  Oral Oral Oral  SpO2:  97% 98% 97%  Weight:      Height:       Non-labored breathing Abdomen soft - incisions c/d/i Ext symmetric Foley draining clear urine. Laboratory values:  Recent Labs    06/26/18 1133 06/27/18 0527 06/29/18 0641  WBC 14.0* 12.5* 7.1  HGB 9.3* 8.5* 9.3*  HCT 29.1* 27.3* 29.3*   Recent Labs    06/26/18 1133 06/27/18 0527 06/29/18 0641  NA 137 136 136  K 3.5 3.6 4.2  CL 102 103 103  CO2 '26 24 23  ' GLUCOSE 110* 85 111*  BUN '11 10 11  ' CREATININE 0.75 0.59 0.68  CALCIUM 8.3* 8.1* 8.9   Recent Labs    06/26/18 1703  INR 1.15   No results for input(s): LABURIN in the last 72 hours. Results for orders placed or performed during the hospital encounter of 06/24/18  Culture, Urine     Status: Abnormal   Collection  Time: 06/26/18 11:38 AM  Result Value Ref Range Status   Specimen Description   Final    URINE, CATHETERIZED Performed at [facility], [place] [address] [place], [place] [address]    Special Requests   Final    NONE Performed at [facility], [place] [address], [place], [place] [address]    Culture 30,000 COLONIES/mL CANDIDA ALBICANS (A)  Final   Report Status 06/29/2018 FINAL  Final  Culture, blood (Routine X 2) w Reflex to ID Panel     Status: None (Preliminary result)    Collection Time: 06/26/18  5:03 PM  Result Value Ref Range Status   Specimen Description   Final    BLOOD RIGHT ANTECUBITAL Performed at [facility] [address] [place], [place] [address]    Special Requests   Final    BOTTLES DRAWN AEROBIC ONLY Blood Culture adequate volume Performed at [facility] [address], [place], [place] [address]    Culture   Final    NO GROWTH 3 DAYS Performed at [facility], [place] [address], [place], [place] [address]    Report Status PENDING  Incomplete  Culture, blood (Routine X 2) w Reflex to ID Panel     Status: None (Preliminary result)   Collection Time: 06/26/18  5:09 PM  Result Value Ref Range Status   Specimen Description   Final    BLOOD LEFT ANTECUBITAL Performed at [facility] [address], [place], [place] [address]    Special Requests   Final    BOTTLES DRAWN AEROBIC AND ANAEROBIC Blood Culture adequate volume Performed at [facility] [address] [place], [place] [address]    Culture   Final    NO GROWTH 3 DAYS Performed at [facility], [place] [address], [place], [place] [address]    Report Status PENDING  Incomplete    Disposition: Home  Discharge instruction: The patient was instructed to be ambulatory but told to refrain from heavy lifting, strenuous activity, or driving.   Discharge medications:  Allergies as of 06/29/2018   No Known Allergies     Medication List    STOP taking these medications   cephALEXin 500 MG capsule Commonly known as:  KEFLEX     TAKE these medications   acetaminophen 500 MG tablet Commonly known as:  TYLENOL Take 2 tablets (1,000 mg total) by mouth every 6 (six) hours as needed for moderate pain, fever or headache. What changed:  how much to take   ciprofloxacin 500 MG tablet Commonly known as:  CIPRO Take 1 tablet (500 mg total) by mouth 2 (two) times daily.   docusate sodium 100  MG capsule Commonly known as:  COLACE Take 1 capsule (100 mg total) by mouth 2 (two) times daily as needed (take to keep stool soft.).   fluconazole 150 MG tablet Commonly known as:  DIFLUCAN Take 1 tablet (150 mg total) by mouth daily.   metFORMIN 500 MG 24 hr tablet Commonly known as:  GLUCOPHAGE-XR Take 1 tablet (500 mg total) by mouth daily with breakfast.   ondansetron 4 MG disintegrating tablet Commonly known as:  ZOFRAN-ODT 56m ODT q4 hours prn nausea/vomit   traMADol 50 MG tablet Commonly known as:  ULTRAM Take 1-2 tablets (50-100 mg total) by mouth every 6 (six) hours as needed. What changed:  how much to take       Followup:  Follow-up  Information    Ardis Hughs, MD On 07/09/2018.   Specialty:  Urology Why:  Catheter removal at 10:30am Contact information: [place] [place] [address] [phone]

## 2018-06-28 NOTE — Progress Notes (Signed)
Urology Inpatient Progress Report  BILATERAL URETEROVAGINAL FISTULA  Procedure(s): XI ROBOTICALLY ASSISTED LAPAROSCOPIC URETERAL RE-IMPLANTATION  4 Days Post-Op   Intv/Subj: Patient is without complaint. Pain controlled. Tolerating diet. Low grade fever NAE  Active Problems:   Right ureteral injury  Current Facility-Administered Medications  Medication Dose Route Frequency Provider Last Rate Last Dose  . acetaminophen (TYLENOL) tablet 1,000 mg  1,000 mg Oral Q6H PRN Dorothey Baseman, MD   1,000 mg at 06/27/18 1830  . ciprofloxacin (CIPRO) tablet 500 mg  500 mg Oral BID Ardis Hughs, MD      . docusate sodium (COLACE) capsule 100 mg  100 mg Oral BID Ardis Hughs, MD   100 mg at 06/27/18 2145  . fluconazole (DIFLUCAN) IVPB 200 mg  200 mg Intravenous Q24H Dorothey Baseman, MD 100 mL/hr at 06/27/18 1826 200 mg at 06/27/18 1826  . heparin injection 5,000 Units  5,000 Units Subcutaneous Q8H Dorothey Baseman, MD   5,000 Units at 06/28/18 0532  . hydrALAZINE (APRESOLINE) injection 5 mg  5 mg Intravenous Q4H PRN Ardis Hughs, MD      . insulin aspart (novoLOG) injection 0-15 Units  0-15 Units Subcutaneous Q4H Ardis Hughs, MD   3 Units at 06/25/18 2148  . ketorolac (TORADOL) 15 MG/ML injection 15 mg  15 mg Intravenous Q6H PRN Ardis Hughs, MD   15 mg at 06/26/18 0556  . morphine 2 MG/ML injection 2 mg  2 mg Intravenous Q1H PRN Ardis Hughs, MD   2 mg at 06/25/18 0215  . ondansetron (ZOFRAN) injection 4 mg  4 mg Intravenous Q6H PRN Ardis Hughs, MD   4 mg at 06/26/18 0556  . opium-belladonna (B&O SUPPRETTES) 16.2-60 MG suppository 1 suppository  1 suppository Rectal Q8H PRN Ardis Hughs, MD      . pantoprazole (PROTONIX) injection 40 mg  40 mg Intravenous Q24H Ardis Hughs, MD   40 mg at 06/27/18 2146  . sodium chloride (OCEAN) 0.65 % nasal spray 1 spray  1 spray Each Nare PRN Cross, Melissa D, NP      .  sulfamethoxazole-trimethoprim (BACTRIM DS,SEPTRA DS) 800-160 MG per tablet 1 tablet  1 tablet Oral Q12H Ardis Hughs, MD   1 tablet at 06/27/18 2146  . traMADol (ULTRAM) tablet 50-100 mg  50-100 mg Oral Q6H PRN Ardis Hughs, MD   50 mg at 06/28/18 0533     Objective: Vital: Vitals:   06/27/18 1225 06/27/18 1827 06/27/18 2137 06/28/18 0531  BP: 110/71  105/71 108/74  Pulse: 88  92 97  Resp: 16  16 16   Temp: 99.1 F (37.3 C) (!) 100.9 F (38.3 C) 98.2 F (36.8 C) 98.8 F (37.1 C)  TempSrc: Oral Oral Oral Oral  SpO2: 96%  94% 100%  Weight:      Height:       I/Os: I/O last 3 completed shifts: In: 2355.9 [P.O.:900; I.V.:855.9; IV Piggyback:600.1] Out: 4775 [Urine:4775]  Physical Exam:  General: Patient is in no apparent distress Lungs: Normal respiratory effort, chest expands symmetrically. GI: Incisions are c/d/i.  The abdomen is soft and appropriately tender without mass.  Right side specifically more tender. No CVA tenderness Foley: clear yellow urine Ext: lower extremities symmetric, NTTP, no swelling or erythema.   Lab Results: Recent Labs    06/26/18 1133 06/27/18 0527  WBC 14.0* 12.5*  HGB 9.3* 8.5*  HCT 29.1* 27.3*   Recent Labs    06/26/18  1133 06/27/18 0527  NA 137 136  K 3.5 3.6  CL 102 103  CO2 26 24  GLUCOSE 110* 85  BUN 11 10  CREATININE 0.75 0.59  CALCIUM 8.3* 8.1*   Recent Labs    06/26/18 1703  INR 1.15   No results for input(s): LABURIN in the last 72 hours. Results for orders placed or performed during the hospital encounter of 06/24/18  Culture, Urine     Status: None (Preliminary result)   Collection Time: 06/26/18 11:38 AM  Result Value Ref Range Status   Specimen Description   Final    URINE, CATHETERIZED Performed at [facility], [place] [address] [place], [place] [address]    Special Requests   Final    NONE Performed at [facility], [place] [address] [place], [place]  [address]    Culture   Final    CULTURE REINCUBATED FOR BETTER GROWTH Performed at [facility], [place] [address], [place], [place] [address]    Report Status PENDING  Incomplete  Culture, blood (Routine X 2) w Reflex to ID Panel     Status: None (Preliminary result)   Collection Time: 06/26/18  5:03 PM  Result Value Ref Range Status   Specimen Description   Final    BLOOD RIGHT ANTECUBITAL Performed at [facility] [address], [place], [place] [address]    Special Requests   Final    BOTTLES DRAWN AEROBIC ONLY Blood Culture adequate volume Performed at [facility] [address], [place], [place] [address]    Culture   Final    NO GROWTH 2 DAYS Performed at [facility] [address], [place], [place] [address]    Report Status PENDING  Incomplete  Culture, blood (Routine X 2) w Reflex to ID Panel     Status: None (Preliminary result)   Collection Time: 06/26/18  5:09 PM  Result Value Ref Range Status   Specimen Description   Final    BLOOD LEFT ANTECUBITAL Performed at [facility] [address] [place], [place] [address]    Special Requests   Final    BOTTLES DRAWN AEROBIC AND ANAEROBIC Blood Culture adequate volume Performed at [facility] [address], [place], [place] [address]    Culture   Final    NO GROWTH 2 DAYS Performed at [facility] [address] [place], [place] [address]    Report Status PENDING  Incomplete    Studies/Results: Dg Chest 2 View  Result Date: 06/26/2018 CLINICAL DATA:  Fever. EXAM: CHEST - 2 VIEW COMPARISON:  03/04/2018 FINDINGS: The heart size and mediastinal contours are within normal limits. There is no evidence of pulmonary edema, consolidation, pneumothorax, nodule or pleural fluid. The visualized skeletal structures are unremarkable. IMPRESSION: No active cardiopulmonary disease. Electronically Signed   By: Aletta Edouard M.D.    On: 06/26/2018 13:50    Assessment: Procedure(s): XI ROBOTICALLY ASSISTED LAPAROSCOPIC URETERAL RE-IMPLANTATION, 4 Days Post-Op  doing well.   I suspect that she has developed right pyelonephritis.  All cultures have been negative.  Fevers are trending down.  Treating her based on previous cultures.  TRansition to oral medication and see how her fevers trend.  If she continues to spike, will obtain CT scan.  Plan: Continue regular diet. Medlock IV Sliding scale insulin Transition to oral cipro and fluconazole. Follow-up cultures Ambulate, SCDs, subcu heparin Continue current pain  regimen If afebrile and doing well tomorrow AM, possible discharge.

## 2018-06-28 NOTE — Progress Notes (Signed)
PHARMACIST - PHYSICIAN COMMUNICATION  DR:   Louis Meckel  CONCERNING: IV to Oral Route Change Policy  RECOMMENDATION: This patient is receiving pantoprazole by the intravenous route.  Based on criteria approved by the Pharmacy and Therapeutics Committee, the intravenous medication(s) is/are being converted to the equivalent oral dose form(s).   DESCRIPTION: These criteria include:  The patient is eating (either orally or via tube) and/or has been taking other orally administered medications for a least 24 hours  The patient has no evidence of active gastrointestinal bleeding or impaired GI absorption (gastrectomy, short bowel, patient on TNA or NPO).  If you have questions about this conversion, please contact the Pharmacy Department  []   [phone] )  [facility] []   [phone] )  [facility] []   [phone] )  [facility] []   [phone] )  [facility] [x]   [phone] )  [place], [facility] 06/28/2018 4:23 PM

## 2018-06-29 LAB — BASIC METABOLIC PANEL
Anion gap: 10 (ref 5–15)
BUN: 11 mg/dL (ref 6–20)
CO2: 23 mmol/L (ref 22–32)
CREATININE: 0.68 mg/dL (ref 0.44–1.00)
Calcium: 8.9 mg/dL (ref 8.9–10.3)
Chloride: 103 mmol/L (ref 98–111)
GFR calc Af Amer: >60 mL/min (ref >60)
Glucose, Bld: 111 mg/dL — ABNORMAL HIGH (ref 70–99)
POTASSIUM: 4.2 mmol/L (ref 3.5–5.1)
SODIUM: 136 mmol/L (ref 135–145)

## 2018-06-29 LAB — CBC
HCT: 29.3 % — ABNORMAL LOW (ref 36.0–46.0)
Hemoglobin: 9.3 g/dL — ABNORMAL LOW (ref 12.0–15.0)
MCH: 27.1 pg (ref 26.0–34.0)
MCHC: 31.7 g/dL (ref 30.0–36.0)
MCV: 85.4 fL (ref 80.0–100.0)
PLATELETS: 478 10*3/uL — AB (ref 150–400)
RBC: 3.43 MIL/uL — AB (ref 3.87–5.11)
RDW: 14.9 % (ref 11.5–15.5)
WBC: 7.1 10*3/uL (ref 4.0–10.5)
nRBC: 0 % (ref 0.0–0.2)

## 2018-06-29 LAB — URINE CULTURE

## 2018-06-29 LAB — GLUCOSE, CAPILLARY: GLUCOSE-CAPILLARY: 103 mg/dL — AB (ref 70–99)

## 2018-06-29 NOTE — Progress Notes (Addendum)
Pt discharged home today per Dr. Louis Meckel. Pt's IV site D/C'd and WDL. Pt's VSS. Pt and son educated on use of foley leg bag/standard drainage bag. Verbalized understanding. Foley leg bag applied prior to leaving. Pt and son provided with home medication list, discharge instructions and prescriptions. Verbalized understanding. Pt left floor via WC in stable condition accompanied by RN and NT.

## 2018-07-01 LAB — CULTURE, BLOOD (ROUTINE X 2)
CULTURE: NO GROWTH
Culture: NO GROWTH
Special Requests: ADEQUATE
Special Requests: ADEQUATE

## 2018-08-27 ENCOUNTER — Inpatient Hospital Stay: Payer: BLUE CROSS/BLUE SHIELD | Attending: Gynecologic Oncology | Admitting: Gynecologic Oncology

## 2018-08-27 ENCOUNTER — Encounter: Payer: Self-pay | Admitting: Gynecologic Oncology

## 2018-08-27 VITALS — BP 120/85 | HR 93 | Temp 98.9°F | Resp 20 | Ht 59.0 in | Wt 147.3 lb

## 2018-08-27 DIAGNOSIS — C7982 Secondary malignant neoplasm of genital organs: Secondary | ICD-10-CM | POA: Insufficient documentation

## 2018-08-27 DIAGNOSIS — C539 Malignant neoplasm of cervix uteri, unspecified: Secondary | ICD-10-CM

## 2018-08-27 DIAGNOSIS — N898 Other specified noninflammatory disorders of vagina: Secondary | ICD-10-CM | POA: Diagnosis not present

## 2018-08-27 DIAGNOSIS — Z90722 Acquired absence of ovaries, bilateral: Secondary | ICD-10-CM | POA: Diagnosis not present

## 2018-08-27 DIAGNOSIS — Z90710 Acquired absence of both cervix and uterus: Secondary | ICD-10-CM | POA: Insufficient documentation

## 2018-08-27 NOTE — Patient Instructions (Signed)
Please notify Dr Denman George at phone number [phone] if you notice vaginal bleeding, new pelvic or abdominal pains, bloating, feeling full easy, or a change in bladder or bowel function.   Dr Serita Grit office will notify you of the results of today's biopsy.  Pleas return to see her in 3 months.

## 2018-08-27 NOTE — Progress Notes (Signed)
Follow-up Note: GYN-ONC  Consult was requested by Dr. Darron Doom, MD   CC:  Chief Complaint  Patient presents with  . Cervical cancer, FIGO stage IB2 (HCC)  history of uretero-vaginal fistulae.  Patient was seen with an interpretor.   Assessment/Plan: 1. Stage IB1 cervical carcinoma o S/p radical hysterectomy with pelvic lymphadenectomy July, 2019 o Low risk features, no adjuvant therapy recommended o Recommend surveillance follow-up in 3 months. Pap annually in July.  o Vaginal lesion on exam today - at site of prior fistulous tract therefore likely granulation tissue. Will follow-up biopsy. If recurrent malignancy, would need radiation.  2. Ureterovaginal fistula (bilateral)  o S/p repair with Dr Louis Meckel on 06/24/18.  o Has residual symptoms of sensation of incomplete emptying (resolved with repositioning).    I will see her back in 3 months for surveillance.  HPI: [name]  is a very nice 58 y.o.  P5  She has been undergoing cervical cancer screening through [place] [facility]. 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 01/2012 LSIL + endometrial cells 07/2012 Pap negative 01/2013 Pap negative with HRHPV not detected 01/2015 Pap negative and HRHPV not detected  Was noting postmenopausal bleeding and saw [facility] again. 12/2017 Pap negative now HRHPV detected - Mass noted on exam and she was referred to the [place] [facility] where Dr. 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   Referred for management. Only complaint is the PMB. Denies pain. States bleeding minimal.  She underwent a PET/CT which showed no apparent metastatic disease. On 02/08/18 she underwent robotic assisted radical hysterectomy, upper vaginectomy, bilateral pelvic lymphadenectomy.  Operative findings were significant for a 2 to 3 cm pedunculated exophytic mass from the right anterior cervix.  There is no clinical involvement of the parametrium and no  suspicious nodes.  The surgery was overall fairly uncomplicated there was some increased bleeding encountered during the dissection around the posterior bladder pillars.  At the completion of the procedure the ureters bilaterally appeared intact and were completely skeletonized in the distal third free from all parametrial tissue.  Patient was readmitted on postop day 7 with bilateral ureterovaginal fistulas is confirmed on both CT urogram, as well as cystoscopy with retrograde pyelography with Dr. Louis Meckel.  During that cystoscopic procedure Dr. Louis Meckel placed ureteral stents bilaterally.  The patient was treated with broad-spectrum antibiotics to cover pelvic infection.  She was discharged after a 3-day hospital stay.  On 03/02/18 she underwent right percutaneous nephrostomy tube placement. Immediately after placement she developed fevers and hypotension and was admitted with sepsis from occult pyelonephritis. She received IVF resuscitation and IV antibiotics. She was discharged on 03/06/18. After placement of the right PCN she noted slight decrease in urinary leakage from the vagina, though still persisted.  Therefore placement of a left PCN was placed on 03/18/18 and this improved urinary leakage from the vagina substantially. She was placed on prophylactic nitrofurantoid 100mg  daily.  Interval Hx:  She was followed by Dr Louis Meckel who performed ureteroscopy in early October, 2019 and identified resolution of left fistula and almost resolution in right. The stents were replaced with a plan to remove PCN's later this month and then stents if fistulae clinically resolved.  On 06/24/18 Dr Louis Meckel performed robotic assisted bilateral ureteral reimplantation (ureteroneocystotomy). She did well postop.   Postop she reports symptoms of incomplete voiding after emptying her bladder that requires change in position of her pelvis in order to completely empty.  She denies symptoms of urinary tract infection or  pyelonephritis.  She denies vaginal bleeding symptoms.  She is not sexually active.   Current Meds:  Outpatient Encounter Medications as of 08/27/2018  Medication Sig  . acetaminophen (TYLENOL) 500 MG tablet Take 2 tablets (1,000 mg total) by mouth every 6 (six) hours as needed for moderate pain, fever or headache.  . [DISCONTINUED] ciprofloxacin (CIPRO) 500 MG tablet Take 1 tablet (500 mg total) by mouth 2 (two) times daily. (Patient not taking: Reported on 08/27/2018)  . [DISCONTINUED] docusate sodium (COLACE) 100 MG capsule Take 1 capsule (100 mg total) by mouth 2 (two) times daily as needed (take to keep stool soft.). (Patient not taking: Reported on 08/27/2018)  . [DISCONTINUED] fluconazole (DIFLUCAN) 150 MG tablet Take 1 tablet (150 mg total) by mouth daily. (Patient not taking: Reported on 08/27/2018)  . [DISCONTINUED] metFORMIN (GLUCOPHAGE XR) 500 MG 24 hr tablet Take 1 tablet (500 mg total) by mouth daily with breakfast. (Patient not taking: Reported on 08/27/2018)  . [DISCONTINUED] ondansetron (ZOFRAN ODT) 4 MG disintegrating tablet 4mg  ODT q4 hours prn nausea/vomit (Patient not taking: Reported on 08/27/2018)  . [DISCONTINUED] traMADol (ULTRAM) 50 MG tablet Take 1-2 tablets (50-100 mg total) by mouth every 6 (six) hours as needed. (Patient not taking: Reported on 08/27/2018)   No facility-administered encounter medications on file as of 08/27/2018.     Allergy: No Known Allergies  Social Hx:  Tobacco use: none Alcohol use: none Illicit Drug use: none Illicit IV Drug use: none  Past Surgical Hx:  Past Surgical History:  Procedure Laterality Date  . ABDOMINAL HYSTERECTOMY    . BREAST BIOPSY Left 06/15/2012  . CYSTOSCOPY W/ RETROGRADES Bilateral 02/16/2018   Procedure: CYSTOSCOPY WITH RETROGRADE PYELOGRAM BILATERAL DIAGNOSTIC URETEROSCOPY, BILATERAL  STENT PLACEMENT;  Surgeon: Ardis Hughs, MD;  Location: [facility];  Service: Urology;  Laterality: Bilateral;  . CYSTOSCOPY W/  URETERAL STENT PLACEMENT Bilateral 04/22/2018   Procedure: CYSTOSCOPY WITH BILATERAL RETROGRADE PYELOGRAM/ AND URETERAL STENT EXCHANGE, vaginal exam;  Surgeon: Ardis Hughs, MD;  Location: [facility];  Service: Urology;  Laterality: Bilateral;  . CYSTOSCOPY W/ URETERAL STENT PLACEMENT Bilateral 05/28/2018   Procedure: CYSTOSCOPY WITH BILATERAL  RETROGRADE PYELOGRAM/URETERAL STENT REPLACEMENT, REMOVAL OF BILATERAL PERCUTANEOUS DRAINS;  Surgeon: Ardis Hughs, MD;  Location: [facility];  Service: Urology;  Laterality: Bilateral;  . D & C LEEP CONIZATION BX  07-31-2003   dr p. rose [facility]  . IR NEPHROSTOGRAM RIGHT THRU EXISTING ACCESS  03/18/2018  . IR NEPHROSTOMY EXCHANGE LEFT  04/27/2018  . IR NEPHROSTOMY EXCHANGE RIGHT  04/27/2018  . IR NEPHROSTOMY PLACEMENT LEFT  03/18/2018  . IR NEPHROSTOMY PLACEMENT RIGHT  03/02/2018  . PELVIC LYMPH NODE DISSECTION Bilateral 02/08/2018   Procedure: BILATEAL PEVIC  LYMPHADENECTOMY;  Surgeon: [name], MD;  Location: [facility];  Service: Gynecology;  Laterality: Bilateral;  . ROBOTIC ASSISTED TOTAL HYSTERECTOMY WITH BILATERAL SALPINGO OOPHERECTOMY N/A 02/08/2018   Procedure: XI ROBOTIC ASSISTED TOTAL Radical HYSTERECTOMY WITH BILATERAL SALPINGO OOPHORECTOMY;  Surgeon: [name], MD;  Location: [facility];  Service: Gynecology;  Laterality: N/A;  . Transvaginal tape placement  03-12-2009  dr Emeterio Reeve  [facility]   GYNECARE TENSION-FREE VAGINAL TAPE SLING  . TUBAL LIGATION  05-28-2005  DR  MARSHALL  @ [facility]   PPTL    Past Medical Hx:  Past Medical History:  Diagnosis Date  . Abnormal Pap smear   . Anemia   . Cervical cancer ([place])  oncologist- dr Denman George   Stage IB1  SCCa   . Dyspnea   . Fatigue   . Hyperlipidemia   . Left breast mass 2013   7x4x4 mm 6o'clock  . PONV (postoperative nausea and vomiting)   . Pre-diabetes   . Sepsis ([place]) 03/2018   Urosepsis, Resolved  . Thyroid nodule 03/25/2018   inferior right thyroid, noted on  US thyroid  . Ureterovaginal fistula   . Urgency of urination    intermittant  . Urinary frequency     Past Gynecological History:   GYNECOLOGIC HISTORY:  Patient's last menstrual period was 11/16/2012. Menarche: 58 years old P 5 LMP 58 yo Contraceptive 5 years OCP HRT none  Last Pap see HPI  Family Hx:  Family History  Problem Relation Age of Onset  . Hypertension Mother   . Heart disease Mother   . Hypertension Brother   . Heart disease Brother     Review of Systems:  Review of Systems  Constitutional: Negative.   HENT:  Negative.   Eyes: Negative.   Respiratory: Negative.   Cardiovascular: Negative.   Endocrine: Negative.   Genitourinary: Negative.         No leakage of urine from vagina. + sensation of incomplete emptying.   Musculoskeletal: Negative.   Skin: Negative.   Neurological: Negative.   Hematological: Negative.   Psychiatric/Behavioral: Negative for depression.    Vitals:  Blood pressure 120/85, pulse 93, temperature 98.9 F (37.2 C), temperature source Oral, resp. rate 20, height 4\' 11"  (1.499 m), weight 147 lb 4.8 oz (66.8 kg), last menstrual period 11/16/2012, SpO2 100 %. Body mass index is 29.75 kg/m.   Physical Exam: ECOG PERFORMANCE STATUS: 1 - Symptomatic but completely ambulatory   General :  Well developed, 58 y.o., female in no apparent distress HEENT:  Normocephalic/atraumatic, symmetric, EOMI, eyelids normal Neck:   Supple, no masses.  Lymphatics:  No cervical/ submandibular/ supraclavicular/ infraclavicular/ inguinal adenopathy Respiratory:  Respirations unlabored, no use of accessory muscles CV:   Deferred Breast:  Deferred Musculoskeletal: No CVA tenderness, normal muscle strength. Abdomen:  Soft, non-tender and nondistended. No evidence of hernia. No masses. Well healed incisions.  Extremities:  No lymphedema, no erythema, non-tender. Skin:   Normal inspection Neuro/Psych:  No focal motor deficit, no abnormal mental  status. Normal gait. Normal affect. Alert and oriented to person, place, and time  Genito Urinary: Speculum exam reveals no blood. 1.5cm papillary nodular (but soft) flesh colored exophytic lesion from left vaginal fornix. Does not palpably infiltrate towards rectum or involve paravaginal tissues.  Rectovaginal:  No rectal lesions, mass not appreciated via rectum, no paravaginal infiltration.    Procedure Note:  Preop Dx: vaginal mass Postop Dx: same Procedure: vaginal biopsy Surgeon: Dorann Ou, MD EBL: minimal Specimens: left upper vagina for histology Complications: none Procedure Details: Patient provided verbal consent with a translator.  Verbal timeout was performed.  The lesion in the left upper vagina was visualized with a speculum.  A Tischler biopsy forcep was used to take a biopsy representative of the lesion.  2 samples were taken.  There was brisk bleeding from the biopsy site.  This was made hemostatic with silver nitrate sticks.  The patient tolerated the procedure well.  The specimen was sent for histopathology.   Thereasa Solo, MD  08/27/2018, 4:21 PM    Cc: Darron Doom, MD (Referring Ob/Gyn)

## 2018-09-02 ENCOUNTER — Telehealth: Payer: Self-pay | Admitting: Oncology

## 2018-09-02 ENCOUNTER — Ambulatory Visit: Payer: Medicaid Other | Admitting: Family Medicine

## 2018-09-02 NOTE — Telephone Encounter (Signed)
[employer] customer service line [phone]) multiple times to find out if patient's visits with Dr. Denman George and Dr. Sondra Come will be covered.  This caller was placed on hold for long time periods (45 minutes) without any answer.

## 2018-09-03 ENCOUNTER — Encounter: Payer: Self-pay | Admitting: Gynecologic Oncology

## 2018-09-03 ENCOUNTER — Inpatient Hospital Stay (HOSPITAL_BASED_OUTPATIENT_CLINIC_OR_DEPARTMENT_OTHER): Payer: BLUE CROSS/BLUE SHIELD | Admitting: Gynecologic Oncology

## 2018-09-03 ENCOUNTER — Encounter: Payer: Self-pay | Admitting: Oncology

## 2018-09-03 VITALS — BP 152/82 | HR 106 | Temp 98.6°F | Resp 20 | Ht 59.0 in | Wt 146.0 lb

## 2018-09-03 DIAGNOSIS — C539 Malignant neoplasm of cervix uteri, unspecified: Secondary | ICD-10-CM

## 2018-09-03 DIAGNOSIS — C7982 Secondary malignant neoplasm of genital organs: Secondary | ICD-10-CM | POA: Diagnosis not present

## 2018-09-03 NOTE — Progress Notes (Signed)
Follow-up Note: GYN-ONC  Consult was requested by Dr. Darron Doom, MD   CC:  Chief Complaint  Patient presents with  . Cervical cancer, FIGO stage IB2 (HCC)  history of uretero-vaginal fistulae.  Patient was seen with an interpretor.   Assessment/Plan: 1. 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 Will made referrals to med onc and rad onc  2. Ureterovaginal fistula (bilateral)  o S/p repair with Dr Louis Meckel on 06/24/18.  o Has residual symptoms of sensation of incomplete emptying (resolved with repositioning).    I will see her back after completion of therapy  HPI: Ms. Candiace West Tipping  is a very nice 58 y.o.  P5  She has been undergoing cervical cancer screening through [place] [facility]. 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 01/2012 LSIL + endometrial cells 07/2012 Pap negative 01/2013 Pap negative with HRHPV not detected 01/2015 Pap negative and HRHPV not detected  Was noting postmenopausal bleeding and saw [facility] again. 12/2017 Pap negative now HRHPV detected - Mass noted on exam and she was referred to the [facility] where Dr. 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   Referred for management. Only complaint is the PMB. Denies pain. States bleeding minimal.  She underwent a PET/CT which showed no apparent metastatic disease. On 02/08/18 she underwent robotic assisted radical hysterectomy, upper vaginectomy, bilateral pelvic lymphadenectomy.  Operative findings were significant for a 2 to 3 cm pedunculated exophytic mass from the right anterior cervix.  There is no clinical involvement of the parametrium and no suspicious nodes.  The surgery was overall  fairly uncomplicated there was some increased bleeding encountered during the dissection around the posterior bladder pillars.  At the completion of the procedure the ureters bilaterally appeared intact and were completely skeletonized in the distal third free from all parametrial tissue.  Patient was readmitted on postop day 7 with bilateral ureterovaginal fistulas is confirmed on both CT urogram, as well as cystoscopy with retrograde pyelography with Dr. Louis Meckel.  During that cystoscopic procedure Dr. Louis Meckel placed ureteral stents bilaterally.  The patient was treated with broad-spectrum antibiotics to cover pelvic infection.  She was discharged after a 3-day hospital stay.  On 03/02/18 she underwent right percutaneous nephrostomy tube placement. Immediately after placement she developed fevers and hypotension and was admitted with sepsis from occult pyelonephritis. She received IVF resuscitation and IV antibiotics. She was discharged on 03/06/18. After placement of the right PCN she noted slight decrease in urinary leakage from the vagina, though still persisted.  Therefore placement of a left PCN was placed on 03/18/18 and this improved urinary leakage from the vagina substantially. She was placed on prophylactic nitrofurantoid 100mg  daily.  She was followed by Dr Louis Meckel who performed ureteroscopy in early October, 2019 and identified resolution of left fistula and almost resolution in right. The stents were replaced with a plan to remove PCN's later this month and then stents if fistulae clinically resolved.  On 06/24/18 Dr Louis Meckel performed robotic assisted bilateral ureteral reimplantation (ureteroneocystotomy). She did well postop.   Postop she reported symptoms of incomplete voiding after emptying her bladder that requires change in position of her pelvis in order to completely empty.   Interval Hx:  She was seen on  08/27/17 as part of routine scheduled surveillance. She denied vaginal  bleeding symptoms.   On vaginal examiation, a soft exophytic 1cm lesion was identified at the left vaginal fornix. It was biopsied. It was resulted as squamous cell carcinoma, consistent with recurrence.    Current Meds:  Outpatient Encounter Medications as of 09/03/2018  Medication Sig  . acetaminophen (TYLENOL) 500 MG tablet Take 2 tablets (1,000 mg total) by mouth every 6 (six) hours as needed for moderate pain, fever or headache.  . ciprofloxacin (CIPRO) 500 MG tablet Take 500 mg by mouth 2 (two) times daily.   No facility-administered encounter medications on file as of 09/03/2018.     Allergy: No Known Allergies  Social Hx:  Tobacco use: none Alcohol use: none Illicit Drug use: none Illicit IV Drug use: none  Past Surgical Hx:  Past Surgical History:  Procedure Laterality Date  . ABDOMINAL HYSTERECTOMY    . BREAST BIOPSY Left 06/15/2012  . CYSTOSCOPY W/ RETROGRADES Bilateral 02/16/2018   Procedure: CYSTOSCOPY WITH RETROGRADE PYELOGRAM BILATERAL DIAGNOSTIC URETEROSCOPY, BILATERAL  STENT PLACEMENT;  Surgeon: Ardis Hughs, MD;  [facility];  Service: Urology;  Laterality: Bilateral;  . CYSTOSCOPY W/ URETERAL STENT PLACEMENT Bilateral 04/22/2018   Procedure: CYSTOSCOPY WITH BILATERAL RETROGRADE PYELOGRAM/ AND URETERAL STENT EXCHANGE, vaginal exam;  Surgeon: Ardis Hughs, MD;  Location[facility];  Service: Urology;  Laterality: Bilateral;  . CYSTOSCOPY W/ URETERAL STENT PLACEMENT Bilateral 05/28/2018   Procedure: CYSTOSCOPY WITH BILATERAL  RETROGRADE PYELOGRAM/URETERAL STENT REPLACEMENT, REMOVAL OF BILATERAL PERCUTANEOUS DRAINS;  Surgeon: Ardis Hughs, MD;  Location: [facility];  Service: Urology;  Laterality: Bilateral;  . D & C LEEP CONIZATION BX  07-31-2003   dr p. [facility]  . IR NEPHROSTOGRAM RIGHT THRU EXISTING ACCESS  03/18/2018  . IR NEPHROSTOMY EXCHANGE LEFT  04/27/2018  . IR NEPHROSTOMY EXCHANGE RIGHT  04/27/2018  . IR  NEPHROSTOMY PLACEMENT LEFT  03/18/2018  . IR NEPHROSTOMY PLACEMENT RIGHT  03/02/2018  . PELVIC LYMPH NODE DISSECTION Bilateral 02/08/2018   Procedure: BILATEAL PEVIC  LYMPHADENECTOMY;  Surgeon: [name], MD;  [facility];  Service: Gynecology;  Laterality: Bilateral;  . ROBOTIC ASSISTED TOTAL HYSTERECTOMY WITH BILATERAL SALPINGO OOPHERECTOMY N/A 02/08/2018   Procedure: XI ROBOTIC ASSISTED TOTAL Radical HYSTERECTOMY WITH BILATERAL SALPINGO OOPHORECTOMY;  Surgeon: [name], MD;  Location: [facility];  Service: Gynecology;  Laterality: N/A;  . Transvaginal tape placement  03-12-2009  dr Emeterio Reeve  [facility]   GYNECARE TENSION-FREE VAGINAL TAPE SLING  . TUBAL LIGATION  05-28-2005  DR  MARSHALL  @ [facility]   PPTL    Past Medical Hx:  Past Medical History:  Diagnosis Date  . Abnormal Pap smear   . Anemia   . Cervical cancer [facility]) oncologist- dr Denman George   Stage IB1  SCCa   . Dyspnea   . Fatigue   . Hyperlipidemia   . Left breast mass 2013   7x4x4 mm 6o'clock  . PONV (postoperative nausea and vomiting)   . Pre-diabetes   . Sepsis ([place]) 03/2018   Urosepsis, Resolved  . Thyroid nodule 03/25/2018   inferior right thyroid, noted on US thyroid  . Ureterovaginal fistula   . Urgency of urination    intermittant  . Urinary frequency     Past Gynecological History:   GYNECOLOGIC HISTORY:  Patient's last menstrual period was 11/16/2012. Menarche: 58 years old P 5 LMP 58 yo Contraceptive 5 years OCP HRT none  Last Pap see  HPI  Family Hx:  Family History  Problem Relation Age of Onset  . Hypertension Mother   . Heart disease Mother   . Hypertension Brother   . Heart disease Brother     Review of Systems:  Review of Systems  Constitutional: Negative.   HENT:  Negative.   Eyes: Negative.   Respiratory: Negative.   Cardiovascular: Negative.   Endocrine: Negative.   Genitourinary: Negative.         No leakage of urine from vagina. + sensation of incomplete emptying.   Musculoskeletal:  Negative.   Skin: Negative.   Neurological: Negative.   Hematological: Negative.   Psychiatric/Behavioral: Negative for depression.    Vitals:  Blood pressure (!) 152/82, pulse (!) 106, temperature 98.6 F (37 C), temperature source Oral, resp. rate 20, height 4\' 11"  (1.499 m), weight 146 lb (66.2 kg), last menstrual period 11/16/2012, SpO2 100 %. Body mass index is 29.49 kg/m.   Physical Exam: ECOG PERFORMANCE STATUS: 1 - Symptomatic but completely ambulatory   General :  Well developed, 58 y.o., female in no apparent distress HEENT:  Normocephalic/atraumatic, symmetric, EOMI, eyelids normal Neck:   Supple, no masses.  Lymphatics:  No cervical/ submandibular/ supraclavicular/ infraclavicular/ inguinal adenopathy Respiratory:  Respirations unlabored, no use of accessory muscles CV:   Deferred Breast:  Deferred Musculoskeletal: No CVA tenderness, normal muscle strength. Abdomen:  Soft, non-tender and nondistended. No evidence of hernia. No masses. Well healed incisions.  Extremities:  No lymphedema, no erythema, non-tender. Skin:   Normal inspection Neuro/Psych:  No focal motor deficit, no abnormal mental status. Normal gait. Normal affect. Alert and oriented to person, place, and time  Genito Urinary: Speculum exam reveals no blood. 1.5cm papillary nodular (but soft) flesh colored exophytic lesion from left vaginal fornix. Does not palpably infiltrate towards rectum or involve paravaginal tissues.  Rectovaginal:  No rectal lesions, mass not appreciated via rectum, no paravaginal infiltration.    Thereasa Solo, MD  09/03/2018, 4:35 PM    Cc: Darron Doom, MD (Referring Ob/Gyn)

## 2018-09-03 NOTE — Patient Instructions (Signed)
Dr Denman George has diagnosed a recurrence of your cervical cancer in a small (1 inch) place in the vagina. She is recommending treatment with radiation and chemotherapy. She has made referrals to these doctors.  She will obtain a PET scan before treatment.  She will see you back after treatment has finished.

## 2018-09-07 ENCOUNTER — Telehealth: Payer: Self-pay | Admitting: Oncology

## 2018-09-07 NOTE — Telephone Encounter (Signed)
Mcarthur Rossetti, Medical Interpreter, to notify patient of appointment with Dr. Sondra Come on 09/16/18.

## 2018-09-13 NOTE — Progress Notes (Signed)
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 Denman George:  Vaginal lesion on exam today - at site of prior fistulous tract therefore likely granulation tissue. Will follow-up biopsy. If recurrent malignancy, would need radiation  Biopsies revealed: 08/27/18:  Diagnosis Vagina, biopsy, left upper vaginal side - wall - SQUAMOUS CELL CARCINOMA.  Past/Anticipated interventions by Gyn/Onc surgery, if any: 02/08/18:  Operation: Robotic-assisted type III radical laparoscopic hysterectomy with bilateral salpingoophorectomy and bilateral pelvic lymphadenectomy  Surgeon: Donaciano Eva    Past/Anticipated interventions by medical oncology, if any: Initial consult with Dr. Alvy Bimler 09/15/18 [id number]:Cisplatin 40 mg/m2 IV D1 q7 Days + RT:   A cycle is every 7 days:     Cisplatin   Weight changes, if any: No  Bowel/Bladder complaints, if any:  No leakage of urine from vagina. + sensation of incomplete emptying.    Nausea/Vomiting, if any: No  Pain issues, if any:  Pt reports inflammation feeling around the bladder and pt states she "has learned to live with it".  SAFETY ISSUES:  Prior radiation? No  Pacemaker/ICD? No  Possible current pregnancy? No  Is the patient on methotrexate? No  Current Complaints / other details:  Pt presents today for initial consult with Dr. Sondra Come. Pt is accompanied by hospital interpreter, Almyra Free.   Per Dr. Denman George 09/03/18:  Recommend pelvic RT with vaginal brachytherapy (at the discretion of Dr Sondra Come) with radiosensitizing CDDP. Will made referrals to med onc and rad onc  BP 121/78 (BP Location: Right Arm, Patient Position: Sitting)   Pulse 86   Temp 98.6 F (37 C) (Oral)   Resp 18   Ht 4\' 11"  (1.499 m)   Wt 147 lb (66.7 kg)   LMP 11/16/2012   SpO2 100%   BMI 29.69 kg/m   Wt Readings from Last 3 Encounters:  09/16/18 147 lb (66.7 kg)  09/15/18  146 lb 9.6 oz (66.5 kg)  09/03/18 146 lb (66.2 kg)   Loma Sousa, RN BSN

## 2018-09-14 ENCOUNTER — Ambulatory Visit (HOSPITAL_COMMUNITY)
Admission: RE | Admit: 2018-09-14 | Discharge: 2018-09-14 | Disposition: A | Discharge status: Home / Self Care | Payer: BLUE CROSS/BLUE SHIELD | Source: Ambulatory Visit | Attending: Gynecologic Oncology | Admitting: Gynecologic Oncology

## 2018-09-14 DIAGNOSIS — C539 Malignant neoplasm of cervix uteri, unspecified: Secondary | ICD-10-CM

## 2018-09-14 DIAGNOSIS — C7982 Secondary malignant neoplasm of genital organs: Secondary | ICD-10-CM | POA: Insufficient documentation

## 2018-09-14 LAB — GLUCOSE, CAPILLARY: Glucose-Capillary: 102 mg/dL — ABNORMAL HIGH (ref 70–99)

## 2018-09-14 MED ORDER — FLUDEOXYGLUCOSE F - 18 (FDG) INJECTION
7.2000 | Freq: Once | INTRAVENOUS | Status: AC | PRN
  Administered 2018-09-14: 7.2 via INTRAVENOUS

## 2018-09-15 ENCOUNTER — Telehealth: Payer: Self-pay | Admitting: Oncology

## 2018-09-15 ENCOUNTER — Telehealth: Payer: Self-pay | Admitting: Hematology and Oncology

## 2018-09-15 ENCOUNTER — Inpatient Hospital Stay: Payer: BLUE CROSS/BLUE SHIELD | Attending: Gynecologic Oncology | Admitting: Hematology and Oncology

## 2018-09-15 ENCOUNTER — Encounter: Payer: Self-pay | Admitting: Hematology and Oncology

## 2018-09-15 VITALS — BP 115/67 | HR 99 | Temp 98.8°F | Resp 18 | Ht 59.0 in | Wt 146.6 lb

## 2018-09-15 DIAGNOSIS — C73 Malignant neoplasm of thyroid gland: Secondary | ICD-10-CM | POA: Insufficient documentation

## 2018-09-15 DIAGNOSIS — E041 Nontoxic single thyroid nodule: Secondary | ICD-10-CM | POA: Diagnosis not present

## 2018-09-15 DIAGNOSIS — C531 Malignant neoplasm of exocervix: Secondary | ICD-10-CM

## 2018-09-15 DIAGNOSIS — C539 Malignant neoplasm of cervix uteri, unspecified: Secondary | ICD-10-CM | POA: Diagnosis not present

## 2018-09-15 DIAGNOSIS — K5909 Other constipation: Secondary | ICD-10-CM | POA: Insufficient documentation

## 2018-09-15 DIAGNOSIS — Z5111 Encounter for antineoplastic chemotherapy: Secondary | ICD-10-CM | POA: Insufficient documentation

## 2018-09-15 DIAGNOSIS — R339 Retention of urine, unspecified: Secondary | ICD-10-CM | POA: Diagnosis not present

## 2018-09-15 NOTE — Progress Notes (Signed)
START OFF PATHWAY REGIMEN - Other Dx   [id number]:Cisplatin 40 mg/m2 IV D1 q7 Days + RT:   A cycle is every 7 days:     Cisplatin   **Always confirm dose/schedule in your pharmacy ordering system**  Patient Characteristics: Intent of Therapy: Curative Intent, Discussed with Patient 

## 2018-09-15 NOTE — Telephone Encounter (Signed)
Left a message for Shauna, Financial Advocate to see if grant can be set up for the [place].

## 2018-09-15 NOTE — Telephone Encounter (Signed)
Called patients son regarding 2/21

## 2018-09-15 NOTE — Telephone Encounter (Signed)
Gave avs and calendar waiting for 2/21 approval for infussion

## 2018-09-16 ENCOUNTER — Ambulatory Visit: Payer: BLUE CROSS/BLUE SHIELD | Admitting: Hematology and Oncology

## 2018-09-16 ENCOUNTER — Other Ambulatory Visit: Payer: Self-pay

## 2018-09-16 ENCOUNTER — Ambulatory Visit
Admission: RE | Admit: 2018-09-16 | Discharge: 2018-09-16 | Disposition: A | Discharge status: Home / Self Care | Payer: BLUE CROSS/BLUE SHIELD | Source: Ambulatory Visit | Attending: Radiation Oncology | Admitting: Radiation Oncology

## 2018-09-16 ENCOUNTER — Encounter: Payer: Self-pay | Admitting: Oncology

## 2018-09-16 ENCOUNTER — Encounter: Payer: Self-pay | Admitting: Radiation Oncology

## 2018-09-16 ENCOUNTER — Encounter: Payer: Self-pay | Admitting: Hematology and Oncology

## 2018-09-16 VITALS — BP 121/78 | HR 86 | Temp 98.6°F | Resp 18 | Ht 59.0 in | Wt 147.0 lb

## 2018-09-16 DIAGNOSIS — K5909 Other constipation: Secondary | ICD-10-CM | POA: Insufficient documentation

## 2018-09-16 DIAGNOSIS — R7303 Prediabetes: Secondary | ICD-10-CM | POA: Insufficient documentation

## 2018-09-16 DIAGNOSIS — C539 Malignant neoplasm of cervix uteri, unspecified: Secondary | ICD-10-CM | POA: Diagnosis not present

## 2018-09-16 DIAGNOSIS — C531 Malignant neoplasm of exocervix: Secondary | ICD-10-CM

## 2018-09-16 DIAGNOSIS — R339 Retention of urine, unspecified: Secondary | ICD-10-CM | POA: Insufficient documentation

## 2018-09-16 NOTE — Progress Notes (Signed)
Met with patient along with Julie(interpreter) to advise what is needed to apply for the one-time $700 [place]. She verbalized understanding.  Asked about insurance and patient confirms she still has [place]. She has not been written any prescriptions as of yet but plans to use the [facility] OP pharmacy.She knows to bring proof of household income on 09/21/18 when she returns to apply for grant. Will also use income to apply for copay assistance through PAF if available at that time.  Patient will contact me through [place] with any additional financial questions or concerns.

## 2018-09-16 NOTE — Progress Notes (Signed)
Radiation Oncology         [phone] ________________________________  Initial Outpatient Consultation  Name: Amy Morrow MRN: [id number]  Date: 09/16/2018  DOB: 1961-06-20  LJ:QGBE, Ander Gaster, MD  [name], MD   REFERRING PHYSICIAN: [name], MD  DIAGNOSIS: The encounter diagnosis was Recurrent cervical cancer [facility]).  HISTORY OF PRESENT ILLNESS::Amy Morrow is a 58 y.o. female who is accompanied by our hospital interpreter, Almyra Free. The patient presented with post-menopausal bleeding to her OBGYN on 12/07/2017 and was noted to have a 3 cm cervical mass. Biopsy on 01/12/2018 showed invasive squamous cell carcinoma. She opted to undergo radical hysterectomy on 02/08/2018. Pathology from the procedure revealed: invasive moderately differentiated squamous cell carcinoma, 2.6 cm, involving anterior portion of cervix; depth of 0.5 cm, confined to cervix; all lymph nodes negative, 0/20.  Following the procedure, she experienced ureterovaginal fistulae and vaginal drainage with urinary leakage. She underwent right percutaneous nephrostomy tube placement on 03/02/2018, but she experienced sepsis and little improvement. Placement of a left PCN was performed on 03/18/2018 which greatly improved the urinary leakage from the vagina. On 06/24/18 Dr Louis Meckel performed robotic assisted bilateral ureteral reimplantation (ureteroneocystotomy). She did well postop.   Unfortunately, at a recent visit with Dr. Denman George on 08/27/2018, a 1.5 cm papillary nodular lesion was noted from the left vaginal fornix (site of prior fistulous tract). Biopsy from that day revealed squamous cell carcinoma. Prior this the patient had noticed some mild vaginal bleeding  Most recent PET scan on 09/14/2018 showed no metastatic adenopathy in abdomen or pelvis, no soft tissue metastasis, known local vaginal cuff carcinoma recurrence, hypermetabolic nodule in right lobe of thyroid gland. She is scheduled for thyroid biopsy on  09/23/2018.  She has kindly been referred to [place] for consideration of radiation therapy. She met with Dr. Alvy Bimler yesterday, 09/15/2018.   PREVIOUS RADIATION THERAPY: No  PAST MEDICAL HISTORY:  has a past medical history of Abnormal Pap smear, Anemia, Cervical cancer ([place]) (oncologist- dr Denman George), Dyspnea, Fatigue, Hyperlipidemia, Left breast mass (2013), PONV (postoperative nausea and vomiting), Pre-diabetes, Sepsis ([place]) (03/2018), Thyroid nodule (03/25/2018), Ureterovaginal fistula, Urgency of urination, and Urinary frequency.    PAST SURGICAL HISTORY: Past Surgical History:  Procedure Laterality Date  . ABDOMINAL HYSTERECTOMY    . BREAST BIOPSY Left 06/15/2012  . CYSTOSCOPY W/ RETROGRADES Bilateral 02/16/2018   Procedure: CYSTOSCOPY WITH RETROGRADE PYELOGRAM BILATERAL DIAGNOSTIC URETEROSCOPY, BILATERAL  STENT PLACEMENT;  Surgeon: Ardis Hughs, MD;  Location: [facility];  Service: Urology;  Laterality: Bilateral;  . CYSTOSCOPY W/ URETERAL STENT PLACEMENT Bilateral 04/22/2018   Procedure: CYSTOSCOPY WITH BILATERAL RETROGRADE PYELOGRAM/ AND URETERAL STENT EXCHANGE, vaginal exam;  Surgeon: Ardis Hughs, MD;  Location[facility];  Service: Urology;  Laterality: Bilateral;  . CYSTOSCOPY W/ URETERAL STENT PLACEMENT Bilateral 05/28/2018   Procedure: CYSTOSCOPY WITH BILATERAL  RETROGRADE PYELOGRAM/URETERAL STENT REPLACEMENT, REMOVAL OF BILATERAL PERCUTANEOUS DRAINS;  Surgeon: Ardis Hughs, MD;  Location: [facility];  Service: Urology;  Laterality: Bilateral;  . D & C LEEP CONIZATION BX  07-31-2003   dr p. rose [facility]  . IR NEPHROSTOGRAM RIGHT THRU EXISTING ACCESS  03/18/2018  . IR NEPHROSTOMY EXCHANGE LEFT  04/27/2018  . IR NEPHROSTOMY EXCHANGE RIGHT  04/27/2018  . IR NEPHROSTOMY PLACEMENT LEFT  03/18/2018  . IR NEPHROSTOMY PLACEMENT RIGHT  03/02/2018  . PELVIC LYMPH NODE DISSECTION Bilateral 02/08/2018   Procedure: BILATEAL PEVIC  LYMPHADENECTOMY;  Surgeon:  [name], MD;  Location: [facility];  Service:  Gynecology;  Laterality: Bilateral;  . ROBOTIC ASSISTED TOTAL HYSTERECTOMY WITH BILATERAL SALPINGO OOPHERECTOMY N/A 02/08/2018   Procedure: XI ROBOTIC ASSISTED TOTAL Radical HYSTERECTOMY WITH BILATERAL SALPINGO OOPHORECTOMY;  Surgeon: [name], MD;  Location: [facility];  Service: Gynecology;  Laterality: N/A;  . Transvaginal tape placement  03-12-2009  dr Emeterio Reeve  [facility]   GYNECARE TENSION-FREE VAGINAL TAPE SLING  . TUBAL LIGATION  05-28-2005  DR  MARSHALL  @ [facility]   PPTL    FAMILY HISTORY: family history includes Heart disease in her brother and mother; Hypertension in her brother and mother.  SOCIAL HISTORY:  reports that she has never smoked. She has never used smokeless tobacco. She reports that she does not drink alcohol or use drugs.  ALLERGIES: Patient has no known allergies.  MEDICATIONS:  Current Outpatient Medications  Medication Sig Dispense Refill  . acetaminophen (TYLENOL) 500 MG tablet Take 2 tablets (1,000 mg total) by mouth every 6 (six) hours as needed for moderate pain, fever or headache. 30 tablet 0   No current facility-administered medications for this encounter.     REVIEW OF SYSTEMS:  A 10+ POINT REVIEW OF SYSTEMS WAS OBTAINED including neurology, dermatology, psychiatry, cardiac, respiratory, lymph, extremities, GI, GU, musculoskeletal, constitutional, reproductive, HEENT. She states her appetite has been good, and she has been gaining weight back after losing weight following her recent procedures. She denies any swelling following the surgeries. She continues to feel like she's not completely emptying her bladder, but she states she can complete emptying with repositioning. She denies nausea or vomiting and any other symptoms.   PHYSICAL EXAM:  height is '4\' 11"'  (1.499 m) and weight is 147 lb (66.7 kg). Her oral temperature is 98.6 F (37 C). Her blood pressure is 121/78 and her pulse is 86. Her respiration is 18 and oxygen  saturation is 100%.   General: Alert and oriented, in no acute distress HEENT: Head is normocephalic. Extraocular movements are intact. Oropharynx is clear. Neck: Neck is supple, no palpable cervical or supraclavicular lymphadenopathy. Heart: Regular in rate and rhythm with no murmurs, rubs, or gallops. Chest: Clear to auscultation bilaterally, with no rhonchi, wheezes, or rales. Scars on right and left flank areas from prior nephrostomy tubes with no signs of infection. Abdomen: Soft, nontender, nondistended, with no rigidity or guarding. Extremities: No cyanosis or edema. Lymphatics: see Neck Exam Skin: No concerning lesions. Musculoskeletal: symmetric strength and muscle tone throughout. Neurologic: Cranial nerves II through XII are grossly intact. No obvious focalities. Speech is fluent. Coordination is intact. Psychiatric: Judgment and insight are intact. Affect is appropriate. On pelvic examination the external genitalia were unremarkable. A speculum exam was performed.  A 1.5cm papillary nodular exophytic lesion noted in the left vaginal fornix. On bimanual examination and rectovaginal examination no other pelvic masses appreciated.  ECOG = 1  0 - Asymptomatic (Fully active, able to carry on all predisease activities without restriction)  1 - Symptomatic but completely ambulatory (Restricted in physically strenuous activity but ambulatory and able to carry out work of a light or sedentary nature. For example, light housework, office work)  2 - Symptomatic, <50% in bed during the day (Ambulatory and capable of all self care but unable to carry out any work activities. Up and about more than 50% of waking hours)  3 - Symptomatic, >50% in bed, but not bedbound (Capable of only limited self-care, confined to bed or chair 50% or more of waking hours)  4 - Bedbound (Completely disabled. Cannot carry  on any self-care. Totally confined to bed or chair)  5 - Death   Eustace Pen MM, Creech RH,  Tormey DC, et al. [phone]). "Toxicity and response criteria of the [facility]". [place] Oncol. 5 (6): 649-55  LABORATORY DATA:  Lab Results  Component Value Date   WBC 7.1 06/29/2018   HGB 9.3 (L) 06/29/2018   HCT 29.3 (L) 06/29/2018   MCV 85.4 06/29/2018   PLT 478 (H) 06/29/2018   NEUTROABS 4.6 06/16/2018   Lab Results  Component Value Date   NA 136 06/29/2018   K 4.2 06/29/2018   CL 103 06/29/2018   CO2 23 06/29/2018   GLUCOSE 111 (H) 06/29/2018   CREATININE 0.68 06/29/2018   CALCIUM 8.9 06/29/2018      RADIOGRAPHY: Nm Pet Image Restag (ps) Skull Base To Thigh  Result Date: 09/14/2018 CLINICAL DATA:  Subsequent treatment strategy for cervical cancer. Recurrent cervical cancer. EXAM: NUCLEAR MEDICINE PET SKULL BASE TO THIGH TECHNIQUE: 7.2 mCi F-18 FDG was injected intravenously. Full-ring PET imaging was performed from the skull base to thigh after the radiotracer. CT data was obtained and used for attenuation correction and anatomic localization. Fasting blood glucose: 102 mg/dl COMPARISON:  PET-CT 02/03/2018.  CT '11 13 19 ' FINDINGS: Mediastinal blood pool activity: SUV max 2.1 NECK: No hypermetabolic lymph nodes in the neck. Focal hypermetabolic nodule in the posterior aspect RIGHT thyroid gland again noted and unchanged. Findings consistent with thyroid adenoma versus thyroid carcinoma. Incidental CT findings: none CHEST: No hypermetabolic mediastinal or hilar nodes. No suspicious pulmonary nodules on the CT scan. Incidental CT findings: none ABDOMEN/PELVIS: There is focal activity along the LEFT side of the vaginal cuff which is difficult to tease out on the background of the intense radiotracer activity within bladder. No hypermetabolic pelvic lymph nodes. No hypermetabolic periaortic lymph nodes. No abnormal activity in liver. Incidental CT findings: Post hysterectomy anatomy. No hydronephrosis hydroureter. SKELETON: No focal hypermetabolic activity to  suggest skeletal metastasis. Incidental CT findings: none IMPRESSION: 1. No evidence of metastatic adenopathy in the abdomen or pelvis. No soft tissue metastasis. 2. Focal activity in the LEFT aspect of vaginal cuff consistent with local carcinoma recurrence. 3. Hypermetabolic nodule in the RIGHT lobe of thyroid gland. Differential includes thyroid adenoma versus thyroid carcinoma. Consider thyroid ultrasound. Electronically Signed   By: Suzy Bouchard M.D.   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 Patient appears to have a isolated lesion in the left vaginal fornix. PET scan as above shows no other sites of disease. Patient would be a good candidate for definitive course of radiation therapy including external beam radiation therapy and intracavitary brachytherapy treatments. She would also likely benefit from radiosensitizing chemotherapy during her external beam treatments.  Today, I talked to the patient about the findings and work-up thus far with the assistance of an interpreter. We discussed the natural history of cervical cancer and general treatment with recurrence, highlighting the role of radiotherapy in the management.  We discussed the available radiation techniques, and focused on the details of logistics and delivery.  We reviewed the anticipated acute and late sequelae associated with radiation in this setting.  The patient was encouraged to ask questions that I answered to the best of my ability.  A patient consent form was discussed and signed.  We retained a copy for our records.  The patient would like to proceed with radiation and will be scheduled for CT simulation.  PLAN: She is scheduled for CT simulation on Tuesday, 09/21/2018 at 11 am. Patient will proceed with 5 weeks of external beam radiotherapy directed at the pelvic region along with radiosensitizing chemotherapy. I am recommending intensity-modulated radiotherapy, in light of her  prior hysterectomy and to lower the dose to small bowel. Patient will then proceed with 4 intracavity brachytherapy treatments directed at the proximal vaginal, the location of her recurrence.    ------------------------------------------------  Blair Promise, PhD, MD  This document serves as a record of services personally performed by Gery Pray, MD. It was created on his behalf by Wilburn Mylar, a trained medical scribe. The creation of this record is based on the scribe's personal observations and the provider's statements to them. This document has been checked and approved by the attending provider.

## 2018-09-16 NOTE — Assessment & Plan Note (Signed)
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 We will continue conservative management

## 2018-09-16 NOTE — Assessment & Plan Note (Signed)
We discussed laxative therapy. 

## 2018-09-16 NOTE — Progress Notes (Signed)
[place] CONSULT NOTE  Patient Care Team: Antony Blackbird, MD as PCP - General (Family Medicine)  ASSESSMENT & PLAN:  Cervical cancer [facility]) The patient has recurrence of disease but thankfully, based on recent PET imaging, she does not have diffuse metastatic disease I plan to give her weekly cisplatin along with concurrent radiation treatment I recommend chemo education class, chemotherapy consent and baseline blood work before we start treatment We discussed briefly the expected side effects of treatment and she agreed with the plan of care  Thyroid nodule She is noted to have persistent right thyroid nodule on imaging studies We discussed the importance of getting biopsy and she agreed to proceed  Other constipation We discussed laxative therapy.  Urinary retention She has mild chronic urinary retention due to prior history of bladder surgery. We will continue conservative management   Orders Placed This Encounter  Procedures  . IR IMAGING GUIDED PORT INSERTION    Standing Status:   Future    Standing Expiration Date:   11/14/2019    Order Specific Question:   Reason for Exam (SYMPTOM  OR DIAGNOSIS REQUIRED)    Answer:   need port to start chemo on 2/21    Order Specific Question:   Is the patient pregnant?    Answer:   No    Order Specific Question:   Preferred Imaging Location?    Answer:   [facility]  . [place] FNA BX THYROID 1ST LESION AFIRMA    Afirma/Ins-BCBS Wt 146 lbs/Needs Spanish Interpreter/PF 03/25/18 @ [facility]/No Blood Thinners/Cosign req/BC [facility]  Highly suspicious nodule in the inferior right thyroid lobe corresponds with the hypermetabolic activity on the PET-CT. Recommend ultrasound-guided biopsy of this right thyroid nodule.    Standing Status:   Future    Standing Expiration Date:   11/14/2019    Order Specific Question:   Reason for Exam (SYMPTOM  OR DIAGNOSIS REQUIRED)    Answer:   thyroid lesion    Order Specific  Question:   Preferred imaging location?    Answer:   [facility]  . CBC with Differential/Platelet    Standing Status:   Standing    Number of Occurrences:   22    Standing Expiration Date:   09/16/2019  . Comprehensive metabolic panel    Standing Status:   Standing    Number of Occurrences:   22    Standing Expiration Date:   09/16/2019  . 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 female is here because of recurrent cervical cancer.  Spanish interpreter is present. The patient stays at home and looks after her 3 children.  She lives with her husband. The patient was diagnosed with abnormal Pap smear dated back several years ago. In 2019, she underwent surgery.  She is known to have fistula after surgery but that has subsequently healed. Currently, she has sensation of incomplete bladder emptying but denies urinary frequency, urgency or dysuria.  She denies urinary incontinence. She had no sexual activity for some time.  Denies abnormal vaginal discharge or bleeding  I have reviewed her chart and materials related to her cancer extensively and collaborated history with the patient. Summary  of oncologic history is as follows: Oncology History   Recurrent cervical cancer to the vagina HPV positive from 2019 specimen     Cervical cancer ([place])   06/11/2011 Pathology Results    1. Endocervix, curettage DETACHED FRAGMENTS OF SQUAMOUS MUCOSA WITH KOILOCYTIC ATYPIA, SEE COMMENT. 2. Cervix, biopsy LOW GRADE SQUAMOUS INTRAEPITHELIAL LESION, CIN-I (MILD DYSPLASIA), SEE COMMENT. Microscopic Comment 1. 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 2. The findings    01/09/2012 Pathology Results    LOW GRADE  SQUAMOUS INTRAEPITHELIAL LESION: CIN-1/ HPV (LSIL). ENDOMETRIAL CELLS ARE PRESENT.    01/12/2018 Pathology Results    Cervix, biopsy - INVASIVE SQUAMOUS CELL CARCINOMA - SEE COMMENT    02/08/2018 Pathology Results    1. Lymph nodes, regional resection, right pelvic - EIGHT LYMPH NODES, NEGATIVE FOR CARCINOMA (0/8). 2. Lymph nodes, regional resection, left pelvic - TWELVE LYMPH NODES, NEGATIVE FOR CARCINOMA (0/12). 3. Uterus +/- tubes/ovaries, neoplastic, cervix, bilateral tubes and ovaries, upper third of vagina - INVASIVE MODERATELY DIFFERENTIATED SQUAMOUS CELL CARCINOMA, 2.6 CM, INVOLVING ANTERIOR PORTION OF CERVIX FROM 9 O'CLOCK TO 3 O'CLOCK. - TUMOR INVADES FOR DEPTH OF 0.5 CM AND IS CONFINED TO THE CERVIX. - ALL RESECTION MARGINS ARE NEGATIVE FOR CARCINOMA; THE CLOSEST IS THE VAGINAL CUFF MARGIN AT 1 CM. - NEGATIVE FOR LYMPHOVASCULAR OR PERINEURAL INVASION. - BENIGN ENDOMETRIAL POLYP, 2.6 CM. - BENIGN LEIOMYOMA, 2.2 CM - BENIGN INACTIVE ENDOMETRIUM. - BENIGN BILATERAL OVARIES AND FALLOPIAN TUBES. - SEE ONCOLOGY TABLE. Microscopic Comment 3. UTERINE CERVIX: Resection Procedure: Radial hysterectomy. Tumor Size: 2.6 cm. Histologic Type: Squamous cell carcinoma. Histologic Grade: G2: Moderately differentiated. Stromal Invasion: Depth of stromal invasion (millimeters): 5 mm. Horizontal extent longitudinal/length (if applicable#) (millimeters): 20 mm. Horizontal extent circumferential/width (if applicable#) (millimeters): 26 mm. Other Tissue/ Organ: Not involved. Margins: Negative for carcinoma. Lymphovascular Invasion: Not identified. Regional Lymph Nodes: All lymph nodes negative for tumor cells Total Number of Lymph Nodes Examined: 20. Number of Sentinel Nodes Examined (if applicable): 0. Pathologic Stage Classification (pTNM, AJCC 8th Edition): pT1b1, pN0. Ancillary Studies: Not applicable. Representative Tumor Block: 3E, 74F, and 3G.    02/08/2018 Surgery    Operation:  Robotic-assisted type III radical laparoscopic hysterectomy with bilateral salpingoophorectomy and bilateral pelvic lymphadenectomy  Surgeon: Donaciano Eva  Operative Findings:  : 2-3cm pedunculated exophytic mass from right anterior cervix. No clinical involvement of the parametria, no suspicious nodes    02/16/2018 Surgery    Procedure: 1. Cystoscopy, bilateral retrograde pyelograms with interpretation 2. Diagnostic ureteroscopy 3. Bilateral ureteral stent placement  Surgeon: Ardis Hughs, MD  Intraoperative findings:  #1: The retrograde pyelogram on the patient's right side initially demonstrated significant contrast extravasation several centimeters from the ureteral orifice.  There was proximal hydroureteronephrosis. #2: Ureteroscopy of the right ureter demonstrated a full-thickness ureteral injury likely thermal in nature approximately 2-1/2 cm from the UVJ. #3: The retrograde on the patient's left side demonstrated severe extravasation with no contrast getting beyond the first centimeter of the ureter.  Once beyond this area with a ureteroscope the ureter was mild to moderately dilated. 4.:  Ureteroscopy of the left ureter demonstrated an area approximately 2 cm from the UVJ and lasting approximately a centimeter and a half of devitalized tissue, full-thickness, likely thermal in nature. #5: 5 French x22 cm Polaris stents were placed bilaterally.     03/22/2018 Imaging    Highly suspicious nodule in the inferior right thyroid lobe corresponds with the hypermetabolic activity on the PET-CT. Recommend ultrasound-guided biopsy  of this right thyroid nodule.  No other thyroid nodules.    04/22/2018 Surgery    Procedure: 4. Cystoscopy 5. Bilateral retrograde pyelogram with interpretation 6. Bilateral ureteral stent exchange 7. Left ureteroscopy, diagnostic  Surgeon: Ardis Hughs, MD  Intraoperative findings:  #1: On speculum exam there was a tine  from the right ureteral stent (Polaris) noted at the vaginal cuff.  The remainder of the vaginal cuff was healed, including the area around the ureteral stent tine. 2.:  The right retrograde pyelogram was performed using 10 cc of Omni, and demonstrated normal caliber ureter with no significant hydroureteronephrosis.  There did not appear to be any communication or extravasation of contrast in the distal ureter or UVJ with the vagina. 3.:  The left retrograde pyelogram demonstrated a small area of extravasation at the UVJ, nearly the intramural ureter, that seemed to communicate with the patient's vagina.  There were no other significant filling defects, there is no hydroureteronephrosis. 4.:  Left diagnostic ureteroscopy demonstrated an abnormality within the intramural or very distal UVJ region where there was some bullous edema and likely the site of the fistula.  There was no identifiable communication.  The remaining aspect of the ureter was normal.    05/28/2018 Surgery    Procedure: 8. Bilateral retrograde pyelogram with interpretation 9. Bilateral diagnostic ureteroscopy 10. Bilateral ureteral stent exchange 11. Bilateral nephrostomy tube removal  Surgeon: Ardis Hughs, MD  Intraoperative findings:  #1: 5 French open-ended ureteral catheter was used to perform a retrograde pyelogram in the patient's left ureter which demonstrated normal caliber ureter with no hydro-.  However, there was a narrowed segment at the intramural ureter and just at the UVJ with a prominent fistula draining into the vagina. #2: I removed the wire to see the how well the ureter with drain, it did not drain well at all.  As such I tried to repassed the wire and ultimately had to pass a ureteroscope through the distal ureter in order to get the wire up into the left renal pelvis.  This noted distorted and abnormal mucosa in the intravesical and ureterovesical junction. #3: The patient's right sided ureteral  stent distal had migrated into the patient's vagina.  In order to remove the stent I had to perform ureteroscopy on the right ureter cannulating the right ureteral orifice and advancing it through the intramural ureter and beyond the distal ureter.  Once I was up beyond the fistula I passed the wire through the scope and into the right renal pelvis.  Once the wire was in the pelvis I removed the scope and pulled the stent through the patient's vagina. 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 #5: A 24 cm x 6 French double-J ureteral stent was placed in the right ureter. #6: A 22 cm x 5 French Polaris catheter was placed in the patient's left ureter    07/02/2018 Surgery    Procedure: 1. Robotic assisted laparoscopic bilateral ureteral reimplant  Surgeon: Ardis Hughs, MD First Assistant: Dr. [name], MD  Intraoperative findings:  #1: The patient's ureters were thickened, and adherent to the sidewalls, but there were able to be reimplanted without any tension. #2: The patient had a mid urethral sling mesh noted around the suprapubic bone that was unmolested.    08/27/2018 Pathology Results    Vagina, biopsy, left upper  vaginal side - wall - SQUAMOUS CELL CARCINOMA.    09/15/2018 Cancer Staging    Staging form: Cervix Uteri, AJCC 7th Edition - Pathologic: FIGO Stage IB1 (T1b1, N0, cM0) - Signed by Heath Lark, MD on 09/16/2018    She denies pelvic pain.  She has some chronic constipation She was noted to have incidental finding on prior PET CT scan with abnormal thyroid findings including abnormal thyroid ultrasound.  Thyroid biopsy was recommended but the patient did not get scheduled for the biopsy. Her disease is noted to be HPV positive. Her baseline screening for HIV antibody and hepatitis C were negative in  2019.  MEDICAL HISTORY:  Past Medical History:  Diagnosis Date  . Abnormal Pap smear   . Anemia   . Cervical cancer [facility]) oncologist- dr Denman George   Stage IB1  SCCa   . Dyspnea   . Fatigue   . Hyperlipidemia   . Left breast mass 2013   7x4x4 mm 6o'clock  . PONV (postoperative nausea and vomiting)   . Pre-diabetes   . Sepsis ([place]) 03/2018   Urosepsis, Resolved  . Thyroid nodule 03/25/2018   inferior right thyroid, noted on US thyroid  . Ureterovaginal fistula   . Urgency of urination    intermittant  . Urinary frequency     SURGICAL HISTORY: Past Surgical History:  Procedure Laterality Date  . ABDOMINAL HYSTERECTOMY    . BREAST BIOPSY Left 06/15/2012  . CYSTOSCOPY W/ RETROGRADES Bilateral 02/16/2018   Procedure: CYSTOSCOPY WITH RETROGRADE PYELOGRAM BILATERAL DIAGNOSTIC URETEROSCOPY, BILATERAL  STENT PLACEMENT;  Surgeon: Ardis Hughs, MD;  Location: [facility];  Service: Urology;  Laterality: Bilateral;  . CYSTOSCOPY W/ URETERAL STENT PLACEMENT Bilateral 04/22/2018   Procedure: CYSTOSCOPY WITH BILATERAL RETROGRADE PYELOGRAM/ AND URETERAL STENT EXCHANGE, vaginal exam;  Surgeon: Ardis Hughs, MD;  Location: [facility];  Service: Urology;  Laterality: Bilateral;  . CYSTOSCOPY W/ URETERAL STENT PLACEMENT Bilateral 05/28/2018   Procedure: CYSTOSCOPY WITH BILATERAL  RETROGRADE PYELOGRAM/URETERAL STENT REPLACEMENT, REMOVAL OF BILATERAL PERCUTANEOUS DRAINS;  Surgeon: Ardis Hughs, MD;  Location: [facility];  Service: Urology;  Laterality: Bilateral;  . D & C LEEP CONIZATION BX  07-31-2003   dr p. rose [facility]  . IR NEPHROSTOGRAM RIGHT THRU EXISTING ACCESS  03/18/2018  . IR NEPHROSTOMY EXCHANGE LEFT  04/27/2018  . IR NEPHROSTOMY EXCHANGE RIGHT  04/27/2018  . IR NEPHROSTOMY PLACEMENT LEFT  03/18/2018  . IR NEPHROSTOMY PLACEMENT RIGHT  03/02/2018  . PELVIC LYMPH NODE DISSECTION Bilateral 02/08/2018   Procedure: BILATEAL PEVIC  LYMPHADENECTOMY;  Surgeon:  [name], MD;  Location: [facility];  Service: Gynecology;  Laterality: Bilateral;  . ROBOTIC ASSISTED TOTAL HYSTERECTOMY WITH BILATERAL SALPINGO OOPHERECTOMY N/A 02/08/2018   Procedure: XI ROBOTIC ASSISTED TOTAL Radical HYSTERECTOMY WITH BILATERAL SALPINGO OOPHORECTOMY;  Surgeon: [name], MD;  Location: [facility];  Service: Gynecology;  Laterality: N/A;  . Transvaginal tape placement  03-12-2009  dr Emeterio Reeve  [facility]   GYNECARE TENSION-FREE VAGINAL TAPE SLING  . TUBAL LIGATION  05-28-2005  DR  MARSHALL  @ [facility]   PPTL    SOCIAL HISTORY: Social History   Socioeconomic History  . Marital status: Married    Spouse name: Not on file  . Number of children: Not on file  . Years of education: Not on file  . Highest education level: Not on file  Occupational History  . Not on file  Social Needs  . Financial resource strain: Not on file  .  Food insecurity:    Worry: Not on file    Inability: Not on file  . Transportation needs:    Medical: Not on file    Non-medical: Not on file  Tobacco Use  . Smoking status: Never Smoker  . Smokeless tobacco: Never Used  Substance and Sexual Activity  . Alcohol use: No  . Drug use: No  . Sexual activity: Yes    Birth control/protection: Surgical  Lifestyle  . Physical activity:    Days per week: Not on file    Minutes per session: Not on file  . Stress: Not on file  Relationships  . Social connections:    Talks on phone: Not on file    Gets together: Not on file    Attends religious service: Not on file    Active member of club or organization: Not on file    Attends meetings of clubs or organizations: Not on file    Relationship status: Not on file  . Intimate partner violence:    Fear of current or ex partner: Not on file    Emotionally abused: Not on file    Physically abused: Not on file    Forced sexual activity: Not on file  Other Topics Concern  . Not on file  Social History Narrative  . Not on file    FAMILY HISTORY: Family  History  Problem Relation Age of Onset  . Hypertension Mother   . Heart disease Mother   . Hypertension Brother   . Heart disease Brother     ALLERGIES:  has No Known Allergies.  MEDICATIONS:  Current Outpatient Medications  Medication Sig Dispense Refill  . acetaminophen (TYLENOL) 500 MG tablet Take 2 tablets (1,000 mg total) by mouth every 6 (six) hours as needed for moderate pain, fever or headache. 30 tablet 0   No current facility-administered medications for this visit.     REVIEW OF SYSTEMS:   Constitutional: Denies fevers, chills or abnormal night sweats Eyes: Denies blurriness of vision, double vision or watery eyes Ears, nose, mouth, throat, and face: Denies mucositis or sore throat Respiratory: Denies cough, dyspnea or wheezes Cardiovascular: Denies palpitation, chest discomfort or lower extremity swelling Gastrointestinal:  Denies nausea, heartburn or change in bowel habits Skin: Denies abnormal skin rashes Lymphatics: Denies new lymphadenopathy or easy bruising Neurological:Denies numbness, tingling or new weaknesses Behavioral/Psych: Mood is stable, no new changes  All other systems were reviewed with the patient and are negative.  PHYSICAL EXAMINATION: ECOG PERFORMANCE STATUS: 1 - Symptomatic but completely ambulatory  Vitals:   09/15/18 0917  BP: 115/67  Pulse: 99  Resp: 18  Temp: 98.8 F (37.1 C)  SpO2: 100%   Filed Weights   09/15/18 0917  Weight: 146 lb 9.6 oz (66.5 kg)    GENERAL:alert, no distress and comfortable SKIN: skin color, texture, turgor are normal, no rashes or significant lesions EYES: normal, conjunctiva are pink and non-injected, sclera clear OROPHARYNX:no exudate, no erythema and lips, buccal mucosa, and tongue normal  NECK: supple, thyroid normal size, non-tender, without nodularity LYMPH:  no palpable lymphadenopathy in the cervical, axillary or inguinal LUNGS: clear to auscultation and percussion with normal breathing  effort HEART: regular rate & rhythm and no murmurs and no lower extremity edema ABDOMEN:abdomen soft, non-tender and normal bowel sounds Musculoskeletal:no cyanosis of digits and no clubbing  PSYCH: alert & oriented x 3 with fluent speech NEURO: no focal motor/sensory deficits  LABORATORY DATA:  I have reviewed the data as listed  Lab Results  Component Value Date   WBC 7.1 06/29/2018   HGB 9.3 (L) 06/29/2018   HCT 29.3 (L) 06/29/2018   MCV 85.4 06/29/2018   PLT 478 (H) 06/29/2018   Recent Labs    03/19/18 0415  05/04/18 1657  06/16/18 1003  06/26/18 1133 06/27/18 0527 06/29/18 0641  NA 138   < > 140   < > 136   < > 137 136 136  K 3.7   < > 3.8   < > 3.4*   < > 3.5 3.6 4.2  CL 103   < > 100   < > 100   < > 102 103 103  CO2 28  --  26  --  28   < > 26 24 23   GLUCOSE 99   < > 120*   < > 123*   < > 110* 85 111*  BUN 11   < > 11   < > 16   < > 11 10 11   CREATININE 0.58   < > 0.50*   < > 0.74   < > 0.75 0.59 0.68  CALCIUM 8.8*  --  9.0  --  9.3   < > 8.3* 8.1* 8.9  GFRNONAA >60  --  109  --  >60   < > >60 >60 >60  GFRAA >60  --  126  --  >60   < > >60 >60 >60  PROT 6.6  --  7.3  --  8.6*  --   --   --   --   ALBUMIN 3.2*  --  4.3  --  4.4  --   --   --   --   AST 18  --  13  --  18  --   --   --   --   ALT 13  --  12  --  13  --   --   --   --   ALKPHOS 77  --  102  --  97  --   --   --   --   BILITOT 0.8  --  <0.2  --  0.6  --   --   --   --    < > = values in this interval not displayed.    RADIOGRAPHIC STUDIES: I have reviewed multiple imaging studies with the patient I have personally reviewed the radiological images as listed and agreed with the findings in the report. Nm Pet Image Restag (ps) Skull Base To Thigh  Result Date: 09/14/2018 CLINICAL DATA:  Subsequent treatment strategy for cervical cancer. Recurrent cervical cancer. EXAM: NUCLEAR MEDICINE PET SKULL BASE TO THIGH TECHNIQUE: 7.2 mCi F-18 FDG was injected intravenously. Full-ring PET imaging was performed  from the skull base to thigh after the radiotracer. CT data was obtained and used for attenuation correction and anatomic localization. Fasting blood glucose: 102 mg/dl COMPARISON:  PET-CT 02/03/2018.  CT 11 13 19  FINDINGS: Mediastinal blood pool activity: SUV max 2.1 NECK: No hypermetabolic lymph nodes in the neck. Focal hypermetabolic nodule in the posterior aspect RIGHT thyroid gland again noted and unchanged. Findings consistent with thyroid adenoma versus thyroid carcinoma. Incidental CT findings: none CHEST: No hypermetabolic mediastinal or hilar nodes. No suspicious pulmonary nodules on the CT scan. Incidental CT findings: none ABDOMEN/PELVIS: There is focal activity along the LEFT side of the vaginal cuff which is difficult to tease out on the background of the intense radiotracer activity within bladder.  No hypermetabolic pelvic lymph nodes. No hypermetabolic periaortic lymph nodes. No abnormal activity in liver. Incidental CT findings: Post hysterectomy anatomy. No hydronephrosis hydroureter. SKELETON: No focal hypermetabolic activity to suggest skeletal metastasis. Incidental CT findings: none IMPRESSION: 1. No evidence of metastatic adenopathy in the abdomen or pelvis. No soft tissue metastasis. 2. Focal activity in the LEFT aspect of vaginal cuff consistent with local carcinoma recurrence. 3. Hypermetabolic nodule in the RIGHT lobe of thyroid gland. Differential includes thyroid adenoma versus thyroid carcinoma. Consider thyroid ultrasound. Electronically Signed   By: Suzy Bouchard M.D.   On: 09/14/2018 09:10    I spent 55 minutes counseling the patient face to face. The total time spent in the appointment was 60 minutes and more than 50% was on counseling.  All questions were answered. The patient knows to call the clinic with any problems, questions or concerns.  Heath Lark, MD 09/16/2018 3:28 PM

## 2018-09-16 NOTE — Assessment & Plan Note (Signed)
The patient has recurrence of disease but thankfully, based on recent PET imaging, she does not have diffuse metastatic disease I plan to give her weekly cisplatin along with concurrent radiation treatment I recommend chemo education class, chemotherapy consent and baseline blood work before we start treatment We discussed briefly the expected side effects of treatment and she agreed with the plan of care

## 2018-09-16 NOTE — Assessment & Plan Note (Signed)
She is noted to have persistent right thyroid nodule on imaging studies We discussed the importance of getting biopsy and she agreed to proceed

## 2018-09-21 ENCOUNTER — Other Ambulatory Visit: Payer: Self-pay | Admitting: Radiology

## 2018-09-21 ENCOUNTER — Ambulatory Visit
Admission: RE | Admit: 2018-09-21 | Discharge: 2018-09-21 | Disposition: A | Discharge status: Home / Self Care | Payer: BLUE CROSS/BLUE SHIELD | Source: Ambulatory Visit | Attending: Radiation Oncology | Admitting: Radiation Oncology

## 2018-09-21 ENCOUNTER — Encounter: Payer: Self-pay | Admitting: Hematology and Oncology

## 2018-09-21 DIAGNOSIS — C539 Malignant neoplasm of cervix uteri, unspecified: Secondary | ICD-10-CM | POA: Insufficient documentation

## 2018-09-21 DIAGNOSIS — Z510 Encounter for antineoplastic radiation therapy: Secondary | ICD-10-CM | POA: Diagnosis not present

## 2018-09-21 NOTE — Progress Notes (Signed)
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  Patient approved and was given a copy of the approval letter as well as the expense sheet and Outpatient pharmacy information. Expenses were explained by interpreter.  Advised if interested in applying for copay assistance through [employer], exact income such as spouse taxes would be needed to apply. She verbalized understanding and will bring.  She has my card for any additional financial questions or concerns.

## 2018-09-22 ENCOUNTER — Telehealth: Payer: Self-pay | Admitting: Oncology

## 2018-09-22 ENCOUNTER — Other Ambulatory Visit (HOSPITAL_COMMUNITY): Payer: BLUE CROSS/BLUE SHIELD

## 2018-09-22 ENCOUNTER — Ambulatory Visit (HOSPITAL_COMMUNITY): Payer: BLUE CROSS/BLUE SHIELD

## 2018-09-22 ENCOUNTER — Telehealth: Payer: Self-pay

## 2018-09-22 NOTE — Telephone Encounter (Signed)
Mcarthur Rossetti, Medical Interpreter, with new appointments for radiation at 9:35 am and port insertion at [facility] at 10:30 am on 09/27/18.  She needs to be NPO after midnight and needs a driver. Also that labs will be canceled tomorrow, 09/23/18 and she needs to arrive at 10 am for chemo education. Almyra Free said she will notify patient.

## 2018-09-22 NOTE — Telephone Encounter (Signed)
Amy Morrow in IR called and left a message. Amy Morrow did not show up for her 8 am appt today and they will be unable to place port today.

## 2018-09-22 NOTE — Progress Notes (Signed)
  Radiation Oncology         [phone] ________________________________  Name: Amy Morrow MRN: [id number]  Date: 09/21/2018  DOB: November 01, 1960  SIMULATION AND TREATMENT PLANNING NOTE    ICD-10-CM   1. Recurrent cervical cancer (HCC) C53.9     DIAGNOSIS: recurrent cervical cancer  NARRATIVE:  The patient was brought to the [facility].  Identity was confirmed.  All relevant records and images related to the planned course of therapy were reviewed.  The patient freely provided informed written consent to proceed with treatment after reviewing the details related to the planned course of therapy. The consent form was witnessed and verified by the simulation staff.  Then, the patient was set-up in a stable reproducible  supine position for radiation therapy.  CT images were obtained.  Surface markings were placed.  The CT images were loaded into the planning software.  Then the target and avoidance structures were contoured.  Treatment planning then occurred.  The radiation prescription was entered and confirmed.  Then, I designed and supervised the construction of a total of 2 medically necessary complex treatment devices.  I have requested : Intensity Modulated Radiotherapy (IMRT) is medically necessary for this case for the following reason:  Small bowel sparing..  I have ordered:dose calc.  PLAN:  The patient will receive 45 Gy in 25 fractions directed at the pelvis area,  followed by 4 intracavitary brachytherapy treatments directed at the proximal vagina, the area of recurrence.  -----------------------------------  Blair Promise, PhD, MD

## 2018-09-23 ENCOUNTER — Ambulatory Visit
Admission: RE | Admit: 2018-09-23 | Discharge: 2018-09-23 | Disposition: A | Discharge status: Home / Self Care | Payer: BLUE CROSS/BLUE SHIELD | Source: Ambulatory Visit | Attending: Hematology and Oncology | Admitting: Hematology and Oncology

## 2018-09-23 ENCOUNTER — Inpatient Hospital Stay (HOSPITAL_BASED_OUTPATIENT_CLINIC_OR_DEPARTMENT_OTHER): Payer: BLUE CROSS/BLUE SHIELD | Admitting: Hematology and Oncology

## 2018-09-23 ENCOUNTER — Other Ambulatory Visit: Payer: Self-pay | Admitting: Radiology

## 2018-09-23 ENCOUNTER — Telehealth: Payer: Self-pay | Admitting: Hematology and Oncology

## 2018-09-23 ENCOUNTER — Other Ambulatory Visit: Payer: BLUE CROSS/BLUE SHIELD

## 2018-09-23 ENCOUNTER — Encounter: Payer: Self-pay | Admitting: Hematology and Oncology

## 2018-09-23 ENCOUNTER — Inpatient Hospital Stay: Admission: RE | Admit: 2018-09-23 | Payer: BLUE CROSS/BLUE SHIELD | Source: Ambulatory Visit

## 2018-09-23 ENCOUNTER — Inpatient Hospital Stay: Payer: BLUE CROSS/BLUE SHIELD

## 2018-09-23 ENCOUNTER — Other Ambulatory Visit (HOSPITAL_COMMUNITY)
Admission: RE | Admit: 2018-09-23 | Discharge: 2018-09-23 | Disposition: A | Discharge status: Home / Self Care | Payer: BLUE CROSS/BLUE SHIELD | Source: Ambulatory Visit | Attending: Interventional Radiology | Admitting: Interventional Radiology

## 2018-09-23 VITALS — BP 119/65 | HR 80 | Temp 98.1°F | Resp 18 | Ht 59.0 in | Wt 147.6 lb

## 2018-09-23 DIAGNOSIS — E041 Nontoxic single thyroid nodule: Secondary | ICD-10-CM

## 2018-09-23 DIAGNOSIS — C539 Malignant neoplasm of cervix uteri, unspecified: Secondary | ICD-10-CM | POA: Diagnosis not present

## 2018-09-23 DIAGNOSIS — K5909 Other constipation: Secondary | ICD-10-CM

## 2018-09-23 DIAGNOSIS — Z510 Encounter for antineoplastic radiation therapy: Secondary | ICD-10-CM | POA: Diagnosis not present

## 2018-09-23 DIAGNOSIS — R339 Retention of urine, unspecified: Secondary | ICD-10-CM | POA: Diagnosis not present

## 2018-09-23 MED ORDER — LIDOCAINE-PRILOCAINE 2.5-2.5 % EX CREA: TOPICAL_CREAM | CUTANEOUS | 3 refills | Status: DC

## 2018-09-23 MED ORDER — ONDANSETRON HCL 8 MG PO TABS: 8.0000 mg | ORAL_TABLET | Freq: Three times a day (TID) | ORAL | 1 refills | Status: DC | PRN

## 2018-09-23 MED ORDER — PROCHLORPERAZINE MALEATE 10 MG PO TABS: 10.0000 mg | ORAL_TABLET | Freq: Four times a day (QID) | ORAL | 1 refills | Status: DC | PRN

## 2018-09-23 MED FILL — PROCHLORPERAZINE 10 MG TAB: 10 | 7 days supply | Qty: 30 | Fill #0

## 2018-09-23 MED FILL — LIDOCAINE-PRILOCAINE CREAM: 2.5-2.5 | 30 days supply | Qty: 30 | Fill #0

## 2018-09-23 MED FILL — ONDANSETRON HCL 8 MG TABLET: 8 | 10 days supply | Qty: 30 | Fill #0

## 2018-09-23 NOTE — Telephone Encounter (Signed)
Gave avs and calendar ° °

## 2018-09-23 NOTE — Progress Notes (Signed)
Amy Morrow OFFICE PROGRESS NOTE  Patient Care Team: Amy Blackbird, MD as PCP - General (Family Medicine) Amy Morrow Cotta, RN as Oncology Nurse Navigator (Oncology)  ASSESSMENT & PLAN:  Recurrent cervical cancer [facility]) We discussed the role of chemotherapy. The intent is of curative intent.  We discussed some of the risks, benefits, side-effects of cisplatin and its role as chemo sensitizing agent. The plan for weekly cisplatin for x5 doses along with radiation treatment.  Some of the short term side-effects included, though not limited to, including weight loss, life threatening infections, risk of allergic reactions, need for transfusions of blood products, nausea, vomiting, change in bowel habits, loss of hair, admission to hospital for various reasons, and risks of death.   Long term side-effects are also discussed including risks of infertility, permanent damage to nerve function, hearing loss, chronic fatigue, kidney damage with possibility needing hemodialysis, and rare secondary malignancy including bone marrow disorders.  The patient is aware that the response rates discussed earlier is not guaranteed.  After a long discussion, patient made an informed decision to proceed with the prescribed plan of care.   Patient education material was dispensed. I will see her on a weekly basis for toxicity review and follow-up Her port is rescheduled to next week and she will start her chemotherapy next week    Other constipation This is resolving with laxative.  She will continue the same  Thyroid nodule Thyroid biopsy is scheduled today.  Urinary retention This is stable.   No orders of the defined types were placed in this encounter.   INTERVAL HISTORY: Please see below for problem oriented charting. She returns with interpreter for further follow-up Unfortunately, due to miscommunication, her port has been rescheduled to next week In the meantime, she is not  symptomatic Denies vaginal bleeding or discharge Constipation is stable with laxatives Urinary retention is stable  SUMMARY OF ONCOLOGIC HISTORY: Oncology History   Recurrent cervical cancer to the vagina HPV positive from 2019 specimen     Recurrent cervical cancer ([facility])   06/11/2011 Pathology Results    1. Endocervix, curettage DETACHED FRAGMENTS OF SQUAMOUS MUCOSA WITH KOILOCYTIC ATYPIA, SEE COMMENT. 2. Cervix, biopsy LOW GRADE SQUAMOUS INTRAEPITHELIAL LESION, CIN-I (MILD DYSPLASIA), SEE COMMENT. Microscopic Comment 1. 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 2. The findings    01/09/2012 Pathology Results    LOW GRADE SQUAMOUS INTRAEPITHELIAL LESION: CIN-1/ HPV (LSIL). ENDOMETRIAL CELLS ARE PRESENT.    01/12/2018 Pathology Results    Cervix, biopsy - INVASIVE SQUAMOUS CELL CARCINOMA - SEE COMMENT    02/08/2018 Pathology Results    1. Lymph nodes, regional resection, right pelvic - EIGHT LYMPH NODES, NEGATIVE FOR CARCINOMA (0/8). 2. Lymph nodes, regional resection, left pelvic - TWELVE LYMPH NODES, NEGATIVE FOR CARCINOMA (0/12). 3. Uterus +/- tubes/ovaries, neoplastic, cervix, bilateral tubes and ovaries, upper third of vagina - INVASIVE MODERATELY DIFFERENTIATED SQUAMOUS CELL CARCINOMA, 2.6 CM, INVOLVING ANTERIOR PORTION OF CERVIX FROM 9 O'CLOCK TO 3 O'CLOCK. - TUMOR INVADES FOR DEPTH OF 0.5 CM AND IS CONFINED TO THE CERVIX. - ALL RESECTION MARGINS ARE NEGATIVE FOR CARCINOMA; THE CLOSEST IS THE VAGINAL CUFF MARGIN AT 1 CM. - NEGATIVE FOR LYMPHOVASCULAR OR PERINEURAL INVASION. - BENIGN ENDOMETRIAL POLYP, 2.6 CM. - BENIGN LEIOMYOMA, 2.2 CM - BENIGN INACTIVE ENDOMETRIUM. - BENIGN BILATERAL OVARIES AND FALLOPIAN TUBES. - SEE ONCOLOGY TABLE. Microscopic Comment 3. UTERINE CERVIX: Resection Procedure: Radial hysterectomy. Tumor Size: 2.6 cm. Histologic Type: Squamous cell carcinoma. Histologic Grade: G2:  Moderately differentiated. Stromal  Invasion: Depth of stromal invasion (millimeters): 5 mm. Horizontal extent longitudinal/length (if applicable#) (millimeters): 20 mm. Horizontal extent circumferential/width (if applicable#) (millimeters): 26 mm. Other Tissue/ Organ: Not involved. Margins: Negative for carcinoma. Lymphovascular Invasion: Not identified. Regional Lymph Nodes: All lymph nodes negative for tumor cells Total Number of Lymph Nodes Examined: 20. Number of Sentinel Nodes Examined (if applicable): 0. Pathologic Stage Classification (pTNM, AJCC 8th Edition): pT1b1, pN0. Ancillary Studies: Not applicable. Representative Tumor Block: 3E, 71F, and 3G.    02/08/2018 Surgery    Operation: Robotic-assisted type III radical laparoscopic hysterectomy with bilateral salpingoophorectomy and bilateral pelvic lymphadenectomy  Surgeon: Donaciano Eva  Operative Findings:  : 2-3cm pedunculated exophytic mass from right anterior cervix. No clinical involvement of the parametria, no suspicious nodes    02/16/2018 Surgery    Procedure: 1. Cystoscopy, bilateral retrograde pyelograms with interpretation 2. Diagnostic ureteroscopy 3. Bilateral ureteral stent placement  Surgeon: Ardis Hughs, MD  Intraoperative findings:  #1: The retrograde pyelogram on the patient's right side initially demonstrated significant contrast extravasation several centimeters from the ureteral orifice.  There was proximal hydroureteronephrosis. #2: Ureteroscopy of the right ureter demonstrated a full-thickness ureteral injury likely thermal in nature approximately 2-1/2 cm from the UVJ. #3: The retrograde on the patient's left side demonstrated severe extravasation with no contrast getting beyond the first centimeter of the ureter.  Once beyond this area with a ureteroscope the ureter was mild to moderately dilated. 4.:  Ureteroscopy of the left ureter demonstrated an area approximately 2 cm from the UVJ and lasting approximately a  centimeter and a half of devitalized tissue, full-thickness, likely thermal in nature. #5: 5 French x22 cm Polaris stents were placed bilaterally.     03/22/2018 Imaging    Highly suspicious nodule in the inferior right thyroid lobe corresponds with the hypermetabolic activity on the PET-CT. Recommend ultrasound-guided biopsy of this right thyroid nodule.  No other thyroid nodules.    04/22/2018 Surgery    Procedure: 4. Cystoscopy 5. Bilateral retrograde pyelogram with interpretation 6. Bilateral ureteral stent exchange 7. Left ureteroscopy, diagnostic  Surgeon: Ardis Hughs, MD  Intraoperative findings:  #1: On speculum exam there was a tine from the right ureteral stent (Polaris) noted at the vaginal cuff.  The remainder of the vaginal cuff was healed, including the area around the ureteral stent tine. 2.:  The right retrograde pyelogram was performed using 10 cc of Omni, and demonstrated normal caliber ureter with no significant hydroureteronephrosis.  There did not appear to be any communication or extravasation of contrast in the distal ureter or UVJ with the vagina. 3.:  The left retrograde pyelogram demonstrated a small area of extravasation at the UVJ, nearly the intramural ureter, that seemed to communicate with the patient's vagina.  There were no other significant filling defects, there is no hydroureteronephrosis. 4.:  Left diagnostic ureteroscopy demonstrated an abnormality within the intramural or very distal UVJ region where there was some bullous edema and likely the site of the fistula.  There was no identifiable communication.  The remaining aspect of the ureter was normal.    05/28/2018 Surgery    Procedure: 8. Bilateral retrograde pyelogram with interpretation 9. Bilateral diagnostic ureteroscopy 10. Bilateral ureteral stent exchange 11. Bilateral nephrostomy tube removal  Surgeon: Ardis Hughs, MD  Intraoperative findings:  #1: 5 French  open-ended ureteral catheter was used to perform a retrograde pyelogram in the patient's left ureter which demonstrated normal caliber ureter with no hydro-.  However, there was a narrowed segment at the intramural ureter and just at the UVJ with a prominent fistula draining into the vagina. #2: I removed the wire to see the how well the ureter with drain, it did not drain well at all.  As such I tried to repassed the wire and ultimately had to pass a ureteroscope through the distal ureter in order to get the wire up into the left renal pelvis.  This noted distorted and abnormal mucosa in the intravesical and ureterovesical junction. #3: The patient's right sided ureteral stent distal had migrated into the patient's vagina.  In order to remove the stent I had to perform ureteroscopy on the right ureter cannulating the right ureteral orifice and advancing it through the intramural ureter and beyond the distal ureter.  Once I was up beyond the fistula I passed the wire through the scope and into the right renal pelvis.  Once the wire was in the pelvis I removed the scope and pulled the stent through the patient's vagina. 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 #5: A 24 cm x 6 French double-J ureteral stent was placed in the right ureter. #6: A 22 cm x 5 French Polaris catheter was placed in the patient's left ureter    07/02/2018 Surgery    Procedure: 1. Robotic assisted laparoscopic bilateral ureteral reimplant  Surgeon: Ardis Hughs, MD First Assistant: Dr. [name], MD  Intraoperative findings:  #1: The patient's ureters were thickened, and adherent to the sidewalls, but there were able to be reimplanted without any tension. #2: The patient had a mid urethral sling mesh noted around the suprapubic bone that was  unmolested.    08/27/2018 Pathology Results    Vagina, biopsy, left upper vaginal side - wall - SQUAMOUS CELL CARCINOMA.    09/15/2018 Cancer Staging    Staging form: Cervix Uteri, AJCC 7th Edition - Pathologic: FIGO Stage IB1 (T1b1, N0, cM0) - Signed by Heath Lark, MD on 09/16/2018     REVIEW OF SYSTEMS:   Constitutional: Denies fevers, chills or abnormal weight loss Eyes: Denies blurriness of vision Ears, nose, mouth, throat, and face: Denies mucositis or sore throat Respiratory: Denies cough, dyspnea or wheezes Cardiovascular: Denies palpitation, chest discomfort or lower extremity swelling Gastrointestinal:  Denies nausea, heartburn or change in bowel habits Skin: Denies abnormal skin rashes Lymphatics: Denies new lymphadenopathy or easy bruising Neurological:Denies numbness, tingling or new weaknesses Behavioral/Psych: Mood is stable, no new changes  All other systems were reviewed with the patient and are negative.  I have reviewed the past medical history, past surgical history, social history and family history with the patient and they are unchanged from previous note.  ALLERGIES:  has No Known Allergies.  MEDICATIONS:  Current Outpatient Medications  Medication Sig Dispense Refill  . acetaminophen (TYLENOL) 500 MG tablet Take 2 tablets (1,000 mg total) by mouth every 6 (six) hours as needed for moderate pain, fever or headache. 30 tablet 0  . lidocaine-prilocaine (EMLA) cream Apply to affected area once 30 g 3  . ondansetron (ZOFRAN) 8 MG tablet Take 1 tablet (8 mg total) by mouth every 8 (eight) hours as needed. Start on the third day after chemotherapy. 30 tablet 1  . prochlorperazine (COMPAZINE) 10 MG tablet Take 1 tablet (10 mg total) by mouth every 6 (six) hours as needed (Nausea or  vomiting). 30 tablet 1   No current facility-administered medications for this visit.     PHYSICAL EXAMINATION: ECOG PERFORMANCE STATUS: 0 - Asymptomatic  Vitals:   09/23/18 1238   BP: 119/65  Pulse: 80  Resp: 18  Temp: 98.1 F (36.7 C)  SpO2: 100%   Filed Weights   09/23/18 1238  Weight: 147 lb 9.6 oz (67 kg)    GENERAL:alert, no distress and comfortable Musculoskeletal:no cyanosis of digits and no clubbing  NEURO: alert & oriented x 3 with fluent speech, no focal motor/sensory deficits  LABORATORY DATA:  I have reviewed the data as listed    Component Value Date/Time   NA 136 06/29/2018 0641   NA 140 05/04/2018 1657   K 4.2 06/29/2018 0641   CL 103 06/29/2018 0641   CO2 23 06/29/2018 0641   GLUCOSE 111 (H) 06/29/2018 0641   BUN 11 06/29/2018 0641   BUN 11 05/04/2018 1657   CREATININE 0.68 06/29/2018 0641   CALCIUM 8.9 06/29/2018 0641   PROT 8.6 (H) 06/16/2018 1003   PROT 7.3 05/04/2018 1657   ALBUMIN 4.4 06/16/2018 1003   ALBUMIN 4.3 05/04/2018 1657   AST 18 06/16/2018 1003   ALT 13 06/16/2018 1003   ALKPHOS 97 06/16/2018 1003   BILITOT 0.6 06/16/2018 1003   BILITOT <0.2 05/04/2018 1657   GFRNONAA >60 06/29/2018 0641   GFRAA >60 06/29/2018 0641    No results found for: SPEP, UPEP  Lab Results  Component Value Date   WBC 7.1 06/29/2018   NEUTROABS 4.6 06/16/2018   HGB 9.3 (L) 06/29/2018   HCT 29.3 (L) 06/29/2018   MCV 85.4 06/29/2018   PLT 478 (H) 06/29/2018      Chemistry      Component Value Date/Time   NA 136 06/29/2018 0641   NA 140 05/04/2018 1657   K 4.2 06/29/2018 0641   CL 103 06/29/2018 0641   CO2 23 06/29/2018 0641   BUN 11 06/29/2018 0641   BUN 11 05/04/2018 1657   CREATININE 0.68 06/29/2018 0641      Component Value Date/Time   CALCIUM 8.9 06/29/2018 0641   ALKPHOS 97 06/16/2018 1003   AST 18 06/16/2018 1003   ALT 13 06/16/2018 1003   BILITOT 0.6 06/16/2018 1003   BILITOT <0.2 05/04/2018 1657       RADIOGRAPHIC STUDIES: I have personally reviewed the radiological images as listed and agreed with the findings in the report. Nm Pet Image Restag (ps) Skull Base To Thigh  Result Date:  09/14/2018 CLINICAL DATA:  Subsequent treatment strategy for cervical cancer. Recurrent cervical cancer. EXAM: NUCLEAR MEDICINE PET SKULL BASE TO THIGH TECHNIQUE: 7.2 mCi F-18 FDG was injected intravenously. Full-ring PET imaging was performed from the skull base to thigh after the radiotracer. CT data was obtained and used for attenuation correction and anatomic localization. Fasting blood glucose: 102 mg/dl COMPARISON:  PET-CT 02/03/2018.  CT 11 13 19  FINDINGS: Mediastinal blood pool activity: SUV max 2.1 NECK: No hypermetabolic lymph nodes in the neck. Focal hypermetabolic nodule in the posterior aspect RIGHT thyroid gland again noted and unchanged. Findings consistent with thyroid adenoma versus thyroid carcinoma. Incidental CT findings: none CHEST: No hypermetabolic mediastinal or hilar nodes. No suspicious pulmonary nodules on the CT scan. Incidental CT findings: none ABDOMEN/PELVIS: There is focal activity along the LEFT side of the vaginal cuff which is difficult to tease out on the background of the intense radiotracer activity within bladder. No hypermetabolic pelvic lymph nodes. No hypermetabolic periaortic  lymph nodes. No abnormal activity in liver. Incidental CT findings: Post hysterectomy anatomy. No hydronephrosis hydroureter. SKELETON: No focal hypermetabolic activity to suggest skeletal metastasis. Incidental CT findings: none IMPRESSION: 1. No evidence of metastatic adenopathy in the abdomen or pelvis. No soft tissue metastasis. 2. Focal activity in the LEFT aspect of vaginal cuff consistent with local carcinoma recurrence. 3. Hypermetabolic nodule in the RIGHT lobe of thyroid gland. Differential includes thyroid adenoma versus thyroid carcinoma. Consider thyroid ultrasound. Electronically Signed   By: Suzy Bouchard M.D.   On: 09/14/2018 09:10    All questions were answered. The patient knows to call the clinic with any problems, questions or concerns. No barriers to learning was  detected.  I spent 20 minutes counseling the patient face to face. The total time spent in the appointment was 30 minutes and more than 50% was on counseling and review of test results  Heath Lark, MD 09/23/2018 1:48 PM

## 2018-09-23 NOTE — Assessment & Plan Note (Signed)
Thyroid biopsy is scheduled today.

## 2018-09-23 NOTE — Assessment & Plan Note (Signed)
This is stable 

## 2018-09-23 NOTE — Assessment & Plan Note (Signed)
This is resolving with laxative.  She will continue the same

## 2018-09-23 NOTE — Assessment & Plan Note (Signed)
We discussed the role of chemotherapy. The intent is of curative intent.  We discussed some of the risks, benefits, side-effects of cisplatin and its role as chemo sensitizing agent. The plan for weekly cisplatin for x5 doses along with radiation treatment.  Some of the short term side-effects included, though not limited to, including weight loss, life threatening infections, risk of allergic reactions, need for transfusions of blood products, nausea, vomiting, change in bowel habits, loss of hair, admission to hospital for various reasons, and risks of death.   Long term side-effects are also discussed including risks of infertility, permanent damage to nerve function, hearing loss, chronic fatigue, kidney damage with possibility needing hemodialysis, and rare secondary malignancy including bone marrow disorders.  The patient is aware that the response rates discussed earlier is not guaranteed.  After a long discussion, patient made an informed decision to proceed with the prescribed plan of care.   Patient education material was dispensed. I will see her on a weekly basis for toxicity review and follow-up Her port is rescheduled to next week and she will start her chemotherapy next week

## 2018-09-24 ENCOUNTER — Ambulatory Visit: Payer: BLUE CROSS/BLUE SHIELD | Admitting: Family Medicine

## 2018-09-24 ENCOUNTER — Ambulatory Visit: Payer: BLUE CROSS/BLUE SHIELD

## 2018-09-27 ENCOUNTER — Encounter: Payer: Self-pay | Admitting: Hematology and Oncology

## 2018-09-27 ENCOUNTER — Ambulatory Visit (HOSPITAL_COMMUNITY)
Admission: RE | Admit: 2018-09-27 | Discharge: 2018-09-27 | Disposition: A | Discharge status: Home / Self Care | Payer: BLUE CROSS/BLUE SHIELD | Source: Ambulatory Visit | Attending: Hematology and Oncology | Admitting: Hematology and Oncology

## 2018-09-27 ENCOUNTER — Other Ambulatory Visit: Payer: Self-pay

## 2018-09-27 ENCOUNTER — Ambulatory Visit
Admission: RE | Admit: 2018-09-27 | Discharge: 2018-09-27 | Disposition: A | Discharge status: Home / Self Care | Payer: BLUE CROSS/BLUE SHIELD | Source: Ambulatory Visit | Attending: Radiation Oncology | Admitting: Radiation Oncology

## 2018-09-27 ENCOUNTER — Encounter (HOSPITAL_COMMUNITY): Payer: Self-pay | Admitting: Interventional Radiology

## 2018-09-27 DIAGNOSIS — Z510 Encounter for antineoplastic radiation therapy: Secondary | ICD-10-CM | POA: Diagnosis not present

## 2018-09-27 DIAGNOSIS — C531 Malignant neoplasm of exocervix: Secondary | ICD-10-CM | POA: Diagnosis present

## 2018-09-27 DIAGNOSIS — C539 Malignant neoplasm of cervix uteri, unspecified: Secondary | ICD-10-CM

## 2018-09-27 DIAGNOSIS — C73 Malignant neoplasm of thyroid gland: Secondary | ICD-10-CM | POA: Insufficient documentation

## 2018-09-27 HISTORY — PX: IR IMAGING GUIDED PORT INSERTION: IMG5740

## 2018-09-27 LAB — CBC
HCT: 36.8 % (ref 36.0–46.0)
Hemoglobin: 11.7 g/dL — ABNORMAL LOW (ref 12.0–15.0)
MCH: 28.1 pg (ref 26.0–34.0)
MCHC: 31.8 g/dL (ref 30.0–36.0)
MCV: 88.5 fL (ref 80.0–100.0)
Platelets: 285 10*3/uL (ref 150–400)
RBC: 4.16 MIL/uL (ref 3.87–5.11)
RDW: 15.2 % (ref 11.5–15.5)
WBC: 5 10*3/uL (ref 4.0–10.5)
nRBC: 0 % (ref 0.0–0.2)

## 2018-09-27 LAB — BASIC METABOLIC PANEL
Anion gap: 7 (ref 5–15)
BUN: 13 mg/dL (ref 6–20)
CO2: 27 mmol/L (ref 22–32)
Calcium: 9.3 mg/dL (ref 8.9–10.3)
Chloride: 104 mmol/L (ref 98–111)
Creatinine, Ser: 0.58 mg/dL (ref 0.44–1.00)
GFR calc non Af Amer: >60 mL/min (ref >60)
Glucose, Bld: 98 mg/dL (ref 70–99)
Potassium: 3.3 mmol/L — ABNORMAL LOW (ref 3.5–5.1)
Sodium: 138 mmol/L (ref 135–145)

## 2018-09-27 LAB — PROTIME-INR
INR: 1.1
Prothrombin Time: 13.8 seconds (ref 11.4–15.2)

## 2018-09-27 MED ORDER — NALOXONE HCL 0.4 MG/ML IJ SOLN
INTRAMUSCULAR | Status: AC
  Filled 2018-09-27: qty 1

## 2018-09-27 MED ORDER — FLUMAZENIL 0.5 MG/5ML IV SOLN
INTRAVENOUS | Status: AC
  Filled 2018-09-27: qty 5

## 2018-09-27 MED ORDER — LIDOCAINE-EPINEPHRINE (PF) 1 %-1:200000 IJ SOLN
INTRAMUSCULAR | Status: AC | PRN
  Administered 2018-09-27: 10 mL

## 2018-09-27 MED ORDER — SODIUM CHLORIDE 0.9 % IV SOLN: INTRAVENOUS | Status: DC

## 2018-09-27 MED ORDER — FENTANYL CITRATE (PF) 100 MCG/2ML IJ SOLN
INTRAMUSCULAR | Status: AC
  Filled 2018-09-27: qty 4

## 2018-09-27 MED ORDER — LIDOCAINE-EPINEPHRINE (PF) 1 %-1:200000 IJ SOLN
INTRAMUSCULAR | Status: AC
  Filled 2018-09-27: qty 30

## 2018-09-27 MED ORDER — MIDAZOLAM HCL 2 MG/2ML IJ SOLN
INTRAMUSCULAR | Status: AC
  Filled 2018-09-27: qty 4

## 2018-09-27 MED ORDER — MIDAZOLAM HCL 2 MG/2ML IJ SOLN
INTRAMUSCULAR | Status: AC | PRN
  Administered 2018-09-27 (×2): 1 mg via INTRAVENOUS

## 2018-09-27 MED ORDER — FENTANYL CITRATE (PF) 100 MCG/2ML IJ SOLN
INTRAMUSCULAR | Status: AC | PRN
  Administered 2018-09-27 (×2): 50 ug via INTRAVENOUS

## 2018-09-27 MED ORDER — HEPARIN SOD (PORK) LOCK FLUSH 100 UNIT/ML IV SOLN
INTRAVENOUS | Status: AC
  Filled 2018-09-27: qty 5

## 2018-09-27 MED ORDER — CEFAZOLIN SODIUM-DEXTROSE 2-4 GM/100ML-% IV SOLN
INTRAVENOUS | Status: AC
  Administered 2018-09-27: 2 g via INTRAVENOUS
  Filled 2018-09-27: qty 100

## 2018-09-27 MED ORDER — CEFAZOLIN SODIUM-DEXTROSE 2-4 GM/100ML-% IV SOLN
2.0000 g | INTRAVENOUS | Status: AC
  Administered 2018-09-27: 2 g via INTRAVENOUS

## 2018-09-27 NOTE — Progress Notes (Signed)
  Radiation Oncology         [phone] ________________________________  Name: MIRYAM MCELHINNEY MRN: [id number]  Date: 09/27/2018  DOB: [birth year]  Simulation Verification Note    ICD-10-CM   1. Recurrent cervical cancer (HCC) C53.9     Status: outpatient  NARRATIVE: The patient was brought to the treatment unit and placed in the planned treatment position. The clinical setup was verified. Then port films were obtained and uploaded to the radiation oncology medical record software.  The treatment beams were carefully compared against the planned radiation fields. The position location and shape of the radiation fields was reviewed. They targeted volume of tissue appears to be appropriately covered by the radiation beams. Organs at risk appear to be excluded as planned.  Based on my personal review, I approved the simulation verification. The patient's treatment will proceed as planned.  -----------------------------------  Blair Promise, PhD, MD This document serves as a record of services personally performed by Gery Pray, MD. It was created on his behalf by Mary-Margaret Loma Messing, a trained medical scribe. The creation of this record is based on the scribe's personal observations and the provider's statements to them. This document has been checked and approved by the attending provider.

## 2018-09-27 NOTE — H&P (Signed)
Chief Complaint: Patient was seen in consultation today for port placement.  Referring Physician(s): Heath Lark  Supervising Physician: Jacqulynn Cadet  Patient Status: [facility] - Out-pt  History of Present Illness: Amy Morrow is a 58 y.o. female with a past medical history significant for thyroid nodule, ureterovaginal fistula, HLD, anemia and recently diagnosed cervical cancer followed by Dr. Alvy Bimler who presents today for port placement. Per chart patient with history of abnormal pap smear dating back to 2012 followed by Dr. Denman George with [place]. She noted post menopausal bleeding 12/2017 and was seen by GYN where a mass was noted on exam. She underwent cervical biopsy on 01/12/18 which was positive for squamous cell carcinoma. She underwent radical hysterectomy with pelvic lymphadenectomy 02/08/2018 and was later readmitted on post op day 7 with bilateral ureterovaginal fistulas - bilateral ureteral stents were placed. She then underwent right PCN placement on 03/02/18 and subsequently left PCN placement on 03/18/18. She was last seen by GYN-ONC 09/03/18 and underwent PET scan on 09/14/18 which showed no evidence of metastatic adenopathy in the abdomen or pelvis, no soft tissue metastasis, focal activity in the left aspect of the vaginal cuff c/w local carcinoma recurrence and hypermetabolic nodule in the right lobe of the thyroid gland. 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 She was last seen by Dr. Alvy Bimler on 09/23/18 who plans to proceed with chemotherapy next week.  Patient reports she feels fine, denies any complaints, is wondering if she will be able to make her daughter's appointment at 3:15 today. History is obtained from patient via interpreter. She states understanding of procedure and wishes to proceed.   Past Medical History:  Diagnosis Date  . Abnormal Pap smear   . Anemia   . Cervical cancer [facility])  oncologist- dr Denman George   Stage IB1  SCCa   . Dyspnea   . Fatigue   . Hyperlipidemia   . Left breast mass 2013   7x4x4 mm 6o'clock  . PONV (postoperative nausea and vomiting)   . Pre-diabetes   . Sepsis ([place]) 03/2018   Urosepsis, Resolved  . Thyroid nodule 03/25/2018   inferior right thyroid, noted on US thyroid  . Ureterovaginal fistula   . Urgency of urination    intermittant  . Urinary frequency     Past Surgical History:  Procedure Laterality Date  . ABDOMINAL HYSTERECTOMY    . BREAST BIOPSY Left 06/15/2012  . CYSTOSCOPY W/ RETROGRADES Bilateral 02/16/2018   Procedure: CYSTOSCOPY WITH RETROGRADE PYELOGRAM BILATERAL DIAGNOSTIC URETEROSCOPY, BILATERAL  STENT PLACEMENT;  Surgeon: Ardis Hughs, MD;  Location: [facility];  Service: Urology;  Laterality: Bilateral;  . CYSTOSCOPY W/ URETERAL STENT PLACEMENT Bilateral 04/22/2018   Procedure: CYSTOSCOPY WITH BILATERAL RETROGRADE PYELOGRAM/ AND URETERAL STENT EXCHANGE, vaginal exam;  Surgeon: Ardis Hughs, MD;  Location: [facility];  Service: Urology;  Laterality: Bilateral;  . CYSTOSCOPY W/ URETERAL STENT PLACEMENT Bilateral 05/28/2018   Procedure: CYSTOSCOPY WITH BILATERAL  RETROGRADE PYELOGRAM/URETERAL STENT REPLACEMENT, REMOVAL OF BILATERAL PERCUTANEOUS DRAINS;  Surgeon: Ardis Hughs, MD;  Location: [facility];  Service: Urology;  Laterality: Bilateral;  . D & C LEEP CONIZATION BX  07-31-2003   dr p. rose [facility]  . IR NEPHROSTOGRAM RIGHT THRU EXISTING ACCESS  03/18/2018  . IR NEPHROSTOMY EXCHANGE LEFT  04/27/2018  . IR NEPHROSTOMY EXCHANGE RIGHT  04/27/2018  . IR NEPHROSTOMY PLACEMENT LEFT  03/18/2018  . IR NEPHROSTOMY PLACEMENT RIGHT  03/02/2018  . PELVIC LYMPH NODE DISSECTION Bilateral 02/08/2018   Procedure: BILATEAL PEVIC  LYMPHADENECTOMY;  Surgeon: [name], MD;  Location: [facility];  Service: Gynecology;  Laterality: Bilateral;  . ROBOTIC ASSISTED TOTAL HYSTERECTOMY WITH BILATERAL SALPINGO  OOPHERECTOMY N/A 02/08/2018   Procedure: XI ROBOTIC ASSISTED TOTAL Radical HYSTERECTOMY WITH BILATERAL SALPINGO OOPHORECTOMY;  Surgeon: [name], MD;  Location: [facility];  Service: Gynecology;  Laterality: N/A;  . Transvaginal tape placement  03-12-2009  dr Emeterio Reeve  [facility]   GYNECARE TENSION-FREE VAGINAL TAPE SLING  . TUBAL LIGATION  05-28-2005  DR  MARSHALL  @ [facility]   PPTL    Allergies: Patient has no known allergies.  Medications: Prior to Admission medications   Medication Sig Start Date End Date Taking? Authorizing Provider  acetaminophen (TYLENOL) 500 MG tablet Take 2 tablets (1,000 mg total) by mouth every 6 (six) hours as needed for moderate pain, fever or headache. 06/25/18   Ardis Hughs, MD  lidocaine-prilocaine (EMLA) cream Apply to affected area once 09/23/18   Heath Lark, MD  ondansetron (ZOFRAN) 8 MG tablet Take 1 tablet (8 mg total) by mouth every 8 (eight) hours as needed. Start on the third day after chemotherapy. 09/23/18   Heath Lark, MD  prochlorperazine (COMPAZINE) 10 MG tablet Take 1 tablet (10 mg total) by mouth every 6 (six) hours as needed (Nausea or vomiting). 09/23/18   Heath Lark, MD     Family History  Problem Relation Age of Onset  . Hypertension Mother   . Heart disease Mother   . Hypertension Brother   . Heart disease Brother     Social History   Socioeconomic History  . Marital status: Married    Spouse name: Not on file  . Number of children: Not on file  . Years of education: Not on file  . Highest education level: Not on file  Occupational History  . Not on file  Social Needs  . Financial resource strain: Not on file  . Food insecurity:    Worry: Not on file    Inability: Not on file  . Transportation needs:    Medical: Not on file    Non-medical: Not on file  Tobacco Use  . Smoking status: Never Smoker  . Smokeless tobacco: Never Used  Substance and Sexual Activity  . Alcohol use: No  . Drug use: No  . Sexual activity: Yes      Birth control/protection: Surgical  Lifestyle  . Physical activity:    Days per week: Not on file    Minutes per session: Not on file  . Stress: Not on file  Relationships  . Social connections:    Talks on phone: Not on file    Gets together: Not on file    Attends religious service: Not on file    Active member of club or organization: Not on file    Attends meetings of clubs or organizations: Not on file    Relationship status: Not on file  Other Topics Concern  . Not on file  Social History Narrative  . Not on file     Review of Systems: A 12 point ROS discussed and pertinent positives are indicated in the HPI above.  All other systems are negative.  Review of Systems  Constitutional: Negative for chills, fever and unexpected weight change.  Respiratory: Negative for cough and shortness of breath.   Cardiovascular: Negative for chest pain.  Gastrointestinal: Negative for abdominal pain, blood in stool,  diarrhea, nausea and vomiting.  Musculoskeletal: Negative for back pain.  Neurological: Negative for dizziness, syncope and headaches.  Psychiatric/Behavioral: Negative for confusion.    Vital Signs: BP (!) 125/113   Pulse 87   Temp 97.9 F (36.6 C) (Oral)   Resp 16   Ht 4\' 11"  (1.499 m)   Wt 146 lb (66.2 kg)   LMP 11/16/2012   SpO2 99%   BMI 29.49 kg/m   Physical Exam Vitals signs reviewed.  Constitutional:      General: She is not in acute distress. HENT:     Head: Normocephalic.  Cardiovascular:     Rate and Rhythm: Normal rate and regular rhythm.  Pulmonary:     Effort: Pulmonary effort is normal.     Breath sounds: Normal breath sounds.  Abdominal:     General: There is no distension.     Palpations: Abdomen is soft.     Tenderness: There is no abdominal tenderness.  Skin:    General: Skin is warm and dry.  Neurological:     Mental Status: She is alert and oriented to person, place, and time.  Psychiatric:        Mood and Affect: Mood  normal.        Behavior: Behavior normal.        Thought Content: Thought content normal.        Judgment: Judgment normal.      MD Evaluation Airway: WNL Heart: WNL Abdomen: WNL Chest/ Lungs: WNL ASA  Classification: 3 Mallampati/Airway Score: One   Imaging: Nm Pet Image Restag (ps) Skull Base To Thigh  Result Date: 09/14/2018 CLINICAL DATA:  Subsequent treatment strategy for cervical cancer. Recurrent cervical cancer. EXAM: NUCLEAR MEDICINE PET SKULL BASE TO THIGH TECHNIQUE: 7.2 mCi F-18 FDG was injected intravenously. Full-ring PET imaging was performed from the skull base to thigh after the radiotracer. CT data was obtained and used for attenuation correction and anatomic localization. Fasting blood glucose: 102 mg/dl COMPARISON:  PET-CT 02/03/2018.  CT 11 13 19  FINDINGS: Mediastinal blood pool activity: SUV max 2.1 NECK: No hypermetabolic lymph nodes in the neck. Focal hypermetabolic nodule in the posterior aspect RIGHT thyroid gland again noted and unchanged. Findings consistent with thyroid adenoma versus thyroid carcinoma. Incidental CT findings: none CHEST: No hypermetabolic mediastinal or hilar nodes. No suspicious pulmonary nodules on the CT scan. Incidental CT findings: none ABDOMEN/PELVIS: There is focal activity along the LEFT side of the vaginal cuff which is difficult to tease out on the background of the intense radiotracer activity within bladder. No hypermetabolic pelvic lymph nodes. No hypermetabolic periaortic lymph nodes. No abnormal activity in liver. Incidental CT findings: Post hysterectomy anatomy. No hydronephrosis hydroureter. SKELETON: No focal hypermetabolic activity to suggest skeletal metastasis. Incidental CT findings: none IMPRESSION: 1. No evidence of metastatic adenopathy in the abdomen or pelvis. No soft tissue metastasis. 2. Focal activity in the LEFT aspect of vaginal cuff consistent with local carcinoma recurrence. 3. Hypermetabolic nodule in the RIGHT lobe  of thyroid gland. Differential includes thyroid adenoma versus thyroid carcinoma. Consider thyroid ultrasound. Electronically Signed   By: Suzy Bouchard M.D.   On: 09/14/2018 09:10   [place] Fna Bx Thyroid 1st Lesion Afirma  Result Date: 09/23/2018 INDICATION: Indeterminate thyroid nodule EXAM: ULTRASOUND GUIDED FINE NEEDLE ASPIRATION OF INDETERMINATE THYROID NODULE COMPARISON:  Thyroid ultrasound on 03/25/2018 MEDICATIONS: None COMPLICATIONS: None immediate. TECHNIQUE: Informed written consent was obtained from the patient after a discussion of the risks, benefits and alternatives to treatment. Questions  regarding the procedure were encouraged and answered. A timeout was performed prior to the initiation of the procedure. Pre-procedural ultrasound scanning demonstrated unchanged size and appearance of the indeterminate nodule within the inferior right lobe of the thyroid gland. The procedure was planned. The neck was prepped in the usual sterile fashion, and a sterile drape was applied covering the operative field. A timeout was performed prior to the initiation of the procedure. Local anesthesia was provided with 1% lidocaine. Under direct ultrasound guidance, 5 FNA biopsies were performed with 25 gauge needles. Multiple ultrasound images were saved for procedural documentation purposes. The samples were prepared and submitted to pathology. Limited post procedural scanning was negative for hematoma or additional complication. Dressings were placed. The patient tolerated the above procedures procedure well without immediate postprocedural complication. FINDINGS: Nodule reference number based on prior diagnostic ultrasound: 1 Maximum size: 1.2 cm Location: Right; Inferior ACR TI-RADS risk category: TR5 (>/= 7 points) Reason for biopsy: meets ACR TI-RADS criteria Ultrasound imaging confirms appropriate placement of the needles within the thyroid nodule. IMPRESSION: Technically successful ultrasound guided fine  needle aspiration of 1.2 cm inferior right thyroid nodule. Electronically Signed   By: Aletta Edouard M.D.   On: 09/23/2018 15:51    Labs:  CBC: Recent Labs    06/26/18 1133 06/27/18 0527 06/29/18 0641 09/27/18 1043  WBC 14.0* 12.5* 7.1 5.0  HGB 9.3* 8.5* 9.3* 11.7*  HCT 29.1* 27.3* 29.3* 36.8  PLT 307 312 478* 285    COAGS: Recent Labs    03/02/18 0952 03/18/18 1319 06/26/18 1703 09/27/18 1043  INR 0.90 1.00 1.15 1.1  APTT  --  31 38*  --     BMP: Recent Labs    06/26/18 1133 06/27/18 0527 06/29/18 0641 09/27/18 1043  NA 137 136 136 138  K 3.5 3.6 4.2 3.3*  CL 102 103 103 104  CO2 26 24 23 27   GLUCOSE 110* 85 111* 98  BUN 11 10 11 13   CALCIUM 8.3* 8.1* 8.9 9.3  CREATININE 0.75 0.59 0.68 0.58  GFRNONAA >60 >60 >60 >60  GFRAA >60 >60 >60 >60    LIVER FUNCTION TESTS: Recent Labs    03/18/18 1733 03/19/18 0415 05/04/18 1657 06/16/18 1003  BILITOT 0.7 0.8 <0.2 0.6  AST 19 18 13 18   ALT 14 13 12 13   ALKPHOS 79 77 102 97  PROT 6.8 6.6 7.3 8.6*  ALBUMIN 3.4* 3.2* 4.3 4.4    TUMOR MARKERS: No results for input(s): AFPTM, CEA, CA199, CHROMGRNA in the last 8760 hours.  Assessment and Plan:  Patient with recently diagnosed cervical cancer recurrence followed by Dr. Alvy Bimler who presents today for port placement in order to initiate chemotherapy.  Patient has been NPO since 9 pm last night, she does not take blood thinning medications. Afebrile, WBC 5.0, hgb 11.7, plt 285, INR 1.1, creatinine 0.58.  Risks and benefits of image guided port-a-catheter placement was discussed with the patient including, but not limited to bleeding, infection, pneumothorax, or fibrin sheath development and need for additional procedures.  All of the patient's questions were answered, patient is agreeable to proceed.  Consent signed and in chart.  Thank you for this interesting consult.  I greatly enjoyed meeting KEYLY BALDONADO and look forward to participating in their  care.  A copy of this report was sent to the requesting provider on this date.  Electronically Signed: Joaquim Nam, [name] 09/27/2018, 11:25 AM   I spent a total of  30 Minutes  in face to face in clinical consultation, greater than 50% of which was counseling/coordinating care for port placement.

## 2018-09-27 NOTE — Discharge Instructions (Signed)
Insercin del dispositivo de perfusin implantable, cuidados posteriores  Implanted Port Insertion, Care After  Esta hoja le brinda informacin sobre cmo cuidarse despus del procedimiento. El mdico tambin podr darle indicaciones ms especficas. Comunquese con el mdico si tiene problemas o preguntas.  Qu puedo esperar despus del procedimiento?  Despus del procedimiento, es comn tener los siguientes sntomas:  Molestias en el lugar de la insercin del dispositivo.  Moretones en la piel alrededor del dispositivo. Esto debera mejorar luego de 3 o 4 das.  Siga estas indicaciones en su casa:  Cuidado del dispositivo  Luego de que le coloquen el dispositivo, le darn una tarjeta de informacin del fabricante. La tarjeta contiene informacin acerca del dispositivo. Llvela siempre con usted.  Cuide el dispositivo como se lo haya indicado el mdico. Pregntele al mdico si usted o un familiar puede recibir capacitacin para cuidar del dispositivo en casa. Un enfermero a domicilio tambin puede cuidar del dispositivo.  Asegrese de recordar el tipo de dispositivo que tiene.  Cuidados de las incisiones         Siga las indicaciones del mdico acerca de los cuidados del lugar de la insercin del dispositivo. Asegrese de hacer lo siguiente:  Lvese las manos con agua y jabn antes y despus de cambiar la venda (vendaje). Use desinfectante para manos si no dispone de agua y jabn.  Cambie el vendaje como se lo haya indicado el mdico.  No retire los puntos (suturas), la goma para cerrar la piel o las tiras adhesivas. Es posible que estos cierres cutneos deban permanecer en la piel durante 2 semanas o ms tiempo. Si los bordes de las tiras adhesivas empiezan a despegarse y enroscarse, puede recortar los que estn sueltos. No retire las tiras adhesivas por completo a menos que el mdico se lo indique.  Controle el lugar de insercin del dispositivo todos los das para detectar signos de infeccin. Est atento a los  siguientes signos:  Enrojecimiento, hinchazn o dolor.  Lquido o sangre.  Calor.  Pus o mal olor.  Actividad  Retome sus actividades normales como se lo haya indicado el mdico. Pregntele al mdico qu actividades son seguras para usted.  No levante ningn objeto que pese ms de 10 libras (4.5 kg) o que supere el lmite de peso que le hayan indicado, hasta que el mdico le diga que puede hacerlo.  Indicaciones generales  Tome los medicamentos de venta libre y los recetados solamente como se lo haya indicado el mdico.  No tome baos de inmersin, no nade ni use el jacuzzi hasta que el mdico lo autorice. Pregntele al mdico si puede ducharse. [place] solo le permitan darse baos de esponja.  No conduzca durante 24 horas si le administraron un sedante durante el procedimiento.  Use un brazalete de alerta mdico en caso de emergencia. Esto permitir que cualquier mdico que lo atienda sepa que tiene un dispositivo.  Concurra a todas las visitas de seguimiento como se lo haya indicado el mdico. Esto es importante.  Comunquese con un mdico si:  No puede purgar el dispositivo con solucin salina como se le indic, o no puede extraer sangre del dispositivo.  Tiene fiebre o escalofros.  Tiene enrojecimiento, hinchazn o dolor alrededor del lugar de la insercin del dispositivo.  Le sale lquido o sangre del lugar de la insercin del dispositivo.  El lugar de la insercin del dispositivo est caliente al tacto.  Tiene pus o percibe mal olor que proviene del lugar de la insercin del   dispositivo.  Solicite ayuda inmediatamente si:  Siente falta de aire o dolor en el pecho.  Tiene hemorragia proveniente del lugar donde tiene el dispositivo y no puede controlarla.  Resumen  Cuide el dispositivo como se lo haya indicado el mdico. Tenga la tarjeta informacin del fabricante con usted en todo momento.  Cambie el vendaje como se lo haya indicado el mdico.  Comunquese con un mdico si tiene fiebre o escalofros o si tiene  enrojecimiento, hinchazn o dolor alrededor del lugar de insercin del dispositivo.  Concurra a todas las visitas de seguimiento como se lo haya indicado el mdico.  Esta informacin no tiene como fin reemplazar el consejo del mdico. Asegrese de hacerle al mdico cualquier pregunta que tenga.  Document Released: 05/11/2013 Document Revised: 03/25/2018 Document Reviewed: 03/25/2018  Elsevier Interactive Patient Education  2019 Elsevier Inc.

## 2018-09-27 NOTE — Discharge Instructions (Signed)
Remove dressing in 24-48 hours. You may shower once dressing is removed.  Insercin del dispositivo de perfusin implantable, cuidados posteriores Implanted Port Insertion, Care After [employer] brinda informacin sobre cmo cuidarse despus del procedimiento. El mdico tambin podr darle indicaciones ms especficas. Comunquese con el mdico si tiene problemas o preguntas. Qu puedo esperar despus del procedimiento? Despus del procedimiento, es comn tener los siguientes sntomas:  Chemical engineer de la insercin del dispositivo.  Moretones en la piel alrededor del dispositivo. Esto debera mejorar luego de 3 o 4 das. Siga estas indicaciones en su casa: Cuidado del dispositivo  Luego de que le coloquen el dispositivo, le darn una tarjeta de informacin del fabricante. La tarjeta contiene informacin acerca del dispositivo. Llvela siempre con usted.  Cuide el dispositivo como se lo haya indicado el mdico. Pregntele al mdico si usted o un familiar puede recibir capacitacin para cuidar del dispositivo en casa. Un enfermero a domicilio tambin puede cuidar del dispositivo.  Asegrese de recordar el tipo de dispositivo que tiene. Cuidados de las incisiones      Siga las indicaciones del mdico acerca de los cuidados del lugar de la insercin del dispositivo. Asegrese de hacer lo siguiente: ? Lvese las manos con agua y [place] antes y despus de cambiar la venda (vendaje). Use desinfectante para manos si no dispone de [place] y [place]. ? Cambie el vendaje como se lo haya indicado el mdico. ? No retire los puntos (suturas), la goma para cerrar la piel o las tiras [place]. Es posible que estos cierres cutneos Animal nutritionist en la piel durante 2semanas o ms tiempo. Si los bordes de las tiras adhesivas empiezan a despegarse y Therapist, sports, puede recortar los que estn sueltos. No retire las tiras [facility] por completo a menos que el mdico se lo indique.  Controle TEFL teacher  de insercin del dispositivo todos los das para detectar signos de infeccin. Est atento a los siguientes signos: ? Enrojecimiento, hinchazn o dolor. ? Lquido o sangre. ? Calor. ? Pus o mal olor. Actividad  Retome sus actividades normales como se lo haya indicado el mdico. Pregntele al mdico qu actividades son seguras para usted.  No levante ningn objeto que pese ms de 10libras (4.5kg) o que supere el lmite de peso que le hayan indicado, hasta que el mdico le diga que puede [place]. Indicaciones generales  [employer] de venta libre y los recetados solamente como se lo haya indicado el mdico.  No tome baos de inmersin, no nade ni use el jacuzzi hasta que el mdico lo autorice. Pregntele al mdico si puede ducharse. [place] Pounds solo le permitan darse baos de [place].  No conduzca durante 24horas si le administraron un sedante durante el procedimiento.  Use un brazalete de alerta mdico en caso de emergencia. Esto permitir que cualquier mdico que lo atienda sepa que tiene un dispositivo.  Concurra a todas las visitas de seguimiento como se lo haya indicado el mdico. Esto es importante. Comunquese con un mdico si:  No puede purgar el dispositivo con solucin salina como se le indic, o no puede extraer sangre del dispositivo.  Tiene fiebre o escalofros.  Tiene enrojecimiento, hinchazn o dolor alrededor del lugar de la insercin del dispositivo.  Le sale lquido o sangre del Environmental consultant de la insercin del dispositivo.  El lugar de la insercin del dispositivo est caliente al tacto.  Tiene pus o percibe mal olor que proviene del lugar de la insercin del dispositivo. Solicite ayuda inmediatamente  si:  Siente falta de aire o dolor en el pecho.  Tiene hemorragia proveniente del lugar donde tiene el dispositivo y no puede controlarla. Resumen  Cuide el dispositivo como se lo haya indicado el mdico. Tenga la tarjeta informacin del fabricante con usted en  todo momento.  Cambie el vendaje como se lo haya indicado el mdico.  Comunquese con un mdico si tiene fiebre o escalofros o si tiene enrojecimiento, hinchazn o dolor alrededor del Environmental consultant de insercin del dispositivo.  Concurra a todas las visitas de seguimiento como se lo haya indicado el mdico. Esta informacin no tiene Marine scientist el consejo del mdico. Asegrese de hacerle al mdico cualquier pregunta que tenga. Document Released: 05/11/2013 Document Revised: 03/25/2018 Document Reviewed: 03/25/2018 Elsevier Interactive Patient Education  2019 [name].

## 2018-09-28 ENCOUNTER — Ambulatory Visit
Admission: RE | Admit: 2018-09-28 | Discharge: 2018-09-28 | Disposition: A | Discharge status: Home / Self Care | Payer: BLUE CROSS/BLUE SHIELD | Source: Ambulatory Visit | Attending: Radiation Oncology | Admitting: Radiation Oncology

## 2018-09-28 ENCOUNTER — Ambulatory Visit: Payer: BLUE CROSS/BLUE SHIELD | Attending: Family Medicine | Admitting: Family Medicine

## 2018-09-28 DIAGNOSIS — Z510 Encounter for antineoplastic radiation therapy: Secondary | ICD-10-CM | POA: Diagnosis not present

## 2018-09-29 ENCOUNTER — Inpatient Hospital Stay: Payer: BLUE CROSS/BLUE SHIELD

## 2018-09-29 ENCOUNTER — Ambulatory Visit
Admission: RE | Admit: 2018-09-29 | Discharge: 2018-09-29 | Disposition: A | Discharge status: Home / Self Care | Payer: BLUE CROSS/BLUE SHIELD | Source: Ambulatory Visit | Attending: Radiation Oncology | Admitting: Radiation Oncology

## 2018-09-29 DIAGNOSIS — C539 Malignant neoplasm of cervix uteri, unspecified: Secondary | ICD-10-CM | POA: Diagnosis present

## 2018-09-29 DIAGNOSIS — C73 Malignant neoplasm of thyroid gland: Secondary | ICD-10-CM | POA: Diagnosis not present

## 2018-09-29 DIAGNOSIS — C531 Malignant neoplasm of exocervix: Secondary | ICD-10-CM

## 2018-09-29 DIAGNOSIS — E041 Nontoxic single thyroid nodule: Secondary | ICD-10-CM | POA: Diagnosis not present

## 2018-09-29 DIAGNOSIS — K5909 Other constipation: Secondary | ICD-10-CM | POA: Diagnosis not present

## 2018-09-29 DIAGNOSIS — R339 Retention of urine, unspecified: Secondary | ICD-10-CM | POA: Diagnosis not present

## 2018-09-29 DIAGNOSIS — Z5111 Encounter for antineoplastic chemotherapy: Secondary | ICD-10-CM | POA: Diagnosis present

## 2018-09-29 LAB — CBC WITH DIFFERENTIAL/PLATELET
Abs Immature Granulocytes: 0.01 10*3/uL (ref 0.00–0.07)
BASOS PCT: 0 %
Basophils Absolute: 0 10*3/uL (ref 0.0–0.1)
Eosinophils Absolute: 0.2 10*3/uL (ref 0.0–0.5)
Eosinophils Relative: 4 %
HCT: 34.9 % — ABNORMAL LOW (ref 36.0–46.0)
Hemoglobin: 11.2 g/dL — ABNORMAL LOW (ref 12.0–15.0)
Immature Granulocytes: 0 %
Lymphocytes Relative: 35 %
Lymphs Abs: 1.6 10*3/uL (ref 0.7–4.0)
MCH: 28.1 pg (ref 26.0–34.0)
MCHC: 32.1 g/dL (ref 30.0–36.0)
MCV: 87.5 fL (ref 80.0–100.0)
Monocytes Absolute: 0.3 10*3/uL (ref 0.1–1.0)
Monocytes Relative: 6 %
NEUTROS ABS: 2.5 10*3/uL (ref 1.7–7.7)
Neutrophils Relative %: 55 %
PLATELETS: 256 10*3/uL (ref 150–400)
RBC: 3.99 MIL/uL (ref 3.87–5.11)
RDW: 15.2 % (ref 11.5–15.5)
WBC: 4.6 10*3/uL (ref 4.0–10.5)
nRBC: 0 % (ref 0.0–0.2)

## 2018-09-29 LAB — COMPREHENSIVE METABOLIC PANEL
ALT: 10 U/L (ref 0–44)
AST: 18 U/L (ref 15–41)
Albumin: 4.1 g/dL (ref 3.5–5.0)
Alkaline Phosphatase: 97 U/L (ref 38–126)
Anion gap: 9 (ref 5–15)
BUN: 18 mg/dL (ref 6–20)
CO2: 27 mmol/L (ref 22–32)
Calcium: 9.6 mg/dL (ref 8.9–10.3)
Chloride: 105 mmol/L (ref 98–111)
Creatinine, Ser: 0.74 mg/dL (ref 0.44–1.00)
GFR calc non Af Amer: >60 mL/min (ref >60)
Glucose, Bld: 105 mg/dL — ABNORMAL HIGH (ref 70–99)
Potassium: 4 mmol/L (ref 3.5–5.1)
Sodium: 141 mmol/L (ref 135–145)
TOTAL PROTEIN: 8.1 g/dL (ref 6.5–8.1)
Total Bilirubin: 0.6 mg/dL (ref 0.3–1.2)

## 2018-09-29 LAB — MAGNESIUM: Magnesium: 2 mg/dL (ref 1.7–2.4)

## 2018-09-30 ENCOUNTER — Other Ambulatory Visit: Payer: BLUE CROSS/BLUE SHIELD

## 2018-09-30 ENCOUNTER — Encounter: Payer: Self-pay | Admitting: Hematology and Oncology

## 2018-09-30 ENCOUNTER — Ambulatory Visit
Admission: RE | Admit: 2018-09-30 | Discharge: 2018-09-30 | Disposition: A | Discharge status: Home / Self Care | Payer: BLUE CROSS/BLUE SHIELD | Source: Ambulatory Visit | Attending: Radiation Oncology | Admitting: Radiation Oncology

## 2018-09-30 ENCOUNTER — Inpatient Hospital Stay (HOSPITAL_BASED_OUTPATIENT_CLINIC_OR_DEPARTMENT_OTHER): Payer: BLUE CROSS/BLUE SHIELD | Admitting: Hematology and Oncology

## 2018-09-30 DIAGNOSIS — C73 Malignant neoplasm of thyroid gland: Secondary | ICD-10-CM | POA: Diagnosis not present

## 2018-09-30 DIAGNOSIS — Z5111 Encounter for antineoplastic chemotherapy: Secondary | ICD-10-CM | POA: Diagnosis not present

## 2018-09-30 DIAGNOSIS — C539 Malignant neoplasm of cervix uteri, unspecified: Secondary | ICD-10-CM | POA: Diagnosis not present

## 2018-09-30 NOTE — Assessment & Plan Note (Signed)
She tolerated radiation therapy well. She will start her first cycles of chemotherapy tomorrow We discussed the role of chemotherapy. The intent is of curative intent.  We discussed some of the risks, benefits, side-effects of cisplatin and its role as chemo sensitizing agent. The plan for weekly cisplatin for x5 doses along with radiation treatment.  Some of the short term side-effects included, though not limited to, including weight loss, life threatening infections, risk of allergic reactions, need for transfusions of blood products, nausea, vomiting, change in bowel habits, loss of hair, admission to hospital for various reasons, and risks of death.   Long term side-effects are also discussed including risks of infertility, permanent damage to nerve function, hearing loss, chronic fatigue, kidney damage with possibility needing hemodialysis, and rare secondary malignancy including bone marrow disorders.  The patient is aware that the response rates discussed earlier is not guaranteed.  After a long discussion, patient made an informed decision to proceed with the prescribed plan of care.  I will see her on a weekly basis for supportive care

## 2018-09-30 NOTE — Assessment & Plan Note (Signed)
Unfortunately, fine-needle aspirate and biopsy confirmed diagnosis of thyroid cancer However, in the scope of things, we need to focus on treatment of cervical cancer first Once she has completed her treatment, I will refer her to see general surgery or ENT for thyroidectomy

## 2018-09-30 NOTE — Progress Notes (Signed)
[place] OFFICE PROGRESS NOTE  Patient Care Team: Antony Blackbird, MD as PCP - General (Family Medicine) [name], RN as Oncology Nurse Navigator (Oncology)  ASSESSMENT & PLAN:  Recurrent cervical cancer [facility]) She tolerated radiation therapy well. She will start her first cycles of chemotherapy tomorrow We discussed the role of chemotherapy. The intent is of curative intent.  We discussed some of the risks, benefits, side-effects of cisplatin and its role as chemo sensitizing agent. The plan for weekly cisplatin for x5 doses along with radiation treatment.  Some of the short term side-effects included, though not limited to, including weight loss, life threatening infections, risk of allergic reactions, need for transfusions of blood products, nausea, vomiting, change in bowel habits, loss of hair, admission to hospital for various reasons, and risks of death.   Long term side-effects are also discussed including risks of infertility, permanent damage to nerve function, hearing loss, chronic fatigue, kidney damage with possibility needing hemodialysis, and rare secondary malignancy including bone marrow disorders.  The patient is aware that the response rates discussed earlier is not guaranteed.  After a long discussion, patient made an informed decision to proceed with the prescribed plan of care.  I will see her on a weekly basis for supportive care     Primary thyroid cancer ([place]) Unfortunately, fine-needle aspirate and biopsy confirmed diagnosis of thyroid cancer However, in the scope of things, we need to focus on treatment of cervical cancer first Once she has completed her treatment, I will refer her to see general surgery or ENT for thyroidectomy   No orders of the defined types were placed in this encounter.   INTERVAL HISTORY: Please see below for problem oriented charting. She is seen with the interpreter She feels well No nausea or vomiting She  tolerated port placement well She did not have complication from recent thyroid biopsy  SUMMARY OF ONCOLOGIC HISTORY: Oncology History   Recurrent cervical cancer to the vagina HPV positive from 2019 specimen     Recurrent cervical cancer ([place])   06/11/2011 Pathology Results    1. Endocervix, curettage DETACHED FRAGMENTS OF SQUAMOUS MUCOSA WITH KOILOCYTIC ATYPIA, SEE COMMENT. 2. Cervix, biopsy LOW GRADE SQUAMOUS INTRAEPITHELIAL LESION, CIN-I (MILD DYSPLASIA), SEE COMMENT. Microscopic Comment 1. 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 2. The findings    01/09/2012 Pathology Results    LOW GRADE SQUAMOUS INTRAEPITHELIAL LESION: CIN-1/ HPV (LSIL). ENDOMETRIAL CELLS ARE PRESENT.    01/12/2018 Pathology Results    Cervix, biopsy - INVASIVE SQUAMOUS CELL CARCINOMA - SEE COMMENT    02/08/2018 Pathology Results    1. Lymph nodes, regional resection, right pelvic - EIGHT LYMPH NODES, NEGATIVE FOR CARCINOMA (0/8). 2. Lymph nodes, regional resection, left pelvic - TWELVE LYMPH NODES, NEGATIVE FOR CARCINOMA (0/12). 3. Uterus +/- tubes/ovaries, neoplastic, cervix, bilateral tubes and ovaries, upper third of vagina - INVASIVE MODERATELY DIFFERENTIATED SQUAMOUS CELL CARCINOMA, 2.6 CM, INVOLVING ANTERIOR PORTION OF CERVIX FROM 9 O'CLOCK TO 3 O'CLOCK. - TUMOR INVADES FOR DEPTH OF 0.5 CM AND IS CONFINED TO THE CERVIX. - ALL RESECTION MARGINS ARE NEGATIVE FOR CARCINOMA; THE CLOSEST IS THE VAGINAL CUFF MARGIN AT 1 CM. - NEGATIVE FOR LYMPHOVASCULAR OR PERINEURAL INVASION. - BENIGN ENDOMETRIAL POLYP, 2.6 CM. - BENIGN LEIOMYOMA, 2.2 CM - BENIGN INACTIVE ENDOMETRIUM. - BENIGN BILATERAL OVARIES AND FALLOPIAN TUBES. - SEE ONCOLOGY TABLE. Microscopic Comment 3. UTERINE CERVIX: Resection Procedure: Radial hysterectomy. Tumor Size: 2.6 cm. Histologic Type: Squamous cell carcinoma. Histologic Grade: G2:  Moderately differentiated. Stromal Invasion: Depth of stromal  invasion (millimeters): 5 mm. Horizontal extent longitudinal/length (if applicable#) (millimeters): 20 mm. Horizontal extent circumferential/width (if applicable#) (millimeters): 26 mm. Other Tissue/ Organ: Not involved. Margins: Negative for carcinoma. Lymphovascular Invasion: Not identified. Regional Lymph Nodes: All lymph nodes negative for tumor cells Total Number of Lymph Nodes Examined: 20. Number of Sentinel Nodes Examined (if applicable): 0. Pathologic Stage Classification (pTNM, AJCC 8th Edition): pT1b1, pN0. Ancillary Studies: Not applicable. Representative Tumor Block: 3E, 86F, and 3G.    02/08/2018 Surgery    Operation: Robotic-assisted type III radical laparoscopic hysterectomy with bilateral salpingoophorectomy and bilateral pelvic lymphadenectomy  Surgeon: Donaciano Eva  Operative Findings:  : 2-3cm pedunculated exophytic mass from right anterior cervix. No clinical involvement of the parametria, no suspicious nodes    02/16/2018 Surgery    Procedure: 1. Cystoscopy, bilateral retrograde pyelograms with interpretation 2. Diagnostic ureteroscopy 3. Bilateral ureteral stent placement  Surgeon: Ardis Hughs, MD  Intraoperative findings:  #1: The retrograde pyelogram on the patient's right side initially demonstrated significant contrast extravasation several centimeters from the ureteral orifice.  There was proximal hydroureteronephrosis. #2: Ureteroscopy of the right ureter demonstrated a full-thickness ureteral injury likely thermal in nature approximately 2-1/2 cm from the UVJ. #3: The retrograde on the patient's left side demonstrated severe extravasation with no contrast getting beyond the first centimeter of the ureter.  Once beyond this area with a ureteroscope the ureter was mild to moderately dilated. 4.:  Ureteroscopy of the left ureter demonstrated an area approximately 2 cm from the UVJ and lasting approximately a centimeter and a half of  devitalized tissue, full-thickness, likely thermal in nature. #5: 5 French x22 cm Polaris stents were placed bilaterally.     03/22/2018 Imaging    Highly suspicious nodule in the inferior right thyroid lobe corresponds with the hypermetabolic activity on the PET-CT. Recommend ultrasound-guided biopsy of this right thyroid nodule.  No other thyroid nodules.    04/22/2018 Surgery    Procedure: 4. Cystoscopy 5. Bilateral retrograde pyelogram with interpretation 6. Bilateral ureteral stent exchange 7. Left ureteroscopy, diagnostic  Surgeon: Ardis Hughs, MD  Intraoperative findings:  #1: On speculum exam there was a tine from the right ureteral stent (Polaris) noted at the vaginal cuff.  The remainder of the vaginal cuff was healed, including the area around the ureteral stent tine. 2.:  The right retrograde pyelogram was performed using 10 cc of Omni, and demonstrated normal caliber ureter with no significant hydroureteronephrosis.  There did not appear to be any communication or extravasation of contrast in the distal ureter or UVJ with the vagina. 3.:  The left retrograde pyelogram demonstrated a small area of extravasation at the UVJ, nearly the intramural ureter, that seemed to communicate with the patient's vagina.  There were no other significant filling defects, there is no hydroureteronephrosis. 4.:  Left diagnostic ureteroscopy demonstrated an abnormality within the intramural or very distal UVJ region where there was some bullous edema and likely the site of the fistula.  There was no identifiable communication.  The remaining aspect of the ureter was normal.    05/28/2018 Surgery    Procedure: 8. Bilateral retrograde pyelogram with interpretation 9. Bilateral diagnostic ureteroscopy 10. Bilateral ureteral stent exchange 11. Bilateral nephrostomy tube removal  Surgeon: Ardis Hughs, MD  Intraoperative findings:  #1: 5 French open-ended ureteral catheter  was used to perform a retrograde pyelogram in the patient's left ureter which demonstrated normal caliber ureter with no hydro-.  However, there was a narrowed segment at the intramural ureter and just at the UVJ with a prominent fistula draining into the vagina. #2: I removed the wire to see the how well the ureter with drain, it did not drain well at all.  As such I tried to repassed the wire and ultimately had to pass a ureteroscope through the distal ureter in order to get the wire up into the left renal pelvis.  This noted distorted and abnormal mucosa in the intravesical and ureterovesical junction. #3: The patient's right sided ureteral stent distal had migrated into the patient's vagina.  In order to remove the stent I had to perform ureteroscopy on the right ureter cannulating the right ureteral orifice and advancing it through the intramural ureter and beyond the distal ureter.  Once I was up beyond the fistula I passed the wire through the scope and into the right renal pelvis.  Once the wire was in the pelvis I removed the scope and pulled the stent through the patient's vagina. 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 #5: A 24 cm x 6 French double-J ureteral stent was placed in the right ureter. #6: A 22 cm x 5 French Polaris catheter was placed in the patient's left ureter    07/02/2018 Surgery    Procedure: 1. Robotic assisted laparoscopic bilateral ureteral reimplant  Surgeon: Ardis Hughs, MD First Assistant: Dr. [name], MD  Intraoperative findings:  #1: The patient's ureters were thickened, and adherent to the sidewalls, but there were able to be reimplanted without any tension. #2: The patient had a mid urethral sling mesh noted around the suprapubic bone that was unmolested.    08/27/2018 Pathology  Results    Vagina, biopsy, left upper vaginal side - wall - SQUAMOUS CELL CARCINOMA.    09/15/2018 Cancer Staging    Staging form: Cervix Uteri, AJCC 7th Edition - Pathologic: FIGO Stage IB1 (T1b1, N0, cM0) - Signed by Heath Lark, MD on 09/16/2018    09/27/2018 Procedure    Successful placement of a right IJ approach Power Port with ultrasound and fluoroscopic guidance. The catheter is ready for use.     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     REVIEW OF SYSTEMS:   Constitutional: Denies fevers, chills or abnormal weight loss Eyes: Denies blurriness of vision Ears, nose, mouth, throat, and face: Denies mucositis or sore throat Respiratory: Denies cough, dyspnea or wheezes Cardiovascular: Denies palpitation, chest discomfort or lower extremity swelling Gastrointestinal:  Denies nausea, heartburn or change in bowel habits Skin: Denies abnormal skin rashes Lymphatics: Denies new lymphadenopathy or easy bruising Neurological:Denies numbness, tingling or new weaknesses Behavioral/Psych: Mood is stable, no new changes  All other systems were reviewed with the patient and are negative.  I have reviewed the past medical history, past surgical history, social history and family history with the patient and they are unchanged from previous note.  ALLERGIES:  has No Known Allergies.  MEDICATIONS:  Current Outpatient Medications  Medication Sig Dispense Refill  . acetaminophen (TYLENOL) 500 MG tablet Take 2 tablets (1,000 mg total) by mouth every 6 (six) hours as needed for moderate pain, fever or headache. 30 tablet 0  . lidocaine-prilocaine (  EMLA) cream Apply to affected area once 30 g 3  . ondansetron (ZOFRAN) 8 MG tablet Take 1 tablet (8 mg total) by mouth every 8 (eight) hours as needed. Start on the third  day after chemotherapy. 30 tablet 1  . prochlorperazine (COMPAZINE) 10 MG tablet Take 1 tablet (10 mg total) by mouth every 6 (six) hours as needed (Nausea or vomiting). 30 tablet 1   No current facility-administered medications for this visit.     PHYSICAL EXAMINATION: ECOG PERFORMANCE STATUS: 0 - Asymptomatic  Vitals:   09/30/18 1401  BP: 111/71  Pulse: 88  Resp: 18  Temp: 98.1 F (36.7 C)  SpO2: 100%   Filed Weights   09/30/18 1401  Weight: 148 lb 3.2 oz (67.2 kg)    GENERAL:alert, no distress and comfortable SKIN: skin color, texture, turgor are normal, no rashes or significant lesions EYES: normal, Conjunctiva are pink and non-injected, sclera clear OROPHARYNX:no exudate, no erythema and lips, buccal mucosa, and tongue normal  NECK: supple, thyroid normal size, non-tender, without nodularity LYMPH:  no palpable lymphadenopathy in the cervical, axillary or inguinal LUNGS: clear to auscultation and percussion with normal breathing effort HEART: regular rate & rhythm and no murmurs and no lower extremity edema ABDOMEN:abdomen soft, non-tender and normal bowel sounds Musculoskeletal:no cyanosis of digits and no clubbing  NEURO: alert & oriented x 3 with fluent speech, no focal motor/sensory deficits  LABORATORY DATA:  I have reviewed the data as listed    Component Value Date/Time   NA 141 09/29/2018 0838   NA 140 05/04/2018 1657   K 4.0 09/29/2018 0838   CL 105 09/29/2018 0838   CO2 27 09/29/2018 0838   GLUCOSE 105 (H) 09/29/2018 0838   BUN 18 09/29/2018 0838   BUN 11 05/04/2018 1657   CREATININE 0.74 09/29/2018 0838   CALCIUM 9.6 09/29/2018 0838   PROT 8.1 09/29/2018 0838   PROT 7.3 05/04/2018 1657   ALBUMIN 4.1 09/29/2018 0838   ALBUMIN 4.3 05/04/2018 1657   AST 18 09/29/2018 0838   ALT 10 09/29/2018 0838   ALKPHOS 97 09/29/2018 0838   BILITOT 0.6 09/29/2018 0838   BILITOT <0.2 05/04/2018 1657   GFRNONAA >60 09/29/2018 0838   GFRAA >60 09/29/2018 0838     No results found for: SPEP, UPEP  Lab Results  Component Value Date   WBC 4.6 09/29/2018   NEUTROABS 2.5 09/29/2018   HGB 11.2 (L) 09/29/2018   HCT 34.9 (L) 09/29/2018   MCV 87.5 09/29/2018   PLT 256 09/29/2018      Chemistry      Component Value Date/Time   NA 141 09/29/2018 0838   NA 140 05/04/2018 1657   K 4.0 09/29/2018 0838   CL 105 09/29/2018 0838   CO2 27 09/29/2018 0838   BUN 18 09/29/2018 0838   BUN 11 05/04/2018 1657   CREATININE 0.74 09/29/2018 0838      Component Value Date/Time   CALCIUM 9.6 09/29/2018 0838   ALKPHOS 97 09/29/2018 0838   AST 18 09/29/2018 0838   ALT 10 09/29/2018 0838   BILITOT 0.6 09/29/2018 0838   BILITOT <0.2 05/04/2018 1657       RADIOGRAPHIC STUDIES: I have personally reviewed the radiological images as listed and agreed with the findings in the report. Nm Pet Image Restag (ps) Skull Base To Thigh  Result Date: 09/14/2018 CLINICAL DATA:  Subsequent treatment strategy for cervical cancer. Recurrent cervical cancer. EXAM: NUCLEAR MEDICINE PET SKULL BASE TO THIGH TECHNIQUE: 7.2  mCi F-18 FDG was injected intravenously. Full-ring PET imaging was performed from the skull base to thigh after the radiotracer. CT data was obtained and used for attenuation correction and anatomic localization. Fasting blood glucose: 102 mg/dl COMPARISON:  PET-CT 02/03/2018.  CT 11 13 19  FINDINGS: Mediastinal blood pool activity: SUV max 2.1 NECK: No hypermetabolic lymph nodes in the neck. Focal hypermetabolic nodule in the posterior aspect RIGHT thyroid gland again noted and unchanged. Findings consistent with thyroid adenoma versus thyroid carcinoma. Incidental CT findings: none CHEST: No hypermetabolic mediastinal or hilar nodes. No suspicious pulmonary nodules on the CT scan. Incidental CT findings: none ABDOMEN/PELVIS: There is focal activity along the LEFT side of the vaginal cuff which is difficult to tease out on the background of the intense radiotracer  activity within bladder. No hypermetabolic pelvic lymph nodes. No hypermetabolic periaortic lymph nodes. No abnormal activity in liver. Incidental CT findings: Post hysterectomy anatomy. No hydronephrosis hydroureter. SKELETON: No focal hypermetabolic activity to suggest skeletal metastasis. Incidental CT findings: none IMPRESSION: 1. No evidence of metastatic adenopathy in the abdomen or pelvis. No soft tissue metastasis. 2. Focal activity in the LEFT aspect of vaginal cuff consistent with local carcinoma recurrence. 3. Hypermetabolic nodule in the RIGHT lobe of thyroid gland. Differential includes thyroid adenoma versus thyroid carcinoma. Consider thyroid ultrasound. Electronically Signed   By: Suzy Bouchard M.D.   On: 09/14/2018 09:10   [place] Fna Bx Thyroid 1st Lesion Afirma  Result Date: 09/23/2018 INDICATION: Indeterminate thyroid nodule EXAM: ULTRASOUND GUIDED FINE NEEDLE ASPIRATION OF INDETERMINATE THYROID NODULE COMPARISON:  Thyroid ultrasound on 03/25/2018 MEDICATIONS: None COMPLICATIONS: None immediate. TECHNIQUE: Informed written consent was obtained from the patient after a discussion of the risks, benefits and alternatives to treatment. Questions regarding the procedure were encouraged and answered. A timeout was performed prior to the initiation of the procedure. Pre-procedural ultrasound scanning demonstrated unchanged size and appearance of the indeterminate nodule within the inferior right lobe of the thyroid gland. The procedure was planned. The neck was prepped in the usual sterile fashion, and a sterile drape was applied covering the operative field. A timeout was performed prior to the initiation of the procedure. Local anesthesia was provided with 1% lidocaine. Under direct ultrasound guidance, 5 FNA biopsies were performed with 25 gauge needles. Multiple ultrasound images were saved for procedural documentation purposes. The samples were prepared and submitted to pathology. Limited post  procedural scanning was negative for hematoma or additional complication. Dressings were placed. The patient tolerated the above procedures procedure well without immediate postprocedural complication. FINDINGS: Nodule reference number based on prior diagnostic ultrasound: 1 Maximum size: 1.2 cm Location: Right; Inferior ACR TI-RADS risk category: TR5 (>/= 7 points) Reason for biopsy: meets ACR TI-RADS criteria Ultrasound imaging confirms appropriate placement of the needles within the thyroid nodule. IMPRESSION: Technically successful ultrasound guided fine needle aspiration of 1.2 cm inferior right thyroid nodule. Electronically Signed   By: Aletta Edouard M.D.   On: 09/23/2018 15:51   Ir Imaging Guided Port Insertion  Result Date: 09/27/2018 INDICATION: 58 year old female with endo cervical cancer in need of durable venous access for chemotherapy. EXAM: IMPLANTED PORT A CATH PLACEMENT WITH ULTRASOUND AND FLUOROSCOPIC GUIDANCE MEDICATIONS: 2 g Ancef; The antibiotic was administered within an appropriate time interval prior to skin puncture. ANESTHESIA/SEDATION: Versed 2 mg IV; Fentanyl 100 mcg IV; Moderate Sedation Time:  21 minutes The patient was continuously monitored during the procedure by the interventional radiology nurse under my direct supervision. FLUOROSCOPY TIME:  0 minutes, 12  seconds (1 mGy) COMPLICATIONS: None immediate. PROCEDURE: The right neck and chest was prepped with chlorhexidine, and draped in the usual sterile fashion using maximum barrier technique (cap and mask, sterile gown, sterile gloves, large sterile sheet, hand hygiene and cutaneous antiseptic). Local anesthesia was attained by infiltration with 1% lidocaine with epinephrine. Ultrasound demonstrated patency of the right internal jugular vein, and this was documented with an image. Under real-time ultrasound guidance, this vein was accessed with a 21 gauge micropuncture needle and image documentation was performed. A small  dermatotomy was made at the access site with an 11 scalpel. A 0.018" wire was advanced into the SVC and the access needle exchanged for a 42F micropuncture vascular sheath. The 0.018" wire was then removed and a 0.035" wire advanced into the IVC. An appropriate location for the subcutaneous reservoir was selected below the clavicle and an incision was made through the skin and underlying soft tissues. The subcutaneous tissues were then dissected using a combination of blunt and sharp surgical technique and a pocket was formed. A single lumen power injectable portacatheter was then tunneled through the subcutaneous tissues from the pocket to the dermatotomy and the port reservoir placed within the subcutaneous pocket. The venous access site was then serially dilated and a peel away vascular sheath placed over the wire. The wire was removed and the port catheter advanced into position under fluoroscopic guidance. The catheter tip is positioned in the superior cavoatrial junction. This was documented with a spot image. The portacatheter was then tested and found to flush and aspirate well. The port was flushed with saline followed by 100 units/mL heparinized saline. The pocket was then closed in two layers using first subdermal inverted interrupted absorbable sutures followed by a running subcuticular suture. The epidermis was then sealed with Dermabond. The dermatotomy at the venous access site was also closed with Dermabond. IMPRESSION: Successful placement of a right IJ approach Power Port with ultrasound and fluoroscopic guidance. The catheter is ready for use. Electronically Signed   By: Jacqulynn Cadet M.D.   On: 09/27/2018 13:13    All questions were answered. The patient knows to call the clinic with any problems, questions or concerns. No barriers to learning was detected.  I spent 15 minutes counseling the patient face to face. The total time spent in the appointment was 20 minutes and more than 50% was  on counseling and review of test results  Heath Lark, MD 09/30/2018 3:37 PM

## 2018-10-01 ENCOUNTER — Ambulatory Visit
Admission: RE | Admit: 2018-10-01 | Discharge: 2018-10-01 | Disposition: A | Discharge status: Home / Self Care | Payer: BLUE CROSS/BLUE SHIELD | Source: Ambulatory Visit | Attending: Radiation Oncology | Admitting: Radiation Oncology

## 2018-10-01 ENCOUNTER — Encounter: Payer: Self-pay | Admitting: Oncology

## 2018-10-01 ENCOUNTER — Inpatient Hospital Stay: Payer: BLUE CROSS/BLUE SHIELD

## 2018-10-01 VITALS — BP 122/84 | HR 89 | Temp 98.2°F | Resp 16

## 2018-10-01 DIAGNOSIS — Z5111 Encounter for antineoplastic chemotherapy: Secondary | ICD-10-CM | POA: Diagnosis not present

## 2018-10-01 DIAGNOSIS — C539 Malignant neoplasm of cervix uteri, unspecified: Secondary | ICD-10-CM

## 2018-10-01 MED ORDER — SODIUM CHLORIDE 0.9 % IV SOLN
Freq: Once | INTRAVENOUS | Status: AC
  Administered 2018-10-01: 08:00:00 via INTRAVENOUS
  Filled 2018-10-01: qty 250

## 2018-10-01 MED ORDER — SODIUM CHLORIDE 0.9% FLUSH
10.0000 mL | INTRAVENOUS | Status: DC | PRN
  Administered 2018-10-01: 10 mL
  Filled 2018-10-01: qty 10

## 2018-10-01 MED ORDER — HEPARIN SOD (PORK) LOCK FLUSH 100 UNIT/ML IV SOLN
500.0000 [IU] | Freq: Once | INTRAVENOUS | Status: AC | PRN
  Administered 2018-10-01: 500 [IU]
  Filled 2018-10-01: qty 5

## 2018-10-01 MED ORDER — PALONOSETRON HCL INJECTION 0.25 MG/5ML
INTRAVENOUS | Status: AC
  Filled 2018-10-01: qty 5

## 2018-10-01 MED ORDER — PALONOSETRON HCL INJECTION 0.25 MG/5ML
0.2500 mg | Freq: Once | INTRAVENOUS | Status: AC
  Administered 2018-10-01: 0.25 mg via INTRAVENOUS

## 2018-10-01 MED ORDER — POTASSIUM CHLORIDE 2 MEQ/ML IV SOLN
Freq: Once | INTRAVENOUS | Status: AC
  Administered 2018-10-01: 08:00:00 via INTRAVENOUS
  Filled 2018-10-01: qty 10

## 2018-10-01 MED ORDER — SODIUM CHLORIDE 0.9 % IV SOLN
Freq: Once | INTRAVENOUS | Status: AC
  Administered 2018-10-01: 10:00:00 via INTRAVENOUS
  Filled 2018-10-01: qty 5

## 2018-10-01 MED ORDER — SODIUM CHLORIDE 0.9 % IV SOLN
40.0000 mg/m2 | Freq: Once | INTRAVENOUS | Status: AC
  Administered 2018-10-01: 66 mg via INTRAVENOUS
  Filled 2018-10-01: qty 66

## 2018-10-01 NOTE — Patient Instructions (Signed)
[facility] Discharge Instructions for Patients Receiving Chemotherapy  Today you received the following chemotherapy agents: Cisplatin.   To help prevent nausea and vomiting after your treatment, we encourage you to take your nausea medication as directed.   If you develop nausea and vomiting that is not controlled by your nausea medication, call the clinic.   BELOW ARE SYMPTOMS THAT SHOULD BE REPORTED IMMEDIATELY:  *FEVER GREATER THAN 100.5 F  *CHILLS WITH OR WITHOUT FEVER  NAUSEA AND VOMITING THAT IS NOT CONTROLLED WITH YOUR NAUSEA MEDICATION  *UNUSUAL SHORTNESS OF BREATH  *UNUSUAL BRUISING OR BLEEDING  TENDERNESS IN MOUTH AND THROAT WITH OR WITHOUT PRESENCE OF ULCERS  *URINARY PROBLEMS  *BOWEL PROBLEMS  UNUSUAL RASH Items with * indicate a potential emergency and should be followed up as soon as possible.  Feel free to call the clinic should you have any questions or concerns. The clinic phone number is [phone].  Please show the [place] at check-in to the Emergency Department and triage nurse.   Cisplatin injection Qu es este medicamento? El CISPLATINO es un agente quimioteraputico. Este medicamento acta sobre las clulas que se dividen rpidamente, como las clulas cancergenas, y finalmente provoca la muerte de estas clulas. Se utiliza en el tratamiento de muchos tipos de cncer, como los cnceres de vejiga, ovarios y testculos. Este medicamento puede ser utilizado para otros usos; si tiene alguna pregunta consulte con su proveedor de atencin mdica o con su farmacutico. MARCAS COMUNES: Platinol, Platinol -AQ Qu le debo informar a mi profesional de la salud antes de tomar este medicamento? Necesita saber si usted presenta alguno de los siguientes problemas o situaciones: -trastornos sanguneos -problemas auditivos -enfermedad renal -radioterapia reciente o continuada -una reaccin alrgica o inusual al cisplatino, al  carboplatino, a otros agentes quimioteraputicos, a otros medicamentos, alimentos, colorantes o conservantes -si est embarazada o buscando quedar embarazada -si est amamantando a un beb Cmo debo utilizar este medicamento? Este medicamento se administra mediante infusin por va intravenosa. Lo administra un profesional de la salud calificado en un hospital o en un entorno clnico. Hable con su pediatra para informarse acerca del uso de este medicamento en nios. Puede requerir atencin especial. Sobredosis: Pngase en contacto inmediatamente con un centro toxicolgico o una sala de urgencia si usted cree que haya tomado demasiado medicamento. ATENCIN: [employer] es solo para usted. No comparta este medicamento con nadie. Qu sucede si me olvido de una dosis? Es importante no olvidar ninguna dosis. Informe a su mdico o a su profesional de la salud si no puede asistir a Photographer. Qu puede interactuar con este medicamento? -dofetilida -foscarnet -medicamentos para convulsiones -medicamentos para incrementar los conteos sanguneos, tales como filgrastim, pegfilgrastim, sargramostim -probenecid -piridoxina usado con altretamina -rituximab -ciertos antibiticos, tales como amicacina, gentamicina, neomicina, polimixina B, estreptomicina, tobramicina -sulfinpirazona -vacunas -zalcitabina Consulte a su mdico o a su profesional de la salud antes de tomar cualquiera de los siguientes medicamentos: -acetaminofeno -aspirina -ibuprofeno -quetoprofeno -naproxeno Puede ser que esta lista no menciona todas las posibles interacciones. Informe a su profesional de [employer]	[place] de [employer] productos a base de hierbas, medicamentos de [place] o suplementos nutritivos que est tomando. Si usted fuma, consume bebidas alcohlicas o si utiliza drogas ilegales, indqueselo tambin a su profesional de [employer]	[place]. Algunas sustancias pueden interactuar con su medicamento. A qu debo estar atento al  usar [employer]? Se supervisar su condicin atentamente mientras reciba este medicamento. Tendr que hacerse anlisis de sangre peridicos [place]  est tomando [employer]. Este medicamento puede hacerle sentir un Nurse, mental health. Esto es normal ya que la quimioterapia afecta tanto a las clulas sanas como a las clulas cancerosas. Si presenta alguno de los [employer], infrmelos. Sin embargo, contine con el tratamiento aun si se siente enfermo, a menos que su mdico le indique que lo suspenda. En algunos casos, podr recibir [employer] para ayudarle con los efectos secundarios. Siga las instrucciones para usarlos. Consulte a su mdico o a su profesional de la salud por asesoramiento si tiene fiebre, escalofros, dolor de garganta o cualquier otro sntoma de resfro o gripe. No se trate usted mismo. Este medicamento puede reducir la capacidad del cuerpo para combatir infecciones. Trate de no acercarse a personas que estn enfermas. [employer] puede aumentar el riesgo de magulladuras o sangrado. Consulte a su mdico o a su profesional de la salud si observa sangrados inusuales. Proceda con cuidado al cepillar sus dientes, usar hilo dental o Risk manager palillos para los dientes, ya que puede contraer una infeccin o Therapist, art con mayor facilidad. Si se somete a algn tratamiento dental, informe a su dentista que est [employer]. Evite tomar productos que contienen aspirina, acetaminofeno, ibuprofeno, naproxeno o quetoprofeno a menos que as lo indique su mdico. Estos productos pueden disimular la fiebre. No se debe quedar embarazada mientras recibe este medicamento. Las mujeres deben informar a su mdico si estn buscando quedar embarazadas o si creen que estn embarazadas. Existe la posibilidad de efectos secundarios graves a un beb sin nacer. Para ms informacin hable con su profesional de la salud o su farmacutico. No debe Economist a un beb mientras  est usando este medicamento. Mientras recibe [employer], beba lquido como le haya indicado. Esto ayudar a [employer]. Si tiene diarrea, llame a su mdico o a su profesional de [employer]	[place]. No se trate usted mismo. Qu efectos secundarios puedo tener al [employer] este medicamento? Efectos secundarios que debe informar a su mdico o a Barrister's clerk de la salud tan pronto como sea posible: -Chief of Staff como erupcin cutnea, picazn o urticarias, hinchazn de la cara, labios o lengua -signos de infeccin - fiebre o escalofros, tos, dolor de garganta, dolor o dificultad para orinar -signos de reduccin de plaquetas o sangrado - magulladuras, puntos rojos en la piel, heces de color oscuro o con aspecto alquitranado, sangrado por la nariz -signos de reduccin de glbulos rojos - cansancio o debilidad inusual, desmayos, sensacin de [employer] -problemas respiratorios -cambios de audicin -dolor de gota -conteos sanguneos bajos - este medicamento puede reducir la cantidad de glbulos blancos, glbulos rojos y Art gallery manager. Su riesgo de infeccin y sangrado puede ser mayor. -nuseas, vmito -dolor, hinchazn, enrojecimiento o Actor de la inyeccin -dolor, hormigueo, entumecimiento de manos o pies -problemas de coordinacin, de movimiento -dificultad para orinar o cambios en el volumen de orina Efectos secundarios que, por lo general, no requieren atencin mdica (debe informarlos a su mdico o a su profesional de la salud si persisten o si son molestos): -cambios en la visin -prdida del apetito -sabor metlico en la boca o cambios en el sentido del gusto Puede ser que esta lista no menciona todos los posibles efectos secundarios. Comunquese a su mdico por asesoramiento mdico [employer] Usted puede informar los efectos secundarios a la FDA por telfono al 1-800-FDA-1088. Dnde debo guardar mi medicina? Este medicamento se administra en  hospitales o clnicas y no necesitar guardarlo en su domicilio. ATENCIN: Este folleto es  un resumen. Puede ser que no cubra toda la posible informacin. Si usted tiene preguntas acerca de esta medicina, consulte con su mdico, su farmacutico o su profesional de Technical sales engineer.  2019 Elsevier/Gold Standard (2014-09-12 00:00:00)

## 2018-10-04 ENCOUNTER — Ambulatory Visit
Admission: RE | Admit: 2018-10-04 | Discharge: 2018-10-04 | Disposition: A | Discharge status: Home / Self Care | Payer: BLUE CROSS/BLUE SHIELD | Source: Ambulatory Visit | Attending: Radiation Oncology | Admitting: Radiation Oncology

## 2018-10-04 ENCOUNTER — Telehealth: Payer: Self-pay

## 2018-10-04 DIAGNOSIS — Z510 Encounter for antineoplastic radiation therapy: Secondary | ICD-10-CM | POA: Insufficient documentation

## 2018-10-04 DIAGNOSIS — C539 Malignant neoplasm of cervix uteri, unspecified: Secondary | ICD-10-CM | POA: Insufficient documentation

## 2018-10-04 NOTE — Telephone Encounter (Signed)
-----   Message from Rennis Harding, RN sent at 10/01/2018 12:23 PM EST ----- Regarding: Dr. Alvy Bimler 1st time chemo f/u 1st time follow up for Cisplatin - pt tolerated well

## 2018-10-04 NOTE — Telephone Encounter (Signed)
Called and spoke with Almyra Free. She will talk with Ms. Georgiades and call the office back.

## 2018-10-04 NOTE — Telephone Encounter (Signed)
Almyra Free translator came by the office. She called Ms. Borys and she is doing well. With no complaints.

## 2018-10-05 ENCOUNTER — Ambulatory Visit
Admission: RE | Admit: 2018-10-05 | Discharge: 2018-10-05 | Disposition: A | Discharge status: Home / Self Care | Payer: BLUE CROSS/BLUE SHIELD | Source: Ambulatory Visit | Attending: Radiation Oncology | Admitting: Radiation Oncology

## 2018-10-05 DIAGNOSIS — Z510 Encounter for antineoplastic radiation therapy: Secondary | ICD-10-CM | POA: Diagnosis not present

## 2018-10-06 ENCOUNTER — Ambulatory Visit
Admission: RE | Admit: 2018-10-06 | Discharge: 2018-10-06 | Disposition: A | Discharge status: Home / Self Care | Payer: BLUE CROSS/BLUE SHIELD | Source: Ambulatory Visit | Attending: Radiation Oncology | Admitting: Radiation Oncology

## 2018-10-06 ENCOUNTER — Inpatient Hospital Stay: Payer: BLUE CROSS/BLUE SHIELD | Attending: Gynecologic Oncology

## 2018-10-06 DIAGNOSIS — C531 Malignant neoplasm of exocervix: Secondary | ICD-10-CM

## 2018-10-06 DIAGNOSIS — Z510 Encounter for antineoplastic radiation therapy: Secondary | ICD-10-CM | POA: Diagnosis not present

## 2018-10-06 LAB — COMPREHENSIVE METABOLIC PANEL
ALK PHOS: 94 U/L (ref 38–126)
ALT: 18 U/L (ref 0–44)
ANION GAP: 6 (ref 5–15)
AST: 18 U/L (ref 15–41)
Albumin: 4.5 g/dL (ref 3.5–5.0)
BUN: 15 mg/dL (ref 6–20)
CALCIUM: 9.3 mg/dL (ref 8.9–10.3)
CO2: 30 mmol/L (ref 22–32)
Chloride: 100 mmol/L (ref 98–111)
Creatinine, Ser: 0.59 mg/dL (ref 0.44–1.00)
GFR calc Af Amer: >60 mL/min (ref >60)
GFR calc non Af Amer: >60 mL/min (ref >60)
Glucose, Bld: 90 mg/dL (ref 70–99)
Potassium: 3.8 mmol/L (ref 3.5–5.1)
Sodium: 136 mmol/L (ref 135–145)
Total Bilirubin: 0.1 mg/dL — ABNORMAL LOW (ref 0.3–1.2)
Total Protein: 8.1 g/dL (ref 6.5–8.1)

## 2018-10-06 LAB — MAGNESIUM: Magnesium: 2.1 mg/dL (ref 1.7–2.4)

## 2018-10-06 LAB — CBC WITH DIFFERENTIAL/PLATELET
Abs Immature Granulocytes: 0.01 10*3/uL (ref 0.00–0.07)
BASOS PCT: 0 %
Basophils Absolute: 0 10*3/uL (ref 0.0–0.1)
EOS ABS: 0.1 10*3/uL (ref 0.0–0.5)
Eosinophils Relative: 4 %
HCT: 35.7 % — ABNORMAL LOW (ref 36.0–46.0)
Hemoglobin: 11.6 g/dL — ABNORMAL LOW (ref 12.0–15.0)
Immature Granulocytes: 0 %
Lymphocytes Relative: 29 %
Lymphs Abs: 0.9 10*3/uL (ref 0.7–4.0)
MCH: 28.3 pg (ref 26.0–34.0)
MCHC: 32.5 g/dL (ref 30.0–36.0)
MCV: 87.1 fL (ref 80.0–100.0)
Monocytes Absolute: 0.3 10*3/uL (ref 0.1–1.0)
Monocytes Relative: 9 %
Neutro Abs: 1.8 10*3/uL (ref 1.7–7.7)
Neutrophils Relative %: 58 %
Platelets: 254 10*3/uL (ref 150–400)
RBC: 4.1 MIL/uL (ref 3.87–5.11)
RDW: 14.7 % (ref 11.5–15.5)
WBC: 3.2 10*3/uL — ABNORMAL LOW (ref 4.0–10.5)
nRBC: 0 % (ref 0.0–0.2)

## 2018-10-07 ENCOUNTER — Encounter: Payer: Self-pay | Admitting: Hematology and Oncology

## 2018-10-07 ENCOUNTER — Ambulatory Visit
Admission: RE | Admit: 2018-10-07 | Discharge: 2018-10-07 | Disposition: A | Discharge status: Home / Self Care | Payer: BLUE CROSS/BLUE SHIELD | Source: Ambulatory Visit | Attending: Radiation Oncology | Admitting: Radiation Oncology

## 2018-10-07 ENCOUNTER — Inpatient Hospital Stay (HOSPITAL_BASED_OUTPATIENT_CLINIC_OR_DEPARTMENT_OTHER): Payer: BLUE CROSS/BLUE SHIELD | Admitting: Hematology and Oncology

## 2018-10-07 ENCOUNTER — Other Ambulatory Visit: Payer: BLUE CROSS/BLUE SHIELD

## 2018-10-07 DIAGNOSIS — T451X5A Adverse effect of antineoplastic and immunosuppressive drugs, initial encounter: Secondary | ICD-10-CM

## 2018-10-07 DIAGNOSIS — Z510 Encounter for antineoplastic radiation therapy: Secondary | ICD-10-CM | POA: Diagnosis not present

## 2018-10-07 DIAGNOSIS — R339 Retention of urine, unspecified: Secondary | ICD-10-CM | POA: Diagnosis not present

## 2018-10-07 DIAGNOSIS — C539 Malignant neoplasm of cervix uteri, unspecified: Secondary | ICD-10-CM | POA: Diagnosis not present

## 2018-10-07 DIAGNOSIS — D61818 Other pancytopenia: Secondary | ICD-10-CM | POA: Diagnosis not present

## 2018-10-07 DIAGNOSIS — C73 Malignant neoplasm of thyroid gland: Secondary | ICD-10-CM | POA: Diagnosis not present

## 2018-10-07 DIAGNOSIS — G620 Drug-induced polyneuropathy: Secondary | ICD-10-CM

## 2018-10-07 DIAGNOSIS — R634 Abnormal weight loss: Secondary | ICD-10-CM

## 2018-10-07 NOTE — Assessment & Plan Note (Signed)
She tolerated treatment well except for mild weight loss, tingling sensation and mild pancytopenia I will continue to see her on a weekly basis for further supportive care We will proceed with treatment tomorrow without dose adjustment

## 2018-10-07 NOTE — Assessment & Plan Note (Signed)
She has mild pancytopenia due to recent chemotherapy She is not symptomatic Observe only for now If her CBC is worse next week, I will consider dose adjustment

## 2018-10-07 NOTE — Assessment & Plan Note (Signed)
She has unintentional weight loss of almost 5 pounds since last time I saw her She has frequent bowel movement/diarrhea I recommend nutritional supplement as tolerated and to increase hydration as tolerated

## 2018-10-07 NOTE — Assessment & Plan Note (Signed)
she has mild peripheral neuropathy, likely related to side effects of treatment. It is only mild, not bothering the patient. I will observe for now If it gets worse in the future, I will consider modifying the dose of the treatment  

## 2018-10-07 NOTE — Progress Notes (Signed)
[facility] OFFICE PROGRESS NOTE  Patient Care Team: Antony Blackbird, MD as PCP - General (Family Medicine) [name], RN as Oncology Nurse Navigator (Oncology)  ASSESSMENT & PLAN:  Recurrent cervical cancer [facility]) She tolerated treatment well except for mild weight loss, tingling sensation and mild pancytopenia I will continue to see her on a weekly basis for further supportive care We will proceed with treatment tomorrow without dose adjustment  Pancytopenia, acquired ([place]) She has mild pancytopenia due to recent chemotherapy She is not symptomatic Observe only for now If her CBC is worse next week, I will consider dose adjustment  Urinary retention This is stable.  Weight loss, non-intentional She has unintentional weight loss of almost 5 pounds since last time I saw her She has frequent bowel movement/diarrhea I recommend nutritional supplement as tolerated and to increase hydration as tolerated  Peripheral neuropathy due to chemotherapy [facility]) she has mild peripheral neuropathy, likely related to side effects of treatment. It is only mild, not bothering the patient. I will observe for now If it gets worse in the future, I will consider modifying the dose of the treatment    No orders of the defined types were placed in this encounter.   INTERVAL HISTORY: Please see below for problem oriented charting. Spanish interpreter is present She is seen as part of her weekly chemotherapy follow-up She tolerated treatment well Denies nausea or vomiting She has very mild tingling sensation at the tips of fingers and toes, resolved She noted mild increased bowel movement Urinary retention is stable Her energy level is fair Denies recent fever  SUMMARY OF ONCOLOGIC HISTORY: Oncology History   Recurrent cervical cancer to the vagina HPV positive from 2019 specimen     Recurrent cervical cancer ([place])   06/11/2011 Pathology Results    1. Endocervix,  curettage DETACHED FRAGMENTS OF SQUAMOUS MUCOSA WITH KOILOCYTIC ATYPIA, SEE COMMENT. 2. Cervix, biopsy LOW GRADE SQUAMOUS INTRAEPITHELIAL LESION, CIN-I (MILD DYSPLASIA), SEE COMMENT. Microscopic Comment 1. 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 2. The findings    01/09/2012 Pathology Results    LOW GRADE SQUAMOUS INTRAEPITHELIAL LESION: CIN-1/ HPV (LSIL). ENDOMETRIAL CELLS ARE PRESENT.    01/12/2018 Pathology Results    Cervix, biopsy - INVASIVE SQUAMOUS CELL CARCINOMA - SEE COMMENT    02/08/2018 Pathology Results    1. Lymph nodes, regional resection, right pelvic - EIGHT LYMPH NODES, NEGATIVE FOR CARCINOMA (0/8). 2. Lymph nodes, regional resection, left pelvic - TWELVE LYMPH NODES, NEGATIVE FOR CARCINOMA (0/12). 3. Uterus +/- tubes/ovaries, neoplastic, cervix, bilateral tubes and ovaries, upper third of vagina - INVASIVE MODERATELY DIFFERENTIATED SQUAMOUS CELL CARCINOMA, 2.6 CM, INVOLVING ANTERIOR PORTION OF CERVIX FROM 9 O'CLOCK TO 3 O'CLOCK. - TUMOR INVADES FOR DEPTH OF 0.5 CM AND IS CONFINED TO THE CERVIX. - ALL RESECTION MARGINS ARE NEGATIVE FOR CARCINOMA; THE CLOSEST IS THE VAGINAL CUFF MARGIN AT 1 CM. - NEGATIVE FOR LYMPHOVASCULAR OR PERINEURAL INVASION. - BENIGN ENDOMETRIAL POLYP, 2.6 CM. - BENIGN LEIOMYOMA, 2.2 CM - BENIGN INACTIVE ENDOMETRIUM. - BENIGN BILATERAL OVARIES AND FALLOPIAN TUBES. - SEE ONCOLOGY TABLE. Microscopic Comment 3. UTERINE CERVIX: Resection Procedure: Radial hysterectomy. Tumor Size: 2.6 cm. Histologic Type: Squamous cell carcinoma. Histologic Grade: G2: Moderately differentiated. Stromal Invasion: Depth of stromal invasion (millimeters): 5 mm. Horizontal extent longitudinal/length (if applicable#) (millimeters): 20 mm. Horizontal extent circumferential/width (if applicable#) (millimeters): 26 mm. Other Tissue/ Organ: Not involved. Margins: Negative for carcinoma. Lymphovascular Invasion: Not identified. Regional  Lymph Nodes: All lymph nodes  negative for tumor cells Total Number of Lymph Nodes Examined: 20. Number of Sentinel Nodes Examined (if applicable): 0. Pathologic Stage Classification (pTNM, AJCC 8th Edition): pT1b1, pN0. Ancillary Studies: Not applicable. Representative Tumor Block: 3E, 13F, and 3G.    02/08/2018 Surgery    Operation: Robotic-assisted type III radical laparoscopic hysterectomy with bilateral salpingoophorectomy and bilateral pelvic lymphadenectomy  Surgeon: Donaciano Eva  Operative Findings:  : 2-3cm pedunculated exophytic mass from right anterior cervix. No clinical involvement of the parametria, no suspicious nodes    02/16/2018 Surgery    Procedure: 1. Cystoscopy, bilateral retrograde pyelograms with interpretation 2. Diagnostic ureteroscopy 3. Bilateral ureteral stent placement  Surgeon: Ardis Hughs, MD  Intraoperative findings:  #1: The retrograde pyelogram on the patient's right side initially demonstrated significant contrast extravasation several centimeters from the ureteral orifice.  There was proximal hydroureteronephrosis. #2: Ureteroscopy of the right ureter demonstrated a full-thickness ureteral injury likely thermal in nature approximately 2-1/2 cm from the UVJ. #3: The retrograde on the patient's left side demonstrated severe extravasation with no contrast getting beyond the first centimeter of the ureter.  Once beyond this area with a ureteroscope the ureter was mild to moderately dilated. 4.:  Ureteroscopy of the left ureter demonstrated an area approximately 2 cm from the UVJ and lasting approximately a centimeter and a half of devitalized tissue, full-thickness, likely thermal in nature. #5: 5 French x22 cm Polaris stents were placed bilaterally.     03/22/2018 Imaging    Highly suspicious nodule in the inferior right thyroid lobe corresponds with the hypermetabolic activity on the PET-CT. Recommend ultrasound-guided biopsy of this  right thyroid nodule.  No other thyroid nodules.    04/22/2018 Surgery    Procedure: 4. Cystoscopy 5. Bilateral retrograde pyelogram with interpretation 6. Bilateral ureteral stent exchange 7. Left ureteroscopy, diagnostic  Surgeon: Ardis Hughs, MD  Intraoperative findings:  #1: On speculum exam there was a tine from the right ureteral stent (Polaris) noted at the vaginal cuff.  The remainder of the vaginal cuff was healed, including the area around the ureteral stent tine. 2.:  The right retrograde pyelogram was performed using 10 cc of Omni, and demonstrated normal caliber ureter with no significant hydroureteronephrosis.  There did not appear to be any communication or extravasation of contrast in the distal ureter or UVJ with the vagina. 3.:  The left retrograde pyelogram demonstrated a small area of extravasation at the UVJ, nearly the intramural ureter, that seemed to communicate with the patient's vagina.  There were no other significant filling defects, there is no hydroureteronephrosis. 4.:  Left diagnostic ureteroscopy demonstrated an abnormality within the intramural or very distal UVJ region where there was some bullous edema and likely the site of the fistula.  There was no identifiable communication.  The remaining aspect of the ureter was normal.    05/28/2018 Surgery    Procedure: 8. Bilateral retrograde pyelogram with interpretation 9. Bilateral diagnostic ureteroscopy 10. Bilateral ureteral stent exchange 11. Bilateral nephrostomy tube removal  Surgeon: Ardis Hughs, MD  Intraoperative findings:  #1: 5 French open-ended ureteral catheter was used to perform a retrograde pyelogram in the patient's left ureter which demonstrated normal caliber ureter with no hydro-.  However, there was a narrowed segment at the intramural ureter and just at the UVJ with a prominent fistula draining into the vagina. #2: I removed the wire to see the how well the  ureter with drain, it did not drain well at all.  As such  I tried to repassed the wire and ultimately had to pass a ureteroscope through the distal ureter in order to get the wire up into the left renal pelvis.  This noted distorted and abnormal mucosa in the intravesical and ureterovesical junction. #3: The patient's right sided ureteral stent distal had migrated into the patient's vagina.  In order to remove the stent I had to perform ureteroscopy on the right ureter cannulating the right ureteral orifice and advancing it through the intramural ureter and beyond the distal ureter.  Once I was up beyond the fistula I passed the wire through the scope and into the right renal pelvis.  Once the wire was in the pelvis I removed the scope and pulled the stent through the patient's vagina. 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 #5: A 24 cm x 6 French double-J ureteral stent was placed in the right ureter. #6: A 22 cm x 5 French Polaris catheter was placed in the patient's left ureter    07/02/2018 Surgery    Procedure: 1. Robotic assisted laparoscopic bilateral ureteral reimplant  Surgeon: Ardis Hughs, MD First Assistant: Dr. [name], MD  Intraoperative findings:  #1: The patient's ureters were thickened, and adherent to the sidewalls, but there were able to be reimplanted without any tension. #2: The patient had a mid urethral sling mesh noted around the suprapubic bone that was unmolested.    08/27/2018 Pathology Results    Vagina, biopsy, left upper vaginal side - wall - SQUAMOUS CELL CARCINOMA.    09/15/2018 Cancer Staging    Staging form: Cervix Uteri, AJCC 7th Edition - Pathologic: FIGO Stage IB1 (T1b1, N0, cM0) - Signed by Heath Lark, MD on 09/16/2018    09/27/2018 Procedure    Successful placement of a  right IJ approach Power Port with ultrasound and fluoroscopic guidance. The catheter is ready for use.    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     REVIEW OF SYSTEMS:   Constitutional: Denies fevers, chills or abnormal weight loss Eyes: Denies blurriness of vision Ears, nose, mouth, throat, and face: Denies mucositis or sore throat Respiratory: Denies cough, dyspnea or wheezes Cardiovascular: Denies palpitation, chest discomfort or lower extremity swelling Skin: Denies abnormal skin rashes Lymphatics: Denies new lymphadenopathy or easy bruising NeurBehavioral/Psych: Mood is stable, no new changes  All other systems were reviewed with the patient and are negative.  I have reviewed the past medical history, past surgical history, social history and family history with the patient and they are unchanged from previous note.  ALLERGIES:  has No Known Allergies.  MEDICATIONS:  Current Outpatient Medications  Medication Sig Dispense Refill  . acetaminophen (TYLENOL) 500 MG tablet Take 2 tablets (1,000 mg total) by mouth every 6 (six) hours as needed for moderate pain, fever or headache. 30 tablet 0  . lidocaine-prilocaine (EMLA) cream Apply to affected area once 30 g 3  . ondansetron (ZOFRAN) 8 MG tablet Take 1 tablet (8 mg total) by mouth every 8 (eight) hours as needed. Start on  the third day after chemotherapy. 30 tablet 1  . prochlorperazine (COMPAZINE) 10 MG tablet Take 1 tablet (10 mg total) by mouth every 6 (six) hours as needed (Nausea or vomiting). 30 tablet 1   No current facility-administered medications for this visit.     PHYSICAL EXAMINATION: ECOG PERFORMANCE STATUS:  1 - Symptomatic but completely ambulatory  Vitals:   10/07/18 1246  BP: 107/68  Pulse: 93  Resp: 18  Temp: 98.4 F (36.9 C)  SpO2: 100%   Filed Weights   10/07/18 1246  Weight: 143 lb 12.8 oz (65.2 kg)    GENERAL:alert, no distress and comfortable SKIN: skin color, texture, turgor are normal, no rashes or significant lesions EYES: normal, Conjunctiva are pink and non-injected, sclera clear OROPHARYNX:no exudate, no erythema and lips, buccal mucosa, and tongue normal  NECK: supple, thyroid normal size, non-tender, without nodularity LYMPH:  no palpable lymphadenopathy in the cervical, axillary or inguinal LUNGS: clear to auscultation and percussion with normal breathing effort HEART: regular rate & rhythm and no murmurs and no lower extremity edema ABDOMEN:abdomen soft, non-tender and normal bowel sounds Musculoskeletal:no cyanosis of digits and no clubbing  NEURO: alert & oriented x 3 with fluent speech, no focal motor/sensory deficits  LABORATORY DATA:  I have reviewed the data as listed    Component Value Date/Time   NA 136 10/06/2018 1304   NA 140 05/04/2018 1657   K 3.8 10/06/2018 1304   CL 100 10/06/2018 1304   CO2 30 10/06/2018 1304   GLUCOSE 90 10/06/2018 1304   BUN 15 10/06/2018 1304   BUN 11 05/04/2018 1657   CREATININE 0.59 10/06/2018 1304   CALCIUM 9.3 10/06/2018 1304   PROT 8.1 10/06/2018 1304   PROT 7.3 05/04/2018 1657   ALBUMIN 4.5 10/06/2018 1304   ALBUMIN 4.3 05/04/2018 1657   AST 18 10/06/2018 1304   ALT 18 10/06/2018 1304   ALKPHOS 94 10/06/2018 1304   BILITOT 0.1 (L) 10/06/2018 1304   BILITOT <0.2 05/04/2018 1657   GFRNONAA >60 10/06/2018 1304   GFRAA >60 10/06/2018 1304    No results found for: SPEP, UPEP  Lab Results  Component Value Date   WBC 3.2 (L) 10/06/2018   NEUTROABS 1.8 10/06/2018   HGB 11.6 (L) 10/06/2018   HCT 35.7 (L) 10/06/2018   MCV 87.1 10/06/2018   PLT 254 10/06/2018      Chemistry      Component Value Date/Time    NA 136 10/06/2018 1304   NA 140 05/04/2018 1657   K 3.8 10/06/2018 1304   CL 100 10/06/2018 1304   CO2 30 10/06/2018 1304   BUN 15 10/06/2018 1304   BUN 11 05/04/2018 1657   CREATININE 0.59 10/06/2018 1304      Component Value Date/Time   CALCIUM 9.3 10/06/2018 1304   ALKPHOS 94 10/06/2018 1304   AST 18 10/06/2018 1304   ALT 18 10/06/2018 1304   BILITOT 0.1 (L) 10/06/2018 1304   BILITOT <0.2 05/04/2018 1657       RADIOGRAPHIC STUDIES: I have personally reviewed the radiological images as listed and agreed with the findings in the report. Nm Pet Image Restag (ps) Skull Base To Thigh  Result Date: 09/14/2018 CLINICAL DATA:  Subsequent treatment strategy for cervical cancer. Recurrent cervical cancer. EXAM: NUCLEAR MEDICINE PET SKULL BASE TO THIGH TECHNIQUE: 7.2 mCi F-18 FDG was injected intravenously. Full-ring PET imaging was performed from the skull base to thigh after the radiotracer. CT data was obtained and used for attenuation  correction and anatomic localization. Fasting blood glucose: 102 mg/dl COMPARISON:  PET-CT 02/03/2018.  CT 11 13 19  FINDINGS: Mediastinal blood pool activity: SUV max 2.1 NECK: No hypermetabolic lymph nodes in the neck. Focal hypermetabolic nodule in the posterior aspect RIGHT thyroid gland again noted and unchanged. Findings consistent with thyroid adenoma versus thyroid carcinoma. Incidental CT findings: none CHEST: No hypermetabolic mediastinal or hilar nodes. No suspicious pulmonary nodules on the CT scan. Incidental CT findings: none ABDOMEN/PELVIS: There is focal activity along the LEFT side of the vaginal cuff which is difficult to tease out on the background of the intense radiotracer activity within bladder. No hypermetabolic pelvic lymph nodes. No hypermetabolic periaortic lymph nodes. No abnormal activity in liver. Incidental CT findings: Post hysterectomy anatomy. No hydronephrosis hydroureter. SKELETON: No focal hypermetabolic activity to suggest  skeletal metastasis. Incidental CT findings: none IMPRESSION: 1. No evidence of metastatic adenopathy in the abdomen or pelvis. No soft tissue metastasis. 2. Focal activity in the LEFT aspect of vaginal cuff consistent with local carcinoma recurrence. 3. Hypermetabolic nodule in the RIGHT lobe of thyroid gland. Differential includes thyroid adenoma versus thyroid carcinoma. Consider thyroid ultrasound. Electronically Signed   By: Suzy Bouchard M.D.   On: 09/14/2018 09:10   [place] Fna Bx Thyroid 1st Lesion Afirma  Result Date: 09/23/2018 INDICATION: Indeterminate thyroid nodule EXAM: ULTRASOUND GUIDED FINE NEEDLE ASPIRATION OF INDETERMINATE THYROID NODULE COMPARISON:  Thyroid ultrasound on 03/25/2018 MEDICATIONS: None COMPLICATIONS: None immediate. TECHNIQUE: Informed written consent was obtained from the patient after a discussion of the risks, benefits and alternatives to treatment. Questions regarding the procedure were encouraged and answered. A timeout was performed prior to the initiation of the procedure. Pre-procedural ultrasound scanning demonstrated unchanged size and appearance of the indeterminate nodule within the inferior right lobe of the thyroid gland. The procedure was planned. The neck was prepped in the usual sterile fashion, and a sterile drape was applied covering the operative field. A timeout was performed prior to the initiation of the procedure. Local anesthesia was provided with 1% lidocaine. Under direct ultrasound guidance, 5 FNA biopsies were performed with 25 gauge needles. Multiple ultrasound images were saved for procedural documentation purposes. The samples were prepared and submitted to pathology. Limited post procedural scanning was negative for hematoma or additional complication. Dressings were placed. The patient tolerated the above procedures procedure well without immediate postprocedural complication. FINDINGS: Nodule reference number based on prior diagnostic ultrasound:  1 Maximum size: 1.2 cm Location: Right; Inferior ACR TI-RADS risk category: TR5 (>/= 7 points) Reason for biopsy: meets ACR TI-RADS criteria Ultrasound imaging confirms appropriate placement of the needles within the thyroid nodule. IMPRESSION: Technically successful ultrasound guided fine needle aspiration of 1.2 cm inferior right thyroid nodule. Electronically Signed   By: Aletta Edouard M.D.   On: 09/23/2018 15:51   [facility] Guided Port Insertion  Result Date: 09/27/2018 INDICATION: 58 year old female with endo cervical cancer in need of durable venous access for chemotherapy. EXAM: IMPLANTED PORT A CATH PLACEMENT WITH ULTRASOUND AND FLUOROSCOPIC GUIDANCE MEDICATIONS: 2 g Ancef; The antibiotic was administered within an appropriate time interval prior to skin puncture. ANESTHESIA/SEDATION: Versed 2 mg IV; Fentanyl 100 mcg IV; Moderate Sedation Time:  21 minutes The patient was continuously monitored during the procedure by the interventional radiology nurse under my direct supervision. FLUOROSCOPY TIME:  0 minutes, 12 seconds (1 mGy) COMPLICATIONS: None immediate. PROCEDURE: The right neck and chest was prepped with chlorhexidine, and draped in the usual sterile fashion using maximum barrier technique (cap  and mask, sterile gown, sterile gloves, large sterile sheet, hand hygiene and cutaneous antiseptic). Local anesthesia was attained by infiltration with 1% lidocaine with epinephrine. Ultrasound demonstrated patency of the right internal jugular vein, and this was documented with an image. Under real-time ultrasound guidance, this vein was accessed with a 21 gauge micropuncture needle and image documentation was performed. A small dermatotomy was made at the access site with an 11 scalpel. A 0.018" wire was advanced into the SVC and the access needle exchanged for a 98F micropuncture vascular sheath. The 0.018" wire was then removed and a 0.035" wire advanced into the IVC. An appropriate location for the  subcutaneous reservoir was selected below the clavicle and an incision was made through the skin and underlying soft tissues. The subcutaneous tissues were then dissected using a combination of blunt and sharp surgical technique and a pocket was formed. A single lumen power injectable portacatheter was then tunneled through the subcutaneous tissues from the pocket to the dermatotomy and the port reservoir placed within the subcutaneous pocket. The venous access site was then serially dilated and a peel away vascular sheath placed over the wire. The wire was removed and the port catheter advanced into position under fluoroscopic guidance. The catheter tip is positioned in the superior cavoatrial junction. This was documented with a spot image. The portacatheter was then tested and found to flush and aspirate well. The port was flushed with saline followed by 100 units/mL heparinized saline. The pocket was then closed in two layers using first subdermal inverted interrupted absorbable sutures followed by a running subcuticular suture. The epidermis was then sealed with Dermabond. The dermatotomy at the venous access site was also closed with Dermabond. IMPRESSION: Successful placement of a right IJ approach Power Port with ultrasound and fluoroscopic guidance. The catheter is ready for use. Electronically Signed   By: Jacqulynn Cadet M.D.   On: 09/27/2018 13:13    All questions were answered. The patient knows to call the clinic with any problems, questions or concerns. No barriers to learning was detected.  I spent 15 minutes counseling the patient face to face. The total time spent in the appointment was 20 minutes and more than 50% was on counseling and review of test results  Heath Lark, MD 10/07/2018 1:09 PM

## 2018-10-07 NOTE — Assessment & Plan Note (Signed)
This is stable 

## 2018-10-08 ENCOUNTER — Inpatient Hospital Stay: Payer: BLUE CROSS/BLUE SHIELD

## 2018-10-08 ENCOUNTER — Ambulatory Visit: Payer: BLUE CROSS/BLUE SHIELD

## 2018-10-08 VITALS — BP 128/80 | HR 87 | Temp 98.4°F | Resp 17

## 2018-10-08 DIAGNOSIS — R110 Nausea: Secondary | ICD-10-CM | POA: Insufficient documentation

## 2018-10-08 DIAGNOSIS — C539 Malignant neoplasm of cervix uteri, unspecified: Secondary | ICD-10-CM | POA: Diagnosis present

## 2018-10-08 DIAGNOSIS — Z5111 Encounter for antineoplastic chemotherapy: Secondary | ICD-10-CM | POA: Insufficient documentation

## 2018-10-08 DIAGNOSIS — C73 Malignant neoplasm of thyroid gland: Secondary | ICD-10-CM | POA: Insufficient documentation

## 2018-10-08 DIAGNOSIS — D61818 Other pancytopenia: Secondary | ICD-10-CM | POA: Insufficient documentation

## 2018-10-08 DIAGNOSIS — G620 Drug-induced polyneuropathy: Secondary | ICD-10-CM | POA: Diagnosis not present

## 2018-10-08 MED ORDER — SODIUM CHLORIDE 0.9 % IV SOLN
Freq: Once | INTRAVENOUS | Status: AC
  Administered 2018-10-08: 11:00:00 via INTRAVENOUS
  Filled 2018-10-08: qty 5

## 2018-10-08 MED ORDER — SODIUM CHLORIDE 0.9% FLUSH
10.0000 mL | INTRAVENOUS | Status: DC | PRN
  Administered 2018-10-08: 10 mL
  Filled 2018-10-08: qty 10

## 2018-10-08 MED ORDER — PALONOSETRON HCL INJECTION 0.25 MG/5ML
0.2500 mg | Freq: Once | INTRAVENOUS | Status: AC
  Administered 2018-10-08: 0.25 mg via INTRAVENOUS

## 2018-10-08 MED ORDER — SODIUM CHLORIDE 0.9 % IV SOLN
40.0000 mg/m2 | Freq: Once | INTRAVENOUS | Status: AC
  Administered 2018-10-08: 66 mg via INTRAVENOUS
  Filled 2018-10-08: qty 66

## 2018-10-08 MED ORDER — SODIUM CHLORIDE 0.9 % IV SOLN
Freq: Once | INTRAVENOUS | Status: AC
  Administered 2018-10-08: 08:00:00 via INTRAVENOUS
  Filled 2018-10-08: qty 250

## 2018-10-08 MED ORDER — POTASSIUM CHLORIDE 2 MEQ/ML IV SOLN
Freq: Once | INTRAVENOUS | Status: AC
  Administered 2018-10-08: 09:00:00 via INTRAVENOUS
  Filled 2018-10-08: qty 10

## 2018-10-08 MED ORDER — HEPARIN SOD (PORK) LOCK FLUSH 100 UNIT/ML IV SOLN
500.0000 [IU] | Freq: Once | INTRAVENOUS | Status: AC | PRN
  Administered 2018-10-08: 500 [IU]
  Filled 2018-10-08: qty 5

## 2018-10-08 MED ORDER — PALONOSETRON HCL INJECTION 0.25 MG/5ML
INTRAVENOUS | Status: AC
  Filled 2018-10-08: qty 5

## 2018-10-11 ENCOUNTER — Ambulatory Visit
Admission: RE | Admit: 2018-10-11 | Discharge: 2018-10-11 | Disposition: A | Discharge status: Home / Self Care | Payer: BLUE CROSS/BLUE SHIELD | Source: Ambulatory Visit | Attending: Radiation Oncology | Admitting: Radiation Oncology

## 2018-10-11 DIAGNOSIS — C539 Malignant neoplasm of cervix uteri, unspecified: Secondary | ICD-10-CM | POA: Diagnosis not present

## 2018-10-11 DIAGNOSIS — Z510 Encounter for antineoplastic radiation therapy: Secondary | ICD-10-CM | POA: Diagnosis not present

## 2018-10-12 ENCOUNTER — Ambulatory Visit
Admission: RE | Admit: 2018-10-12 | Discharge: 2018-10-12 | Disposition: A | Discharge status: Home / Self Care | Payer: BLUE CROSS/BLUE SHIELD | Source: Ambulatory Visit | Attending: Radiation Oncology | Admitting: Radiation Oncology

## 2018-10-12 DIAGNOSIS — Z510 Encounter for antineoplastic radiation therapy: Secondary | ICD-10-CM | POA: Diagnosis not present

## 2018-10-13 ENCOUNTER — Other Ambulatory Visit: Payer: Self-pay

## 2018-10-13 ENCOUNTER — Ambulatory Visit
Admission: RE | Admit: 2018-10-13 | Discharge: 2018-10-13 | Disposition: A | Discharge status: Home / Self Care | Payer: BLUE CROSS/BLUE SHIELD | Source: Ambulatory Visit | Attending: Radiation Oncology | Admitting: Radiation Oncology

## 2018-10-13 ENCOUNTER — Inpatient Hospital Stay: Payer: BLUE CROSS/BLUE SHIELD

## 2018-10-13 DIAGNOSIS — C531 Malignant neoplasm of exocervix: Secondary | ICD-10-CM

## 2018-10-13 DIAGNOSIS — Z510 Encounter for antineoplastic radiation therapy: Secondary | ICD-10-CM | POA: Diagnosis not present

## 2018-10-13 LAB — CBC WITH DIFFERENTIAL/PLATELET
Abs Immature Granulocytes: 0.01 10*3/uL (ref 0.00–0.07)
Basophils Absolute: 0 10*3/uL (ref 0.0–0.1)
Basophils Relative: 1 %
EOS PCT: 6 %
Eosinophils Absolute: 0.2 10*3/uL (ref 0.0–0.5)
HCT: 35.3 % — ABNORMAL LOW (ref 36.0–46.0)
Hemoglobin: 11.6 g/dL — ABNORMAL LOW (ref 12.0–15.0)
Immature Granulocytes: 0 %
Lymphocytes Relative: 17 %
Lymphs Abs: 0.6 10*3/uL — ABNORMAL LOW (ref 0.7–4.0)
MCH: 28.6 pg (ref 26.0–34.0)
MCHC: 32.9 g/dL (ref 30.0–36.0)
MCV: 86.9 fL (ref 80.0–100.0)
Monocytes Absolute: 0.3 10*3/uL (ref 0.1–1.0)
Monocytes Relative: 8 %
Neutro Abs: 2.4 10*3/uL (ref 1.7–7.7)
Neutrophils Relative %: 68 %
Platelets: 192 10*3/uL (ref 150–400)
RBC: 4.06 MIL/uL (ref 3.87–5.11)
RDW: 15 % (ref 11.5–15.5)
WBC: 3.6 10*3/uL — ABNORMAL LOW (ref 4.0–10.5)
nRBC: 0 % (ref 0.0–0.2)

## 2018-10-13 LAB — COMPREHENSIVE METABOLIC PANEL
ALT: 15 U/L (ref 0–44)
AST: 17 U/L (ref 15–41)
Albumin: 4 g/dL (ref 3.5–5.0)
Alkaline Phosphatase: 94 U/L (ref 38–126)
Anion gap: 9 (ref 5–15)
BUN: 14 mg/dL (ref 6–20)
CO2: 28 mmol/L (ref 22–32)
Calcium: 9.1 mg/dL (ref 8.9–10.3)
Chloride: 100 mmol/L (ref 98–111)
Creatinine, Ser: 0.73 mg/dL (ref 0.44–1.00)
GFR calc Af Amer: >60 mL/min (ref >60)
GFR calc non Af Amer: >60 mL/min (ref >60)
Glucose, Bld: 98 mg/dL (ref 70–99)
POTASSIUM: 4.4 mmol/L (ref 3.5–5.1)
Sodium: 137 mmol/L (ref 135–145)
Total Bilirubin: 0.5 mg/dL (ref 0.3–1.2)
Total Protein: 7.6 g/dL (ref 6.5–8.1)

## 2018-10-13 LAB — MAGNESIUM: Magnesium: 1.9 mg/dL (ref 1.7–2.4)

## 2018-10-14 ENCOUNTER — Inpatient Hospital Stay: Payer: BLUE CROSS/BLUE SHIELD | Admitting: Hematology and Oncology

## 2018-10-14 ENCOUNTER — Encounter: Payer: Self-pay | Admitting: Hematology and Oncology

## 2018-10-14 ENCOUNTER — Ambulatory Visit
Admission: RE | Admit: 2018-10-14 | Discharge: 2018-10-14 | Disposition: A | Discharge status: Home / Self Care | Payer: BLUE CROSS/BLUE SHIELD | Source: Ambulatory Visit | Attending: Radiation Oncology | Admitting: Radiation Oncology

## 2018-10-14 DIAGNOSIS — C539 Malignant neoplasm of cervix uteri, unspecified: Secondary | ICD-10-CM

## 2018-10-14 DIAGNOSIS — R110 Nausea: Secondary | ICD-10-CM

## 2018-10-14 DIAGNOSIS — C73 Malignant neoplasm of thyroid gland: Secondary | ICD-10-CM | POA: Diagnosis not present

## 2018-10-14 DIAGNOSIS — Z510 Encounter for antineoplastic radiation therapy: Secondary | ICD-10-CM | POA: Diagnosis not present

## 2018-10-14 DIAGNOSIS — G620 Drug-induced polyneuropathy: Secondary | ICD-10-CM | POA: Diagnosis not present

## 2018-10-14 DIAGNOSIS — D61818 Other pancytopenia: Secondary | ICD-10-CM | POA: Diagnosis not present

## 2018-10-14 DIAGNOSIS — T451X5A Adverse effect of antineoplastic and immunosuppressive drugs, initial encounter: Secondary | ICD-10-CM

## 2018-10-14 NOTE — Progress Notes (Signed)
[place] OFFICE PROGRESS NOTE  Patient Care Team: Antony Blackbird, MD as PCP - General (Family Medicine) [name], RN as Oncology Nurse Navigator (Oncology)  ASSESSMENT & PLAN:  Recurrent cervical cancer [facility]) She tolerated treatment well except for mild weight loss, tingling sensation and mild pancytopenia I will continue to see her on a weekly basis for further supportive care We will proceed with treatment tomorrow without dose adjustment  Peripheral neuropathy due to chemotherapy [facility] she has mild peripheral neuropathy, likely related to side effects of treatment. It is only mild, not bothering the patient. I will observe for now If it gets worse in the future, I will consider modifying the dose of the treatment   Pancytopenia, acquired ([place]) She has mild pancytopenia due to recent chemotherapy She is not symptomatic Observe only for now If her CBC is worse next week, I will consider dose adjustment  Chemotherapy-induced nausea She has mild chemotherapy induced nausea I recommend antiemetics as needed   No orders of the defined types were placed in this encounter.   INTERVAL HISTORY: Please see below for problem oriented charting. She returns with interpreter for further follow-up She feels well except for fatigue, mild nausea and persistent intermittent neuropathy No recent infection, fever or chills No recent changes in bowel habits  SUMMARY OF ONCOLOGIC HISTORY: Oncology History   Recurrent cervical cancer to the vagina HPV positive from 2019 specimen     Recurrent cervical cancer ([place])   06/11/2011 Pathology Results    1. Endocervix, curettage DETACHED FRAGMENTS OF SQUAMOUS MUCOSA WITH KOILOCYTIC ATYPIA, SEE COMMENT. 2. Cervix, biopsy LOW GRADE SQUAMOUS INTRAEPITHELIAL LESION, CIN-I (MILD DYSPLASIA), SEE COMMENT. Microscopic Comment 1. 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 2. The findings    01/09/2012  Pathology Results    LOW GRADE SQUAMOUS INTRAEPITHELIAL LESION: CIN-1/ HPV (LSIL). ENDOMETRIAL CELLS ARE PRESENT.    01/12/2018 Pathology Results    Cervix, biopsy - INVASIVE SQUAMOUS CELL CARCINOMA - SEE COMMENT    02/08/2018 Pathology Results    1. Lymph nodes, regional resection, right pelvic - EIGHT LYMPH NODES, NEGATIVE FOR CARCINOMA (0/8). 2. Lymph nodes, regional resection, left pelvic - TWELVE LYMPH NODES, NEGATIVE FOR CARCINOMA (0/12). 3. Uterus +/- tubes/ovaries, neoplastic, cervix, bilateral tubes and ovaries, upper third of vagina - INVASIVE MODERATELY DIFFERENTIATED SQUAMOUS CELL CARCINOMA, 2.6 CM, INVOLVING ANTERIOR PORTION OF CERVIX FROM 9 O'CLOCK TO 3 O'CLOCK. - TUMOR INVADES FOR DEPTH OF 0.5 CM AND IS CONFINED TO THE CERVIX. - ALL RESECTION MARGINS ARE NEGATIVE FOR CARCINOMA; THE CLOSEST IS THE VAGINAL CUFF MARGIN AT 1 CM. - NEGATIVE FOR LYMPHOVASCULAR OR PERINEURAL INVASION. - BENIGN ENDOMETRIAL POLYP, 2.6 CM. - BENIGN LEIOMYOMA, 2.2 CM - BENIGN INACTIVE ENDOMETRIUM. - BENIGN BILATERAL OVARIES AND FALLOPIAN TUBES. - SEE ONCOLOGY TABLE. Microscopic Comment 3. UTERINE CERVIX: Resection Procedure: Radial hysterectomy. Tumor Size: 2.6 cm. Histologic Type: Squamous cell carcinoma. Histologic Grade: G2: Moderately differentiated. Stromal Invasion: Depth of stromal invasion (millimeters): 5 mm. Horizontal extent longitudinal/length (if applicable#) (millimeters): 20 mm. Horizontal extent circumferential/width (if applicable#) (millimeters): 26 mm. Other Tissue/ Organ: Not involved. Margins: Negative for carcinoma. Lymphovascular Invasion: Not identified. Regional Lymph Nodes: All lymph nodes negative for tumor cells Total Number of Lymph Nodes Examined: 20. Number of Sentinel Nodes Examined (if applicable): 0. Pathologic Stage Classification (pTNM, AJCC 8th Edition): pT1b1, pN0. Ancillary Studies: Not applicable. Representative Tumor Block: 3E, [id number], and 3G.     02/08/2018 Surgery    Operation: Robotic-assisted type III  radical laparoscopic hysterectomy with bilateral salpingoophorectomy and bilateral pelvic lymphadenectomy  Surgeon: Donaciano Eva  Operative Findings:  : 2-3cm pedunculated exophytic mass from right anterior cervix. No clinical involvement of the parametria, no suspicious nodes    02/16/2018 Surgery    Procedure: 1. Cystoscopy, bilateral retrograde pyelograms with interpretation 2. Diagnostic ureteroscopy 3. Bilateral ureteral stent placement  Surgeon: Ardis Hughs, MD  Intraoperative findings:  #1: The retrograde pyelogram on the patient's right side initially demonstrated significant contrast extravasation several centimeters from the ureteral orifice.  There was proximal hydroureteronephrosis. #2: Ureteroscopy of the right ureter demonstrated a full-thickness ureteral injury likely thermal in nature approximately 2-1/2 cm from the UVJ. #3: The retrograde on the patient's left side demonstrated severe extravasation with no contrast getting beyond the first centimeter of the ureter.  Once beyond this area with a ureteroscope the ureter was mild to moderately dilated. 4.:  Ureteroscopy of the left ureter demonstrated an area approximately 2 cm from the UVJ and lasting approximately a centimeter and a half of devitalized tissue, full-thickness, likely thermal in nature. #5: 5 French x22 cm Polaris stents were placed bilaterally.     03/22/2018 Imaging    Highly suspicious nodule in the inferior right thyroid lobe corresponds with the hypermetabolic activity on the PET-CT. Recommend ultrasound-guided biopsy of this right thyroid nodule.  No other thyroid nodules.    04/22/2018 Surgery    Procedure: 4. Cystoscopy 5. Bilateral retrograde pyelogram with interpretation 6. Bilateral ureteral stent exchange 7. Left ureteroscopy, diagnostic  Surgeon: Ardis Hughs, MD  Intraoperative findings:  #1: On  speculum exam there was a tine from the right ureteral stent (Polaris) noted at the vaginal cuff.  The remainder of the vaginal cuff was healed, including the area around the ureteral stent tine. 2.:  The right retrograde pyelogram was performed using 10 cc of Omni, and demonstrated normal caliber ureter with no significant hydroureteronephrosis.  There did not appear to be any communication or extravasation of contrast in the distal ureter or UVJ with the vagina. 3.:  The left retrograde pyelogram demonstrated a small area of extravasation at the UVJ, nearly the intramural ureter, that seemed to communicate with the patient's vagina.  There were no other significant filling defects, there is no hydroureteronephrosis. 4.:  Left diagnostic ureteroscopy demonstrated an abnormality within the intramural or very distal UVJ region where there was some bullous edema and likely the site of the fistula.  There was no identifiable communication.  The remaining aspect of the ureter was normal.    05/28/2018 Surgery    Procedure: 8. Bilateral retrograde pyelogram with interpretation 9. Bilateral diagnostic ureteroscopy 10. Bilateral ureteral stent exchange 11. Bilateral nephrostomy tube removal  Surgeon: Ardis Hughs, MD  Intraoperative findings:  #1: 5 French open-ended ureteral catheter was used to perform a retrograde pyelogram in the patient's left ureter which demonstrated normal caliber ureter with no hydro-.  However, there was a narrowed segment at the intramural ureter and just at the UVJ with a prominent fistula draining into the vagina. #2: I removed the wire to see the how well the ureter with drain, it did not drain well at all.  As such I tried to repassed the wire and ultimately had to pass a ureteroscope through the distal ureter in order to get the wire up into the left renal pelvis.  This noted distorted and abnormal mucosa in the intravesical and ureterovesical junction. #3: The  patient's right sided ureteral stent distal had  migrated into the patient's vagina.  In order to remove the stent I had to perform ureteroscopy on the right ureter cannulating the right ureteral orifice and advancing it through the intramural ureter and beyond the distal ureter.  Once I was up beyond the fistula I passed the wire through the scope and into the right renal pelvis.  Once the wire was in the pelvis I removed the scope and pulled the stent through the patient's vagina. 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 #5: A 24 cm x 6 French double-J ureteral stent was placed in the right ureter. #6: A 22 cm x 5 French Polaris catheter was placed in the patient's left ureter    07/02/2018 Surgery    Procedure: 1. Robotic assisted laparoscopic bilateral ureteral reimplant  Surgeon: Ardis Hughs, MD First Assistant: Dr. [name], MD  Intraoperative findings:  #1: The patient's ureters were thickened, and adherent to the sidewalls, but there were able to be reimplanted without any tension. #2: The patient had a mid urethral sling mesh noted around the suprapubic bone that was unmolested.    08/27/2018 Pathology Results    Vagina, biopsy, left upper vaginal side - wall - SQUAMOUS CELL CARCINOMA.    09/15/2018 Cancer Staging    Staging form: Cervix Uteri, AJCC 7th Edition - Pathologic: FIGO Stage IB1 (T1b1, N0, cM0) - Signed by Heath Lark, MD on 09/16/2018    09/27/2018 Procedure    Successful placement of a right IJ approach Power Port with ultrasound and fluoroscopic guidance. The catheter is ready for use.    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     REVIEW OF SYSTEMS:   Constitutional: Denies fevers, chills or abnormal weight loss Eyes: Denies blurriness of vision Ears, nose, mouth, throat, and face: Denies mucositis or sore throat Respiratory: Denies cough, dyspnea or wheezes Cardiovascular: Denies palpitation, chest discomfort or lower extremity swelling Skin: Denies abnormal skin rashes Lymphatics: Denies new lymphadenopathy or easy bruising Neurological:Denies numbness, tingling or new weaknesses Behavioral/Psych: Mood is stable, no new changes  All other systems were reviewed with the patient and are negative.  I have reviewed the past medical history, past surgical history, social history and family history with the patient and they are unchanged from previous note.  ALLERGIES:  has No Known Allergies.  MEDICATIONS:  Current Outpatient Medications  Medication Sig Dispense Refill  . acetaminophen (TYLENOL) 500 MG tablet Take 2 tablets (1,000 mg total) by mouth every 6 (six) hours as needed for moderate pain, fever or headache. 30 tablet 0  . lidocaine-prilocaine (EMLA) cream Apply to affected area once 30 g 3  . ondansetron (ZOFRAN) 8 MG tablet Take 1 tablet (8 mg total) by mouth every 8 (eight) hours as needed. Start on the third day after chemotherapy. 30 tablet 1  . prochlorperazine (COMPAZINE) 10 MG tablet Take 1 tablet (10 mg total) by mouth every 6 (six) hours as needed (Nausea or vomiting). 30 tablet 1   No current facility-administered medications for this visit.  PHYSICAL EXAMINATION: ECOG PERFORMANCE STATUS: 1 - Symptomatic but completely ambulatory  Vitals:   10/14/18 1231  BP: 111/65  Pulse: 90  Resp: 18  Temp: 98.4 F (36.9 C)  SpO2: 100%   Filed Weights   10/14/18 1231  Weight: 145 lb 9.6 oz (66 kg)    GENERAL:alert, no distress and  comfortable SKIN: skin color, texture, turgor are normal, no rashes or significant lesions EYES: normal, Conjunctiva are pink and non-injected, sclera clear OROPHARYNX:no exudate, no erythema and lips, buccal mucosa, and tongue normal  NECK: supple, thyroid normal size, non-tender, without nodularity LYMPH:  no palpable lymphadenopathy in the cervical, axillary or inguinal LUNGS: clear to auscultation and percussion with normal breathing effort HEART: regular rate & rhythm and no murmurs and no lower extremity edema ABDOMEN:abdomen soft, non-tender and normal bowel sounds Musculoskeletal:no cyanosis of digits and no clubbing  NEURO: alert & oriented x 3 with fluent speech, no focal motor/sensory deficits  LABORATORY DATA:  I have reviewed the data as listed    Component Value Date/Time   NA 137 10/13/2018 1114   NA 140 05/04/2018 1657   K 4.4 10/13/2018 1114   CL 100 10/13/2018 1114   CO2 28 10/13/2018 1114   GLUCOSE 98 10/13/2018 1114   BUN 14 10/13/2018 1114   BUN 11 05/04/2018 1657   CREATININE 0.73 10/13/2018 1114   CALCIUM 9.1 10/13/2018 1114   PROT 7.6 10/13/2018 1114   PROT 7.3 05/04/2018 1657   ALBUMIN 4.0 10/13/2018 1114   ALBUMIN 4.3 05/04/2018 1657   AST 17 10/13/2018 1114   ALT 15 10/13/2018 1114   ALKPHOS 94 10/13/2018 1114   BILITOT 0.5 10/13/2018 1114   BILITOT <0.2 05/04/2018 1657   GFRNONAA >60 10/13/2018 1114   GFRAA >60 10/13/2018 1114    No results found for: SPEP, UPEP  Lab Results  Component Value Date   WBC 3.6 (L) 10/13/2018   NEUTROABS 2.4 10/13/2018   HGB 11.6 (L) 10/13/2018   HCT 35.3 (L) 10/13/2018   MCV 86.9 10/13/2018   PLT 192 10/13/2018      Chemistry      Component Value Date/Time   NA 137 10/13/2018 1114   NA 140 05/04/2018 1657   K 4.4 10/13/2018 1114   CL 100 10/13/2018 1114   CO2 28 10/13/2018 1114   BUN 14 10/13/2018 1114   BUN 11 05/04/2018 1657   CREATININE 0.73 10/13/2018 1114      Component Value Date/Time    CALCIUM 9.1 10/13/2018 1114   ALKPHOS 94 10/13/2018 1114   AST 17 10/13/2018 1114   ALT 15 10/13/2018 1114   BILITOT 0.5 10/13/2018 1114   BILITOT <0.2 05/04/2018 1657       RADIOGRAPHIC STUDIES: I have personally reviewed the radiological images as listed and agreed with the findings in the report. [place] Fna Bx Thyroid 1st Lesion Afirma  Result Date: 09/23/2018 INDICATION: Indeterminate thyroid nodule EXAM: ULTRASOUND GUIDED FINE NEEDLE ASPIRATION OF INDETERMINATE THYROID NODULE COMPARISON:  Thyroid ultrasound on 03/25/2018 MEDICATIONS: None COMPLICATIONS: None immediate. TECHNIQUE: Informed written consent was obtained from the patient after a discussion of the risks, benefits and alternatives to treatment. Questions regarding the procedure were encouraged and answered. A timeout was performed prior to the initiation of the procedure. Pre-procedural ultrasound scanning demonstrated unchanged size and appearance of the indeterminate nodule within the inferior right lobe of the thyroid gland. The procedure was planned. The neck was prepped in the usual sterile fashion, and a sterile drape  was applied covering the operative field. A timeout was performed prior to the initiation of the procedure. Local anesthesia was provided with 1% lidocaine. Under direct ultrasound guidance, 5 FNA biopsies were performed with 25 gauge needles. Multiple ultrasound images were saved for procedural documentation purposes. The samples were prepared and submitted to pathology. Limited post procedural scanning was negative for hematoma or additional complication. Dressings were placed. The patient tolerated the above procedures procedure well without immediate postprocedural complication. FINDINGS: Nodule reference number based on prior diagnostic ultrasound: 1 Maximum size: 1.2 cm Location: Right; Inferior ACR TI-RADS risk category: TR5 (>/= 7 points) Reason for biopsy: meets ACR TI-RADS criteria Ultrasound imaging confirms  appropriate placement of the needles within the thyroid nodule. IMPRESSION: Technically successful ultrasound guided fine needle aspiration of 1.2 cm inferior right thyroid nodule. Electronically Signed   By: Aletta Edouard M.D.   On: 09/23/2018 15:51   Ir Imaging Guided Port Insertion  Result Date: 09/27/2018 INDICATION: 58 year old female with endo cervical cancer in need of durable venous access for chemotherapy. EXAM: IMPLANTED PORT A CATH PLACEMENT WITH ULTRASOUND AND FLUOROSCOPIC GUIDANCE MEDICATIONS: 2 g Ancef; The antibiotic was administered within an appropriate time interval prior to skin puncture. ANESTHESIA/SEDATION: Versed 2 mg IV; Fentanyl 100 mcg IV; Moderate Sedation Time:  21 minutes The patient was continuously monitored during the procedure by the interventional radiology nurse under my direct supervision. FLUOROSCOPY TIME:  0 minutes, 12 seconds (1 mGy) COMPLICATIONS: None immediate. PROCEDURE: The right neck and chest was prepped with chlorhexidine, and draped in the usual sterile fashion using maximum barrier technique (cap and mask, sterile gown, sterile gloves, large sterile sheet, hand hygiene and cutaneous antiseptic). Local anesthesia was attained by infiltration with 1% lidocaine with epinephrine. Ultrasound demonstrated patency of the right internal jugular vein, and this was documented with an image. Under real-time ultrasound guidance, this vein was accessed with a 21 gauge micropuncture needle and image documentation was performed. A small dermatotomy was made at the access site with an 11 scalpel. A 0.018" wire was advanced into the SVC and the access needle exchanged for a 23F micropuncture vascular sheath. The 0.018" wire was then removed and a 0.035" wire advanced into the IVC. An appropriate location for the subcutaneous reservoir was selected below the clavicle and an incision was made through the skin and underlying soft tissues. The subcutaneous tissues were then dissected  using a combination of blunt and sharp surgical technique and a pocket was formed. A single lumen power injectable portacatheter was then tunneled through the subcutaneous tissues from the pocket to the dermatotomy and the port reservoir placed within the subcutaneous pocket. The venous access site was then serially dilated and a peel away vascular sheath placed over the wire. The wire was removed and the port catheter advanced into position under fluoroscopic guidance. The catheter tip is positioned in the superior cavoatrial junction. This was documented with a spot image. The portacatheter was then tested and found to flush and aspirate well. The port was flushed with saline followed by 100 units/mL heparinized saline. The pocket was then closed in two layers using first subdermal inverted interrupted absorbable sutures followed by a running subcuticular suture. The epidermis was then sealed with Dermabond. The dermatotomy at the venous access site was also closed with Dermabond. IMPRESSION: Successful placement of a right IJ approach Power Port with ultrasound and fluoroscopic guidance. The catheter is ready for use. Electronically Signed   By: Jacqulynn Cadet M.D.   On: 09/27/2018  13:13    All questions were answered. The patient knows to call the clinic with any problems, questions or concerns. No barriers to learning was detected.  I spent 15 minutes counseling the patient face to face. The total time spent in the appointment was 20 minutes and more than 50% was on counseling and review of test results  Heath Lark, MD 10/14/2018 2:30 PM

## 2018-10-14 NOTE — Assessment & Plan Note (Signed)
she has mild peripheral neuropathy, likely related to side effects of treatment. It is only mild, not bothering the patient. I will observe for now If it gets worse in the future, I will consider modifying the dose of the treatment  

## 2018-10-14 NOTE — Assessment & Plan Note (Signed)
She has mild pancytopenia due to recent chemotherapy She is not symptomatic Observe only for now If her CBC is worse next week, I will consider dose adjustment

## 2018-10-14 NOTE — Assessment & Plan Note (Signed)
She has mild chemotherapy induced nausea I recommend antiemetics as needed

## 2018-10-14 NOTE — Assessment & Plan Note (Signed)
She tolerated treatment well except for mild weight loss, tingling sensation and mild pancytopenia I will continue to see her on a weekly basis for further supportive care We will proceed with treatment tomorrow without dose adjustment

## 2018-10-15 ENCOUNTER — Ambulatory Visit
Admission: RE | Admit: 2018-10-15 | Discharge: 2018-10-15 | Disposition: A | Discharge status: Home / Self Care | Payer: BLUE CROSS/BLUE SHIELD | Source: Ambulatory Visit | Attending: Radiation Oncology | Admitting: Radiation Oncology

## 2018-10-15 ENCOUNTER — Other Ambulatory Visit: Payer: Self-pay

## 2018-10-15 ENCOUNTER — Inpatient Hospital Stay: Payer: BLUE CROSS/BLUE SHIELD

## 2018-10-15 VITALS — BP 116/78 | HR 86 | Temp 98.5°F | Resp 16 | Wt 146.0 lb

## 2018-10-15 DIAGNOSIS — Z510 Encounter for antineoplastic radiation therapy: Secondary | ICD-10-CM | POA: Diagnosis not present

## 2018-10-15 DIAGNOSIS — C539 Malignant neoplasm of cervix uteri, unspecified: Secondary | ICD-10-CM

## 2018-10-15 MED ORDER — SODIUM CHLORIDE 0.9 % IV SOLN
40.0000 mg/m2 | Freq: Once | INTRAVENOUS | Status: AC
  Administered 2018-10-15: 66 mg via INTRAVENOUS
  Filled 2018-10-15: qty 66

## 2018-10-15 MED ORDER — PALONOSETRON HCL INJECTION 0.25 MG/5ML
INTRAVENOUS | Status: AC
  Filled 2018-10-15: qty 5

## 2018-10-15 MED ORDER — PALONOSETRON HCL INJECTION 0.25 MG/5ML
0.2500 mg | Freq: Once | INTRAVENOUS | Status: AC
  Administered 2018-10-15: 0.25 mg via INTRAVENOUS

## 2018-10-15 MED ORDER — SODIUM CHLORIDE 0.9 % IV SOLN
Freq: Once | INTRAVENOUS | Status: AC
  Administered 2018-10-15: 09:00:00 via INTRAVENOUS
  Filled 2018-10-15: qty 250

## 2018-10-15 MED ORDER — POTASSIUM CHLORIDE 2 MEQ/ML IV SOLN
Freq: Once | INTRAVENOUS | Status: AC
  Administered 2018-10-15: 09:00:00 via INTRAVENOUS
  Filled 2018-10-15: qty 10

## 2018-10-15 MED ORDER — HEPARIN SOD (PORK) LOCK FLUSH 100 UNIT/ML IV SOLN
500.0000 [IU] | Freq: Once | INTRAVENOUS | Status: AC | PRN
  Administered 2018-10-15: 500 [IU]
  Filled 2018-10-15: qty 5

## 2018-10-15 MED ORDER — SODIUM CHLORIDE 0.9 % IV SOLN
Freq: Once | INTRAVENOUS | Status: AC
  Administered 2018-10-15: 11:00:00 via INTRAVENOUS
  Filled 2018-10-15: qty 5

## 2018-10-15 MED ORDER — SODIUM CHLORIDE 0.9% FLUSH
10.0000 mL | INTRAVENOUS | Status: DC | PRN
  Administered 2018-10-15: 10 mL
  Filled 2018-10-15: qty 10

## 2018-10-15 NOTE — Patient Instructions (Signed)
Cancer Center Discharge Instructions for Patients Receiving Chemotherapy  Today you received the following chemotherapy agents Cisplatin  To help prevent nausea and vomiting after your treatment, we encourage you to take your nausea medication as directed  If you develop nausea and vomiting that is not controlled by your nausea medication, call the clinic.   BELOW ARE SYMPTOMS THAT SHOULD BE REPORTED IMMEDIATELY:  *FEVER GREATER THAN 100.5 F  *CHILLS WITH OR WITHOUT FEVER  NAUSEA AND VOMITING THAT IS NOT CONTROLLED WITH YOUR NAUSEA MEDICATION  *UNUSUAL SHORTNESS OF BREATH  *UNUSUAL BRUISING OR BLEEDING  TENDERNESS IN MOUTH AND THROAT WITH OR WITHOUT PRESENCE OF ULCERS  *URINARY PROBLEMS  *BOWEL PROBLEMS  UNUSUAL RASH Items with * indicate a potential emergency and should be followed up as soon as possible.  Feel free to call the clinic should you have any questions or concerns. The clinic phone number is [phone].  Please show the CHEMO ALERT CARD at check-in to the Emergency Department and triage nurse.   

## 2018-10-18 ENCOUNTER — Other Ambulatory Visit: Payer: Self-pay

## 2018-10-18 ENCOUNTER — Ambulatory Visit
Admission: RE | Admit: 2018-10-18 | Discharge: 2018-10-18 | Disposition: A | Discharge status: Home / Self Care | Payer: BLUE CROSS/BLUE SHIELD | Source: Ambulatory Visit | Attending: Radiation Oncology | Admitting: Radiation Oncology

## 2018-10-18 DIAGNOSIS — Z510 Encounter for antineoplastic radiation therapy: Secondary | ICD-10-CM | POA: Diagnosis not present

## 2018-10-19 ENCOUNTER — Ambulatory Visit
Admission: RE | Admit: 2018-10-19 | Discharge: 2018-10-19 | Disposition: A | Discharge status: Home / Self Care | Payer: BLUE CROSS/BLUE SHIELD | Source: Ambulatory Visit | Attending: Radiation Oncology | Admitting: Radiation Oncology

## 2018-10-19 ENCOUNTER — Other Ambulatory Visit: Payer: Self-pay

## 2018-10-19 DIAGNOSIS — Z510 Encounter for antineoplastic radiation therapy: Secondary | ICD-10-CM | POA: Diagnosis not present

## 2018-10-20 ENCOUNTER — Other Ambulatory Visit: Payer: Self-pay

## 2018-10-20 ENCOUNTER — Ambulatory Visit
Admission: RE | Admit: 2018-10-20 | Discharge: 2018-10-20 | Disposition: A | Discharge status: Home / Self Care | Payer: BLUE CROSS/BLUE SHIELD | Source: Ambulatory Visit | Attending: Radiation Oncology | Admitting: Radiation Oncology

## 2018-10-20 ENCOUNTER — Inpatient Hospital Stay: Payer: BLUE CROSS/BLUE SHIELD

## 2018-10-20 DIAGNOSIS — Z510 Encounter for antineoplastic radiation therapy: Secondary | ICD-10-CM | POA: Diagnosis not present

## 2018-10-20 DIAGNOSIS — C531 Malignant neoplasm of exocervix: Secondary | ICD-10-CM

## 2018-10-20 LAB — CBC WITH DIFFERENTIAL/PLATELET
Abs Immature Granulocytes: 0.01 10*3/uL (ref 0.00–0.07)
BASOS ABS: 0 10*3/uL (ref 0.0–0.1)
Basophils Relative: 1 %
Eosinophils Absolute: 0.1 10*3/uL (ref 0.0–0.5)
Eosinophils Relative: 3 %
HCT: 33.6 % — ABNORMAL LOW (ref 36.0–46.0)
Hemoglobin: 10.9 g/dL — ABNORMAL LOW (ref 12.0–15.0)
Immature Granulocytes: 0 %
Lymphocytes Relative: 16 %
Lymphs Abs: 0.4 10*3/uL — ABNORMAL LOW (ref 0.7–4.0)
MCH: 28.1 pg (ref 26.0–34.0)
MCHC: 32.4 g/dL (ref 30.0–36.0)
MCV: 86.6 fL (ref 80.0–100.0)
Monocytes Absolute: 0.3 10*3/uL (ref 0.1–1.0)
Monocytes Relative: 10 %
NRBC: 0 % (ref 0.0–0.2)
Neutro Abs: 1.8 10*3/uL (ref 1.7–7.7)
Neutrophils Relative %: 70 %
Platelets: 212 10*3/uL (ref 150–400)
RBC: 3.88 MIL/uL (ref 3.87–5.11)
RDW: 15.4 % (ref 11.5–15.5)
WBC: 2.5 10*3/uL — ABNORMAL LOW (ref 4.0–10.5)

## 2018-10-20 LAB — COMPREHENSIVE METABOLIC PANEL
ALK PHOS: 91 U/L (ref 38–126)
ALT: 16 U/L (ref 0–44)
AST: 15 U/L (ref 15–41)
Albumin: 4 g/dL (ref 3.5–5.0)
Anion gap: 9 (ref 5–15)
BUN: 11 mg/dL (ref 6–20)
CO2: 29 mmol/L (ref 22–32)
CREATININE: 0.73 mg/dL (ref 0.44–1.00)
Calcium: 9.2 mg/dL (ref 8.9–10.3)
Chloride: 99 mmol/L (ref 98–111)
GFR calc Af Amer: >60 mL/min (ref >60)
GFR calc non Af Amer: >60 mL/min (ref >60)
Glucose, Bld: 96 mg/dL (ref 70–99)
Potassium: 4.6 mmol/L (ref 3.5–5.1)
Sodium: 137 mmol/L (ref 135–145)
Total Bilirubin: 0.4 mg/dL (ref 0.3–1.2)
Total Protein: 7.6 g/dL (ref 6.5–8.1)

## 2018-10-20 LAB — MAGNESIUM: Magnesium: 2 mg/dL (ref 1.7–2.4)

## 2018-10-21 ENCOUNTER — Ambulatory Visit
Admission: RE | Admit: 2018-10-21 | Discharge: 2018-10-21 | Disposition: A | Discharge status: Home / Self Care | Payer: BLUE CROSS/BLUE SHIELD | Source: Ambulatory Visit | Attending: Radiation Oncology | Admitting: Radiation Oncology

## 2018-10-21 ENCOUNTER — Other Ambulatory Visit: Payer: Self-pay

## 2018-10-21 ENCOUNTER — Inpatient Hospital Stay (HOSPITAL_BASED_OUTPATIENT_CLINIC_OR_DEPARTMENT_OTHER): Payer: BLUE CROSS/BLUE SHIELD | Admitting: Hematology and Oncology

## 2018-10-21 DIAGNOSIS — R110 Nausea: Secondary | ICD-10-CM | POA: Diagnosis not present

## 2018-10-21 DIAGNOSIS — C73 Malignant neoplasm of thyroid gland: Secondary | ICD-10-CM

## 2018-10-21 DIAGNOSIS — G620 Drug-induced polyneuropathy: Secondary | ICD-10-CM

## 2018-10-21 DIAGNOSIS — D61818 Other pancytopenia: Secondary | ICD-10-CM | POA: Diagnosis not present

## 2018-10-21 DIAGNOSIS — Z510 Encounter for antineoplastic radiation therapy: Secondary | ICD-10-CM | POA: Diagnosis not present

## 2018-10-21 DIAGNOSIS — T451X5A Adverse effect of antineoplastic and immunosuppressive drugs, initial encounter: Secondary | ICD-10-CM

## 2018-10-21 DIAGNOSIS — C539 Malignant neoplasm of cervix uteri, unspecified: Secondary | ICD-10-CM

## 2018-10-21 MED ORDER — PROCHLORPERAZINE MALEATE 10 MG PO TABS: 10.0000 mg | ORAL_TABLET | Freq: Four times a day (QID) | ORAL | 1 refills | Status: DC | PRN

## 2018-10-21 MED ORDER — ONDANSETRON HCL 8 MG PO TABS: 8.0000 mg | ORAL_TABLET | Freq: Three times a day (TID) | ORAL | 1 refills | Status: DC | PRN

## 2018-10-21 MED FILL — ONDANSETRON HCL 8 MG TABLET: 8 | 10 days supply | Qty: 30 | Fill #0

## 2018-10-21 MED FILL — PROCHLORPERAZINE 10 MG TAB: 10 | 7 days supply | Qty: 30 | Fill #0

## 2018-10-22 ENCOUNTER — Encounter: Payer: Self-pay | Admitting: Hematology and Oncology

## 2018-10-22 ENCOUNTER — Ambulatory Visit
Admission: RE | Admit: 2018-10-22 | Discharge: 2018-10-22 | Disposition: A | Discharge status: Home / Self Care | Payer: BLUE CROSS/BLUE SHIELD | Source: Ambulatory Visit | Attending: Radiation Oncology | Admitting: Radiation Oncology

## 2018-10-22 ENCOUNTER — Inpatient Hospital Stay: Payer: BLUE CROSS/BLUE SHIELD

## 2018-10-22 ENCOUNTER — Other Ambulatory Visit: Payer: Self-pay

## 2018-10-22 VITALS — BP 121/80 | HR 89 | Temp 98.9°F | Resp 18

## 2018-10-22 DIAGNOSIS — Z510 Encounter for antineoplastic radiation therapy: Secondary | ICD-10-CM | POA: Diagnosis not present

## 2018-10-22 DIAGNOSIS — C539 Malignant neoplasm of cervix uteri, unspecified: Secondary | ICD-10-CM

## 2018-10-22 MED ORDER — PALONOSETRON HCL INJECTION 0.25 MG/5ML
INTRAVENOUS | Status: AC
  Filled 2018-10-22: qty 5

## 2018-10-22 MED ORDER — SODIUM CHLORIDE 0.9 % IV SOLN
40.0000 mg/m2 | Freq: Once | INTRAVENOUS | Status: AC
  Administered 2018-10-22: 66 mg via INTRAVENOUS
  Filled 2018-10-22: qty 66

## 2018-10-22 MED ORDER — POTASSIUM CHLORIDE 2 MEQ/ML IV SOLN
Freq: Once | INTRAVENOUS | Status: AC
  Administered 2018-10-22: 09:00:00 via INTRAVENOUS
  Filled 2018-10-22: qty 10

## 2018-10-22 MED ORDER — PALONOSETRON HCL INJECTION 0.25 MG/5ML
0.2500 mg | Freq: Once | INTRAVENOUS | Status: AC
  Administered 2018-10-22: 0.25 mg via INTRAVENOUS

## 2018-10-22 MED ORDER — SODIUM CHLORIDE 0.9% FLUSH
10.0000 mL | INTRAVENOUS | Status: DC | PRN
  Administered 2018-10-22: 10 mL
  Filled 2018-10-22: qty 10

## 2018-10-22 MED ORDER — HEPARIN SOD (PORK) LOCK FLUSH 100 UNIT/ML IV SOLN
500.0000 [IU] | Freq: Once | INTRAVENOUS | Status: AC | PRN
  Administered 2018-10-22: 500 [IU]
  Filled 2018-10-22: qty 5

## 2018-10-22 MED ORDER — SODIUM CHLORIDE 0.9 % IV SOLN
Freq: Once | INTRAVENOUS | Status: AC
  Administered 2018-10-22: 09:00:00 via INTRAVENOUS
  Filled 2018-10-22: qty 250

## 2018-10-22 MED ORDER — SODIUM CHLORIDE 0.9 % IV SOLN
Freq: Once | INTRAVENOUS | Status: AC
  Administered 2018-10-22: 11:00:00 via INTRAVENOUS
  Filled 2018-10-22: qty 5

## 2018-10-22 NOTE — Assessment & Plan Note (Signed)
she has mild peripheral neuropathy, likely related to side effects of treatment. It is only mild, not bothering the patient. I will observe for now If it gets worse in the future, I will consider modifying the dose of the treatment  

## 2018-10-22 NOTE — Assessment & Plan Note (Signed)
She tolerated treatment well except for mild pancytopenia, nausea and fatigue due to incorrect medication taken for nausea. I will continue to see her on a weekly basis for further supportive care We will proceed with treatment tomorrow without dose adjustment

## 2018-10-22 NOTE — Assessment & Plan Note (Signed)
She has been taking antiemetics and correctly With the help of the interpreter, I clarify the correct way to take Compazine and Zofran as needed. I recommend increase fluid intake as tolerated.

## 2018-10-22 NOTE — Assessment & Plan Note (Signed)
She has mild pancytopenia due to recent chemotherapy She is not symptomatic Observe only for now If her CBC is worse next week, I will consider dose adjustment

## 2018-10-22 NOTE — Progress Notes (Signed)
[place] OFFICE PROGRESS NOTE  Patient Care Team: Antony Blackbird, MD as PCP - General (Family Medicine) Jacqulyn Liner, RN as Oncology Nurse Navigator (Oncology)  ASSESSMENT & PLAN:  Recurrent cervical cancer [facility]) She tolerated treatment well except for mild pancytopenia, nausea and fatigue due to incorrect medication taken for nausea. I will continue to see her on a weekly basis for further supportive care We will proceed with treatment tomorrow without dose adjustment  Pancytopenia, acquired ([place]) She has mild pancytopenia due to recent chemotherapy She is not symptomatic Observe only for now If her CBC is worse next week, I will consider dose adjustment  Chemotherapy-induced nausea She has been taking antiemetics and correctly With the help of the interpreter, I clarify the correct way to take Compazine and Zofran as needed. I recommend increase fluid intake as tolerated.  Peripheral neuropathy due to chemotherapy [facility]) she has mild peripheral neuropathy, likely related to side effects of treatment. It is only mild, not bothering the patient. I will observe for now If it gets worse in the future, I will consider modifying the dose of the treatment    No orders of the defined types were placed in this encounter.   INTERVAL HISTORY: Please see below for problem oriented charting. She returns with Spanish interpreter for further follow-up She complains of fatigue, poor energy, sedation, persistent mild neuropathy that comes and goes and poor oral intake When I question the way she was taking her antiemetics, she was taking Zofran for the first 3 days after chemo and none after that.  She was taking round-the-clock Compazine and requests a refill She denies constipation. The patient denies any recent signs or symptoms of bleeding such as spontaneous epistaxis, hematuria or hematochezia.   SUMMARY OF ONCOLOGIC HISTORY: Oncology History   Recurrent cervical  cancer to the vagina HPV positive from 2019 specimen     Recurrent cervical cancer ([place])   06/11/2011 Pathology Results    1. Endocervix, curettage DETACHED FRAGMENTS OF SQUAMOUS MUCOSA WITH KOILOCYTIC ATYPIA, SEE COMMENT. 2. Cervix, biopsy LOW GRADE SQUAMOUS INTRAEPITHELIAL LESION, CIN-I (MILD DYSPLASIA), SEE COMMENT. Microscopic Comment 1. 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 2. The findings    01/09/2012 Pathology Results    LOW GRADE SQUAMOUS INTRAEPITHELIAL LESION: CIN-1/ HPV (LSIL). ENDOMETRIAL CELLS ARE PRESENT.    01/12/2018 Pathology Results    Cervix, biopsy - INVASIVE SQUAMOUS CELL CARCINOMA - SEE COMMENT    02/08/2018 Pathology Results    1. Lymph nodes, regional resection, right pelvic - EIGHT LYMPH NODES, NEGATIVE FOR CARCINOMA (0/8). 2. Lymph nodes, regional resection, left pelvic - TWELVE LYMPH NODES, NEGATIVE FOR CARCINOMA (0/12). 3. Uterus +/- tubes/ovaries, neoplastic, cervix, bilateral tubes and ovaries, upper third of vagina - INVASIVE MODERATELY DIFFERENTIATED SQUAMOUS CELL CARCINOMA, 2.6 CM, INVOLVING ANTERIOR PORTION OF CERVIX FROM 9 O'CLOCK TO 3 O'CLOCK. - TUMOR INVADES FOR DEPTH OF 0.5 CM AND IS CONFINED TO THE CERVIX. - ALL RESECTION MARGINS ARE NEGATIVE FOR CARCINOMA; THE CLOSEST IS THE VAGINAL CUFF MARGIN AT 1 CM. - NEGATIVE FOR LYMPHOVASCULAR OR PERINEURAL INVASION. - BENIGN ENDOMETRIAL POLYP, 2.6 CM. - BENIGN LEIOMYOMA, 2.2 CM - BENIGN INACTIVE ENDOMETRIUM. - BENIGN BILATERAL OVARIES AND FALLOPIAN TUBES. - SEE ONCOLOGY TABLE. Microscopic Comment 3. UTERINE CERVIX: Resection Procedure: Radial hysterectomy. Tumor Size: 2.6 cm. Histologic Type: Squamous cell carcinoma. Histologic Grade: G2: Moderately differentiated. Stromal Invasion: Depth of stromal invasion (millimeters): 5 mm. Horizontal extent longitudinal/length (if applicable#) (millimeters): 20 mm. Horizontal extent circumferential/width (  if applicable#)  (millimeters): 26 mm. Other Tissue/ Organ: Not involved. Margins: Negative for carcinoma. Lymphovascular Invasion: Not identified. Regional Lymph Nodes: All lymph nodes negative for tumor cells Total Number of Lymph Nodes Examined: 20. Number of Sentinel Nodes Examined (if applicable): 0. Pathologic Stage Classification (pTNM, AJCC 8th Edition): pT1b1, pN0. Ancillary Studies: Not applicable. Representative Tumor Block: 3E, 21F, and 3G.    02/08/2018 Surgery    Operation: Robotic-assisted type III radical laparoscopic hysterectomy with bilateral salpingoophorectomy and bilateral pelvic lymphadenectomy  Surgeon: Donaciano Eva  Operative Findings:  : 2-3cm pedunculated exophytic mass from right anterior cervix. No clinical involvement of the parametria, no suspicious nodes    02/16/2018 Surgery    Procedure: 1. Cystoscopy, bilateral retrograde pyelograms with interpretation 2. Diagnostic ureteroscopy 3. Bilateral ureteral stent placement  Surgeon: Ardis Hughs, MD  Intraoperative findings:  #1: The retrograde pyelogram on the patient's right side initially demonstrated significant contrast extravasation several centimeters from the ureteral orifice.  There was proximal hydroureteronephrosis. #2: Ureteroscopy of the right ureter demonstrated a full-thickness ureteral injury likely thermal in nature approximately 2-1/2 cm from the UVJ. #3: The retrograde on the patient's left side demonstrated severe extravasation with no contrast getting beyond the first centimeter of the ureter.  Once beyond this area with a ureteroscope the ureter was mild to moderately dilated. 4.:  Ureteroscopy of the left ureter demonstrated an area approximately 2 cm from the UVJ and lasting approximately a centimeter and a half of devitalized tissue, full-thickness, likely thermal in nature. #5: 5 French x22 cm Polaris stents were placed bilaterally.     03/22/2018 Imaging    Highly suspicious  nodule in the inferior right thyroid lobe corresponds with the hypermetabolic activity on the PET-CT. Recommend ultrasound-guided biopsy of this right thyroid nodule.  No other thyroid nodules.    04/22/2018 Surgery    Procedure: 4. Cystoscopy 5. Bilateral retrograde pyelogram with interpretation 6. Bilateral ureteral stent exchange 7. Left ureteroscopy, diagnostic  Surgeon: Ardis Hughs, MD  Intraoperative findings:  #1: On speculum exam there was a tine from the right ureteral stent (Polaris) noted at the vaginal cuff.  The remainder of the vaginal cuff was healed, including the area around the ureteral stent tine. 2.:  The right retrograde pyelogram was performed using 10 cc of Omni, and demonstrated normal caliber ureter with no significant hydroureteronephrosis.  There did not appear to be any communication or extravasation of contrast in the distal ureter or UVJ with the vagina. 3.:  The left retrograde pyelogram demonstrated a small area of extravasation at the UVJ, nearly the intramural ureter, that seemed to communicate with the patient's vagina.  There were no other significant filling defects, there is no hydroureteronephrosis. 4.:  Left diagnostic ureteroscopy demonstrated an abnormality within the intramural or very distal UVJ region where there was some bullous edema and likely the site of the fistula.  There was no identifiable communication.  The remaining aspect of the ureter was normal.    05/28/2018 Surgery    Procedure: 8. Bilateral retrograde pyelogram with interpretation 9. Bilateral diagnostic ureteroscopy 10. Bilateral ureteral stent exchange 11. Bilateral nephrostomy tube removal  Surgeon: Ardis Hughs, MD  Intraoperative findings:  #1: 5 French open-ended ureteral catheter was used to perform a retrograde pyelogram in the patient's left ureter which demonstrated normal caliber ureter with no hydro-.  However, there was a narrowed segment at  the intramural ureter and just at the UVJ with a prominent fistula draining into the  vagina. #2: I removed the wire to see the how well the ureter with drain, it did not drain well at all.  As such I tried to repassed the wire and ultimately had to pass a ureteroscope through the distal ureter in order to get the wire up into the left renal pelvis.  This noted distorted and abnormal mucosa in the intravesical and ureterovesical junction. #3: The patient's right sided ureteral stent distal had migrated into the patient's vagina.  In order to remove the stent I had to perform ureteroscopy on the right ureter cannulating the right ureteral orifice and advancing it through the intramural ureter and beyond the distal ureter.  Once I was up beyond the fistula I passed the wire through the scope and into the right renal pelvis.  Once the wire was in the pelvis I removed the scope and pulled the stent through the patient's vagina. 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 #5: A 24 cm x 6 French double-J ureteral stent was placed in the right ureter. #6: A 22 cm x 5 French Polaris catheter was placed in the patient's left ureter    07/02/2018 Surgery    Procedure: 1. Robotic assisted laparoscopic bilateral ureteral reimplant  Surgeon: Ardis Hughs, MD First Assistant: Dr. [name], MD  Intraoperative findings:  #1: The patient's ureters were thickened, and adherent to the sidewalls, but there were able to be reimplanted without any tension. #2: The patient had a mid urethral sling mesh noted around the suprapubic bone that was unmolested.    08/27/2018 Pathology Results    Vagina, biopsy, left upper vaginal side - wall - SQUAMOUS CELL CARCINOMA.    09/15/2018 Cancer Staging    Staging form: Cervix Uteri, AJCC 7th Edition -  Pathologic: FIGO Stage IB1 (T1b1, N0, cM0) - Signed by Heath Lark, MD on 09/16/2018    09/27/2018 Procedure    Successful placement of a right IJ approach Power Port with ultrasound and fluoroscopic guidance. The catheter is ready for use.    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     REVIEW OF SYSTEMS:   Constitutional: Denies fevers, chills or abnormal weight loss Eyes: Denies blurriness of vision Ears, nose, mouth, throat, and face: Denies mucositis or sore throat Respiratory: Denies cough, dyspnea or wheezes Cardiovascular: Denies palpitation, chest discomfort or lower extremity swelling Gastrointestinal:  Denies nausea, heartburn or change in bowel habits Skin: Denies abnormal skin rashes Lymphatics: Denies new lymphadenopathy or easy bruising Neurological:Denies numbness, tingling or new weaknesses Behavioral/Psych: Mood is stable, no new changes  All other systems were reviewed with the patient and are negative.  I have reviewed the past medical history, past surgical history, social history and family history with the patient and they are unchanged from previous note.  ALLERGIES:  has No Known Allergies.  MEDICATIONS:  Current Outpatient Medications  Medication Sig Dispense Refill  . acetaminophen (TYLENOL) 500 MG tablet Take 2 tablets (1,000 mg total) by mouth every 6 (six) hours as needed for moderate pain, fever  or headache. 30 tablet 0  . lidocaine-prilocaine (EMLA) cream Apply to affected area once 30 g 3  . ondansetron (ZOFRAN) 8 MG tablet Take 1 tablet (8 mg total) by mouth every 8 (eight) hours as needed. Start on the third day after chemotherapy. 30 tablet 1   . prochlorperazine (COMPAZINE) 10 MG tablet Take 1 tablet (10 mg total) by mouth every 6 (six) hours as needed (Nausea or vomiting). 30 tablet 1   No current facility-administered medications for this visit.     PHYSICAL EXAMINATION: ECOG PERFORMANCE STATUS: 1 - Symptomatic but completely ambulatory  Vitals:   10/21/18 1122  BP: 123/69  Pulse: 91  Resp: 18  Temp: 98.1 F (36.7 C)  SpO2: 100%   Filed Weights   10/21/18 1122  Weight: 145 lb 12.8 oz (66.1 kg)    GENERAL:alert, no distress and comfortable SKIN: skin color, texture, turgor are normal, no rashes or significant lesions EYES: normal, Conjunctiva are pink and non-injected, sclera clear OROPHARYNX:no exudate, no erythema and lips, buccal mucosa, and tongue normal  NECK: supple, thyroid normal size, non-tender, without nodularity LYMPH:  no palpable lymphadenopathy in the cervical, axillary or inguinal LUNGS: clear to auscultation and percussion with normal breathing effort HEART: regular rate & rhythm and no murmurs and no lower extremity edema ABDOMEN:abdomen soft, non-tender and normal bowel sounds Musculoskeletal:no cyanosis of digits and no clubbing  NEURO: alert & oriented x 3 with fluent speech, no focal motor/sensory deficits  LABORATORY DATA:  I have reviewed the data as listed    Component Value Date/Time   NA 137 10/20/2018 1055   NA 140 05/04/2018 1657   K 4.6 10/20/2018 1055   CL 99 10/20/2018 1055   CO2 29 10/20/2018 1055   GLUCOSE 96 10/20/2018 1055   BUN 11 10/20/2018 1055   BUN 11 05/04/2018 1657   CREATININE 0.73 10/20/2018 1055   CALCIUM 9.2 10/20/2018 1055   PROT 7.6 10/20/2018 1055   PROT 7.3 05/04/2018 1657   ALBUMIN 4.0 10/20/2018 1055   ALBUMIN 4.3 05/04/2018 1657   AST 15 10/20/2018 1055   ALT 16 10/20/2018 1055   ALKPHOS 91 10/20/2018 1055   BILITOT 0.4 10/20/2018 1055   BILITOT <0.2 05/04/2018 1657   GFRNONAA >60 10/20/2018 1055   GFRAA >60 10/20/2018 1055    No results  found for: SPEP, UPEP  Lab Results  Component Value Date   WBC 2.5 (L) 10/20/2018   NEUTROABS 1.8 10/20/2018   HGB 10.9 (L) 10/20/2018   HCT 33.6 (L) 10/20/2018   MCV 86.6 10/20/2018   PLT 212 10/20/2018      Chemistry      Component Value Date/Time   NA 137 10/20/2018 1055   NA 140 05/04/2018 1657   K 4.6 10/20/2018 1055   CL 99 10/20/2018 1055   CO2 29 10/20/2018 1055   BUN 11 10/20/2018 1055   BUN 11 05/04/2018 1657   CREATININE 0.73 10/20/2018 1055      Component Value Date/Time   CALCIUM 9.2 10/20/2018 1055   ALKPHOS 91 10/20/2018 1055   AST 15 10/20/2018 1055   ALT 16 10/20/2018 1055   BILITOT 0.4 10/20/2018 1055   BILITOT <0.2 05/04/2018 1657       RADIOGRAPHIC STUDIES: I have personally reviewed the radiological images as listed and agreed with the findings in the report. [place] Fna Bx Thyroid 1st Lesion Afirma  Result Date: 09/23/2018 INDICATION: Indeterminate thyroid nodule EXAM: ULTRASOUND GUIDED FINE NEEDLE ASPIRATION OF INDETERMINATE THYROID  NODULE COMPARISON:  Thyroid ultrasound on 03/25/2018 MEDICATIONS: None COMPLICATIONS: None immediate. TECHNIQUE: Informed written consent was obtained from the patient after a discussion of the risks, benefits and alternatives to treatment. Questions regarding the procedure were encouraged and answered. A timeout was performed prior to the initiation of the procedure. Pre-procedural ultrasound scanning demonstrated unchanged size and appearance of the indeterminate nodule within the inferior right lobe of the thyroid gland. The procedure was planned. The neck was prepped in the usual sterile fashion, and a sterile drape was applied covering the operative field. A timeout was performed prior to the initiation of the procedure. Local anesthesia was provided with 1% lidocaine. Under direct ultrasound guidance, 5 FNA biopsies were performed with 25 gauge needles. Multiple ultrasound images were saved for procedural documentation  purposes. The samples were prepared and submitted to pathology. Limited post procedural scanning was negative for hematoma or additional complication. Dressings were placed. The patient tolerated the above procedures procedure well without immediate postprocedural complication. FINDINGS: Nodule reference number based on prior diagnostic ultrasound: 1 Maximum size: 1.2 cm Location: Right; Inferior ACR TI-RADS risk category: TR5 (>/= 7 points) Reason for biopsy: meets ACR TI-RADS criteria Ultrasound imaging confirms appropriate placement of the needles within the thyroid nodule. IMPRESSION: Technically successful ultrasound guided fine needle aspiration of 1.2 cm inferior right thyroid nodule. Electronically Signed   By: Aletta Edouard M.D.   On: 09/23/2018 15:51   Ir Imaging Guided Port Insertion  Result Date: 09/27/2018 INDICATION: 58 year old female with endo cervical cancer in need of durable venous access for chemotherapy. EXAM: IMPLANTED PORT A CATH PLACEMENT WITH ULTRASOUND AND FLUOROSCOPIC GUIDANCE MEDICATIONS: 2 g Ancef; The antibiotic was administered within an appropriate time interval prior to skin puncture. ANESTHESIA/SEDATION: Versed 2 mg IV; Fentanyl 100 mcg IV; Moderate Sedation Time:  21 minutes The patient was continuously monitored during the procedure by the interventional radiology nurse under my direct supervision. FLUOROSCOPY TIME:  0 minutes, 12 seconds (1 mGy) COMPLICATIONS: None immediate. PROCEDURE: The right neck and chest was prepped with chlorhexidine, and draped in the usual sterile fashion using maximum barrier technique (cap and mask, sterile gown, sterile gloves, large sterile sheet, hand hygiene and cutaneous antiseptic). Local anesthesia was attained by infiltration with 1% lidocaine with epinephrine. Ultrasound demonstrated patency of the right internal jugular vein, and this was documented with an image. Under real-time ultrasound guidance, this vein was accessed with a 21  gauge micropuncture needle and image documentation was performed. A small dermatotomy was made at the access site with an 11 scalpel. A 0.018" wire was advanced into the SVC and the access needle exchanged for a 76F micropuncture vascular sheath. The 0.018" wire was then removed and a 0.035" wire advanced into the IVC. An appropriate location for the subcutaneous reservoir was selected below the clavicle and an incision was made through the skin and underlying soft tissues. The subcutaneous tissues were then dissected using a combination of blunt and sharp surgical technique and a pocket was formed. A single lumen power injectable portacatheter was then tunneled through the subcutaneous tissues from the pocket to the dermatotomy and the port reservoir placed within the subcutaneous pocket. The venous access site was then serially dilated and a peel away vascular sheath placed over the wire. The wire was removed and the port catheter advanced into position under fluoroscopic guidance. The catheter tip is positioned in the superior cavoatrial junction. This was documented with a spot image. The portacatheter was then tested and found to flush  and aspirate well. The port was flushed with saline followed by 100 units/mL heparinized saline. The pocket was then closed in two layers using first subdermal inverted interrupted absorbable sutures followed by a running subcuticular suture. The epidermis was then sealed with Dermabond. The dermatotomy at the venous access site was also closed with Dermabond. IMPRESSION: Successful placement of a right IJ approach Power Port with ultrasound and fluoroscopic guidance. The catheter is ready for use. Electronically Signed   By: Jacqulynn Cadet M.D.   On: 09/27/2018 13:13    All questions were answered. The patient knows to call the clinic with any problems, questions or concerns. No barriers to learning was detected.  I spent 25 minutes counseling the patient face to face.  The total time spent in the appointment was 30 minutes and more than 50% was on counseling and review of test results  Heath Lark, MD 10/22/2018 8:27 AM

## 2018-10-22 NOTE — Patient Instructions (Signed)
Coronavirus (COVID-19) Are you at risk?  Are you at risk for the Coronavirus (COVID-19)?  To be considered HIGH RISK for Coronavirus (COVID-19), you have to meet the following criteria:  . Traveled to [place], [place], [place], [place] or [place]; or in the [place] to [place], [place], [place], or [place]; and have fever, cough, and shortness of breath within the last 2 weeks of travel OR . Been in close contact with a person diagnosed with COVID-19 within the last 2 weeks and have fever, cough, and shortness of breath . IF YOU DO NOT MEET THESE CRITERIA, YOU ARE CONSIDERED LOW RISK FOR COVID-19.  What to do if you are HIGH RISK for COVID-19?  Marland Kitchen If you are having a medical emergency, call 911. . Seek medical care right away. Before you go to a doctor's office, urgent care or emergency department, call ahead and tell them about your recent travel, contact with someone diagnosed with COVID-19, and your symptoms. You should receive instructions from your physician's office regarding next steps of care.  . When you arrive at healthcare provider, tell the healthcare staff immediately you have returned from visiting [place], [place], [place], [place] or [place]; or traveled in the [place] to [place], [place], [place], or [place]; in the last two weeks or you have been in close contact with a person diagnosed with COVID-19 in the last 2 weeks.   . Tell the health care staff about your symptoms: fever, cough and shortness of breath. . After you have been seen by a medical provider, you will be either: o Tested for (COVID-19) and discharged home on quarantine except to seek medical care if symptoms worsen, and asked to  - Stay home and avoid contact with others until you get your results (4-5 days)  - Avoid travel on public transportation if possible (such as bus, train, or airplane) or o Sent to the Emergency Department by EMS for evaluation, COVID-19 testing, and possible  admission depending on your condition and test results.  What to do if you are LOW RISK for COVID-19?  Reduce your risk of any infection by using the same precautions used for avoiding the common cold or flu:  Marland Kitchen Wash your hands often with soap and warm water for at least 20 seconds.  If soap and water are not readily available, use an alcohol-based hand sanitizer with at least 60% alcohol.  . If coughing or sneezing, cover your mouth and nose by coughing or sneezing into the elbow areas of your shirt or coat, into a tissue or into your sleeve (not your hands). . Avoid shaking hands with others and consider head nods or verbal greetings only. . Avoid touching your eyes, nose, or mouth with unwashed hands.  . Avoid close contact with people who are sick. . Avoid places or events with large numbers of people in one location, like concerts or sporting events. . Carefully consider travel plans you have or are making. . If you are planning any travel outside or inside the [place], visit the CDC's Travelers' Health webpage for the latest health notices. . If you have some symptoms but not all symptoms, continue to monitor at home and seek medical attention if your symptoms worsen. . If you are having a medical emergency, call 911.   [place] / e-Visit: [email or web address]         [facility]: [facility]: [facility]: [phone]       Cisplatin injection Qu es este medicamento? El CISPLATINO es un agente quimioteraputico. Este medicamento acta sobre las clulas que se dividen rpidamente, como las clulas cancergenas, y finalmente provoca la muerte de estas clulas. Se utiliza en el tratamiento de muchos tipos de cncer, como los cnceres de vejiga, ovarios y testculos. Este medicamento puede ser utilizado  para otros usos; si tiene alguna pregunta consulte con su proveedor de atencin mdica o con su farmacutico. MARCAS COMUNES: Platinol, Platinol -AQ Qu le debo informar a mi profesional de la salud antes de tomar este medicamento? Necesita saber si usted presenta alguno de los siguientes problemas o situaciones: -trastornos sanguneos -problemas auditivos -enfermedad renal -radioterapia reciente o continuada -una reaccin alrgica o inusual al cisplatino, al carboplatino, a otros agentes quimioteraputicos, a otros medicamentos, alimentos, colorantes o conservantes -si est embarazada o buscando quedar embarazada -si est amamantando a un beb Cmo debo utilizar este medicamento? Este medicamento se administra mediante infusin por va intravenosa. Lo administra un profesional de la salud calificado en un hospital o en un entorno clnico. Hable con su pediatra para informarse acerca del uso de este medicamento en nios. Puede requerir atencin especial. Sobredosis: Pngase en contacto inmediatamente con un centro toxicolgico o una sala de urgencia si usted cree que haya tomado demasiado medicamento. ATENCIN: [employer] es solo para usted. No comparta este medicamento con nadie. Qu sucede si me olvido de una dosis? Es importante no olvidar ninguna dosis. Informe a su mdico o a su profesional de la salud si no puede asistir a Photographer. Qu puede interactuar con este medicamento? -dofetilida -foscarnet -medicamentos para convulsiones -medicamentos para incrementar los conteos sanguneos, tales como filgrastim, pegfilgrastim, sargramostim -probenecid -piridoxina usado con altretamina -rituximab -ciertos antibiticos, tales como amicacina, gentamicina, neomicina, polimixina B, estreptomicina, tobramicina -sulfinpirazona -vacunas -zalcitabina Consulte a su mdico o a su profesional de la salud antes de tomar cualquiera de los siguientes  medicamentos: -acetaminofeno -aspirina -ibuprofeno -quetoprofeno -naproxeno Puede ser que esta lista no menciona todas las posibles interacciones. Informe a su profesional de [employer]	[place] de [employer] productos a base de hierbas, medicamentos de [place] o suplementos nutritivos que est tomando. Si usted fuma, consume bebidas alcohlicas o si utiliza drogas ilegales, indqueselo tambin a su profesional de [employer]	[place]. Algunas sustancias pueden interactuar con su medicamento. A qu debo estar atento al usar Coca[employer]? Se supervisar su condicin atentamente mientras reciba este medicamento. Tendr que hacerse anlisis de sangre peridicos mientras est tomando este medicamento. Este medicamento puede hacerle sentir un Nurse, mental health. Esto es normal ya que la quimioterapia afecta tanto a las clulas sanas como a las clulas cancerosas. Si presenta alguno de los [employer], infrmelos. Sin embargo, contine con el tratamiento aun si se siente enfermo, a menos que su mdico le indique que lo suspenda. En algunos casos, podr recibir [employer] para ayudarle con los efectos secundarios. Siga las instrucciones para usarlos. Consulte a su mdico o a su profesional de la salud por asesoramiento si tiene fiebre, escalofros, dolor de garganta o cualquier otro sntoma de resfro o gripe. No se trate usted mismo. Este medicamento puede reducir la capacidad del cuerpo para combatir infecciones. Trate de no acercarse a personas que estn enfermas. [employer] puede aumentar el riesgo de magulladuras o sangrado. Consulte a su mdico o a su  profesional de la salud si observa sangrados inusuales. Proceda con cuidado al cepillar sus dientes, usar hilo dental o Risk manager palillos para los dientes, ya que puede contraer una infeccin o Therapist, art con mayor facilidad. Si se somete a algn tratamiento dental, informe a su dentista que est [employer]. Evite tomar productos que  contienen aspirina, acetaminofeno, ibuprofeno, naproxeno o quetoprofeno a menos que as lo indique su mdico. Estos productos pueden disimular la fiebre. No se debe quedar embarazada mientras recibe este medicamento. Las mujeres deben informar a su mdico si estn buscando quedar embarazadas o si creen que estn embarazadas. Existe la posibilidad de efectos secundarios graves a un beb sin nacer. Para ms informacin hable con su profesional de la salud o su farmacutico. No debe Economist a un beb mientras est usando este medicamento. Mientras recibe Coca[employer]Cola, beba lquido como le haya indicado. Esto ayudar a [employer]. Si tiene diarrea, llame a su mdico o a su profesional de [employer]	[place]. No se trate usted mismo. Qu efectos secundarios puedo tener al [employer] este medicamento? Efectos secundarios que debe informar a su mdico o a Barrister's clerk de la salud tan pronto como sea posible: -Chief of Staff como erupcin cutnea, picazn o urticarias, hinchazn de la cara, labios o lengua -signos de infeccin - fiebre o escalofros, tos, dolor de garganta, dolor o dificultad para orinar -signos de reduccin de plaquetas o sangrado - magulladuras, puntos rojos en la piel, heces de color oscuro o con aspecto alquitranado, sangrado por la nariz -signos de reduccin de glbulos rojos - cansancio o debilidad inusual, desmayos, sensacin de [employer] -problemas respiratorios -cambios de audicin -dolor de gota -conteos sanguneos bajos - este medicamento puede reducir la cantidad de glbulos blancos, glbulos rojos y Art gallery manager. Su riesgo de infeccin y sangrado puede ser mayor. -nuseas, vmito -dolor, hinchazn, enrojecimiento o Actor de la inyeccin -dolor, hormigueo, entumecimiento de manos o pies -problemas de coordinacin, de movimiento -dificultad para orinar o cambios en el volumen de orina Efectos secundarios que, por lo general, no requieren atencin mdica  (debe informarlos a su mdico o a su profesional de la salud si persisten o si son molestos): -cambios en la visin -prdida del apetito -sabor metlico en la boca o cambios en el sentido del gusto Puede ser que esta lista no menciona todos los posibles efectos secundarios. Comunquese a su mdico por asesoramiento mdico [employer]. Usted puede informar los efectos secundarios a la FDA por telfono al 1-800-FDA-1088. Dnde debo guardar mi medicina? Este medicamento se administra en hospitales o clnicas y no necesitar guardarlo en su domicilio. ATENCIN: Este folleto es un resumen. Puede ser que no cubra toda la posible informacin. Si usted tiene preguntas acerca de esta medicina, consulte con su mdico, su farmacutico o su profesional de Technical sales engineer.  2019 Elsevier/[employer] Standard (2014-09-12 00:00:00)

## 2018-10-25 ENCOUNTER — Ambulatory Visit
Admission: RE | Admit: 2018-10-25 | Discharge: 2018-10-25 | Disposition: A | Discharge status: Home / Self Care | Payer: BLUE CROSS/BLUE SHIELD | Source: Ambulatory Visit | Attending: Radiation Oncology | Admitting: Radiation Oncology

## 2018-10-25 ENCOUNTER — Other Ambulatory Visit: Payer: Self-pay

## 2018-10-25 DIAGNOSIS — Z510 Encounter for antineoplastic radiation therapy: Secondary | ICD-10-CM | POA: Diagnosis not present

## 2018-10-26 ENCOUNTER — Other Ambulatory Visit: Payer: Self-pay

## 2018-10-26 ENCOUNTER — Ambulatory Visit
Admission: RE | Admit: 2018-10-26 | Discharge: 2018-10-26 | Disposition: A | Discharge status: Home / Self Care | Payer: BLUE CROSS/BLUE SHIELD | Source: Ambulatory Visit | Attending: Radiation Oncology | Admitting: Radiation Oncology

## 2018-10-26 DIAGNOSIS — Z510 Encounter for antineoplastic radiation therapy: Secondary | ICD-10-CM | POA: Diagnosis not present

## 2018-10-27 ENCOUNTER — Ambulatory Visit
Admission: RE | Admit: 2018-10-27 | Discharge: 2018-10-27 | Disposition: A | Discharge status: Home / Self Care | Payer: BLUE CROSS/BLUE SHIELD | Source: Ambulatory Visit | Attending: Radiation Oncology | Admitting: Radiation Oncology

## 2018-10-27 ENCOUNTER — Other Ambulatory Visit: Payer: Self-pay

## 2018-10-27 ENCOUNTER — Telehealth: Payer: Self-pay | Admitting: *Deleted

## 2018-10-27 ENCOUNTER — Inpatient Hospital Stay: Payer: BLUE CROSS/BLUE SHIELD

## 2018-10-27 DIAGNOSIS — Z510 Encounter for antineoplastic radiation therapy: Secondary | ICD-10-CM | POA: Diagnosis not present

## 2018-10-27 DIAGNOSIS — C531 Malignant neoplasm of exocervix: Secondary | ICD-10-CM

## 2018-10-27 LAB — COMPREHENSIVE METABOLIC PANEL
ALT: 16 U/L (ref 0–44)
AST: 16 U/L (ref 15–41)
Albumin: 3.8 g/dL (ref 3.5–5.0)
Alkaline Phosphatase: 88 U/L (ref 38–126)
Anion gap: 9 (ref 5–15)
BILIRUBIN TOTAL: 0.4 mg/dL (ref 0.3–1.2)
BUN: 11 mg/dL (ref 6–20)
CO2: 31 mmol/L (ref 22–32)
Calcium: 9 mg/dL (ref 8.9–10.3)
Chloride: 98 mmol/L (ref 98–111)
Creatinine, Ser: 0.74 mg/dL (ref 0.44–1.00)
GFR calc Af Amer: >60 mL/min (ref >60)
GFR calc non Af Amer: >60 mL/min (ref >60)
Glucose, Bld: 102 mg/dL — ABNORMAL HIGH (ref 70–99)
POTASSIUM: 4.5 mmol/L (ref 3.5–5.1)
Sodium: 138 mmol/L (ref 135–145)
Total Protein: 7.3 g/dL (ref 6.5–8.1)

## 2018-10-27 LAB — CBC WITH DIFFERENTIAL/PLATELET
Abs Immature Granulocytes: 0.01 10*3/uL (ref 0.00–0.07)
Basophils Absolute: 0 10*3/uL (ref 0.0–0.1)
Basophils Relative: 0 %
Eosinophils Absolute: 0.1 10*3/uL (ref 0.0–0.5)
Eosinophils Relative: 3 %
HCT: 32.6 % — ABNORMAL LOW (ref 36.0–46.0)
Hemoglobin: 10.8 g/dL — ABNORMAL LOW (ref 12.0–15.0)
Immature Granulocytes: 0 %
Lymphocytes Relative: 15 %
Lymphs Abs: 0.4 10*3/uL — ABNORMAL LOW (ref 0.7–4.0)
MCH: 28.9 pg (ref 26.0–34.0)
MCHC: 33.1 g/dL (ref 30.0–36.0)
MCV: 87.2 fL (ref 80.0–100.0)
Monocytes Absolute: 0.3 10*3/uL (ref 0.1–1.0)
Monocytes Relative: 12 %
Neutro Abs: 1.6 10*3/uL — ABNORMAL LOW (ref 1.7–7.7)
Neutrophils Relative %: 70 %
Platelets: 189 10*3/uL (ref 150–400)
RBC: 3.74 MIL/uL — ABNORMAL LOW (ref 3.87–5.11)
RDW: 15.6 % — ABNORMAL HIGH (ref 11.5–15.5)
WBC: 2.3 10*3/uL — ABNORMAL LOW (ref 4.0–10.5)
nRBC: 0 % (ref 0.0–0.2)

## 2018-10-27 LAB — MAGNESIUM: Magnesium: 2 mg/dL (ref 1.7–2.4)

## 2018-10-27 NOTE — Telephone Encounter (Signed)
Called interp. Almyra Free) to inform of this patient's new HDR [facility], spoke with Almyra Free and gave her all dates and times and she will call patient and forward this message to her.

## 2018-10-28 ENCOUNTER — Ambulatory Visit
Admission: RE | Admit: 2018-10-28 | Discharge: 2018-10-28 | Disposition: A | Discharge status: Home / Self Care | Payer: BLUE CROSS/BLUE SHIELD | Source: Ambulatory Visit | Attending: Radiation Oncology | Admitting: Radiation Oncology

## 2018-10-28 ENCOUNTER — Other Ambulatory Visit: Payer: Self-pay

## 2018-10-28 ENCOUNTER — Telehealth: Payer: Self-pay | Admitting: Hematology and Oncology

## 2018-10-28 ENCOUNTER — Inpatient Hospital Stay (HOSPITAL_BASED_OUTPATIENT_CLINIC_OR_DEPARTMENT_OTHER): Payer: BLUE CROSS/BLUE SHIELD | Admitting: Hematology and Oncology

## 2018-10-28 ENCOUNTER — Encounter: Payer: Self-pay | Admitting: Hematology and Oncology

## 2018-10-28 DIAGNOSIS — C539 Malignant neoplasm of cervix uteri, unspecified: Secondary | ICD-10-CM

## 2018-10-28 DIAGNOSIS — G620 Drug-induced polyneuropathy: Secondary | ICD-10-CM

## 2018-10-28 DIAGNOSIS — Z510 Encounter for antineoplastic radiation therapy: Secondary | ICD-10-CM | POA: Diagnosis not present

## 2018-10-28 DIAGNOSIS — D61818 Other pancytopenia: Secondary | ICD-10-CM | POA: Diagnosis not present

## 2018-10-28 DIAGNOSIS — C73 Malignant neoplasm of thyroid gland: Secondary | ICD-10-CM

## 2018-10-28 DIAGNOSIS — R110 Nausea: Secondary | ICD-10-CM

## 2018-10-28 DIAGNOSIS — T451X5A Adverse effect of antineoplastic and immunosuppressive drugs, initial encounter: Secondary | ICD-10-CM

## 2018-10-28 MED ORDER — PROCHLORPERAZINE MALEATE 10 MG PO TABS: 10.0000 mg | ORAL_TABLET | Freq: Four times a day (QID) | ORAL | 1 refills | Status: DC | PRN

## 2018-10-28 MED ORDER — ONDANSETRON HCL 8 MG PO TABS: 8.0000 mg | ORAL_TABLET | Freq: Three times a day (TID) | ORAL | 1 refills | Status: DC | PRN

## 2018-10-28 NOTE — Progress Notes (Signed)
[place] OFFICE PROGRESS NOTE  Patient Care Team: Antony Blackbird, MD as PCP - General (Family Medicine) Jacqulyn Liner, RN as Oncology Nurse Navigator (Oncology)  ASSESSMENT & PLAN:  Recurrent cervical cancer [facility]) She tolerated treatment well except for mild pancytopenia, nausea and fatigue with slight neuropathy. She will complete last dose of chemotherapy tomorrow She has a few more weeks of radiation I plan to see her back in May with port flushes and repeat blood work I will order her repeat imaging study in the future We will proceed with treatment tomorrow without dose adjustment  Pancytopenia, acquired ([place]) She has mild pancytopenia due to recent chemotherapy She is not symptomatic Observe only for now We will proceed with treatment without dose adjustment  Chemotherapy-induced nausea She is taking her antiemetics correctly with less nausea.  She will continue the same as needed  Peripheral neuropathy due to chemotherapy [facility]) she has mild peripheral neuropathy, likely related to side effects of treatment. It is only mild, not bothering the patient. I will observe for now    No orders of the defined types were placed in this encounter.   INTERVAL HISTORY: Please see below for problem oriented charting. 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 Endocervix, curettage DETACHED FRAGMENTS OF SQUAMOUS MUCOSA WITH KOILOCYTIC ATYPIA, SEE COMMENT. 2. Cervix, biopsy LOW GRADE SQUAMOUS INTRAEPITHELIAL LESION, CIN-I (MILD DYSPLASIA), SEE COMMENT. Microscopic Comment 1. 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 2.  The findings    01/09/2012 Pathology Results    LOW GRADE SQUAMOUS INTRAEPITHELIAL LESION: CIN-1/ HPV (LSIL). ENDOMETRIAL CELLS ARE PRESENT.    01/12/2018 Pathology Results    Cervix, biopsy - INVASIVE SQUAMOUS CELL CARCINOMA - SEE COMMENT    02/08/2018 Pathology Results    1. Lymph nodes, regional resection, right pelvic - EIGHT LYMPH NODES, NEGATIVE FOR CARCINOMA (0/8). 2. Lymph nodes, regional resection, left pelvic - TWELVE LYMPH NODES, NEGATIVE FOR CARCINOMA (0/12). 3. Uterus +/- tubes/ovaries, neoplastic, cervix, bilateral tubes and ovaries, upper third of vagina - INVASIVE MODERATELY DIFFERENTIATED SQUAMOUS CELL CARCINOMA, 2.6 CM, INVOLVING ANTERIOR PORTION OF CERVIX FROM 9 O'CLOCK TO 3 O'CLOCK. - TUMOR INVADES FOR DEPTH OF 0.5 CM AND IS CONFINED TO THE CERVIX. - ALL RESECTION MARGINS ARE NEGATIVE FOR CARCINOMA; THE CLOSEST IS THE VAGINAL CUFF MARGIN AT 1 CM. - NEGATIVE FOR LYMPHOVASCULAR OR PERINEURAL INVASION. - BENIGN ENDOMETRIAL POLYP, 2.6 CM. - BENIGN LEIOMYOMA, 2.2 CM - BENIGN INACTIVE ENDOMETRIUM. - BENIGN BILATERAL OVARIES AND FALLOPIAN TUBES. - SEE ONCOLOGY TABLE. Microscopic Comment 3. UTERINE CERVIX: Resection Procedure: Radial hysterectomy. Tumor Size: 2.6 cm. Histologic Type: Squamous cell carcinoma. Histologic Grade: G2: Moderately differentiated. Stromal Invasion: Depth of stromal invasion (millimeters): 5 mm. Horizontal extent longitudinal/length (if applicable#) (millimeters): 20 mm. Horizontal extent circumferential/width (if applicable#) (millimeters): 26 mm. Other Tissue/ Organ: Not involved. Margins: Negative for carcinoma. Lymphovascular Invasion: Not identified. Regional Lymph Nodes: All lymph nodes negative for tumor cells Total Number of Lymph Nodes Examined: 20. Number of Sentinel Nodes Examined (if applicable): 0. Pathologic Stage Classification (pTNM, AJCC 8th Edition): pT1b1, pN0. Ancillary Studies: Not applicable. Representative Tumor  Block: 3E, [id number], and 3G.    02/08/2018  Surgery    Operation: Robotic-assisted type III radical laparoscopic hysterectomy with bilateral salpingoophorectomy and bilateral pelvic lymphadenectomy  Surgeon: Donaciano Eva  Operative Findings:  : 2-3cm pedunculated exophytic mass from right anterior cervix. No clinical involvement of the parametria, no suspicious nodes    02/16/2018 Surgery    Procedure: 1. Cystoscopy, bilateral retrograde pyelograms with interpretation 2. Diagnostic ureteroscopy 3. Bilateral ureteral stent placement  Surgeon: Ardis Hughs, MD  Intraoperative findings:  #1: The retrograde pyelogram on the patient's right side initially demonstrated significant contrast extravasation several centimeters from the ureteral orifice.  There was proximal hydroureteronephrosis. #2: Ureteroscopy of the right ureter demonstrated a full-thickness ureteral injury likely thermal in nature approximately 2-1/2 cm from the UVJ. #3: The retrograde on the patient's left side demonstrated severe extravasation with no contrast getting beyond the first centimeter of the ureter.  Once beyond this area with a ureteroscope the ureter was mild to moderately dilated. 4.:  Ureteroscopy of the left ureter demonstrated an area approximately 2 cm from the UVJ and lasting approximately a centimeter and a half of devitalized tissue, full-thickness, likely thermal in nature. #5: 5 French x22 cm Polaris stents were placed bilaterally.     03/22/2018 Imaging    Highly suspicious nodule in the inferior right thyroid lobe corresponds with the hypermetabolic activity on the PET-CT. Recommend ultrasound-guided biopsy of this right thyroid nodule.  No other thyroid nodules.    04/22/2018 Surgery    Procedure: 4. Cystoscopy 5. Bilateral retrograde pyelogram with interpretation 6. Bilateral ureteral stent exchange 7. Left ureteroscopy, diagnostic  Surgeon: Ardis Hughs,  MD  Intraoperative findings:  #1: On speculum exam there was a tine from the right ureteral stent (Polaris) noted at the vaginal cuff.  The remainder of the vaginal cuff was healed, including the area around the ureteral stent tine. 2.:  The right retrograde pyelogram was performed using 10 cc of Omni, and demonstrated normal caliber ureter with no significant hydroureteronephrosis.  There did not appear to be any communication or extravasation of contrast in the distal ureter or UVJ with the vagina. 3.:  The left retrograde pyelogram demonstrated a small area of extravasation at the UVJ, nearly the intramural ureter, that seemed to communicate with the patient's vagina.  There were no other significant filling defects, there is no hydroureteronephrosis. 4.:  Left diagnostic ureteroscopy demonstrated an abnormality within the intramural or very distal UVJ region where there was some bullous edema and likely the site of the fistula.  There was no identifiable communication.  The remaining aspect of the ureter was normal.    05/28/2018 Surgery    Procedure: 8. Bilateral retrograde pyelogram with interpretation 9. Bilateral diagnostic ureteroscopy 10. Bilateral ureteral stent exchange 11. Bilateral nephrostomy tube removal  Surgeon: Ardis Hughs, MD  Intraoperative findings:  #1: 5 French open-ended ureteral catheter was used to perform a retrograde pyelogram in the patient's left ureter which demonstrated normal caliber ureter with no hydro-.  However, there was a narrowed segment at the intramural ureter and just at the UVJ with a prominent fistula draining into the vagina. #2: I removed the wire to see the how well the ureter with drain, it did not drain well at all.  As such I tried to repassed the wire and ultimately had to pass a ureteroscope through the distal ureter in order to get the wire up into the left renal pelvis.  This noted distorted and abnormal mucosa in the  intravesical and ureterovesical junction. #3:  The patient's right sided ureteral stent distal had migrated into the patient's vagina.  In order to remove the stent I had to perform ureteroscopy on the right ureter cannulating the right ureteral orifice and advancing it through the intramural ureter and beyond the distal ureter.  Once I was up beyond the fistula I passed the wire through the scope and into the right renal pelvis.  Once the wire was in the pelvis I removed the scope and pulled the stent through the patient's vagina. 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 #5: A 24 cm x 6 French double-J ureteral stent was placed in the right ureter. #6: A 22 cm x 5 French Polaris catheter was placed in the patient's left ureter    07/02/2018 Surgery    Procedure: 1. Robotic assisted laparoscopic bilateral ureteral reimplant  Surgeon: Ardis Hughs, MD First Assistant: Dr. [name], MD  Intraoperative findings:  #1: The patient's ureters were thickened, and adherent to the sidewalls, but there were able to be reimplanted without any tension. #2: The patient had a mid urethral sling mesh noted around the suprapubic bone that was unmolested.    08/27/2018 Pathology Results    Vagina, biopsy, left upper vaginal side - wall - SQUAMOUS CELL CARCINOMA.    09/15/2018 Cancer Staging    Staging form: Cervix Uteri, AJCC 7th Edition - Pathologic: FIGO Stage IB1 (T1b1, N0, cM0) - Signed by Heath Lark, MD on 09/16/2018    09/27/2018 Procedure    Successful placement of a right IJ approach Power Port with ultrasound and fluoroscopic guidance. The catheter is ready for use.    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     REVIEW OF SYSTEMS:   Constitutional: Denies fevers, chills or abnormal weight loss Eyes: Denies blurriness of vision Ears, nose, mouth, throat, and face: Denies mucositis or sore throat Respiratory: Denies cough, dyspnea or wheezes Cardiovascular: Denies palpitation, chest discomfort or lower extremity swelling Gastrointestinal:  Denies nausea, heartburn or change in bowel habits Skin: Denies abnormal skin rashes Lymphatics: Denies new lymphadenopathy or easy bruising Neurological:Denies numbness, tingling or new weaknesses Behavioral/Psych: Mood is stable, no new changes  All other systems were reviewed with the patient and are negative.  I have reviewed the past medical history, past surgical history, social history and family history with the patient and they are unchanged from previous note.  ALLERGIES:  has No Known Allergies.  MEDICATIONS:  Current Outpatient Medications  Medication Sig Dispense Refill  . acetaminophen (TYLENOL) 500 MG tablet Take 2 tablets (1,000 mg total) by mouth every 6 (six) hours as needed for moderate pain, fever or headache. 30 tablet 0  . lidocaine-prilocaine (EMLA) cream Apply to affected area once 30 g 3  . ondansetron (ZOFRAN) 8 MG tablet Take 1 tablet (8 mg total) by mouth every 8 (eight) hours as needed. Start on the third day after chemotherapy. 30 tablet 1  . prochlorperazine (COMPAZINE) 10 MG tablet Take 1 tablet (10 mg total) by mouth every 6 (six) hours  as needed (Nausea or vomiting). 30 tablet 1   No current facility-administered medications for this visit.     PHYSICAL EXAMINATION: ECOG PERFORMANCE STATUS: 1 - Symptomatic but completely ambulatory  Vitals:   10/28/18 1315  BP: 116/73  Pulse: 98  Resp: 18  Temp: 98.3 F (36.8 C)  SpO2:  100%   Filed Weights   10/28/18 1315  Weight: 148 lb (67.1 kg)    GENERAL:alert, no distress and comfortable SKIN: skin color, texture, turgor are normal, no rashes or significant lesions EYES: normal, Conjunctiva are pink and non-injected, sclera clear OROPHARYNX:no exudate, no erythema and lips, buccal mucosa, and tongue normal  NECK: supple, thyroid normal size, non-tender, without nodularity LYMPH:  no palpable lymphadenopathy in the cervical, axillary or inguinal LUNGS: clear to auscultation and percussion with normal breathing effort HEART: regular rate & rhythm and no murmurs and no lower extremity edema ABDOMEN:abdomen soft, non-tender and normal bowel sounds Musculoskeletal:no cyanosis of digits and no clubbing  NEURO: alert & oriented x 3 with fluent speech, no focal motor/sensory deficits  LABORATORY DATA:  I have reviewed the data as listed    Component Value Date/Time   NA 138 10/27/2018 1102   NA 140 05/04/2018 1657   K 4.5 10/27/2018 1102   CL 98 10/27/2018 1102   CO2 31 10/27/2018 1102   GLUCOSE 102 (H) 10/27/2018 1102   BUN 11 10/27/2018 1102   BUN 11 05/04/2018 1657   CREATININE 0.74 10/27/2018 1102   CALCIUM 9.0 10/27/2018 1102   PROT 7.3 10/27/2018 1102   PROT 7.3 05/04/2018 1657   ALBUMIN 3.8 10/27/2018 1102   ALBUMIN 4.3 05/04/2018 1657   AST 16 10/27/2018 1102   ALT 16 10/27/2018 1102   ALKPHOS 88 10/27/2018 1102   BILITOT 0.4 10/27/2018 1102   BILITOT <0.2 05/04/2018 1657   GFRNONAA >60 10/27/2018 1102   GFRAA >60 10/27/2018 1102    No results found for: SPEP, UPEP  Lab Results  Component Value Date   WBC 2.3 (L) 10/27/2018   NEUTROABS 1.6 (L) 10/27/2018   HGB 10.8 (L) 10/27/2018   HCT 32.6 (L) 10/27/2018   MCV 87.2 10/27/2018   PLT 189 10/27/2018      Chemistry      Component Value Date/Time   NA 138 10/27/2018 1102   NA 140 05/04/2018 1657   K 4.5 10/27/2018 1102   CL 98 10/27/2018 1102   CO2 31 10/27/2018 1102   BUN 11  10/27/2018 1102   BUN 11 05/04/2018 1657   CREATININE 0.74 10/27/2018 1102      Component Value Date/Time   CALCIUM 9.0 10/27/2018 1102   ALKPHOS 88 10/27/2018 1102   AST 16 10/27/2018 1102   ALT 16 10/27/2018 1102   BILITOT 0.4 10/27/2018 1102   BILITOT <0.2 05/04/2018 1657      All questions were answered. The patient knows to call the clinic with any problems, questions or concerns. No barriers to learning was detected.  I spent 15 minutes counseling the patient face to face. The total time spent in the appointment was 20 minutes and more than 50% was on counseling and review of test results  Heath Lark, MD 10/28/2018 1:27 PM

## 2018-10-28 NOTE — Telephone Encounter (Signed)
Gave avs and calendar ° °

## 2018-10-28 NOTE — Assessment & Plan Note (Signed)
She tolerated treatment well except for mild pancytopenia, nausea and fatigue with slight neuropathy. She will complete last dose of chemotherapy tomorrow She has a few more weeks of radiation I plan to see her back in May with port flushes and repeat blood work I will order her repeat imaging study in the future We will proceed with treatment tomorrow without dose adjustment

## 2018-10-28 NOTE — Assessment & Plan Note (Signed)
She has mild pancytopenia due to recent chemotherapy She is not symptomatic Observe only for now We will proceed with treatment without dose adjustment

## 2018-10-28 NOTE — Assessment & Plan Note (Signed)
She is taking her antiemetics correctly with less nausea.  She will continue the same as needed

## 2018-10-28 NOTE — Assessment & Plan Note (Signed)
she has mild peripheral neuropathy, likely related to side effects of treatment. It is only mild, not bothering the patient. I will observe for now 

## 2018-10-29 ENCOUNTER — Ambulatory Visit: Payer: BLUE CROSS/BLUE SHIELD

## 2018-10-29 ENCOUNTER — Ambulatory Visit
Admission: RE | Admit: 2018-10-29 | Discharge: 2018-10-29 | Disposition: A | Discharge status: Home / Self Care | Payer: BLUE CROSS/BLUE SHIELD | Source: Ambulatory Visit | Attending: Radiation Oncology | Admitting: Radiation Oncology

## 2018-10-29 ENCOUNTER — Other Ambulatory Visit: Payer: Self-pay

## 2018-10-29 ENCOUNTER — Inpatient Hospital Stay: Payer: BLUE CROSS/BLUE SHIELD

## 2018-10-29 VITALS — BP 122/79 | HR 89 | Temp 98.5°F | Resp 16 | Wt 147.8 lb

## 2018-10-29 DIAGNOSIS — Z510 Encounter for antineoplastic radiation therapy: Secondary | ICD-10-CM | POA: Diagnosis not present

## 2018-10-29 DIAGNOSIS — C539 Malignant neoplasm of cervix uteri, unspecified: Secondary | ICD-10-CM

## 2018-10-29 MED ORDER — PALONOSETRON HCL INJECTION 0.25 MG/5ML
0.2500 mg | Freq: Once | INTRAVENOUS | Status: AC
  Administered 2018-10-29: 0.25 mg via INTRAVENOUS

## 2018-10-29 MED ORDER — PALONOSETRON HCL INJECTION 0.25 MG/5ML
INTRAVENOUS | Status: AC
  Filled 2018-10-29: qty 5

## 2018-10-29 MED ORDER — SODIUM CHLORIDE 0.9 % IV SOLN
40.0000 mg/m2 | Freq: Once | INTRAVENOUS | Status: AC
  Administered 2018-10-29: 66 mg via INTRAVENOUS
  Filled 2018-10-29: qty 66

## 2018-10-29 MED ORDER — SODIUM CHLORIDE 0.9 % IV SOLN
Freq: Once | INTRAVENOUS | Status: AC
  Administered 2018-10-29: 11:00:00 via INTRAVENOUS
  Filled 2018-10-29: qty 5

## 2018-10-29 MED ORDER — POTASSIUM CHLORIDE 2 MEQ/ML IV SOLN
Freq: Once | INTRAVENOUS | Status: AC
  Administered 2018-10-29: 09:00:00 via INTRAVENOUS
  Filled 2018-10-29: qty 10

## 2018-10-29 MED ORDER — HEPARIN SOD (PORK) LOCK FLUSH 100 UNIT/ML IV SOLN
500.0000 [IU] | Freq: Once | INTRAVENOUS | Status: AC | PRN
  Administered 2018-10-29: 500 [IU]
  Filled 2018-10-29: qty 5

## 2018-10-29 MED ORDER — SODIUM CHLORIDE 0.9% FLUSH
10.0000 mL | INTRAVENOUS | Status: DC | PRN
  Administered 2018-10-29: 10 mL
  Filled 2018-10-29: qty 10

## 2018-10-29 MED ORDER — SODIUM CHLORIDE 0.9 % IV SOLN
Freq: Once | INTRAVENOUS | Status: AC
  Administered 2018-10-29: 09:00:00 via INTRAVENOUS
  Filled 2018-10-29: qty 250

## 2018-10-29 NOTE — Patient Instructions (Signed)
[facility] Discharge Instructions for Patients Receiving Chemotherapy  Today you received the following chemotherapy agents Cisplatin  To help prevent nausea and vomiting after your treatment, we encourage you to take your nausea medication as directed  If you develop nausea and vomiting that is not controlled by your nausea medication, call the clinic.   BELOW ARE SYMPTOMS THAT SHOULD BE REPORTED IMMEDIATELY:  *FEVER GREATER THAN 100.5 F  *CHILLS WITH OR WITHOUT FEVER  NAUSEA AND VOMITING THAT IS NOT CONTROLLED WITH YOUR NAUSEA MEDICATION  *UNUSUAL SHORTNESS OF BREATH  *UNUSUAL BRUISING OR BLEEDING  TENDERNESS IN MOUTH AND THROAT WITH OR WITHOUT PRESENCE OF ULCERS  *URINARY PROBLEMS  *BOWEL PROBLEMS  UNUSUAL RASH Items with * indicate a potential emergency and should be followed up as soon as possible.  Feel free to call the clinic should you have any questions or concerns. The clinic phone number is [phone].  Please show the CHEMO ALERT CARD at check-in to the Emergency Department and triage nurse.   

## 2018-11-01 ENCOUNTER — Ambulatory Visit
Admission: RE | Admit: 2018-11-01 | Discharge: 2018-11-01 | Disposition: A | Discharge status: Home / Self Care | Payer: BLUE CROSS/BLUE SHIELD | Source: Ambulatory Visit | Attending: Radiation Oncology | Admitting: Radiation Oncology

## 2018-11-01 ENCOUNTER — Other Ambulatory Visit: Payer: Self-pay

## 2018-11-01 DIAGNOSIS — Z923 Personal history of irradiation: Secondary | ICD-10-CM

## 2018-11-01 DIAGNOSIS — Z510 Encounter for antineoplastic radiation therapy: Secondary | ICD-10-CM | POA: Diagnosis not present

## 2018-11-01 HISTORY — DX: Personal history of irradiation: Z92.3

## 2018-11-09 ENCOUNTER — Other Ambulatory Visit: Payer: Self-pay

## 2018-11-09 ENCOUNTER — Inpatient Hospital Stay: Payer: BLUE CROSS/BLUE SHIELD | Attending: Gynecologic Oncology

## 2018-11-09 ENCOUNTER — Telehealth: Payer: Self-pay | Admitting: *Deleted

## 2018-11-09 ENCOUNTER — Other Ambulatory Visit: Payer: Self-pay | Admitting: Gynecologic Oncology

## 2018-11-09 DIAGNOSIS — R300 Dysuria: Secondary | ICD-10-CM | POA: Diagnosis not present

## 2018-11-09 LAB — URINALYSIS, COMPLETE (UACMP) WITH MICROSCOPIC
Bilirubin Urine: NEGATIVE
Glucose, UA: NEGATIVE mg/dL
Ketones, ur: NEGATIVE mg/dL
Nitrite: POSITIVE — AB
Protein, ur: NEGATIVE mg/dL
Specific Gravity, Urine: 1.009 (ref 1.005–1.030)
pH: 8 (ref 5.0–8.0)

## 2018-11-09 MED ORDER — CIPROFLOXACIN HCL 250 MG PO TABS: 250.0000 mg | ORAL_TABLET | Freq: Two times a day (BID) | ORAL | 0 refills | Status: DC

## 2018-11-09 MED FILL — CIPROFLOXACIN HCL 250 MG TA: 250 | 7 days supply | Qty: 14 | Fill #0

## 2018-11-09 NOTE — Telephone Encounter (Signed)
Called Almyra Free the interp. and had her call this patient to remind of HDR [facility] for 11-10-18, spoke with Almyra Free and she has done so.

## 2018-11-09 NOTE — Progress Notes (Signed)
Almyra Free, interpreter, notified the office of patient's symptoms of frequent urination and dysuria. Plan to check urine sample today for analysis and culture and patient will be notified of results.

## 2018-11-09 NOTE — Progress Notes (Signed)
UA with probable evidence of UTI. Culture pending. Per Dr. Denman George, plan to begin treatment with Cipro at this time.  Almyra Free interpreter to let patient know.

## 2018-11-10 ENCOUNTER — Ambulatory Visit
Admission: RE | Admit: 2018-11-10 | Discharge: 2018-11-10 | Disposition: A | Discharge status: Home / Self Care | Payer: BLUE CROSS/BLUE SHIELD | Source: Ambulatory Visit | Attending: Radiation Oncology | Admitting: Radiation Oncology

## 2018-11-10 ENCOUNTER — Other Ambulatory Visit: Payer: Self-pay

## 2018-11-10 ENCOUNTER — Encounter: Payer: Self-pay | Admitting: Radiation Oncology

## 2018-11-10 DIAGNOSIS — Z510 Encounter for antineoplastic radiation therapy: Secondary | ICD-10-CM | POA: Insufficient documentation

## 2018-11-10 DIAGNOSIS — C531 Malignant neoplasm of exocervix: Secondary | ICD-10-CM | POA: Diagnosis present

## 2018-11-10 DIAGNOSIS — C539 Malignant neoplasm of cervix uteri, unspecified: Secondary | ICD-10-CM

## 2018-11-10 LAB — URINE CULTURE

## 2018-11-10 NOTE — Patient Instructions (Signed)
Coronavirus (COVID-19) Are you at risk?  Are you at risk for the Coronavirus (COVID-19)?  To be considered HIGH RISK for Coronavirus (COVID-19), you have to meet the following criteria:  . Traveled to [place], [place], [place], [place] or [place]; or in the [place] to [place], [place], [place], or [place]; and have fever, cough, and shortness of breath within the last 2 weeks of travel OR . Been in close contact with a person diagnosed with COVID-19 within the last 2 weeks and have fever, cough, and shortness of breath . IF YOU DO NOT MEET THESE CRITERIA, YOU ARE CONSIDERED LOW RISK FOR COVID-19.  What to do if you are HIGH RISK for COVID-19?  . If you are having a medical emergency, call 911. . Seek medical care right away. Before you go to a doctor's office, urgent care or emergency department, call ahead and tell them about your recent travel, contact with someone diagnosed with COVID-19, and your symptoms. You should receive instructions from your physician's office regarding next steps of care.  . When you arrive at healthcare provider, tell the healthcare staff immediately you have returned from visiting [place], [place], [place], [place] or [place]; or traveled in the [place] to [place], [place], [place], or [place]; in the last two weeks or you have been in close contact with a person diagnosed with COVID-19 in the last 2 weeks.   . Tell the health care staff about your symptoms: fever, cough and shortness of breath. . After you have been seen by a medical provider, you will be either: o Tested for (COVID-19) and discharged home on quarantine except to seek medical care if symptoms worsen, and asked to  - Stay home and avoid contact with others until you get your results (4-5 days)  - Avoid travel on public transportation if possible (such as bus, train, or airplane) or o Sent to the Emergency Department by EMS for evaluation, COVID-19 testing, and possible  admission depending on your condition and test results.  What to do if you are LOW RISK for COVID-19?  Reduce your risk of any infection by using the same precautions used for avoiding the common cold or flu:  . Wash your hands often with soap and warm water for at least 20 seconds.  If soap and water are not readily available, use an alcohol-based hand sanitizer with at least 60% alcohol.  . If coughing or sneezing, cover your mouth and nose by coughing or sneezing into the elbow areas of your shirt or coat, into a tissue or into your sleeve (not your hands). . Avoid shaking hands with others and consider head nods or verbal greetings only. . Avoid touching your eyes, nose, or mouth with unwashed hands.  . Avoid close contact with people who are sick. . Avoid places or events with large numbers of people in one location, like concerts or sporting events. . Carefully consider travel plans you have or are making. . If you are planning any travel outside or inside the [place], visit the CDC's Travelers' Health webpage for the latest health notices. . If you have some symptoms but not all symptoms, continue to monitor at home and seek medical attention if your symptoms worsen. . If you are having a medical emergency, call 911.   ADDITIONAL HEALTHCARE OPTIONS FOR PATIENTS  [facility] / e-Visit: [email or web address]         [facility]: [phone]  [place]   [facility]: [phone]                   [facility]: [phone]   

## 2018-11-10 NOTE — Progress Notes (Signed)
Pt presents today for new [place] HDR with Dr. Sondra Come. Pt interpreter Almyra Free is present via phone. Pt education on what to expect via Almyra Free. Pt with UTI and frequency every 30 min. Pt reports skin inflammation and attributes it to UTI.   BP 111/72 (BP Location: Right Arm, Patient Position: Sitting)   Pulse 89   Temp 99.2 F (37.3 C) (Oral)   Resp 20   Ht 4\' 11"  (1.499 m)   Wt 149 lb (67.6 kg)   LMP 11/16/2012   SpO2 100%   BMI 30.09 kg/m   Wt Readings from Last 3 Encounters:  11/10/18 149 lb (67.6 kg)  10/29/18 147 lb 12.8 oz (67 kg)  10/28/18 148 lb (67.1 kg)   Loma Sousa, RN BSN

## 2018-11-16 ENCOUNTER — Telehealth: Payer: Self-pay | Admitting: *Deleted

## 2018-11-16 ENCOUNTER — Telehealth: Payer: Self-pay

## 2018-11-16 NOTE — Telephone Encounter (Addendum)
Almyra Free told [name] that the urine culture did not show an infection.  Specimen was contaminated. Dr. Denman George does not want to continue with ATB.  She wants [name] to see Dr. Herrick/Urology.  Almyra Free said that Dr. Louis Meckel is not in her network with her new insurance.  She has made an appointment with a urologist in her network but it has taken a while to get an appointment. Almyra Free will ask her for the urologist's name for our records.

## 2018-11-16 NOTE — Telephone Encounter (Signed)
Called Almyra Free the interp. to ask her to remind her of [place]. For 11-17-18 @ 11 am, Almyra Free stated that she would be glad to do that

## 2018-11-17 ENCOUNTER — Telehealth: Payer: Self-pay | Admitting: Oncology

## 2018-11-17 ENCOUNTER — Ambulatory Visit: Payer: BLUE CROSS/BLUE SHIELD | Admitting: Radiation Oncology

## 2018-11-17 NOTE — Telephone Encounter (Signed)
[facility] and talked to [place], [place] to see if Perlita can be seen by Dr. Louis Meckel.  She said [employer] is out of network but that if she can pay $250 for the visit and any labs costs she could be seen.  Notified Almyra Free, Medical Interpreter who notified [place].  She would like to make the appointment and is willing to pay the $250.00.  Appointment scheduled 11/18/18 at 3 pm with Dr. Louis Meckel.  Almyra Free will notify patient.  UA and Culture results faxed to [facility].

## 2018-11-18 ENCOUNTER — Ambulatory Visit
Admission: RE | Admit: 2018-11-18 | Discharge: 2018-11-18 | Disposition: A | Discharge status: Home / Self Care | Payer: BLUE CROSS/BLUE SHIELD | Source: Ambulatory Visit | Attending: Radiation Oncology | Admitting: Radiation Oncology

## 2018-11-18 ENCOUNTER — Other Ambulatory Visit: Payer: Self-pay

## 2018-11-18 DIAGNOSIS — C539 Malignant neoplasm of cervix uteri, unspecified: Secondary | ICD-10-CM

## 2018-11-18 NOTE — Progress Notes (Signed)
  Radiation Oncology         [phone]) [phone] ________________________________  Name: Amy Morrow MRN: [id number]  Date: 11/18/2018  DOB: 03-29-1961  CC: Antony Blackbird, MD  [name], MD  HDR BRACHYTHERAPY NOTE  DIAGNOSIS: 58 y.o. female with recurrent cervical cancer   Simple treatment device note: Patient had construction of her custom vaginal cylinder. She will be treated with a 3.0 cm diameter segmented cylinder. This conforms to her anatomy without undue discomfort.  Vaginal brachytherapy procedure node: The patient was brought to the [facility] suite. Identity was confirmed. All relevant records and images related to the planned course of therapy were reviewed. The patient freely provided informed written consent to proceed with treatment after reviewing the details related to the planned course of therapy. The consent form was witnessed and verified by the simulation staff. Then, the patient was set-up in a stable reproducible supine position for radiation therapy. Pelvic exam revealed the vaginal cuff to be intact. The patient's custom vaginal cylinder was placed in the proximal vagina. This was affixed to the CT/MR stabilization plate to prevent slippage. Patient tolerated the placement well.  Verification simulation note:  A fiducial marker was placed within the vaginal cylinder. An AP and lateral film was then obtained through the pelvis area. This documented accurate position of the vaginal cylinder for treatment.  HDR BRACHYTHERAPY TREATMENT  The remote afterloading device was affixed to the vaginal cylinder by catheter. Patient then proceeded to undergo her second high-dose-rate treatment directed at the proximal vagina. The patient was prescribed a dose of 6 gray to be delivered to the mucosal surface. Treatment length was 3.0 cm. Patient was treated with 1 channel using 7 dwell positions. Treatment time was 173.20 seconds. Iridium 192 was the high-dose-rate source for treatment. The  patient tolerated the treatment well. After completion of her therapy, a radiation survey was performed documenting return of the iridium source into the GammaMed safe.   PLAN: The patient will return on 11/24/18 for her third HDR treatment. ________________________________  Blair Promise, PhD, MD   This document serves as a record of services personally performed by Gery Pray, MD. It was created on his behalf by Rae Lips, a trained medical scribe. The creation of this record is based on the scribe's personal observations and the provider's statements to them. This document has been checked and approved by the attending provider.

## 2018-11-23 ENCOUNTER — Telehealth: Payer: Self-pay | Admitting: *Deleted

## 2018-11-23 NOTE — Telephone Encounter (Signed)
CALLED JULIE THE INTERP. TO ASK HER TO CALL THIS PATIENT TO REMIND OF HDR [place]. FOR 11-24-18 @ 11 AM, SPOKE WITH JULIE AND SHE SAID THAT SHE WOULD BE GLAD TO DO THIS

## 2018-11-24 ENCOUNTER — Other Ambulatory Visit: Payer: Self-pay

## 2018-11-24 ENCOUNTER — Ambulatory Visit
Admission: RE | Admit: 2018-11-24 | Discharge: 2018-11-24 | Disposition: A | Discharge status: Home / Self Care | Payer: BLUE CROSS/BLUE SHIELD | Source: Ambulatory Visit | Attending: Radiation Oncology | Admitting: Radiation Oncology

## 2018-11-24 DIAGNOSIS — C539 Malignant neoplasm of cervix uteri, unspecified: Secondary | ICD-10-CM

## 2018-11-24 NOTE — Progress Notes (Signed)
  Radiation Oncology         [phone]) [phone] ________________________________  Name: Amy Morrow MRN: [id number]  Date: 11/24/2018  DOB: 04/24/1961  CC: Antony Blackbird, MD  [name], MD  HDR BRACHYTHERAPY NOTE  DIAGNOSIS: Recurrent cervical cancer   Simple treatment device note: Patient had construction of her custom vaginal cylinder. She will be treated with a 3.0 cm diameter segmented cylinder. This conforms to her anatomy without undue discomfort.  Vaginal brachytherapy procedure node: The patient was brought to the [facility] suite. Identity was confirmed. All relevant records and images related to the planned course of therapy were reviewed. The patient freely provided informed written consent to proceed with treatment after reviewing the details related to the planned course of therapy. The consent form was witnessed and verified by the simulation staff. Then, the patient was set-up in a stable reproducible supine position for radiation therapy. Pelvic exam revealed the vaginal cuff to be intact . The patient's custom vaginal cylinder was placed in the proximal vagina. This was affixed to the CT/MR stabilization plate to prevent slippage. Patient tolerated the placement well.  Verification simulation note:  A fiducial marker was placed within the vaginal cylinder. An AP and lateral film was then obtained through the pelvis area. This documented accurate position of the vaginal cylinder for treatment.  HDR BRACHYTHERAPY TREATMENT  The remote afterloading device was affixed to the vaginal cylinder by catheter. Patient then proceeded to undergo her third high-dose-rate treatment directed at the proximal vagina. The patient was prescribed a dose of 6.0 gray to be delivered to the mucosal surface. Treatment length was 3.0 cm. Patient was treated with 1 channel using 7 dwell positions. Treatment time was 183.1 seconds. Iridium 192 was the high-dose-rate source for treatment. The patient tolerated  the treatment well. After completion of her therapy, a radiation survey was performed documenting return of the iridium source into the Gamma[employer] safe.   PLAN: Patient will return next week for her fourth and final high-dose-rate treatment ________________________________  Blair Promise, PhD, MD

## 2018-11-26 ENCOUNTER — Encounter: Payer: Self-pay | Admitting: General Practice

## 2018-11-26 NOTE — Progress Notes (Signed)
[place] interpreters [place] [phone]  [place] Team contacted patient to assess for food insecurity and other psychosocial needs during current COVID19 pandemic.  No answer, interpreter left a VM w my contact information and reason for call.    Beverely Pace, [place]

## 2018-11-29 ENCOUNTER — Ambulatory Visit: Payer: Self-pay | Admitting: Gynecologic Oncology

## 2018-11-30 ENCOUNTER — Telehealth: Payer: Self-pay | Admitting: *Deleted

## 2018-11-30 NOTE — Telephone Encounter (Signed)
CALLED JULIE THE INTREP. TO ASK HER TO CALL THIS PATIENT AND REMIND OF HER HDR [place]. FOR 12-01-18 @ 11 AM, SPOKE WITH JULIE AND SHE SAID THAT SHE WOULD DO THIS

## 2018-12-01 ENCOUNTER — Other Ambulatory Visit: Payer: Self-pay

## 2018-12-01 ENCOUNTER — Encounter: Payer: Self-pay | Admitting: Radiation Oncology

## 2018-12-01 ENCOUNTER — Ambulatory Visit
Admission: RE | Admit: 2018-12-01 | Discharge: 2018-12-01 | Disposition: A | Discharge status: Home / Self Care | Payer: BLUE CROSS/BLUE SHIELD | Source: Ambulatory Visit | Attending: Radiation Oncology | Admitting: Radiation Oncology

## 2018-12-01 DIAGNOSIS — C539 Malignant neoplasm of cervix uteri, unspecified: Secondary | ICD-10-CM

## 2018-12-01 NOTE — Progress Notes (Signed)
  Radiation Oncology         [phone] ________________________________  Name: Amy Morrow MRN: [id number]  Date: 12/01/2018  DOB: 1961-02-22  CC: Antony Blackbird, MD  [name], MD  HDR BRACHYTHERAPY NOTE  DIAGNOSIS: Recurrent cervical cancer   Simple treatment device note: Patient had construction of her custom vaginal cylinder. She will be treated with a 3.0 cm diameter segmented cylinder. This conforms to her anatomy without undue discomfort.  Vaginal brachytherapy procedure node: The patient was brought to the [facility] suite. Identity was confirmed. All relevant records and images related to the planned course of therapy were reviewed. The patient freely provided informed written consent to proceed with treatment after reviewing the details related to the planned course of therapy. The consent form was witnessed and verified by the simulation staff. Then, the patient was set-up in a stable reproducible supine position for radiation therapy. Pelvic exam revealed the vaginal cuff to be intact . The patient's custom vaginal cylinder was placed in the proximal vagina. This was affixed to the CT/MR stabilization plate to prevent slippage. Patient tolerated the placement well.  Verification simulation note:  A fiducial marker was placed within the vaginal cylinder. An AP and lateral film was then obtained through the pelvis area. This documented accurate position of the vaginal cylinder for treatment.  HDR BRACHYTHERAPY TREATMENT  The remote afterloading device was affixed to the vaginal cylinder by catheter. Patient then proceeded to undergo her fourth high-dose-rate treatment directed at the proximal vagina. The patient was prescribed a dose of 6.0 gray to be delivered to the mucosal surface. Treatment length was 3.0 cm. Patient was treated with 1 channel using 7 dwell positions. Treatment time was 195.6 seconds. Iridium 192 was the high-dose-rate source for treatment. The patient tolerated  the treatment well. After completion of her therapy, a radiation survey was performed documenting return of the iridium source into the GammaMed safe.   PLAN: She has completed her external beam and HDR treatments. Follow-up in 1 month.  ________________________________  Blair Promise, PhD, MD  This document serves as a record of services personally performed by Gery Pray, MD. It was created on his behalf by Mary-Margaret Loma Messing, a trained medical scribe. The creation of this record is based on the scribe's personal observations and the provider's statements to them. This document has been checked and approved by the attending provider.

## 2018-12-02 NOTE — Progress Notes (Signed)
  Radiation Oncology         [phone] ________________________________  Name: BRIANN SARCHET MRN: [id number]  Date: 12/01/2018  DOB: [birth year]  End of Treatment Note  Diagnosis:   58 y.o. female with recurrent cervical cancer  Indication for treatment:  Curative       Radiation treatment dates:    1. IMRT: 09/27/2018 - 11/01/2018 2. HDR: 11/10/2018, 11/18/2018, 11/24/2018, 12/01/2018  Site/dose:    1. Pelvis, Vagina / 45 Gy in 25 fractions 2. Vaginal Cuff (area of recurrence) / 24 Gy in 4 fractions  Beams/energy:    1. IMRT / 6X Photon 2. HDR Ir-192 Vaginal Brachytherapy   Narrative: The patient tolerated radiation treatment relatively well.  During her external beam treatment, she experienced mild to moderate fatigue as well as some nausea and occasional constipation. She tolerated her HDR treatments well without any signs of acute toxicity.  Plan: The patient has completed radiation treatment. The patient will return to radiation oncology clinic for routine followup in one month. I advised them to call or return sooner if they have any questions or concerns related to their recovery or treatment.  -----------------------------------  Blair Promise, PhD, MD  This document serves as a record of services personally performed by Gery Pray, MD. It was created on his behalf by Rae Lips, a trained medical scribe. The creation of this record is based on the scribe's personal observations and the provider's statements to them. This document has been checked and approved by the attending provider.

## 2018-12-10 ENCOUNTER — Inpatient Hospital Stay: Payer: BLUE CROSS/BLUE SHIELD

## 2018-12-10 ENCOUNTER — Inpatient Hospital Stay (HOSPITAL_BASED_OUTPATIENT_CLINIC_OR_DEPARTMENT_OTHER): Payer: BLUE CROSS/BLUE SHIELD | Admitting: Hematology and Oncology

## 2018-12-10 ENCOUNTER — Inpatient Hospital Stay: Payer: BLUE CROSS/BLUE SHIELD | Attending: Gynecologic Oncology

## 2018-12-10 ENCOUNTER — Encounter: Payer: Self-pay | Admitting: Hematology and Oncology

## 2018-12-10 ENCOUNTER — Other Ambulatory Visit: Payer: Self-pay

## 2018-12-10 DIAGNOSIS — D61818 Other pancytopenia: Secondary | ICD-10-CM

## 2018-12-10 DIAGNOSIS — R87820 Cervical low risk human papillomavirus (HPV) DNA test positive: Secondary | ICD-10-CM | POA: Insufficient documentation

## 2018-12-10 DIAGNOSIS — Z9079 Acquired absence of other genital organ(s): Secondary | ICD-10-CM | POA: Insufficient documentation

## 2018-12-10 DIAGNOSIS — Z923 Personal history of irradiation: Secondary | ICD-10-CM | POA: Diagnosis not present

## 2018-12-10 DIAGNOSIS — Z90722 Acquired absence of ovaries, bilateral: Secondary | ICD-10-CM | POA: Diagnosis not present

## 2018-12-10 DIAGNOSIS — C539 Malignant neoplasm of cervix uteri, unspecified: Secondary | ICD-10-CM

## 2018-12-10 DIAGNOSIS — Z79899 Other long term (current) drug therapy: Secondary | ICD-10-CM | POA: Insufficient documentation

## 2018-12-10 DIAGNOSIS — R300 Dysuria: Secondary | ICD-10-CM | POA: Insufficient documentation

## 2018-12-10 DIAGNOSIS — C73 Malignant neoplasm of thyroid gland: Secondary | ICD-10-CM | POA: Diagnosis not present

## 2018-12-10 DIAGNOSIS — C531 Malignant neoplasm of exocervix: Secondary | ICD-10-CM

## 2018-12-10 LAB — CBC WITH DIFFERENTIAL/PLATELET
Abs Immature Granulocytes: 0.01 10*3/uL (ref 0.00–0.07)
Basophils Absolute: 0 10*3/uL (ref 0.0–0.1)
Basophils Relative: 1 %
Eosinophils Absolute: 0.1 10*3/uL (ref 0.0–0.5)
Eosinophils Relative: 4 %
HCT: 30.5 % — ABNORMAL LOW (ref 36.0–46.0)
Hemoglobin: 10 g/dL — ABNORMAL LOW (ref 12.0–15.0)
Immature Granulocytes: 0 %
Lymphocytes Relative: 24 %
Lymphs Abs: 0.6 10*3/uL — ABNORMAL LOW (ref 0.7–4.0)
MCH: 30.3 pg (ref 26.0–34.0)
MCHC: 32.8 g/dL (ref 30.0–36.0)
MCV: 92.4 fL (ref 80.0–100.0)
Monocytes Absolute: 0.4 10*3/uL (ref 0.1–1.0)
Monocytes Relative: 14 %
Neutro Abs: 1.6 10*3/uL — ABNORMAL LOW (ref 1.7–7.7)
Neutrophils Relative %: 57 %
Platelets: 262 10*3/uL (ref 150–400)
RBC: 3.3 MIL/uL — ABNORMAL LOW (ref 3.87–5.11)
RDW: 18.4 % — ABNORMAL HIGH (ref 11.5–15.5)
WBC: 2.7 10*3/uL — ABNORMAL LOW (ref 4.0–10.5)
nRBC: 0 % (ref 0.0–0.2)

## 2018-12-10 LAB — COMPREHENSIVE METABOLIC PANEL
ALT: 8 U/L (ref 0–44)
AST: 12 U/L — ABNORMAL LOW (ref 15–41)
Albumin: 3.8 g/dL (ref 3.5–5.0)
Alkaline Phosphatase: 85 U/L (ref 38–126)
Anion gap: 7 (ref 5–15)
BUN: 13 mg/dL (ref 6–20)
CO2: 25 mmol/L (ref 22–32)
Calcium: 8.7 mg/dL — ABNORMAL LOW (ref 8.9–10.3)
Chloride: 108 mmol/L (ref 98–111)
Creatinine, Ser: 0.67 mg/dL (ref 0.44–1.00)
GFR calc Af Amer: >60 mL/min (ref >60)
GFR calc non Af Amer: >60 mL/min (ref >60)
Glucose, Bld: 97 mg/dL (ref 70–99)
Potassium: 4 mmol/L (ref 3.5–5.1)
Sodium: 140 mmol/L (ref 135–145)
Total Bilirubin: 0.3 mg/dL (ref 0.3–1.2)
Total Protein: 7.3 g/dL (ref 6.5–8.1)

## 2018-12-10 LAB — MAGNESIUM: Magnesium: 1.9 mg/dL (ref 1.7–2.4)

## 2018-12-10 MED ORDER — SODIUM CHLORIDE 0.9% FLUSH
10.0000 mL | Freq: Once | INTRAVENOUS | Status: AC
  Administered 2018-12-10: 10 mL
  Filled 2018-12-10: qty 10

## 2018-12-10 NOTE — Assessment & Plan Note (Addendum)
She still have persistent pancytopenia but not symptomatic Observe only We discussed neutropenic precaution I suspect it will improve in the future and and I plan to recheck her blood count in the future.

## 2018-12-10 NOTE — Assessment & Plan Note (Signed)
The patient was also diagnosed with thyroid cancer She is not symptomatic I recommend her to hold evaluation until final PET CT scan in July I recommend her to discuss with her primary care doctor for referral to see general surgery for resection in the future

## 2018-12-10 NOTE — Assessment & Plan Note (Signed)
She has completed radiation treatment recently She still have some mild discomfort in her pelvis from recent radiation treatment but overall improving since discontinuation of chemo I plan to order a PET CT scan at the end of July and see her back to review results

## 2018-12-10 NOTE — Progress Notes (Signed)
[place] OFFICE PROGRESS NOTE  Patient Care Team: Antony Blackbird, MD as PCP - General (Family Medicine) [name], RN as Oncology Nurse Navigator (Oncology)  ASSESSMENT & PLAN:  Recurrent cervical cancer [facility]) She has completed radiation treatment recently She still have some mild discomfort in her pelvis from recent radiation treatment but overall improving since discontinuation of chemo I plan to order a PET CT scan at the end of July and see her back to review results  Primary thyroid cancer [facility]) The patient was also diagnosed with thyroid cancer She is not symptomatic I recommend her to hold evaluation until final PET CT scan in July I recommend her to discuss with her primary care doctor for referral to see general surgery for resection in the future  Pancytopenia, acquired [facility]) She still have persistent pancytopenia but not symptomatic Observe only We discussed neutropenic precaution I suspect it will improve in the future and and I plan to recheck her blood count in the future.   Orders Placed This Encounter  Procedures  . NM PET Image Restag (PS) Skull Base To Thigh    Standing Status:   Future    Standing Expiration Date:   12/10/2019    Order Specific Question:   If indicated for the ordered procedure, I authorize the administration of a radiopharmaceutical per Radiology protocol    Answer:   Yes    Order Specific Question:   Preferred imaging location?    Answer:   [facility]    Order Specific Question:   Radiology Contrast Protocol - do NOT remove file path    Answer:   [email or web address]    Order Specific Question:   Is the patient pregnant?    Answer:   No    INTERVAL HISTORY: Please see below for problem oriented charting. 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 Endocervix, curettage DETACHED FRAGMENTS OF SQUAMOUS MUCOSA WITH KOILOCYTIC ATYPIA, SEE COMMENT. 2. Cervix, biopsy LOW GRADE SQUAMOUS INTRAEPITHELIAL LESION, CIN-I (MILD DYSPLASIA), SEE COMMENT. Microscopic Comment 1. 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 2. The findings    01/09/2012 Pathology Results    LOW GRADE SQUAMOUS INTRAEPITHELIAL LESION: CIN-1/ HPV (LSIL). ENDOMETRIAL CELLS ARE PRESENT.    01/12/2018 Pathology Results    Cervix, biopsy - INVASIVE SQUAMOUS CELL CARCINOMA - SEE COMMENT    02/08/2018 Pathology Results    1. Lymph nodes, regional resection, right pelvic - EIGHT LYMPH NODES, NEGATIVE FOR CARCINOMA (0/8). 2. Lymph nodes, regional resection, left pelvic - TWELVE LYMPH NODES, NEGATIVE FOR CARCINOMA (0/12). 3. Uterus +/- tubes/ovaries, neoplastic, cervix, bilateral tubes and ovaries, upper third of vagina - INVASIVE MODERATELY DIFFERENTIATED SQUAMOUS CELL CARCINOMA, 2.6 CM, INVOLVING ANTERIOR PORTION OF CERVIX FROM 9 O'CLOCK TO 3 O'CLOCK. - TUMOR INVADES FOR DEPTH OF 0.5 CM AND IS CONFINED TO THE CERVIX. - ALL RESECTION MARGINS ARE NEGATIVE FOR CARCINOMA; THE CLOSEST IS THE VAGINAL CUFF MARGIN AT 1 CM. - NEGATIVE FOR LYMPHOVASCULAR OR PERINEURAL INVASION. - BENIGN ENDOMETRIAL POLYP, 2.6 CM. - BENIGN LEIOMYOMA, 2.2 CM - BENIGN INACTIVE ENDOMETRIUM. - BENIGN BILATERAL OVARIES AND FALLOPIAN TUBES. - SEE ONCOLOGY TABLE. Microscopic Comment 3. UTERINE  CERVIX: Resection Procedure: Radial hysterectomy. Tumor Size: 2.6 cm. Histologic Type: Squamous cell carcinoma. Histologic Grade: G2: Moderately differentiated. Stromal Invasion: Depth of stromal invasion (millimeters): 5 mm. Horizontal extent longitudinal/length (if applicable#) (millimeters): 20 mm. Horizontal  extent circumferential/width (if applicable#) (millimeters): 26 mm. Other Tissue/ Organ: Not involved. Margins: Negative for carcinoma. Lymphovascular Invasion: Not identified. Regional Lymph Nodes: All lymph nodes negative for tumor cells Total Number of Lymph Nodes Examined: 20. Number of Sentinel Nodes Examined (if applicable): 0. Pathologic Stage Classification (pTNM, AJCC 8th Edition): pT1b1, pN0. Ancillary Studies: Not applicable. Representative Tumor Block: 3E, 52F, and 3G.    02/08/2018 Surgery    Operation: Robotic-assisted type III radical laparoscopic hysterectomy with bilateral salpingoophorectomy and bilateral pelvic lymphadenectomy  Surgeon: Donaciano Eva  Operative Findings:  : 2-3cm pedunculated exophytic mass from right anterior cervix. No clinical involvement of the parametria, no suspicious nodes    02/16/2018 Surgery    Procedure: 1. Cystoscopy, bilateral retrograde pyelograms with interpretation 2. Diagnostic ureteroscopy 3. Bilateral ureteral stent placement  Surgeon: Ardis Hughs, MD  Intraoperative findings:  #1: The retrograde pyelogram on the patient's right side initially demonstrated significant contrast extravasation several centimeters from the ureteral orifice.  There was proximal hydroureteronephrosis. #2: Ureteroscopy of the right ureter demonstrated a full-thickness ureteral injury likely thermal in nature approximately 2-1/2 cm from the UVJ. #3: The retrograde on the patient's left side demonstrated severe extravasation with no contrast getting beyond the first centimeter of the ureter.  Once beyond this area with a ureteroscope the ureter was mild to moderately dilated. 4.:  Ureteroscopy of the left ureter demonstrated an area approximately 2 cm from the UVJ and lasting approximately a centimeter and a half of devitalized tissue, full-thickness, likely thermal in nature. #5: 5 French x22 cm Polaris stents were placed bilaterally.      03/22/2018 Imaging    Highly suspicious nodule in the inferior right thyroid lobe corresponds with the hypermetabolic activity on the PET-CT. Recommend ultrasound-guided biopsy of this right thyroid nodule.  No other thyroid nodules.    04/22/2018 Surgery    Procedure: 4. Cystoscopy 5. Bilateral retrograde pyelogram with interpretation 6. Bilateral ureteral stent exchange 7. Left ureteroscopy, diagnostic  Surgeon: Ardis Hughs, MD  Intraoperative findings:  #1: On speculum exam there was a tine from the right ureteral stent (Polaris) noted at the vaginal cuff.  The remainder of the vaginal cuff was healed, including the area around the ureteral stent tine. 2.:  The right retrograde pyelogram was performed using 10 cc of Omni, and demonstrated normal caliber ureter with no significant hydroureteronephrosis.  There did not appear to be any communication or extravasation of contrast in the distal ureter or UVJ with the vagina. 3.:  The left retrograde pyelogram demonstrated a small area of extravasation at the UVJ, nearly the intramural ureter, that seemed to communicate with the patient's vagina.  There were no other significant filling defects, there is no hydroureteronephrosis. 4.:  Left diagnostic ureteroscopy demonstrated an abnormality within the intramural or very distal UVJ region where there was some bullous edema and likely the site of the fistula.  There was no identifiable communication.  The remaining aspect of the ureter was normal.    05/28/2018 Surgery    Procedure: 8. Bilateral retrograde pyelogram with interpretation 9. Bilateral diagnostic ureteroscopy 10. Bilateral ureteral stent exchange 11. Bilateral nephrostomy tube removal  Surgeon: Ardis Hughs, MD  Intraoperative findings:  #1: 5 French open-ended ureteral catheter was used to perform a  retrograde pyelogram in the patient's left ureter which demonstrated normal caliber ureter with no  hydro-.  However, there was a narrowed segment at the intramural ureter and just at the UVJ with a prominent fistula draining into the vagina. #2: I removed the wire to see the how well the ureter with drain, it did not drain well at all.  As such I tried to repassed the wire and ultimately had to pass a ureteroscope through the distal ureter in order to get the wire up into the left renal pelvis.  This noted distorted and abnormal mucosa in the intravesical and ureterovesical junction. #3: The patient's right sided ureteral stent distal had migrated into the patient's vagina.  In order to remove the stent I had to perform ureteroscopy on the right ureter cannulating the right ureteral orifice and advancing it through the intramural ureter and beyond the distal ureter.  Once I was up beyond the fistula I passed the wire through the scope and into the right renal pelvis.  Once the wire was in the pelvis I removed the scope and pulled the stent through the patient's vagina. 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 #5: A 24 cm x 6 French double-J ureteral stent was placed in the right ureter. #6: A 22 cm x 5 French Polaris catheter was placed in the patient's left ureter    07/02/2018 Surgery    Procedure: 1. Robotic assisted laparoscopic bilateral ureteral reimplant  Surgeon: Ardis Hughs, MD First Assistant: Dr. [name], MD  Intraoperative findings:  #1: The patient's ureters were thickened, and adherent to the sidewalls, but there were able to be reimplanted without any tension. #2: The patient had a mid urethral sling mesh noted around the suprapubic bone that was unmolested.    08/27/2018 Pathology Results    Vagina, biopsy, left upper vaginal side - wall - SQUAMOUS CELL CARCINOMA.    09/15/2018 Cancer Staging     Staging form: Cervix Uteri, AJCC 7th Edition - Pathologic: FIGO Stage IB1 (T1b1, N0, cM0) - Signed by Heath Lark, MD on 09/16/2018    09/27/2018 Procedure    Successful placement of a right IJ approach Power Port with ultrasound and fluoroscopic guidance. The catheter is ready for use.    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     REVIEW OF SYSTEMS:   Constitutional: Denies fevers, chills or abnormal weight loss Eyes: Denies blurriness of vision Ears, nose, mouth, throat, and face: Denies mucositis or sore throat Respiratory: Denies cough, dyspnea or wheezes Cardiovascular: Denies palpitation, chest discomfort or lower extremity swelling Gastrointestinal:  Denies nausea, heartburn or change in bowel habits Skin: Denies abnormal skin rashes Lymphatics: Denies new lymphadenopathy or easy bruising Neurological:Denies numbness, tingling or new weaknesses Behavioral/Psych: Mood is stable, no new changes  All other systems were reviewed with the patient and are negative.  I have reviewed the past medical history, past surgical history, social history and family history with the patient and they are unchanged from previous note.  ALLERGIES:  has  No Known Allergies.  MEDICATIONS:  Current Outpatient Medications  Medication Sig Dispense Refill  . acetaminophen (TYLENOL) 500 MG tablet Take 2 tablets (1,000 mg total) by mouth every 6 (six) hours as needed for moderate pain, fever or headache. 30 tablet 0  . lidocaine-prilocaine (EMLA) cream Apply to affected area once 30 g 3  . ondansetron (ZOFRAN) 8 MG tablet Take 1 tablet (8 mg total) by mouth every 8 (eight) hours as needed. Start on  the third day after chemotherapy. 30 tablet 1  . prochlorperazine (COMPAZINE) 10 MG tablet Take 1 tablet (10 mg total) by mouth every 6 (six) hours as needed (Nausea or vomiting). 30 tablet 1   No current facility-administered medications for this visit.     PHYSICAL EXAMINATION: ECOG PERFORMANCE STATUS: 1 - Symptomatic but completely ambulatory  Vitals:   12/10/18 1021  BP: 117/72  Pulse: 82  Resp: 18  Temp: 98.5 F (36.9 C)  SpO2: 100%   Filed Weights   12/10/18 1021  Weight: 149 lb 12.8 oz (67.9 kg)    GENERAL:alert, no distress and comfortable SKIN: skin color, texture, turgor are normal, no rashes or significant lesions EYES: normal, Conjunctiva are pink and non-injected, sclera clear OROPHARYNX:no exudate, no erythema and lips, buccal mucosa, and tongue normal  NECK: supple, thyroid normal size, non-tender, without nodularity LYMPH:  no palpable lymphadenopathy in the cervical, axillary or inguinal LUNGS: clear to auscultation and percussion with normal breathing effort HEART: regular rate & rhythm and no murmurs and no lower extremity edema ABDOMEN:abdomen soft, non-tender and normal bowel sounds Musculoskeletal:no cyanosis of digits and no clubbing  NEURO: alert & oriented x 3 with fluent speech, no focal motor/sensory deficits  LABORATORY DATA:  I have reviewed the data as listed    Component Value Date/Time   NA 140 12/10/2018 0923   NA 140 05/04/2018 1657   K 4.0 12/10/2018 0923   CL 108 12/10/2018 0923   CO2 25 12/10/2018 0923   GLUCOSE 97 12/10/2018 0923   BUN 13 12/10/2018 0923   BUN 11 05/04/2018 1657   CREATININE 0.67 12/10/2018 0923   CALCIUM 8.7 (L) 12/10/2018 0923   PROT 7.3 12/10/2018 0923   PROT 7.3 05/04/2018 1657   ALBUMIN 3.8 12/10/2018 0923   ALBUMIN 4.3 05/04/2018 1657   AST 12 (L) 12/10/2018 0923   ALT 8 12/10/2018 0923   ALKPHOS 85 12/10/2018 0923   BILITOT 0.3 12/10/2018 0923   BILITOT <0.2 05/04/2018 1657   GFRNONAA >60  12/10/2018 0923   GFRAA >60 12/10/2018 0923    No results found for: SPEP, UPEP  Lab Results  Component Value Date   WBC 2.7 (L) 12/10/2018   NEUTROABS 1.6 (L) 12/10/2018   HGB 10.0 (L) 12/10/2018   HCT 30.5 (L) 12/10/2018   MCV 92.4 12/10/2018   PLT 262 12/10/2018      Chemistry      Component Value Date/Time   NA 140 12/10/2018 0923   NA 140 05/04/2018 1657   K 4.0 12/10/2018 0923   CL 108 12/10/2018 0923   CO2 25 12/10/2018 0923   BUN 13 12/10/2018 0923   BUN 11 05/04/2018 1657   CREATININE 0.67 12/10/2018 0923      Component Value Date/Time   CALCIUM 8.7 (L) 12/10/2018 0923   ALKPHOS 85 12/10/2018 0923   AST 12 (L) 12/10/2018 0923   ALT 8 12/10/2018 0923   BILITOT 0.3 12/10/2018 0923   BILITOT <0.2 05/04/2018 1657  All questions were answered. The patient knows to call the clinic with any problems, questions or concerns. No barriers to learning was detected.  I spent 15 minutes counseling the patient face to face. The total time spent in the appointment was 20 minutes and more than 50% was on counseling and review of test results  Heath Lark, MD 12/10/2018 10:44 AM

## 2018-12-13 ENCOUNTER — Telehealth: Payer: Self-pay | Admitting: Hematology and Oncology

## 2018-12-13 NOTE — Telephone Encounter (Signed)
Scheduled appt per sch msg. Mailed printout  °

## 2019-01-05 IMAGING — CT CT ABD-PEL WO/W CM
2 of 12 series · 10 of 46 positions shown, 16 images · IV contrast (ISOVUE 300)
Comparison: CT 02/16/2018.

CLINICAL DATA: Persistent right flank pain and progressive anemia
status post right percutaneous nephrostomy. New diagnosis of
cervical cancer.

EXAM:
CT ABDOMEN AND PELVIS WITHOUT AND WITH CONTRAST
TECHNIQUE: Multidetector CT imaging of the abdomen and pelvis was performed
following the standard protocol before and following the bolus
administration of intravenous contrast.
CONTRAST:  100mL [id number] IOPAMIDOL ([id number]) INJECTION 61%

[Series 4: coronal pre · coronal · non-contrast · 0.58mm/px · 2 of 97 slices shown, 3 images]
[im 33/97  soft-tissue]
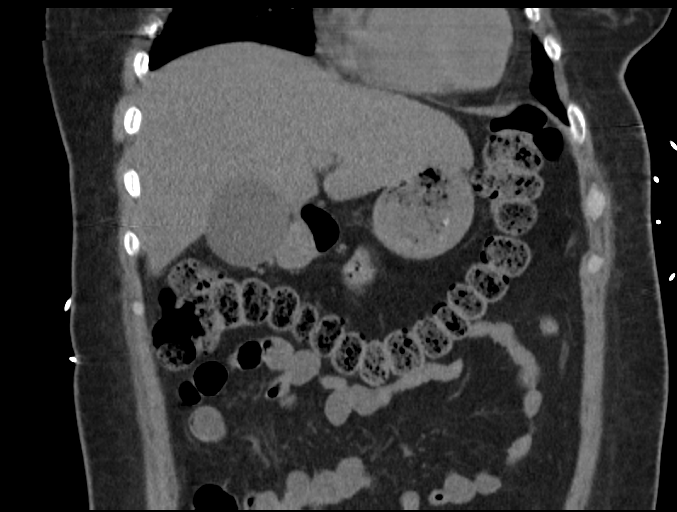
[im 33/97  bone]
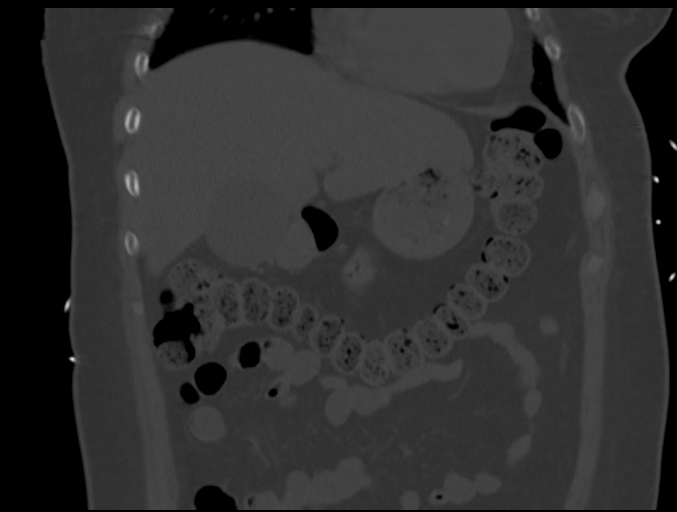
[im 65/97  soft-tissue]
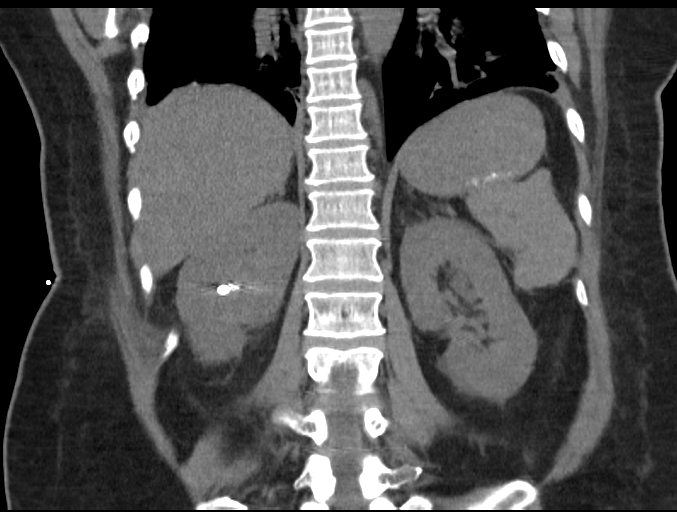

[Series 8: axial nephro · axial · 0.81mm/px · z∈[+1051,+1435]mm · 8 of 166 slices shown, 13 images]
[im 19/166  soft-tissue]
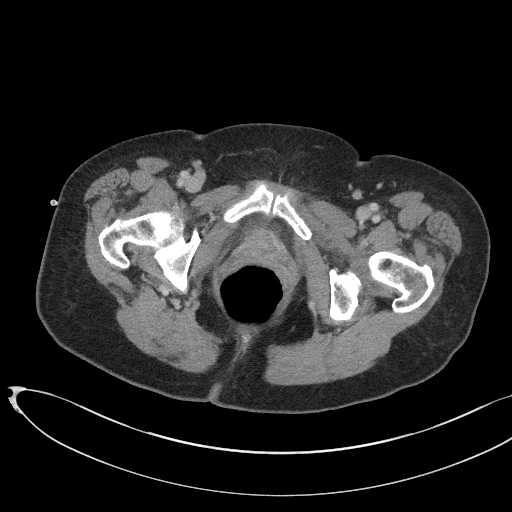
[im 19/166  bone]
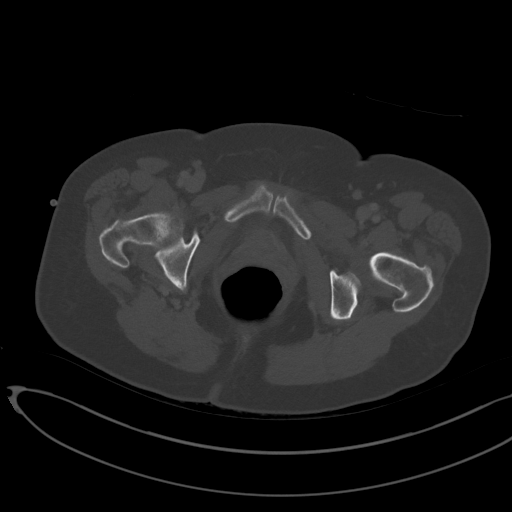
[im 37/166  soft-tissue]
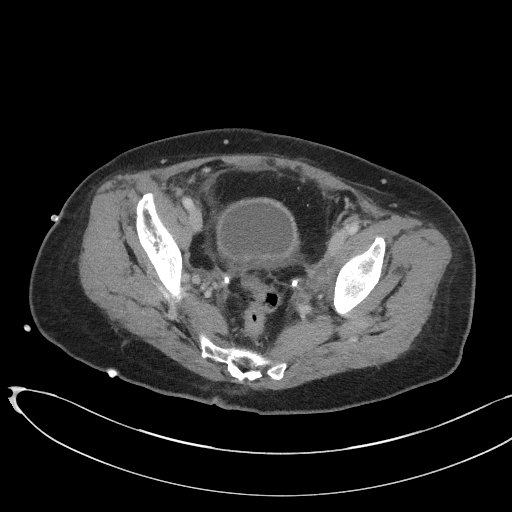
[im 56/166  soft-tissue]
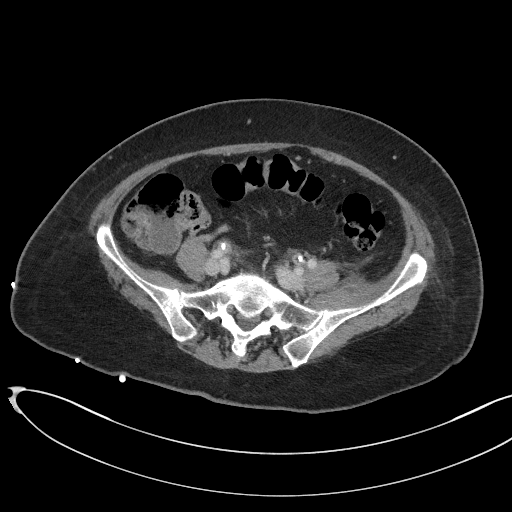
[im 74/166  soft-tissue]
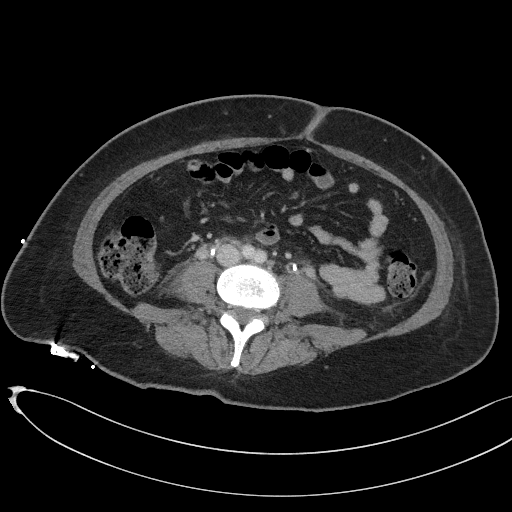
[im 92/166  soft-tissue]
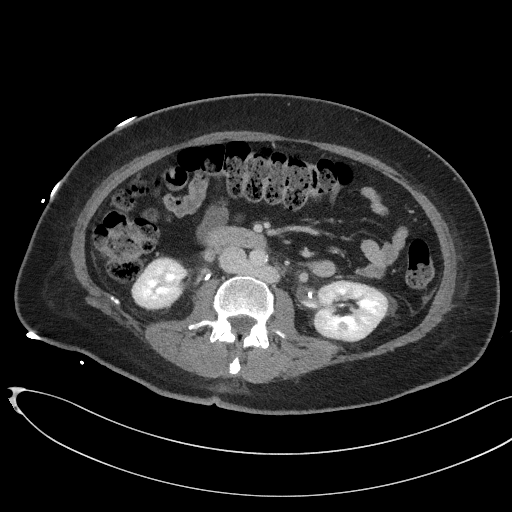
[im 92/166  lung]
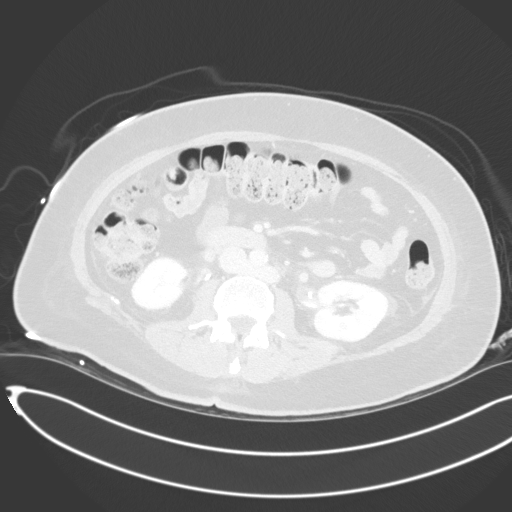
[im 111/166  soft-tissue]
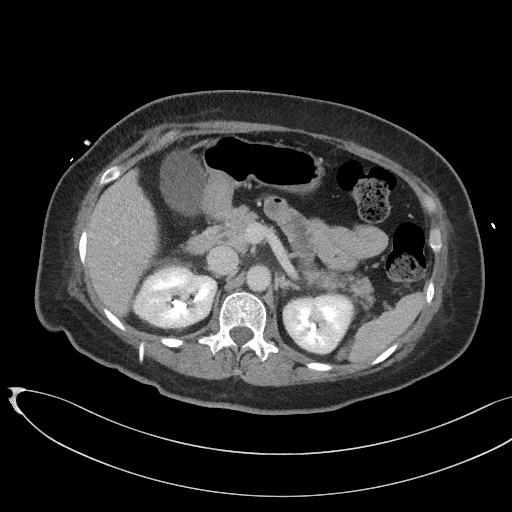
[im 111/166  lung]
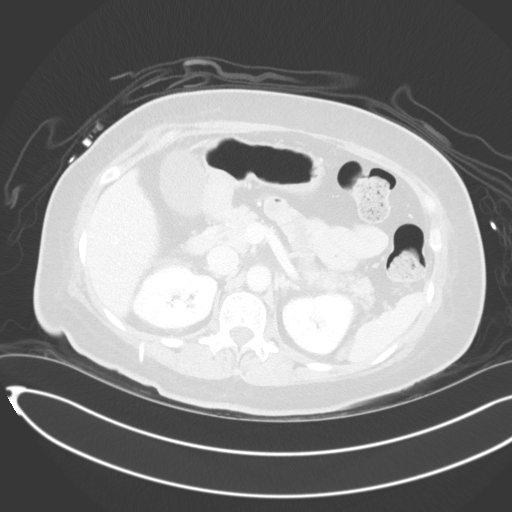
[im 129/166  soft-tissue]
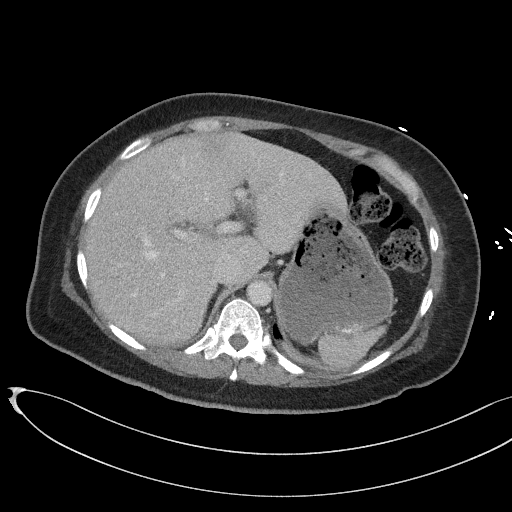
[im 129/166  lung]
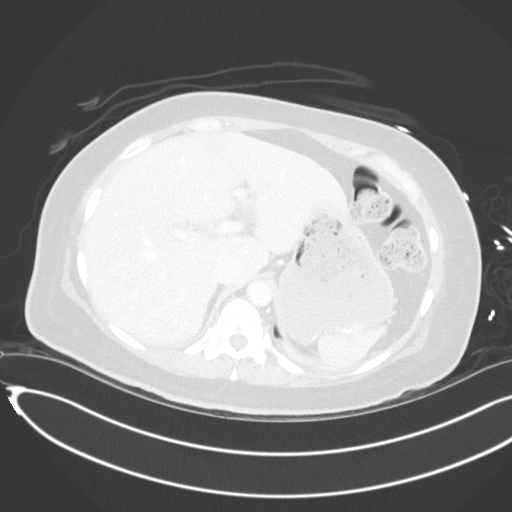
[im 147/166  soft-tissue]
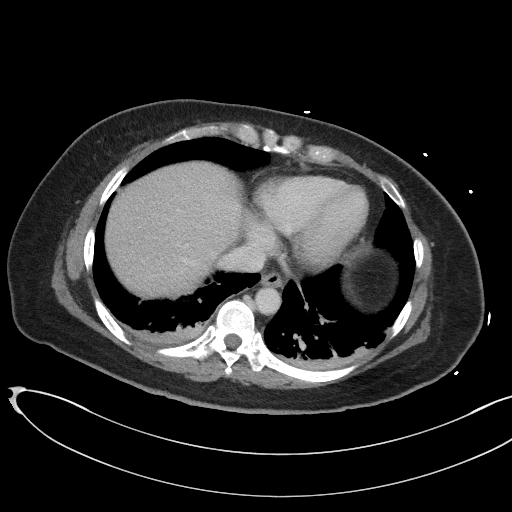
[im 147/166  lung]
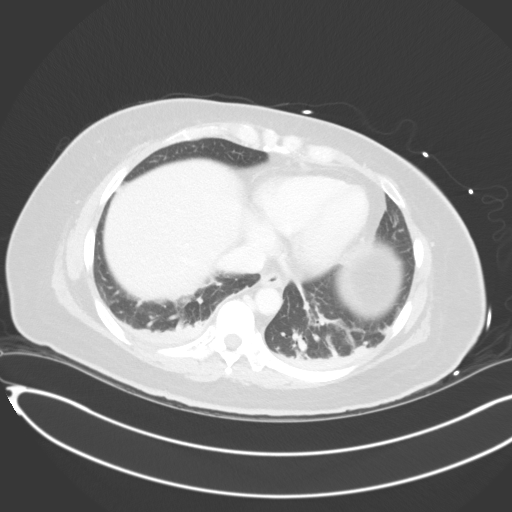

[10 of 46 positions shown; findings below may reference images not displayed]

FINDINGS: Lower chest: New trace bilateral pleural effusions with dependent
airspace opacities at both lung bases, likely atelectasis.

Hepatobiliary: Pre contrast images demonstrate decreased hepatic
density consistent with steatosis. There is stable ill-defined
low-density in segment for the liver, likely focal fat. No new or
enlarging hepatic lesions. No evidence of gallstones, gallbladder
wall thickening or biliary dilatation.

Pancreas: Unremarkable. No pancreatic ductal dilatation or
surrounding inflammatory changes.

Spleen: Normal in size without focal abnormality.

Adrenals/Urinary Tract: Both adrenal glands appear normal. The right
percutaneous nephrostomy appears well positioned. Bilateral
nephroureteral catheters are unchanged in position from recent
radiographs. There is a small amount of air within the right renal
collecting system. No evidence of urinary tract calculus. There is
no perinephric hematoma. There is mild ureteral dilatation
bilaterally. Complex fluid collection adjacent to the vaginal cuff
has decreased in size, now measuring approximately 2.6 x 8.0 cm
transverse on image 133/8. There is minimal residual air in this
collection. Delayed post-contrast images demonstrate bilateral renal
excretion, but no significant contrast in the ureters, bladder or
the pelvic fluid collection. There is mild bladder wall thickening.

Stomach/Bowel: No evidence of bowel wall thickening, distention or
surrounding inflammatory change. The appendix appears normal.

Vascular/Lymphatic: There are no enlarged abdominal or pelvic lymph
nodes. Mild aortic and branch vessel atherosclerosis. Retroaortic
left renal vein.

Reproductive: Hysterectomy. No adnexal mass. As above, the complex
pelvic fluid collection and surrounding inflammatory changes in the
pelvis have improved. No new or enlarging collections.

Other: Postsurgical changes in the anterior abdominal wall. No free
air.

Musculoskeletal: No acute or significant osseous findings. Stable
lumbar spine degenerative changes.
IMPRESSION: 1. Interval improvement in complex pelvic fluid collection and
surrounding inflammatory changes status post bilateral
nephroureteral catheter and right percutaneous nephrostomy
placement.
2. No evidence of perinephric hematoma or other signs of acute
bleeding.
3. Mild bilateral hydroureter, improved from preoperative CT. There
is bilateral excretion of contrast into the renal collecting
systems, but no significant opacification of the ureters or bladder.
4. New dependent opacities at both lung bases, likely atelectasis.

## 2019-01-06 ENCOUNTER — Ambulatory Visit
Admission: RE | Admit: 2019-01-06 | Discharge: 2019-01-06 | Disposition: A | Discharge status: Home / Self Care | Payer: BLUE CROSS/BLUE SHIELD | Source: Ambulatory Visit | Attending: Radiation Oncology | Admitting: Radiation Oncology

## 2019-01-06 ENCOUNTER — Encounter: Payer: Self-pay | Admitting: Radiation Oncology

## 2019-01-06 ENCOUNTER — Other Ambulatory Visit: Payer: Self-pay

## 2019-01-06 VITALS — BP 109/80 | HR 87 | Temp 98.1°F | Resp 18 | Ht 59.0 in | Wt 150.1 lb

## 2019-01-06 DIAGNOSIS — R3915 Urgency of urination: Secondary | ICD-10-CM | POA: Insufficient documentation

## 2019-01-06 DIAGNOSIS — C531 Malignant neoplasm of exocervix: Secondary | ICD-10-CM | POA: Insufficient documentation

## 2019-01-06 DIAGNOSIS — Z923 Personal history of irradiation: Secondary | ICD-10-CM | POA: Insufficient documentation

## 2019-01-06 DIAGNOSIS — C539 Malignant neoplasm of cervix uteri, unspecified: Secondary | ICD-10-CM

## 2019-01-06 DIAGNOSIS — Z7984 Long term (current) use of oral hypoglycemic drugs: Secondary | ICD-10-CM | POA: Insufficient documentation

## 2019-01-06 DIAGNOSIS — Z79899 Other long term (current) drug therapy: Secondary | ICD-10-CM | POA: Diagnosis not present

## 2019-01-06 NOTE — Progress Notes (Signed)
Pt in for follow up doing well Last dose 12/01/18 Denies any bleeding or discharge from the vagina. Not using dilators states she was not given any.  BP 109/80 (BP Location: Left Arm, Patient Position: Sitting)   Pulse 87   Temp 98.1 F (36.7 C) (Temporal)   Resp 18   Ht 4\' 11"  (1.499 m)   Wt 150 lb 2 oz (68.1 kg)   LMP 11/16/2012   SpO2 99%   BMI 30.32 kg/m  Wt Readings from Last 3 Encounters:  01/06/19 150 lb 2 oz (68.1 kg)  12/10/18 149 lb 12.8 oz (67.9 kg)  11/10/18 149 lb (67.6 kg)

## 2019-01-06 NOTE — Progress Notes (Signed)
  Home Care Instructions for the Insertion and Care of Your Vaginal Dilator  Why Do I Need a Vaginal Dilator?  Internal radiation therapy may cause scar tissue to form at the top of your vagina (vaginal cuff).  This may make vaginal examinations difficult in the future. You can prevent scar tissue from forming by using a vaginal dilator (a smooth plastic rod), and/or by having regular sexual intercourse.  If not using the dilator you should be having intercourse two or three times a week.  If you are unable to have intercourse, you should use your vaginal dilator.  You may have some spotting or bleeding from your dilator or intercourse the first few times. You may also have some discomfort. If discomfort occurs with intercourse, you and your partner may need to stop for a while and try again later.  How to Use Your Vaginal Dilator  - Wash the dilator with soap and water before and after each use. - Check the dilator to be sure it is smooth. Do not use the dilator if you find any roughspots. - Coat the dilator with K-Y Jelly, Astroglide, or Replens. Do not use Vaseline, baby oil, or other oil based lubricants. They are not water-soluble and can be irritating to the tissues in the vagina. - Lie on your back with your knees bent and legs apart. - Insert the rounded end of the dilator into your vagina as far as it will go without causing pain or discomfort. - Close your knees and slowly straighten your legs. - Keep the dilator in your vagina for about 10 to 15 minutes.  Please use 3 times a week, for example: Monday, Wednesday and Friday evenings. [facility] your knees, open your legs, and gently remove the dilator. - Gently cleanse the skin around the vaginal opening. - Wash the dilator after each use. -  It is important that you use the dilator routinely until instructed otherwise by your doctor.   Loma Sousa, RN BSN

## 2019-01-06 NOTE — Patient Instructions (Signed)
Coronavirus (COVID-19) Are you at risk?  Are you at risk for the Coronavirus (COVID-19)?  To be considered HIGH RISK for Coronavirus (COVID-19), you have to meet the following criteria:  . Traveled to [place], [place], [place], [place] or [place]; or in the [place] to [place], [place], [place], or [place]; and have fever, cough, and shortness of breath within the last 2 weeks of travel OR . Been in close contact with a person diagnosed with COVID-19 within the last 2 weeks and have fever, cough, and shortness of breath . IF YOU DO NOT MEET THESE CRITERIA, YOU ARE CONSIDERED LOW RISK FOR COVID-19.  What to do if you are HIGH RISK for COVID-19?  . If you are having a medical emergency, call 911. . Seek medical care right away. Before you go to a doctor's office, urgent care or emergency department, call ahead and tell them about your recent travel, contact with someone diagnosed with COVID-19, and your symptoms. You should receive instructions from your physician's office regarding next steps of care.  . When you arrive at healthcare provider, tell the healthcare staff immediately you have returned from visiting [place], [place], [place], [place] or [place]; or traveled in the [place] to [place], [place], [place], or [place]; in the last two weeks or you have been in close contact with a person diagnosed with COVID-19 in the last 2 weeks.   . Tell the health care staff about your symptoms: fever, cough and shortness of breath. . After you have been seen by a medical provider, you will be either: o Tested for (COVID-19) and discharged home on quarantine except to seek medical care if symptoms worsen, and asked to  - Stay home and avoid contact with others until you get your results (4-5 days)  - Avoid travel on public transportation if possible (such as bus, train, or airplane) or o Sent to the Emergency Department by EMS for evaluation, COVID-19 testing, and possible  admission depending on your condition and test results.  What to do if you are LOW RISK for COVID-19?  Reduce your risk of any infection by using the same precautions used for avoiding the common cold or flu:  . Wash your hands often with soap and warm water for at least 20 seconds.  If soap and water are not readily available, use an alcohol-based hand sanitizer with at least 60% alcohol.  . If coughing or sneezing, cover your mouth and nose by coughing or sneezing into the elbow areas of your shirt or coat, into a tissue or into your sleeve (not your hands). . Avoid shaking hands with others and consider head nods or verbal greetings only. . Avoid touching your eyes, nose, or mouth with unwashed hands.  . Avoid close contact with people who are sick. . Avoid places or events with large numbers of people in one location, like concerts or sporting events. . Carefully consider travel plans you have or are making. . If you are planning any travel outside or inside the [place], visit the CDC's Travelers' Health webpage for the latest health notices. . If you have some symptoms but not all symptoms, continue to monitor at home and seek medical attention if your symptoms worsen. . If you are having a medical emergency, call 911.   ADDITIONAL HEALTHCARE OPTIONS FOR PATIENTS  [facility] / e-Visit: [email or web address]         [facility]: [phone]  [facility]: [phone]                   [facility]: [phone]   

## 2019-01-06 NOTE — Progress Notes (Signed)
Radiation Oncology         [phone] ________________________________  Name: Amy Morrow MRN: [id number]  Date: 01/06/2019  DOB: [birth year]  Follow-Up Visit Note  CC: Antony Blackbird, MD  [name], MD    ICD-10-CM   1. Recurrent cervical cancer (HCC) C53.9     Diagnosis:   58 y.o. female with recurrent cervical cancer  Interval Since Last Radiation:  5 weeks   Radiation treatment dates:     1. IMRT: 09/27/2018 - 11/01/2018 2. HDR: 11/10/2018, 11/18/2018, 11/24/2018, 12/01/2018 Site/dose:    1. Pelvis, Vagina / 45 Gy in 25 fractions 2. Vaginal Cuff (area of recurrence) / 24 Gy in 4 fractions  Narrative:  The patient returns today for routine follow-up.  Communication was with the assistance of the hospital interpretor. The patient is doing well overall. She reports some urinary urgency. She denies any vaginal bleeding or discharge. She is not using vaginal dilators and states that she was not given any yet..                          ALLERGIES:  has No Known Allergies.  Meds: Current Outpatient Medications  Medication Sig Dispense Refill   acetaminophen (TYLENOL) 500 MG tablet Take 2 tablets (1,000 mg total) by mouth every 6 (six) hours as needed for moderate pain, fever or headache. 30 tablet 0   metFORMIN (GLUCOPHAGE-XR) 500 MG 24 hr tablet Take by mouth.     solifenacin (VESICARE) 10 MG tablet Take by mouth.     tamsulosin (FLOMAX) 0.4 MG CAPS capsule Take by mouth.     lidocaine-prilocaine (EMLA) cream Apply to affected area once (Patient not taking: Reported on 01/06/2019) 30 g 3   ondansetron (ZOFRAN) 8 MG tablet Take 1 tablet (8 mg total) by mouth every 8 (eight) hours as needed. Start on the third day after chemotherapy. (Patient not taking: Reported on 01/06/2019) 30 tablet 1   prochlorperazine (COMPAZINE) 10 MG tablet Take 1 tablet (10 mg total) by mouth every 6 (six) hours as needed (Nausea or vomiting). (Patient not taking: Reported on 01/06/2019) 30 tablet  1   No current facility-administered medications for this encounter.     Physical Findings: The patient is in no acute distress. Patient is alert and oriented.  height is 4\' 11"  (1.499 m) and weight is 150 lb 2 oz (68.1 kg). Her temporal temperature is 98.1 F (36.7 C). Her blood pressure is 109/80 and her pulse is 87. Her respiration is 18 and oxygen saturation is 99%.   Lungs are clear to auscultation bilaterally. Heart has regular rate and rhythm. No palpable cervical, supraclavicular, or axillary adenopathy. Abdomen soft, non-tender, normal bowel sounds.  Pelvic exam not performed in light of her recent completion of treatment.  Lab Findings: Lab Results  Component Value Date   WBC 2.7 (L) 12/10/2018   HGB 10.0 (L) 12/10/2018   HCT 30.5 (L) 12/10/2018   MCV 92.4 12/10/2018   PLT 262 12/10/2018    Radiographic Findings: No results found.  Impression:  Recurrent cervical cancer. The patient is recovering from the effects of radiation.  Clinically stable status post external beam and vaginal brachytherapy. Patient reports some urinary urgency but otherwise seems to be doing well at this time.   Plan:  Patient will follow up with Dr. Denman George in 2 months and radiation oncology in 5 months. She will also follow up with Dr. Alvy Bimler in July with scans planned at  that time. Today the patient was given a vaginal dilator and instructions on its use.  ____________________________________  Blair Promise, PhD, MD  This document serves as a record of services personally performed by Gery Pray, MD. It was created on his behalf by Rae Lips, a trained medical scribe. The creation of this record is based on the scribe's personal observations and the provider's statements to them. This document has been checked and approved by the attending provider.

## 2019-01-28 ENCOUNTER — Other Ambulatory Visit: Payer: Self-pay

## 2019-01-28 ENCOUNTER — Inpatient Hospital Stay: Payer: BLUE CROSS/BLUE SHIELD

## 2019-01-28 ENCOUNTER — Inpatient Hospital Stay: Payer: BLUE CROSS/BLUE SHIELD | Attending: Gynecologic Oncology

## 2019-01-28 DIAGNOSIS — D61818 Other pancytopenia: Secondary | ICD-10-CM | POA: Insufficient documentation

## 2019-01-28 DIAGNOSIS — C73 Malignant neoplasm of thyroid gland: Secondary | ICD-10-CM | POA: Insufficient documentation

## 2019-01-28 DIAGNOSIS — C539 Malignant neoplasm of cervix uteri, unspecified: Secondary | ICD-10-CM | POA: Diagnosis present

## 2019-01-28 DIAGNOSIS — C531 Malignant neoplasm of exocervix: Secondary | ICD-10-CM

## 2019-01-28 LAB — CBC WITH DIFFERENTIAL/PLATELET
Abs Immature Granulocytes: 0 10*3/uL (ref 0.00–0.07)
Basophils Absolute: 0 10*3/uL (ref 0.0–0.1)
Basophils Relative: 0 %
Eosinophils Absolute: 0.1 10*3/uL (ref 0.0–0.5)
Eosinophils Relative: 4 %
HCT: 30.5 % — ABNORMAL LOW (ref 36.0–46.0)
Hemoglobin: 10.3 g/dL — ABNORMAL LOW (ref 12.0–15.0)
Immature Granulocytes: 0 %
Lymphocytes Relative: 21 %
Lymphs Abs: 0.6 10*3/uL — ABNORMAL LOW (ref 0.7–4.0)
MCH: 32 pg (ref 26.0–34.0)
MCHC: 33.8 g/dL (ref 30.0–36.0)
MCV: 94.7 fL (ref 80.0–100.0)
Monocytes Absolute: 0.3 10*3/uL (ref 0.1–1.0)
Monocytes Relative: 10 %
Neutro Abs: 1.8 10*3/uL (ref 1.7–7.7)
Neutrophils Relative %: 65 %
Platelets: 228 10*3/uL (ref 150–400)
RBC: 3.22 MIL/uL — ABNORMAL LOW (ref 3.87–5.11)
RDW: 11.9 % (ref 11.5–15.5)
WBC: 2.8 10*3/uL — ABNORMAL LOW (ref 4.0–10.5)
nRBC: 0 % (ref 0.0–0.2)

## 2019-01-28 LAB — COMPREHENSIVE METABOLIC PANEL
ALT: 9 U/L (ref 0–44)
AST: 16 U/L (ref 15–41)
Albumin: 4.1 g/dL (ref 3.5–5.0)
Alkaline Phosphatase: 79 U/L (ref 38–126)
Anion gap: 9 (ref 5–15)
BUN: 14 mg/dL (ref 6–20)
CO2: 25 mmol/L (ref 22–32)
Calcium: 9.2 mg/dL (ref 8.9–10.3)
Chloride: 106 mmol/L (ref 98–111)
Creatinine, Ser: 0.75 mg/dL (ref 0.44–1.00)
GFR calc Af Amer: >60 mL/min (ref >60)
GFR calc non Af Amer: >60 mL/min (ref >60)
Glucose, Bld: 92 mg/dL (ref 70–99)
Potassium: 3.8 mmol/L (ref 3.5–5.1)
Sodium: 140 mmol/L (ref 135–145)
Total Bilirubin: 0.7 mg/dL (ref 0.3–1.2)
Total Protein: 7.5 g/dL (ref 6.5–8.1)

## 2019-01-28 MED ORDER — HEPARIN SOD (PORK) LOCK FLUSH 100 UNIT/ML IV SOLN
250.0000 [IU] | Freq: Once | INTRAVENOUS | Status: AC
  Administered 2019-01-28: 500 [IU]
  Filled 2019-01-28: qty 5

## 2019-01-28 MED ORDER — SODIUM CHLORIDE 0.9% FLUSH
10.0000 mL | Freq: Once | INTRAVENOUS | Status: AC
  Administered 2019-01-28: 10 mL
  Filled 2019-01-28: qty 10

## 2019-01-28 NOTE — Patient Instructions (Signed)

## 2019-02-09 MED FILL — SOLIFENACIN SUCCINATE 10 MG: 10 | 30 days supply | Qty: 30 | Fill #0

## 2019-03-02 ENCOUNTER — Inpatient Hospital Stay: Payer: BLUE CROSS/BLUE SHIELD

## 2019-03-02 ENCOUNTER — Other Ambulatory Visit: Payer: Self-pay

## 2019-03-02 ENCOUNTER — Ambulatory Visit (HOSPITAL_COMMUNITY)
Admission: RE | Admit: 2019-03-02 | Discharge: 2019-03-02 | Disposition: A | Discharge status: Home / Self Care | Payer: BLUE CROSS/BLUE SHIELD | Source: Ambulatory Visit | Attending: Hematology and Oncology | Admitting: Hematology and Oncology

## 2019-03-02 ENCOUNTER — Inpatient Hospital Stay: Payer: BLUE CROSS/BLUE SHIELD | Attending: Gynecologic Oncology

## 2019-03-02 DIAGNOSIS — D61818 Other pancytopenia: Secondary | ICD-10-CM | POA: Insufficient documentation

## 2019-03-02 DIAGNOSIS — C531 Malignant neoplasm of exocervix: Secondary | ICD-10-CM

## 2019-03-02 DIAGNOSIS — I700 Atherosclerosis of aorta: Secondary | ICD-10-CM | POA: Insufficient documentation

## 2019-03-02 DIAGNOSIS — C539 Malignant neoplasm of cervix uteri, unspecified: Secondary | ICD-10-CM | POA: Insufficient documentation

## 2019-03-02 DIAGNOSIS — C73 Malignant neoplasm of thyroid gland: Secondary | ICD-10-CM | POA: Diagnosis not present

## 2019-03-02 DIAGNOSIS — R918 Other nonspecific abnormal finding of lung field: Secondary | ICD-10-CM | POA: Diagnosis not present

## 2019-03-02 DIAGNOSIS — Z79899 Other long term (current) drug therapy: Secondary | ICD-10-CM | POA: Insufficient documentation

## 2019-03-02 DIAGNOSIS — R87820 Cervical low risk human papillomavirus (HPV) DNA test positive: Secondary | ICD-10-CM | POA: Insufficient documentation

## 2019-03-02 DIAGNOSIS — Z7984 Long term (current) use of oral hypoglycemic drugs: Secondary | ICD-10-CM | POA: Diagnosis not present

## 2019-03-02 LAB — COMPREHENSIVE METABOLIC PANEL
ALT: 9 U/L (ref 0–44)
AST: 16 U/L (ref 15–41)
Albumin: 4.1 g/dL (ref 3.5–5.0)
Alkaline Phosphatase: 81 U/L (ref 38–126)
Anion gap: 8 (ref 5–15)
BUN: 11 mg/dL (ref 6–20)
CO2: 27 mmol/L (ref 22–32)
Calcium: 9.3 mg/dL (ref 8.9–10.3)
Chloride: 105 mmol/L (ref 98–111)
Creatinine, Ser: 0.76 mg/dL (ref 0.44–1.00)
GFR calc Af Amer: >60 mL/min (ref >60)
GFR calc non Af Amer: >60 mL/min (ref >60)
Glucose, Bld: 97 mg/dL (ref 70–99)
Potassium: 4 mmol/L (ref 3.5–5.1)
Sodium: 140 mmol/L (ref 135–145)
Total Bilirubin: 0.4 mg/dL (ref 0.3–1.2)
Total Protein: 7.6 g/dL (ref 6.5–8.1)

## 2019-03-02 LAB — CBC WITH DIFFERENTIAL/PLATELET
Abs Immature Granulocytes: 0.01 10*3/uL (ref 0.00–0.07)
Basophils Absolute: 0 10*3/uL (ref 0.0–0.1)
Basophils Relative: 1 %
Eosinophils Absolute: 0.1 10*3/uL (ref 0.0–0.5)
Eosinophils Relative: 5 %
HCT: 33.3 % — ABNORMAL LOW (ref 36.0–46.0)
Hemoglobin: 11.4 g/dL — ABNORMAL LOW (ref 12.0–15.0)
Immature Granulocytes: 0 %
Lymphocytes Relative: 26 %
Lymphs Abs: 0.8 10*3/uL (ref 0.7–4.0)
MCH: 31.3 pg (ref 26.0–34.0)
MCHC: 34.2 g/dL (ref 30.0–36.0)
MCV: 91.5 fL (ref 80.0–100.0)
Monocytes Absolute: 0.3 10*3/uL (ref 0.1–1.0)
Monocytes Relative: 10 %
Neutro Abs: 1.7 10*3/uL (ref 1.7–7.7)
Neutrophils Relative %: 58 %
Platelets: 224 10*3/uL (ref 150–400)
RBC: 3.64 MIL/uL — ABNORMAL LOW (ref 3.87–5.11)
RDW: 11.7 % (ref 11.5–15.5)
WBC: 2.9 10*3/uL — ABNORMAL LOW (ref 4.0–10.5)
nRBC: 0 % (ref 0.0–0.2)

## 2019-03-02 LAB — GLUCOSE, CAPILLARY: Glucose-Capillary: 97 mg/dL (ref 70–99)

## 2019-03-02 MED ORDER — FLUDEOXYGLUCOSE F - 18 (FDG) INJECTION
7.4100 | Freq: Once | INTRAVENOUS | Status: AC | PRN
  Administered 2019-03-02: 10:00:00 7.41 via INTRAVENOUS

## 2019-03-02 NOTE — Patient Instructions (Signed)
Gua de cuidados en el hogar del dispositivo de perfusin implantable Implanted [place] Guide Un dispositivo de perfusin implantable es un dispositivo que se coloca debajo de la piel. Por lo general, se coloca en el pecho. Puede usarse para suministrar medicamentos intravenosos, tomar muestras de sangre o realizar dilisis. Puede tener un dispositivo implantable si:  Necesita administrarse un medicamento intravenoso que podra provocar una irritacin en las venas pequeas de las manos o los brazos.  Necesita medicamentos por va intravenosa a largo plazo, como antibiticos.  Necesita recibir nutricin por va intravenosa durante un largo perodo.  Debe someterse a dilisis. Si tiene un dispositivo, su mdico no tendr que recurrir a las venas de sus brazos para estos procedimientos. Puede tener algunas limitaciones ms al usar el dispositivo que si usara otro tipo de vas intravenosas a largo plazo, y es probable que pueda retomar sus actividades normales cuando cure la incisin. Un dispositivo de perfusin implantable tiene dos partes principales:  Depsito. El depsito es la parte en donde se inserta la aguja para administrar los medicamentos o extraer sangre. El depsito es redondo. Luego de colocado, se ve como un rea pequea y elevada debajo de su piel.  Catter. El catter es un tubo delgado y flexible que conecta el depsito a una vena. Los medicamentos que se introducen en el depsito, pasan a travs del catter y luego llegan a la vena. Cmo se accede al dispositivo de perfusin? Para acceder al dispositivo:  Puede que se le coloque crema anestsica sobre la piel encima del dispositivo.  El mdico se colocar una mscara y guantes estriles.  La piel sobre el dispositivo se limpiar con cuidado con un jabn antisptico y se dejar secar.  Su mdico pellizcar suavemente el dispositivo e insertar una aguja dentro.  Su mdico revisar el retorno de sangre para garantizar que  el dispositivo est en la vena y no est obstruido.  Si el dispositivo debe tener accesibilidad para un suministro continuo de medicamentos (infusin constante), su mdico colocar un vendaje (venda) claro sobre el lugar donde se coloc la aguja. El vendaje y la aguja debern cambiarse todas las semanas o segn las indicaciones del mdico. Qu es el purgado? El purgado ayuda a que el dispositivo no se obstruya. Siga las indicaciones del mdico acerca de cmo y cundo purgar el dispositivo. Los dispositivos de perfusin generalmente se purgan con una solucin salina o un medicamento llamado heparina. La necesidad de purgar depender de cmo se use el dispositivo:  Si se usa solo eventualmente para suministrar medicamentos o extraer sangre, se debe purgar el dispositivo: ? Antes y despus de la administracin de los medicamentos. ? Antes y despus de una extraccin de sangre. ? Como parte de una rutina de mantenimiento. Es recomendable que se purgue cada 4 o 6 semanas.  Si la infusin es constante, no necesitar limpiarlo.  Deseche las agujas y las jeringas en un contenedor para desechos destinado a objetos punzantes(recipiente para objetos punzantes). Puede comprar un recipiente para objetos punzantes en una farmacia, o puede hacer uno usted mismo con una botella de plstico rgida y un cobertor. Durante cunto tiempo tendr implantado el dispositivo? El dispositivo puede permanecer implantado por el tiempo que el mdico considere necesario. Cuando llega el momento de retirar el dispositivo, deber someterse a una ciruga. Esta ciruga ser similar a la realizada en el momento de la colocacin del dispositivo. Siga estas indicaciones en su casa:   Purgue el dispositivo como se lo haya indicado el   mdico.  Si debe recibir una infusin [place] indicaciones del mdico acerca de cmo cuidar el lugar donde tiene colocado el dispositivo. Haga lo siguiente: ? Lvese las manos  con agua y [place] antes de cambiar el apsito. Use desinfectante para manos con alcohol si no dispone de [place] y [place]. ? Cambie el vendaje como se lo haya indicado el mdico. ? Coloque vendas usadas o bolsas de infusin en una bolsa plstica. Tire esa bolsa en el contenedor de basura. ? Mantenga el vendaje que cubre la aguja limpio y seco. No deje que se moje. ? No use tijeras u objetos punzantes cerca del tubo. ? Mantenga el tubo sujetado, excepto cuando se use.  Controle TEFL teacher donde tiene colocado el dispositivo todos los das para detectar signos de infeccin. Est atento a los siguientes signos: ? Dolor, hinchazn o enrojecimiento. ? Lquido o sangre. ? Pus o mal olor.  Proteja la piel alrededor del lugar donde tiene colocado el dispositivo. ? Evite usar sostenes si los breteles rozan o Loss adjuster, chartered. ? Proteja la piel alrededor del dispositivo cuando se coloque el cinturn de seguridad. Coloque una almohadilla suave en el pecho, de ser necesario.  Dchese o bese como se lo haya indicado el mdico. Se puede mojar el lugar siempre y cuando no est recibiendo activamente una infusin.  Retome sus actividades normales como se lo haya indicado el mdico. Pregntele al mdico qu actividades son seguras para usted.  Utilice un brazalete o lleve consigo una tarjeta de alerta mdica en todo momento. Esto les permitir a los mdicos saber que tiene un dispositivo implantable en caso de Engineer, maintenance (IT). Solicite ayuda de inmediato si:  Tiene enrojecimiento, hinchazn o Management consultant donde tiene el dispositivo.  Le sale una mayor cantidad de lquido o de sangre del lugar donde tiene el dispositivo.  Tiene pus o percibe mal olor que proviene del lugar donde tiene colocado el dispositivo.  Tiene fiebre. Resumen  Por lo general, los dispositivos implantados se colocan en el pecho para un acceso intravenoso a Barrister's clerk.  Siga las indicaciones del mdico sobre cmo purgar el  dispositivo y [place] vendas (vendajes).  Cuide el rea alrededor de donde tiene colocado el dispositivo, evite la ropa que presione el rea, y preste atencin a los signos de infeccin.  Proteja la piel alrededor del dispositivo cuando se coloque el cinturn de seguridad. Coloque una almohadilla suave en el pecho, de ser necesario.  Busque ayuda de inmediato si tiene fiebre o enrojecimiento, hinchazn, dolor, drenaje o mal Editor, commissioning donde tiene el dispositivo. Esta informacin no tiene Marine scientist el consejo del mdico. Asegrese de hacerle al mdico cualquier pregunta que tenga. Document Released: 05/18/2009 Document Revised: 04/23/2017 Document Reviewed: 04/23/2017 Elsevier Patient Education  2020 [name].

## 2019-03-03 ENCOUNTER — Other Ambulatory Visit: Payer: Self-pay | Admitting: Hematology and Oncology

## 2019-03-03 ENCOUNTER — Encounter: Payer: Self-pay | Admitting: Hematology and Oncology

## 2019-03-03 ENCOUNTER — Other Ambulatory Visit: Payer: Self-pay

## 2019-03-03 ENCOUNTER — Inpatient Hospital Stay (HOSPITAL_BASED_OUTPATIENT_CLINIC_OR_DEPARTMENT_OTHER): Payer: BLUE CROSS/BLUE SHIELD | Admitting: Hematology and Oncology

## 2019-03-03 ENCOUNTER — Other Ambulatory Visit: Payer: Self-pay | Admitting: Family Medicine

## 2019-03-03 VITALS — BP 123/72 | HR 96 | Temp 98.9°F | Resp 18 | Ht 59.0 in | Wt 148.0 lb

## 2019-03-03 DIAGNOSIS — C73 Malignant neoplasm of thyroid gland: Secondary | ICD-10-CM | POA: Diagnosis not present

## 2019-03-03 DIAGNOSIS — Z79899 Other long term (current) drug therapy: Secondary | ICD-10-CM

## 2019-03-03 DIAGNOSIS — R87820 Cervical low risk human papillomavirus (HPV) DNA test positive: Secondary | ICD-10-CM | POA: Diagnosis not present

## 2019-03-03 DIAGNOSIS — Z7984 Long term (current) use of oral hypoglycemic drugs: Secondary | ICD-10-CM

## 2019-03-03 DIAGNOSIS — I700 Atherosclerosis of aorta: Secondary | ICD-10-CM

## 2019-03-03 DIAGNOSIS — D61818 Other pancytopenia: Secondary | ICD-10-CM | POA: Diagnosis not present

## 2019-03-03 DIAGNOSIS — C539 Malignant neoplasm of cervix uteri, unspecified: Secondary | ICD-10-CM | POA: Diagnosis not present

## 2019-03-03 DIAGNOSIS — R918 Other nonspecific abnormal finding of lung field: Secondary | ICD-10-CM

## 2019-03-03 NOTE — Assessment & Plan Note (Signed)
I have reviewed multiple imaging studies with the patient The patient has complete response to therapy I will get her port removed We will arrange for future follow-up with GYN oncologist and radiation oncologist

## 2019-03-03 NOTE — Progress Notes (Signed)
Patient ID: EARLY ORD, female   DOB: 1961/06/20, 58 y.o.   MRN: [id number]   Patient with thyroid cancer and her oncologist whom she sees for her cervical cancer has recommended that she be seen by ENT or general surgery.

## 2019-03-03 NOTE — Assessment & Plan Note (Signed)
She has mild persistent pancytopenia but overall improving I recommend follow-up with primary care doctor with repeat CBC in the future

## 2019-03-03 NOTE — Progress Notes (Signed)
[place] OFFICE PROGRESS NOTE  Patient Care Team: Antony Blackbird, MD as PCP - General (Family Medicine) [name], RN as Oncology Nurse Navigator (Oncology)  ASSESSMENT & PLAN:  Recurrent cervical cancer [facility]) I have reviewed multiple imaging studies with the patient The patient has complete response to therapy I will get her port removed We will arrange for future follow-up with GYN oncologist and radiation oncologist  Primary thyroid cancer [facility]) From our prior discussion, I recommend referral to ENT or general surgery for thyroidectomy The patient has not made contact with her primary care doctor but has an appointment coming up next month for further discussion This is a surgical disease; she can be potentially cured with surgery alone I would defer to her primary care doctor for further management  Pancytopenia, acquired [facility]) She has mild persistent pancytopenia but overall improving I recommend follow-up with primary care doctor with repeat CBC in the future   Orders Placed This Encounter  Procedures  . IR REMOVAL TUN ACCESS W/ PORT W/O FL MOD SED    Standing Status:   Future    Standing Expiration Date:   05/03/2020    Order Specific Question:   Reason for Exam (SYMPTOM  OR DIAGNOSIS REQUIRED)    Answer:   no need chemo anymore    Order Specific Question:   Is the patient pregnant?    Answer:   No    Order Specific Question:   Preferred Imaging Location?    Answer:   [facility]    Order Specific Question:   Call Results- Best Contact Number?    Answer:   needs spanish interpreter    INTERVAL HISTORY: Please see below for problem oriented charting. She returns for further follow-up Spanish interpreter is present She have no new symptoms She has not made contact with her primary care doctor regarding thyroid surgery referral She denies recent infection, fever or chills Denies abnormal vaginal discharge  SUMMARY OF ONCOLOGIC  HISTORY: Oncology History Overview Note  Recurrent cervical cancer to the vagina HPV positive from 2019 specimen   Recurrent cervical cancer ([place])  06/11/2011 Pathology Results   1. Endocervix, curettage DETACHED FRAGMENTS OF SQUAMOUS MUCOSA WITH KOILOCYTIC ATYPIA, SEE COMMENT. 2. Cervix, biopsy LOW GRADE SQUAMOUS INTRAEPITHELIAL LESION, CIN-I (MILD DYSPLASIA), SEE COMMENT. Microscopic Comment 1. 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 2. The findings   01/09/2012 Pathology Results   LOW GRADE SQUAMOUS INTRAEPITHELIAL LESION: CIN-1/ HPV (LSIL). ENDOMETRIAL CELLS ARE PRESENT.   01/12/2018 Pathology Results   Cervix, biopsy - INVASIVE SQUAMOUS CELL CARCINOMA - SEE COMMENT   02/08/2018 Pathology Results   1. Lymph nodes, regional resection, right pelvic - EIGHT LYMPH NODES, NEGATIVE FOR CARCINOMA (0/8). 2. Lymph nodes, regional resection, left pelvic - TWELVE LYMPH NODES, NEGATIVE FOR CARCINOMA (0/12). 3. Uterus +/- tubes/ovaries, neoplastic, cervix, bilateral tubes and ovaries, upper third of vagina - INVASIVE MODERATELY DIFFERENTIATED SQUAMOUS CELL CARCINOMA, 2.6 CM, INVOLVING ANTERIOR PORTION OF CERVIX FROM 9 O'CLOCK TO 3 O'CLOCK. - TUMOR INVADES FOR DEPTH OF 0.5 CM AND IS CONFINED TO THE CERVIX. - ALL RESECTION MARGINS ARE NEGATIVE FOR CARCINOMA; THE CLOSEST IS THE VAGINAL CUFF MARGIN AT 1 CM. - NEGATIVE FOR LYMPHOVASCULAR OR PERINEURAL INVASION. - BENIGN ENDOMETRIAL POLYP, 2.6 CM. - BENIGN LEIOMYOMA, 2.2 CM - BENIGN INACTIVE ENDOMETRIUM. - BENIGN BILATERAL OVARIES AND FALLOPIAN TUBES. - SEE ONCOLOGY TABLE. Microscopic Comment 3. UTERINE CERVIX: Resection Procedure: Radial hysterectomy. Tumor Size: 2.6 cm. Histologic Type: Squamous  cell carcinoma. Histologic Grade: G2: Moderately differentiated. Stromal Invasion: Depth of stromal invasion (millimeters): 5 mm. Horizontal extent longitudinal/length (if applicable#) (millimeters): 20  mm. Horizontal extent circumferential/width (if applicable#) (millimeters): 26 mm. Other Tissue/ Organ: Not involved. Margins: Negative for carcinoma. Lymphovascular Invasion: Not identified. Regional Lymph Nodes: All lymph nodes negative for tumor cells Total Number of Lymph Nodes Examined: 20. Number of Sentinel Nodes Examined (if applicable): 0. Pathologic Stage Classification (pTNM, AJCC 8th Edition): pT1b1, pN0. Ancillary Studies: Not applicable. Representative Tumor Block: 3E, 45F, and 3G.   02/08/2018 Surgery   Operation: Robotic-assisted type III radical laparoscopic hysterectomy with bilateral salpingoophorectomy and bilateral pelvic lymphadenectomy  Surgeon: Donaciano Eva  Operative Findings:  : 2-3cm pedunculated exophytic mass from right anterior cervix. No clinical involvement of the parametria, no suspicious nodes   02/16/2018 Surgery   Procedure: 1. Cystoscopy, bilateral retrograde pyelograms with interpretation 2. Diagnostic ureteroscopy 3. Bilateral ureteral stent placement  Surgeon: Ardis Hughs, MD  Intraoperative findings:  #1: The retrograde pyelogram on the patient's right side initially demonstrated significant contrast extravasation several centimeters from the ureteral orifice.  There was proximal hydroureteronephrosis. #2: Ureteroscopy of the right ureter demonstrated a full-thickness ureteral injury likely thermal in nature approximately 2-1/2 cm from the UVJ. #3: The retrograde on the patient's left side demonstrated severe extravasation with no contrast getting beyond the first centimeter of the ureter.  Once beyond this area with a ureteroscope the ureter was mild to moderately dilated. 4.:  Ureteroscopy of the left ureter demonstrated an area approximately 2 cm from the UVJ and lasting approximately a centimeter and a half of devitalized tissue, full-thickness, likely thermal in nature. #5: 5 French x22 cm Polaris stents were placed  bilaterally.    03/22/2018 Imaging   Highly suspicious nodule in the inferior right thyroid lobe corresponds with the hypermetabolic activity on the PET-CT. Recommend ultrasound-guided biopsy of this right thyroid nodule.  No other thyroid nodules.   04/22/2018 Surgery   Procedure: 4. Cystoscopy 5. Bilateral retrograde pyelogram with interpretation 6. Bilateral ureteral stent exchange 7. Left ureteroscopy, diagnostic  Surgeon: Ardis Hughs, MD  Intraoperative findings:  #1: On speculum exam there was a tine from the right ureteral stent (Polaris) noted at the vaginal cuff.  The remainder of the vaginal cuff was healed, including the area around the ureteral stent tine. 2.:  The right retrograde pyelogram was performed using 10 cc of Omni, and demonstrated normal caliber ureter with no significant hydroureteronephrosis.  There did not appear to be any communication or extravasation of contrast in the distal ureter or UVJ with the vagina. 3.:  The left retrograde pyelogram demonstrated a small area of extravasation at the UVJ, nearly the intramural ureter, that seemed to communicate with the patient's vagina.  There were no other significant filling defects, there is no hydroureteronephrosis. 4.:  Left diagnostic ureteroscopy demonstrated an abnormality within the intramural or very distal UVJ region where there was some bullous edema and likely the site of the fistula.  There was no identifiable communication.  The remaining aspect of the ureter was normal.   05/28/2018 Surgery   Procedure: 8. Bilateral retrograde pyelogram with interpretation 9. Bilateral diagnostic ureteroscopy 10. Bilateral ureteral stent exchange 11. Bilateral nephrostomy tube removal  Surgeon: Ardis Hughs, MD  Intraoperative findings:  #1: 5 French open-ended ureteral catheter was used to perform a retrograde pyelogram in the patient's left ureter which demonstrated normal caliber ureter  with no hydro-.  However, there was a narrowed  segment at the intramural ureter and just at the UVJ with a prominent fistula draining into the vagina. #2: I removed the wire to see the how well the ureter with drain, it did not drain well at all.  As such I tried to repassed the wire and ultimately had to pass a ureteroscope through the distal ureter in order to get the wire up into the left renal pelvis.  This noted distorted and abnormal mucosa in the intravesical and ureterovesical junction. #3: The patient's right sided ureteral stent distal had migrated into the patient's vagina.  In order to remove the stent I had to perform ureteroscopy on the right ureter cannulating the right ureteral orifice and advancing it through the intramural ureter and beyond the distal ureter.  Once I was up beyond the fistula I passed the wire through the scope and into the right renal pelvis.  Once the wire was in the pelvis I removed the scope and pulled the stent through the patient's vagina. 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 #5: A 24 cm x 6 French double-J ureteral stent was placed in the right ureter. #6: A 22 cm x 5 French Polaris catheter was placed in the patient's left ureter   07/02/2018 Surgery   Procedure: 1. Robotic assisted laparoscopic bilateral ureteral reimplant  Surgeon: Ardis Hughs, MD First Assistant: Dr. [name], MD  Intraoperative findings:  #1: The patient's ureters were thickened, and adherent to the sidewalls, but there were able to be reimplanted without any tension. #2: The patient had a mid urethral sling mesh noted around the suprapubic bone that was unmolested.   08/27/2018 Pathology Results   Vagina, biopsy, left upper vaginal side - wall - SQUAMOUS CELL CARCINOMA.   09/15/2018 Cancer Staging    Staging form: Cervix Uteri, AJCC 7th Edition - Pathologic: FIGO Stage IB1 (T1b1, N0, cM0) - Signed by Heath Lark, MD on 09/16/2018   09/27/2018 Procedure   Successful placement of a right IJ approach Power Port with ultrasound and fluoroscopic guidance. The catheter is ready for use.   09/27/2018 -  Radiation Therapy   She has daily radiation   10/01/2018 -  Chemotherapy   The patient had weekly cisplatin   03/02/2019 PET scan   1. No evidence of metastatic disease. Previously questioned hypermetabolism along the left vaginal cuff is not appreciated on the current study. 2. Hypermetabolic right thyroid nodule. Patient underwent ultrasound-guided biopsy 09/23/2018. 3.  Aortic atherosclerosis (ICD10-170.0).   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     REVIEW OF SYSTEMS:   Constitutional: Denies fevers, chills or abnormal weight loss Eyes: Denies blurriness of vision Ears, nose, mouth, throat, and face: Denies mucositis or sore throat Respiratory: Denies cough, dyspnea or wheezes Cardiovascular: Denies palpitation, chest discomfort or lower extremity swelling Gastrointestinal:  Denies nausea, heartburn or change in bowel habits Skin: Denies abnormal skin rashes Lymphatics: Denies new lymphadenopathy or easy bruising Neurological:Denies numbness, tingling or new weaknesses Behavioral/Psych: Mood is stable, no new changes  All other systems were reviewed with the patient and are negative.  I have reviewed the past medical history, past surgical history, social history and family history with the patient and they are unchanged from previous  note.  ALLERGIES:  has No Known Allergies.  MEDICATIONS:  Current Outpatient Medications  Medication Sig Dispense Refill  . acetaminophen (TYLENOL) 500 MG tablet Take 2  tablets (1,000 mg total) by mouth every 6 (six) hours as needed for moderate pain, fever or headache. 30 tablet 0  . lidocaine-prilocaine (EMLA) cream Apply to affected area once (Patient not taking: Reported on 01/06/2019) 30 g 3  . metFORMIN (GLUCOPHAGE-XR) 500 MG 24 hr tablet Take by mouth.    . ondansetron (ZOFRAN) 8 MG tablet Take 1 tablet (8 mg total) by mouth every 8 (eight) hours as needed. Start on the third day after chemotherapy. (Patient not taking: Reported on 01/06/2019) 30 tablet 1  . prochlorperazine (COMPAZINE) 10 MG tablet Take 1 tablet (10 mg total) by mouth every 6 (six) hours as needed (Nausea or vomiting). (Patient not taking: Reported on 01/06/2019) 30 tablet 1  . solifenacin (VESICARE) 10 MG tablet Take by mouth.    . tamsulosin (FLOMAX) 0.4 MG CAPS capsule Take by mouth.     No current facility-administered medications for this visit.     PHYSICAL EXAMINATION: ECOG PERFORMANCE STATUS: 1 - Symptomatic but completely ambulatory  Vitals:   03/03/19 1219  BP: 123/72  Pulse: 96  Resp: 18  Temp: 98.9 F (37.2 C)  SpO2: 99%   Filed Weights   03/03/19 1219  Weight: 148 lb (67.1 kg)    GENERAL:alert, no distress and comfortable Musculoskeletal:no cyanosis of digits and no clubbing  NEURO: alert & oriented x 3 with fluent speech, no focal motor/sensory deficits  LABORATORY DATA:  I have reviewed the data as listed    Component Value Date/Time   NA 140 03/02/2019 0900   NA 140 05/04/2018 1657   K 4.0 03/02/2019 0900   CL 105 03/02/2019 0900   CO2 27 03/02/2019 0900   GLUCOSE 97 03/02/2019 0900   BUN 11 03/02/2019 0900   BUN 11 05/04/2018 1657   CREATININE 0.76 03/02/2019 0900   CALCIUM 9.3 03/02/2019 0900   PROT 7.6 03/02/2019 0900   PROT 7.3 05/04/2018 1657   ALBUMIN 4.1 03/02/2019 0900   ALBUMIN 4.3 05/04/2018 1657   AST 16 03/02/2019 0900   ALT 9 03/02/2019 0900   ALKPHOS 81 03/02/2019 0900   BILITOT 0.4 03/02/2019 0900   BILITOT <0.2 05/04/2018 1657    GFRNONAA >60 03/02/2019 0900   GFRAA >60 03/02/2019 0900    No results found for: SPEP, UPEP  Lab Results  Component Value Date   WBC 2.9 (L) 03/02/2019   NEUTROABS 1.7 03/02/2019   HGB 11.4 (L) 03/02/2019   HCT 33.3 (L) 03/02/2019   MCV 91.5 03/02/2019   PLT 224 03/02/2019      Chemistry      Component Value Date/Time   NA 140 03/02/2019 0900   NA 140 05/04/2018 1657   K 4.0 03/02/2019 0900   CL 105 03/02/2019 0900   CO2 27 03/02/2019 0900   BUN 11 03/02/2019 0900   BUN 11 05/04/2018 1657   CREATININE 0.76 03/02/2019 0900      Component Value Date/Time   CALCIUM 9.3 03/02/2019 0900   ALKPHOS 81 03/02/2019 0900   AST 16 03/02/2019 0900   ALT 9 03/02/2019 0900   BILITOT 0.4 03/02/2019 0900   BILITOT <0.2 05/04/2018 1657       RADIOGRAPHIC STUDIES: I have reviewed multiple imaging studies with the patient I have personally reviewed the radiological images as listed and agreed with the findings in  the report. Nm Pet Image Restag (ps) Skull Base To Thigh  Result Date: 03/02/2019 CLINICAL DATA:  Subsequent treatment strategy for cervical cancer. EXAM: NUCLEAR MEDICINE PET SKULL BASE TO THIGH TECHNIQUE: 7.4 mCi F-18 FDG was injected intravenously. Full-ring PET imaging was performed from the skull base to thigh after the radiotracer. CT data was obtained and used for attenuation correction and anatomic localization. Fasting blood glucose: 97 mg/dl COMPARISON:  09/14/2018 FINDINGS: Mediastinal blood pool activity: SUV max 2.7 Liver activity: SUV max NA NECK: No abnormal hypermetabolism. Incidental CT findings: none CHEST: Focal hypermetabolism in the right lobe of the thyroid is again seen. No hypermetabolic mediastinal, hilar or axillary nodes. No hypermetabolic nodules. Incidental CT findings: Right IJ Port-A-Cath terminates in the right atrium. Heart is at the upper limits of normal in size to mildly enlarged. No pericardial or pleural effusion. Pulmonary nodules measuring 4  mm or less in size in the left lower lobe are unchanged and too small for PET resolution. ABDOMEN/PELVIS: No abnormal hypermetabolism the liver, adrenal glands, spleen or pancreas. No hypermetabolic lymph nodes. Previously questioned hypermetabolism along the left vaginal cuff is not appreciated on the current study. Incidental CT findings: Liver, gallbladder, adrenal glands, kidneys, spleen, pancreas, stomach and bowel are grossly unremarkable. Retroaortic left renal vein. Mild atherosclerotic calcification of the aorta without aneurysm. SKELETON: No abnormal osseous hypermetabolism. Incidental CT findings: None. IMPRESSION: 1. No evidence of metastatic disease. Previously questioned hypermetabolism along the left vaginal cuff is not appreciated on the current study. 2. Hypermetabolic right thyroid nodule. Patient underwent ultrasound-guided biopsy 09/23/2018. 3.  Aortic atherosclerosis (ICD10-170.0). Electronically Signed   By: Lorin Picket M.D.   On: 03/02/2019 14:13    All questions were answered. The patient knows to call the clinic with any problems, questions or concerns. No barriers to learning was detected.  I spent 15 minutes counseling the patient face to face. The total time spent in the appointment was 20 minutes and more than 50% was on counseling and review of test results  Heath Lark, MD 03/03/2019 2:07 PM

## 2019-03-03 NOTE — Assessment & Plan Note (Signed)
From our prior discussion, I recommend referral to ENT or general surgery for thyroidectomy The patient has not made contact with her primary care doctor but has an appointment coming up next month for further discussion This is a surgical disease; she can be potentially cured with surgery alone I would defer to her primary care doctor for further management

## 2019-03-16 ENCOUNTER — Other Ambulatory Visit: Payer: Self-pay | Admitting: Radiology

## 2019-03-17 ENCOUNTER — Encounter (HOSPITAL_COMMUNITY): Payer: Self-pay

## 2019-03-17 ENCOUNTER — Other Ambulatory Visit: Payer: Self-pay

## 2019-03-17 ENCOUNTER — Ambulatory Visit (HOSPITAL_COMMUNITY)
Admission: RE | Admit: 2019-03-17 | Discharge: 2019-03-17 | Disposition: A | Discharge status: Home / Self Care | Payer: BLUE CROSS/BLUE SHIELD | Source: Ambulatory Visit | Attending: Hematology and Oncology | Admitting: Hematology and Oncology

## 2019-03-17 DIAGNOSIS — E785 Hyperlipidemia, unspecified: Secondary | ICD-10-CM | POA: Diagnosis not present

## 2019-03-17 DIAGNOSIS — Z90722 Acquired absence of ovaries, bilateral: Secondary | ICD-10-CM | POA: Diagnosis not present

## 2019-03-17 DIAGNOSIS — Z8249 Family history of ischemic heart disease and other diseases of the circulatory system: Secondary | ICD-10-CM | POA: Diagnosis not present

## 2019-03-17 DIAGNOSIS — Z452 Encounter for adjustment and management of vascular access device: Secondary | ICD-10-CM | POA: Diagnosis present

## 2019-03-17 DIAGNOSIS — Z79899 Other long term (current) drug therapy: Secondary | ICD-10-CM | POA: Insufficient documentation

## 2019-03-17 DIAGNOSIS — Z7984 Long term (current) use of oral hypoglycemic drugs: Secondary | ICD-10-CM | POA: Insufficient documentation

## 2019-03-17 DIAGNOSIS — R7303 Prediabetes: Secondary | ICD-10-CM | POA: Diagnosis not present

## 2019-03-17 DIAGNOSIS — C539 Malignant neoplasm of cervix uteri, unspecified: Secondary | ICD-10-CM | POA: Diagnosis not present

## 2019-03-17 DIAGNOSIS — Z90710 Acquired absence of both cervix and uterus: Secondary | ICD-10-CM | POA: Insufficient documentation

## 2019-03-17 HISTORY — PX: IR REMOVAL TUN ACCESS W/ PORT W/O FL MOD SED: IMG2290

## 2019-03-17 LAB — CBC
HCT: 34.3 % — ABNORMAL LOW (ref 36.0–46.0)
Hemoglobin: 11.2 g/dL — ABNORMAL LOW (ref 12.0–15.0)
MCH: 30.2 pg (ref 26.0–34.0)
MCHC: 32.7 g/dL (ref 30.0–36.0)
MCV: 92.5 fL (ref 80.0–100.0)
Platelets: 257 10*3/uL (ref 150–400)
RBC: 3.71 MIL/uL — ABNORMAL LOW (ref 3.87–5.11)
RDW: 11.9 % (ref 11.5–15.5)
WBC: 3.1 10*3/uL — ABNORMAL LOW (ref 4.0–10.5)
nRBC: 0 % (ref 0.0–0.2)

## 2019-03-17 LAB — BASIC METABOLIC PANEL
Anion gap: 10 (ref 5–15)
BUN: 17 mg/dL (ref 6–20)
CO2: 25 mmol/L (ref 22–32)
Calcium: 9.2 mg/dL (ref 8.9–10.3)
Chloride: 103 mmol/L (ref 98–111)
Creatinine, Ser: 0.6 mg/dL (ref 0.44–1.00)
GFR calc Af Amer: >60 mL/min (ref >60)
GFR calc non Af Amer: >60 mL/min (ref >60)
Glucose, Bld: 109 mg/dL — ABNORMAL HIGH (ref 70–99)
Potassium: 3.8 mmol/L (ref 3.5–5.1)
Sodium: 138 mmol/L (ref 135–145)

## 2019-03-17 LAB — PROTIME-INR
INR: 0.9 (ref 0.8–1.2)
Prothrombin Time: 12.2 seconds (ref 11.4–15.2)

## 2019-03-17 LAB — GLUCOSE, CAPILLARY: Glucose-Capillary: 90 mg/dL (ref 70–99)

## 2019-03-17 MED ORDER — MIDAZOLAM HCL 2 MG/2ML IJ SOLN
INTRAMUSCULAR | Status: AC | PRN
  Administered 2019-03-17 (×2): 1 mg via INTRAVENOUS

## 2019-03-17 MED ORDER — FENTANYL CITRATE (PF) 100 MCG/2ML IJ SOLN
INTRAMUSCULAR | Status: AC
  Filled 2019-03-17: qty 2

## 2019-03-17 MED ORDER — LIDOCAINE-EPINEPHRINE (PF) 2 %-1:200000 IJ SOLN
INTRAMUSCULAR | Status: AC | PRN
  Administered 2019-03-17: 10 mL

## 2019-03-17 MED ORDER — MIDAZOLAM HCL 2 MG/2ML IJ SOLN
INTRAMUSCULAR | Status: AC
  Filled 2019-03-17: qty 2

## 2019-03-17 MED ORDER — CEFAZOLIN SODIUM-DEXTROSE 2-4 GM/100ML-% IV SOLN
2.0000 g | INTRAVENOUS | Status: AC
  Administered 2019-03-17: 2 g via INTRAVENOUS

## 2019-03-17 MED ORDER — CEFAZOLIN SODIUM-DEXTROSE 2-4 GM/100ML-% IV SOLN
INTRAVENOUS | Status: AC
  Administered 2019-03-17: 10:00:00 2 g via INTRAVENOUS
  Filled 2019-03-17: qty 100

## 2019-03-17 MED ORDER — LIDOCAINE-EPINEPHRINE (PF) 2 %-1:200000 IJ SOLN
INTRAMUSCULAR | Status: AC
  Filled 2019-03-17: qty 20

## 2019-03-17 MED ORDER — SODIUM CHLORIDE 0.9 % IV SOLN
INTRAVENOUS | Status: DC
  Administered 2019-03-17: 09:00:00 via INTRAVENOUS

## 2019-03-17 MED ORDER — FENTANYL CITRATE (PF) 100 MCG/2ML IJ SOLN
INTRAMUSCULAR | Status: AC | PRN
  Administered 2019-03-17 (×2): 50 ug via INTRAVENOUS

## 2019-03-17 NOTE — H&P (Signed)
Chief Complaint: Completed chemotherapy  Referring Physician(s): [place]  Supervising Physician: Jacqulynn Cadet  Patient Status: [facility] - Out-pt  History of Present Illness: Amy Morrow is a 58 y.o. female who is now in remission from cervical cancer.  She has completed her chemotherapy and is requesting removal of her Port A Cath.  It was initially placed by Dr. Laurence Ferrari on 09/27/2018. It has worked well with no issues and no signs of infection.  She is NPO. No blood thinners. No nausea/vomiting. No Fever/chills. ROS negative.   Past Medical History:  Diagnosis Date  . Abnormal Pap smear   . Anemia   . Cervical cancer [facility]) oncologist- dr Denman George   Stage IB1  SCCa   . Dyspnea   . Fatigue   . Hyperlipidemia   . Left breast mass 2013   7x4x4 mm 6o'clock  . PONV (postoperative nausea and vomiting)   . Pre-diabetes   . Sepsis ([place]) 03/2018   Urosepsis, Resolved  . Thyroid nodule 03/25/2018   inferior right thyroid, noted on US thyroid  . Ureterovaginal fistula   . Urgency of urination    intermittant  . Urinary frequency     Past Surgical History:  Procedure Laterality Date  . ABDOMINAL HYSTERECTOMY    . BREAST BIOPSY Left 06/15/2012  . CYSTOSCOPY W/ RETROGRADES Bilateral 02/16/2018   Procedure: CYSTOSCOPY WITH RETROGRADE PYELOGRAM BILATERAL DIAGNOSTIC URETEROSCOPY, BILATERAL  STENT PLACEMENT;  Surgeon: Ardis Hughs, MD;  Location: [facility];  Service: Urology;  Laterality: Bilateral;  . CYSTOSCOPY W/ URETERAL STENT PLACEMENT Bilateral 04/22/2018   Procedure: CYSTOSCOPY WITH BILATERAL RETROGRADE PYELOGRAM/ AND URETERAL STENT EXCHANGE, vaginal exam;  Surgeon: Ardis Hughs, MD;  Location: [facility];  Service: Urology;  Laterality: Bilateral;  . CYSTOSCOPY W/ URETERAL STENT PLACEMENT Bilateral 05/28/2018   Procedure: CYSTOSCOPY WITH BILATERAL  RETROGRADE PYELOGRAM/URETERAL STENT REPLACEMENT, REMOVAL OF BILATERAL PERCUTANEOUS  DRAINS;  Surgeon: Ardis Hughs, MD;  Location: [facility];  Service: Urology;  Laterality: Bilateral;  . D & C LEEP CONIZATION BX  07-31-2003   dr p. rose [facility]  . IR IMAGING GUIDED PORT INSERTION  09/27/2018  . IR NEPHROSTOGRAM RIGHT THRU EXISTING ACCESS  03/18/2018  . IR NEPHROSTOMY EXCHANGE LEFT  04/27/2018  . IR NEPHROSTOMY EXCHANGE RIGHT  04/27/2018  . IR NEPHROSTOMY PLACEMENT LEFT  03/18/2018  . IR NEPHROSTOMY PLACEMENT RIGHT  03/02/2018  . PELVIC LYMPH NODE DISSECTION Bilateral 02/08/2018   Procedure: BILATEAL PEVIC  LYMPHADENECTOMY;  Surgeon: [name], MD;  Location: [facility];  Service: Gynecology;  Laterality: Bilateral;  . ROBOTIC ASSISTED TOTAL HYSTERECTOMY WITH BILATERAL SALPINGO OOPHERECTOMY N/A 02/08/2018   Procedure: XI ROBOTIC ASSISTED TOTAL Radical HYSTERECTOMY WITH BILATERAL SALPINGO OOPHORECTOMY;  Surgeon: [name], MD;  Location: [facility];  Service: Gynecology;  Laterality: N/A;  . Transvaginal tape placement  03-12-2009  dr Emeterio Reeve  [facility]   GYNECARE TENSION-FREE VAGINAL TAPE SLING  . TUBAL LIGATION  05-28-2005  DR  MARSHALL  @ [facility]   PPTL    Allergies: Patient has no known allergies.  Medications: Prior to Admission medications   Medication Sig Start Date End Date Taking? Authorizing Provider  acetaminophen (TYLENOL) 500 MG tablet Take 2 tablets (1,000 mg total) by mouth every 6 (six) hours as needed for moderate pain, fever or headache. 06/25/18   Ardis Hughs, MD  lidocaine-prilocaine (EMLA) cream Apply to affected area once Patient not taking: Reported on 01/06/2019 09/23/18   Heath Lark, MD  metFORMIN (GLUCOPHAGE-XR) 500 MG 24 hr tablet Take by mouth. 11/12/18   [provider]  ondansetron (ZOFRAN) 8 MG tablet Take 1 tablet (8 mg total) by mouth every 8 (eight) hours as needed. Start on the third day after chemotherapy. Patient not taking: Reported on 01/06/2019 10/28/18   Heath Lark, MD  prochlorperazine (COMPAZINE) 10 MG tablet Take 1  tablet (10 mg total) by mouth every 6 (six) hours as needed (Nausea or vomiting). Patient not taking: Reported on 01/06/2019 10/28/18   Heath Lark, MD  solifenacin (VESICARE) 10 MG tablet Take by mouth. 12/28/18   [provider]  tamsulosin (FLOMAX) 0.4 MG CAPS capsule Take by mouth. 11/19/18   [provider]     Family History  Problem Relation Age of Onset  . Hypertension Mother   . Heart disease Mother   . Hypertension Brother   . Heart disease Brother     Social History   Socioeconomic History  . Marital status: Married    Spouse name: Not on file  . Number of children: Not on file  . Years of education: Not on file  . Highest education level: Not on file  Occupational History  . Not on file  Social Needs  . Financial resource strain: Not on file  . Food insecurity    Worry: Not on file    Inability: Not on file  . Transportation needs    Medical: Not on file    Non-medical: Not on file  Tobacco Use  . Smoking status: Never Smoker  . Smokeless tobacco: Never Used  Substance and Sexual Activity  . Alcohol use: No  . Drug use: No  . Sexual activity: Yes    Birth control/protection: Surgical  Lifestyle  . Physical activity    Days per week: Not on file    Minutes per session: Not on file  . Stress: Not on file  Relationships  . Social Herbalist on phone: Not on file    Gets together: Not on file    Attends religious service: Not on file    Active member of club or organization: Not on file    Attends meetings of clubs or organizations: Not on file    Relationship status: Not on file  Other Topics Concern  . Not on file  Social History Narrative  . Not on file     Review of Systems: A 12 point ROS discussed and pertinent positives are indicated in the HPI above.  All other systems are negative.  Review of Systems  Vital Signs: BP 120/85 (BP Location: Right Arm)   Pulse 95   Temp 98.1 F (36.7 C) (Oral)   Resp 18   LMP  11/16/2012   SpO2 95%   Physical Exam Vitals signs reviewed.  Constitutional:      Appearance: Normal appearance.  HENT:     Head: Normocephalic and atraumatic.  Eyes:     Extraocular Movements: Extraocular movements intact.  Neck:     Musculoskeletal: Normal range of motion.  Cardiovascular:     Rate and Rhythm: Normal rate and regular rhythm.  Pulmonary:     Effort: Pulmonary effort is normal. No respiratory distress.     Breath sounds: Normal breath sounds.  Abdominal:     General: There is no distension.     Palpations: Abdomen is soft.     Tenderness: There is no abdominal tenderness.  Musculoskeletal: Normal range of motion.  Skin:  General: Skin is warm.  Neurological:     General: No focal deficit present.     Mental Status: She is alert and oriented to person, place, and time.  Psychiatric:        Mood and Affect: Mood normal.        Behavior: Behavior normal.        Thought Content: Thought content normal.        Judgment: Judgment normal.     Imaging: Nm Pet Image Restag (ps) Skull Base To Thigh  Result Date: 03/02/2019 CLINICAL DATA:  Subsequent treatment strategy for cervical cancer. EXAM: NUCLEAR MEDICINE PET SKULL BASE TO THIGH TECHNIQUE: 7.4 mCi F-18 FDG was injected intravenously. Full-ring PET imaging was performed from the skull base to thigh after the radiotracer. CT data was obtained and used for attenuation correction and anatomic localization. Fasting blood glucose: 97 mg/dl COMPARISON:  09/14/2018 FINDINGS: Mediastinal blood pool activity: SUV max 2.7 Liver activity: SUV max NA NECK: No abnormal hypermetabolism. Incidental CT findings: none CHEST: Focal hypermetabolism in the right lobe of the thyroid is again seen. No hypermetabolic mediastinal, hilar or axillary nodes. No hypermetabolic nodules. Incidental CT findings: Right IJ Port-A-Cath terminates in the right atrium. Heart is at the upper limits of normal in size to mildly enlarged. No  pericardial or pleural effusion. Pulmonary nodules measuring 4 mm or less in size in the left lower lobe are unchanged and too small for PET resolution. ABDOMEN/PELVIS: No abnormal hypermetabolism the liver, adrenal glands, spleen or pancreas. No hypermetabolic lymph nodes. Previously questioned hypermetabolism along the left vaginal cuff is not appreciated on the current study. Incidental CT findings: Liver, gallbladder, adrenal glands, kidneys, spleen, pancreas, stomach and bowel are grossly unremarkable. Retroaortic left renal vein. Mild atherosclerotic calcification of the aorta without aneurysm. SKELETON: No abnormal osseous hypermetabolism. Incidental CT findings: None. IMPRESSION: 1. No evidence of metastatic disease. Previously questioned hypermetabolism along the left vaginal cuff is not appreciated on the current study. 2. Hypermetabolic right thyroid nodule. Patient underwent ultrasound-guided biopsy 09/23/2018. 3.  Aortic atherosclerosis (ICD10-170.0). Electronically Signed   By: Lorin Picket M.D.   On: 03/02/2019 14:13    Labs:  CBC: Recent Labs    10/27/18 1102 12/10/18 0923 01/28/19 0940 03/02/19 0900  WBC 2.3* 2.7* 2.8* 2.9*  HGB 10.8* 10.0* 10.3* 11.4*  HCT 32.6* 30.5* 30.5* 33.3*  PLT 189 262 228 224    COAGS: Recent Labs    03/18/18 1319 06/26/18 1703 09/27/18 1043  INR 1.00 1.15 1.1  APTT 31 38*  --     BMP: Recent Labs    10/27/18 1102 12/10/18 0923 01/28/19 0940 03/02/19 0900  NA 138 140 140 140  K 4.5 4.0 3.8 4.0  CL 98 108 106 105  CO2 31 25 25 27   GLUCOSE 102* 97 92 97  BUN 11 13 14 11   CALCIUM 9.0 8.7* 9.2 9.3  CREATININE 0.74 0.67 0.75 0.76  GFRNONAA >60 >60 >60 >60  GFRAA >60 >60 >60 >60    LIVER FUNCTION TESTS: Recent Labs    10/27/18 1102 12/10/18 0923 01/28/19 0940 03/02/19 0900  BILITOT 0.4 0.3 0.7 0.4  AST 16 12* 16 16  ALT 16 8 9 9   ALKPHOS 88 85 79 81  PROT 7.3 7.3 7.5 7.6  ALBUMIN 3.8 3.8 4.1 4.1    TUMOR MARKERS:  No results for input(s): AFPTM, CEA, CA199, CHROMGRNA in the last 8760 hours.  Assessment and Plan:  Completion of chemotherapy for cervical cancer.  Will proceed with removal of her tunneled catheter today by Dr. Laurence Ferrari.  Risks and benefits of tunneled catheter removal were discussed with the patient including bleeding, infection, damage to adjacent structures, malfunction of the catheter with need for additional procedures.  All of the patient's questions were answered, patient is agreeable to proceed. Consent signed and in chart.  Thank you for this interesting consult.  I greatly enjoyed meeting YANISSA MICHALSKY and look forward to participating in their care.  A copy of this report was sent to the requesting provider on this date.  Electronically Signed: Murrell Redden, [name]   03/17/2019, 8:23 AM      I spent a total of    25 Minutes in face to face in clinical consultation, greater than 50% of which was counseling/coordinating care for removal of tunneled catheter with Port.

## 2019-03-17 NOTE — Procedures (Signed)
Interventional Radiology Procedure Note  Procedure: Removal of portacath  Complications: None  Estimated Blood Loss: None  Recommendations: - DC home  Signed,  Criselda Peaches, MD

## 2019-03-17 NOTE — Discharge Instructions (Signed)
Moderate Conscious Sedation, Adult, Care After These instructions provide you with information about caring for yourself after your procedure. Your health care provider may also give you more specific instructions. Your treatment has been planned according to current medical practices, but problems sometimes occur. Call your health care provider if you have any problems or questions after your procedure. What can I expect after the procedure? After your procedure, it is common:  To feel sleepy for several hours.  To feel clumsy and have poor balance for several hours.  To have poor judgment for several hours.  To vomit if you eat too soon. Follow these instructions at home: For at least 24 hours after the procedure:   Do not: ? Participate in activities where you could fall or become injured. ? Drive. ? Use heavy machinery. ? Drink alcohol. ? Take sleeping pills or medicines that cause drowsiness. ? Make important decisions or sign legal documents. ? Take care of children on your own.  Rest. Eating and drinking  Follow the diet recommended by your health care provider.  If you vomit: ? Drink water, juice, or soup when you can drink without vomiting. ? Make sure you have little or no nausea before eating solid foods. General instructions  Have a responsible adult stay with you until you are awake and alert.  Take over-the-counter and prescription medicines only as told by your health care provider.  If you smoke, do not smoke without supervision.  Keep all follow-up visits as told by your health care provider. This is important. Contact a health care provider if:  You keep feeling nauseous or you keep vomiting.  You feel light-headed.  You develop a rash.  You have a fever. Get help right away if:  You have trouble breathing. This information is not intended to replace advice given to you by your health care provider. Make sure you discuss any questions you have  with your health care provider. Document Released: 05/11/2013 Document Revised: 07/03/2017 Document Reviewed: 11/10/2015 Elsevier Patient Education  2020 [place] Removal, Care After This sheet gives you information about how to care for yourself after your procedure. Your health care provider may also give you more specific instructions. If you have problems or questions, contact your health care provider. What can I expect after the procedure? After the procedure, it is common to have:  Soreness or pain near your incision.  Some swelling or bruising near your incision. Follow these instructions at home: Medicines  Take over-the-counter and prescription medicines only as told by your health care provider.  If you were prescribed an antibiotic medicine, take it as told by your health care provider. Do not stop taking the antibiotic even if you start to feel better. Bathing  Do not take baths, swim, or use a hot tub until your health care provider approves. Ask your health care provider if you can take showers. You may only be allowed to take sponge baths.  You may shower tomorrow. Incision care   Follow instructions from your health care provider about how to take care of your incision. Make sure you: ? Wash your hands with soap and water before you change your bandage (dressing). If soap and water are not available, use hand sanitizer. ? Change your dressing as told by your health care provider.  You may remove your dressing tomorrow. ? Keep your dressing dry. ? Leave stitches (sutures), skin glue, or adhesive strips in place. These skin closures may  need to stay in place for 2 weeks or longer. If adhesive strip edges start to loosen and curl up, you may trim the loose edges. Do not remove adhesive strips completely unless your health care provider tells you to do that.  Check your incision area every day for signs of infection. Check for: ? More redness,  swelling, or pain. ? More fluid or blood. ? Warmth. ? Pus or a bad smell. Driving   Do not drive for 24 hours if you were given a medicine to help you relax (sedative) during your procedure.  If you did not receive a sedative, ask your health care provider when it is safe to drive. Activity  Return to your normal activities as told by your health care provider. Ask your health care provider what activities are safe for you.  Do not lift anything that is heavier than 10 lb (4.5 kg), or the limit that you are told, until your health care provider says that it is safe.  Do not do activities that involve lifting your arms over your head. General instructions  Do not use any products that contain nicotine or tobacco, such as cigarettes and e-cigarettes. These can delay healing. If you need help quitting, ask your health care provider.  Keep all follow-up visits as told by your health care provider. This is important. Contact a health care provider if:  You have more redness, swelling, or pain around your incision.  You have more fluid or blood coming from your incision.  Your incision feels warm to the touch.  You have pus or a bad smell coming from your incision.  You have pain that is not relieved by your pain medicine. Get help right away if you have:  A fever or chills.  Chest pain.  Difficulty breathing. Summary  After the procedure, it is common to have pain, soreness, swelling, or bruising near your incision.  If you were prescribed an antibiotic medicine, take it as told by your health care provider. Do not stop taking the antibiotic even if you start to feel better.  Do not drive for 24 hours if you were given a sedative during your procedure.  Return to your normal activities as told by your health care provider. Ask your health care provider what activities are safe for you. This information is not intended to replace advice given to you by your health care  provider. Make sure you discuss any questions you have with your health care provider. Document Released: 07/02/2015 Document Revised: 09/03/2017 Document Reviewed: 09/03/2017 Elsevier Patient Education  2020 [name].

## 2019-03-25 MED FILL — SOLIFENACIN SUCCINATE 10 MG: 10 | 30 days supply | Qty: 30 | Fill #1

## 2019-05-10 MED FILL — SOLIFENACIN SUCCINATE 10 MG: 10 | 30 days supply | Qty: 30 | Fill #2

## 2019-06-01 ENCOUNTER — Inpatient Hospital Stay (HOSPITAL_COMMUNITY): Admit: 2019-06-01 | Payer: BLUE CROSS/BLUE SHIELD

## 2019-06-01 ENCOUNTER — Encounter: Payer: Self-pay | Admitting: Gynecologic Oncology

## 2019-06-01 ENCOUNTER — Inpatient Hospital Stay: Payer: BLUE CROSS/BLUE SHIELD | Attending: Gynecologic Oncology | Admitting: Gynecologic Oncology

## 2019-06-01 ENCOUNTER — Other Ambulatory Visit: Payer: Self-pay

## 2019-06-01 VITALS — BP 111/69 | HR 80 | Temp 98.3°F | Resp 17 | Ht 59.0 in | Wt 146.9 lb

## 2019-06-01 DIAGNOSIS — Z9221 Personal history of antineoplastic chemotherapy: Secondary | ICD-10-CM | POA: Diagnosis not present

## 2019-06-01 DIAGNOSIS — N821 Other female urinary-genital tract fistulae: Secondary | ICD-10-CM | POA: Insufficient documentation

## 2019-06-01 DIAGNOSIS — Z90722 Acquired absence of ovaries, bilateral: Secondary | ICD-10-CM | POA: Diagnosis not present

## 2019-06-01 DIAGNOSIS — Z79899 Other long term (current) drug therapy: Secondary | ICD-10-CM | POA: Insufficient documentation

## 2019-06-01 DIAGNOSIS — Z7984 Long term (current) use of oral hypoglycemic drugs: Secondary | ICD-10-CM | POA: Insufficient documentation

## 2019-06-01 DIAGNOSIS — N950 Postmenopausal bleeding: Secondary | ICD-10-CM | POA: Insufficient documentation

## 2019-06-01 DIAGNOSIS — Z90710 Acquired absence of both cervix and uterus: Secondary | ICD-10-CM | POA: Diagnosis not present

## 2019-06-01 DIAGNOSIS — Z923 Personal history of irradiation: Secondary | ICD-10-CM | POA: Insufficient documentation

## 2019-06-01 DIAGNOSIS — E119 Type 2 diabetes mellitus without complications: Secondary | ICD-10-CM | POA: Insufficient documentation

## 2019-06-01 DIAGNOSIS — C539 Malignant neoplasm of cervix uteri, unspecified: Secondary | ICD-10-CM | POA: Insufficient documentation

## 2019-06-01 DIAGNOSIS — E785 Hyperlipidemia, unspecified: Secondary | ICD-10-CM | POA: Diagnosis not present

## 2019-06-01 NOTE — Progress Notes (Signed)
Follow-up Note: GYN-ONC  Consult was requested by Dr. Darron Doom, MD   CC:  Chief Complaint  Patient presents with  . Recurrent cervical cancer (HCC)  history of uretero-vaginal fistulae.  Patient was seen with an interpretor.   Assessment/Plan: 1. 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   2. Ureterovaginal fistula (bilateral)  o S/p repair with Dr Louis Meckel on 06/24/18.  o No residual symptoms  She will see Dr Sondra Come in 3 months and myself in 6 months.   HPI: [name]  is a very nice 58 y.o.  P5  She has been undergoing cervical cancer screening through [place] [facility]. 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 01/2012 LSIL + endometrial cells 07/2012 Pap negative 01/2013 Pap negative with HRHPV not detected 01/2015 Pap negative and HRHPV not detected  Was noting postmenopausal bleeding and saw [place] [facility] again. 12/2017 Pap negative now HRHPV detected - Mass noted on exam and she was referred to the [facility] where Dr. 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   Referred for management. Only complaint is the PMB. Denies pain. States bleeding minimal.  She underwent a PET/CT which showed no apparent metastatic disease. On 02/08/18 she underwent robotic assisted radical hysterectomy, upper vaginectomy, bilateral pelvic lymphadenectomy.  Operative findings were significant for a 2 to 3 cm pedunculated exophytic mass from the right anterior cervix.  There is no clinical involvement of the parametrium and no suspicious nodes.  The surgery was overall fairly uncomplicated there was some increased bleeding encountered during the dissection around the posterior bladder pillars.  At the completion of the procedure the ureters bilaterally appeared intact and were completely  skeletonized in the distal third free from all parametrial tissue.  Patient was readmitted on postop day 7 with bilateral ureterovaginal fistulas is confirmed on both CT urogram, as well as cystoscopy with retrograde pyelography with Dr. Louis Meckel.  During that cystoscopic procedure Dr. Louis Meckel placed ureteral stents bilaterally.  The patient was treated with broad-spectrum antibiotics to cover pelvic infection.  She was discharged after a 3-day hospital stay.  On 03/02/18 she underwent right percutaneous nephrostomy tube placement. Immediately after placement she developed fevers and hypotension and was admitted with sepsis from occult pyelonephritis. She received IVF resuscitation and IV antibiotics. She was discharged on 03/06/18. After placement of the right PCN she noted slight decrease in urinary leakage from the vagina, though still persisted.  Therefore placement of a left PCN was placed on 03/18/18 and this improved urinary leakage from the vagina substantially. She was placed on prophylactic nitrofurantoid 100mg  daily.  She was followed by Dr Louis Meckel who performed ureteroscopy in early October, 2019 and identified resolution of left fistula and almost resolution in right. The stents were replaced with a plan to remove PCN's later this month and then stents if fistulae clinically resolved.  On 06/24/18 Dr Louis Meckel performed robotic assisted bilateral ureteral reimplantation (ureteroneocystotomy). She did well postop.   Postop she reported symptoms of incomplete voiding after emptying her bladder that requires change in position of her pelvis in order to completely empty.   She was seen on 08/27/17 as part of routine scheduled surveillance. She denied vaginal bleeding symptoms.   On vaginal examiation, a soft exophytic 1cm lesion was identified at the left vaginal fornix. It was biopsied. It was  resulted as squamous cell carcinoma, consistent with recurrence.  PET showed localized disease at the  left vaginal fornix and no distant sites (though a thyroid nodule was pet avid).    Interval Hx:  She was treated with salvage chemoradiation between September 27, 2018 and December 01, 2018.  This included 5 cycles of weekly radiosensitizing cisplatin 40 mg per metered squared.  Additionally which she received IMRT with 45 Gray in 25 fractions to the pelvis and vagina between therapy 24th and November 01, 2018, and high-dose-rate vaginal cuff brachytherapy, 24 Gray in 4 fractions, delivered between April 8 and December 01, 2018.  She tolerated therapy well and had no adverse effects.  After completing therapy she underwent thyroidectomy at [facility] hospital with benign findings.  Post treatment PET was performed (prior to her thyroidectomy) on March 02, 2019.  This revealed persistent focal hypermetabolism in the right lobe of the thyroid, no evidence of metastatic disease, previously questioned hypermetabolism along the left vaginal cuff was not appreciated.  She was felt to have had a complete clinical response to her therapy and transitioned to surveillance.   Current Meds:  Outpatient Encounter Medications as of 06/01/2019  Medication Sig  . acetaminophen (TYLENOL) 500 MG tablet Take 2 tablets (1,000 mg total) by mouth every 6 (six) hours as needed for moderate pain, fever or headache.  . lidocaine-prilocaine (EMLA) cream Apply to affected area once  . metFORMIN (GLUCOPHAGE-XR) 500 MG 24 hr tablet Take by mouth.  . ondansetron (ZOFRAN) 8 MG tablet Take 1 tablet (8 mg total) by mouth every 8 (eight) hours as needed. Start on the third day after chemotherapy.  . prochlorperazine (COMPAZINE) 10 MG tablet Take 1 tablet (10 mg total) by mouth every 6 (six) hours as needed (Nausea or vomiting).  . solifenacin (VESICARE) 10 MG tablet Take by mouth.  . tamsulosin (FLOMAX) 0.4 MG CAPS capsule Take by mouth.   No facility-administered encounter medications on file as of 06/01/2019.      Allergy: No Known Allergies  Social Hx:  Tobacco use: none Alcohol use: none Illicit Drug use: none Illicit IV Drug use: none  Past Surgical Hx:  Past Surgical History:  Procedure Laterality Date  . ABDOMINAL HYSTERECTOMY    . BREAST BIOPSY Left 06/15/2012  . CYSTOSCOPY W/ RETROGRADES Bilateral 02/16/2018   Procedure: CYSTOSCOPY WITH RETROGRADE PYELOGRAM BILATERAL DIAGNOSTIC URETEROSCOPY, BILATERAL  STENT PLACEMENT;  Surgeon: Ardis Hughs, MD;  Location: [facility];  Service: Urology;  Laterality: Bilateral;  . CYSTOSCOPY W/ URETERAL STENT PLACEMENT Bilateral 04/22/2018   Procedure: CYSTOSCOPY WITH BILATERAL RETROGRADE PYELOGRAM/ AND URETERAL STENT EXCHANGE, vaginal exam;  Surgeon: Ardis Hughs, MD;  Location: [facility];  Service: Urology;  Laterality: Bilateral;  . CYSTOSCOPY W/ URETERAL STENT PLACEMENT Bilateral 05/28/2018   Procedure: CYSTOSCOPY WITH BILATERAL  RETROGRADE PYELOGRAM/URETERAL STENT REPLACEMENT, REMOVAL OF BILATERAL PERCUTANEOUS DRAINS;  Surgeon: Ardis Hughs, MD;  Location: [facility];  Service: Urology;  Laterality: Bilateral;  . D & C LEEP CONIZATION BX  07-31-2003   dr p. rose [facility]  . IR IMAGING GUIDED PORT INSERTION  09/27/2018  . IR NEPHROSTOGRAM RIGHT THRU EXISTING ACCESS  03/18/2018  . IR NEPHROSTOMY EXCHANGE LEFT  04/27/2018  . IR NEPHROSTOMY EXCHANGE RIGHT  04/27/2018  . IR NEPHROSTOMY PLACEMENT LEFT  03/18/2018  . IR NEPHROSTOMY PLACEMENT RIGHT  03/02/2018  . IR REMOVAL TUN ACCESS W/ PORT W/O FL MOD SED  03/17/2019  . PELVIC LYMPH NODE DISSECTION Bilateral  02/08/2018   Procedure: BILATEAL PEVIC  LYMPHADENECTOMY;  Surgeon: [name], MD;  Location: [facility];  Service: Gynecology;  Laterality: Bilateral;  . ROBOTIC ASSISTED TOTAL HYSTERECTOMY WITH BILATERAL SALPINGO OOPHERECTOMY N/A 02/08/2018   Procedure: XI ROBOTIC ASSISTED TOTAL Radical HYSTERECTOMY WITH BILATERAL SALPINGO OOPHORECTOMY;  Surgeon: [name], MD;   Location: [facility];  Service: Gynecology;  Laterality: N/A;  . Transvaginal tape placement  03-12-2009  dr Emeterio Reeve  [facility]   GYNECARE TENSION-FREE VAGINAL TAPE SLING  . TUBAL LIGATION  05-28-2005  DR  MARSHALL  @ [facility]   PPTL    Past Medical Hx:  Past Medical History:  Diagnosis Date  . Abnormal Pap smear   . Anemia   . Cervical cancer [facility]) oncologist- dr Denman George   Stage IB1  SCCa   . Dyspnea   . Fatigue   . Hyperlipidemia   . Left breast mass 2013   7x4x4 mm 6o'clock  . PONV (postoperative nausea and vomiting)   . Pre-diabetes   . Sepsis ([place]) 03/2018   Urosepsis, Resolved  . Thyroid nodule 03/25/2018   inferior right thyroid, noted on US thyroid  . Ureterovaginal fistula   . Urgency of urination    intermittant  . Urinary frequency     Past Gynecological History:   GYNECOLOGIC HISTORY:  Patient's last menstrual period was 11/16/2012. Menarche: 58 years old P 5 LMP 58 yo Contraceptive 5 years OCP HRT none  Last Pap see HPI  Family Hx:  Family History  Problem Relation Age of Onset  . Hypertension Mother   . Heart disease Mother   . Hypertension Brother   . Heart disease Brother     Review of Systems:  Review of Systems  Constitutional: Negative.   HENT:  Negative.   Eyes: Negative.   Respiratory: Negative.   Cardiovascular: Negative.   Endocrine: Negative.   Genitourinary: Negative.         No leakage of urine from vagina. + sensation of incomplete emptying.   Musculoskeletal: Negative.   Skin: Negative.   Neurological: Negative.   Hematological: Negative.   Psychiatric/Behavioral: Negative for depression.    Vitals:  Blood pressure 111/69, pulse 80, temperature 98.3 F (36.8 C), temperature source Temporal, resp. rate 17, height 4\' 11"  (1.499 m), weight 146 lb 14.4 oz (66.6 kg), last menstrual period 11/16/2012, SpO2 100 %. Body mass index is 29.67 kg/m.   Physical Exam: ECOG PERFORMANCE STATUS: 1 - Symptomatic but completely  ambulatory   General :  Well developed, 58 y.o., female in no apparent distress HEENT:  Normocephalic/atraumatic, symmetric, EOMI, eyelids normal Neck:   Supple, no masses. Well healed thyroidectomy incision. Lymphatics:  No cervical/ submandibular/ supraclavicular/ infraclavicular/ inguinal adenopathy Respiratory:  Respirations unlabored, no use of accessory muscles CV:   Deferred Breast:  Deferred Musculoskeletal: No CVA tenderness, normal muscle strength. Abdomen:  Soft, non-tender and nondistended. No evidence of hernia. No masses. Well healed incisions.  Extremities:  No lymphedema, no erythema, non-tender. 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  Neuro/Psych:  No focal motor deficit, no abnormal mental status. Normal gait. Normal affect. Alert and oriented to person, place, and time  Genito Urinary: Speculum exam reveals no blood. No lesions or palpable masses. Upper vaginal agglutination (broken up digitally). Pap taken.  Rectovaginal:  No rectal lesions, mass not appreciated via rectum, no paravaginal infiltration.    Thereasa Solo, MD  06/01/2019, 2:08 PM    Cc: Darron Doom, MD (Referring Ob/Gyn)

## 2019-06-01 NOTE — Patient Instructions (Signed)
Please have Dr Clabe Seal office contact Dr Serita Grit office (at [phone]) after your appointment in January to request an appointment with her for April, 2021.  Please notify Dr Denman George at phone number [phone] if you notice vaginal bleeding, new pelvic or abdominal pains, bloating, feeling full easy, or a change in bladder or bowel function.   Please use the vaginal dilator 2 times per week to keep the tissues open.  Dr Denman George stretched the vaginal tissues today and therefore there may be some bleeding for a few days which is normal.  She performed a pap smear today and will call you with the results in approximately 1 week.

## 2019-06-08 LAB — CYTOLOGY - PAP
Comment: NEGATIVE
Diagnosis: NEGATIVE
High risk HPV: NEGATIVE

## 2019-06-09 ENCOUNTER — Telehealth: Payer: Self-pay

## 2019-06-09 NOTE — Telephone Encounter (Signed)
I let Amy Morrow know that her PAP smear was normal.  She verbalized understanding.

## 2019-06-17 ENCOUNTER — Other Ambulatory Visit (HOSPITAL_COMMUNITY): Payer: Self-pay | Admitting: Endocrinology

## 2019-06-17 ENCOUNTER — Other Ambulatory Visit: Payer: Self-pay | Admitting: Endocrinology

## 2019-06-17 DIAGNOSIS — C73 Malignant neoplasm of thyroid gland: Secondary | ICD-10-CM

## 2019-06-21 ENCOUNTER — Other Ambulatory Visit (HOSPITAL_COMMUNITY): Payer: Self-pay | Admitting: Endocrinology

## 2019-06-21 DIAGNOSIS — C73 Malignant neoplasm of thyroid gland: Secondary | ICD-10-CM

## 2019-07-01 ENCOUNTER — Encounter (HOSPITAL_COMMUNITY)
Admission: RE | Admit: 2019-07-01 | Discharge: 2019-07-01 | Disposition: A | Discharge status: Home / Self Care | Payer: BLUE CROSS/BLUE SHIELD | Source: Ambulatory Visit | Attending: Endocrinology | Admitting: Endocrinology

## 2019-07-01 ENCOUNTER — Other Ambulatory Visit: Payer: Self-pay

## 2019-07-01 DIAGNOSIS — C73 Malignant neoplasm of thyroid gland: Secondary | ICD-10-CM | POA: Insufficient documentation

## 2019-07-01 MED ORDER — SODIUM IODIDE I 131 CAPSULE
132.6000 | Freq: Once | INTRAVENOUS | Status: AC | PRN
  Administered 2019-07-01: 15:00:00 132.6 via ORAL

## 2019-07-08 ENCOUNTER — Encounter (HOSPITAL_COMMUNITY): Admission: RE | Admit: 2019-07-08 | Payer: BLUE CROSS/BLUE SHIELD | Source: Ambulatory Visit

## 2019-07-11 ENCOUNTER — Other Ambulatory Visit: Payer: Self-pay

## 2019-07-11 ENCOUNTER — Encounter (HOSPITAL_COMMUNITY)
Admission: RE | Admit: 2019-07-11 | Discharge: 2019-07-11 | Disposition: A | Discharge status: Home / Self Care | Payer: BLUE CROSS/BLUE SHIELD | Source: Ambulatory Visit | Attending: Endocrinology | Admitting: Endocrinology

## 2019-07-11 DIAGNOSIS — C73 Malignant neoplasm of thyroid gland: Secondary | ICD-10-CM | POA: Diagnosis not present

## 2019-08-10 NOTE — Progress Notes (Signed)
Radiation Oncology         [phone] ________________________________  Name: Amy Morrow MRN: [id number]  Date: 08/15/2019  DOB: Dec 06, 1960  Follow-Up Visit Note  CC: Antony Blackbird, MD  [name], MD    ICD-10-CM   1. Recurrent cervical cancer (HCC)  C53.9     Diagnosis:  Recurrent cervical cancer  Interval Since Last Radiation: Eight months, one week, and six days  Radiation treatment dates:     1. IMRT: 09/27/2018 - 11/01/2018 2. HDR: 11/10/2018, 11/18/2018, 11/24/2018, 12/01/2018  Site/dose:    1. Pelvis, Vagina / 45 Gy in 25 fractions 2. Vaginal Cuff (area of recurrence) / 24 Gy in 4 fractions  Narrative:  The patient returns today for routine follow-up. Communication was with the assistance of the hospital interpretor. The patient is doing well overall.  Since last visit, patient had a PET scan on 03/02/2019 that did not show any evidence of metastatic disease. Previously questioned hypermetabolism along the left vaginal cuff was not appreciated on that study. It also showed a hypermetabolic right thyroid nodule that was biopsied back on 09/23/2018 and was consistent with papillary carcinoma (bethesda category VI).  The patient was seen by Dr. Alvy Bimler on 03/03/2019, during which time she was felt to have had a complete clinical response to her therapy and was transitioned to surveillance. Dr. Alvy Bimler recommended referral to ENT or general surgery for thyroidectomy, which patient did undergo at [facility] with benign findings.  The patient was seen by Dr. Denman George on 06/01/2019, during which time there did not appear to be any clinical evidence of recurrence. PAP smear from that day was negative for intraepithelial lesions or malignancy.  On review of systems, patient reports she is not consistent with using her vaginal dilator. Patient denies abdominal bloating, pelvic pain or vaginal bleeding.                     ALLERGIES:  has No Known  Allergies.  Meds: Current Outpatient Medications  Medication Sig Dispense Refill  . acetaminophen (TYLENOL) 500 MG tablet Take 2 tablets (1,000 mg total) by mouth every 6 (six) hours as needed for moderate pain, fever or headache. 30 tablet 0  . calcitRIOL (ROCALTROL) 0.25 MCG capsule Take by mouth.    . calcium-vitamin D (OSCAL WITH D) 500-200 MG-UNIT TABS tablet Take by mouth.    . clobetasol cream (TEMOVATE) 0.05 % Apply twice a day as needed to affected area Avoid using on the face or neck area    . EUTHYROX 112 MCG tablet Take 112 mcg by mouth daily.    Marland Kitchen levothyroxine (SYNTHROID) 112 MCG tablet Take by mouth.    . metFORMIN (GLUCOPHAGE-XR) 500 MG 24 hr tablet Take by mouth.    . pravastatin (PRAVACHOL) 20 MG tablet Take by mouth.    . tamsulosin (FLOMAX) 0.4 MG CAPS capsule Take by mouth.    . lidocaine-prilocaine (EMLA) cream Apply to affected area once (Patient not taking: Reported on 08/15/2019) 30 g 3  . ondansetron (ZOFRAN) 8 MG tablet Take 1 tablet (8 mg total) by mouth every 8 (eight) hours as needed. Start on the third day after chemotherapy. (Patient not taking: Reported on 08/15/2019) 30 tablet 1  . prochlorperazine (COMPAZINE) 10 MG tablet Take 1 tablet (10 mg total) by mouth every 6 (six) hours as needed (Nausea or vomiting). (Patient not taking: Reported on 08/15/2019) 30 tablet 1  . solifenacin (VESICARE) 10 MG tablet Take by mouth.  No current facility-administered medications for this encounter.    Physical Findings: The patient is in no acute distress. Patient is alert and oriented.  weight is 149 lb 6.4 oz (67.8 kg). Her temperature is 97.8 F (36.6 C). Her blood pressure is 119/77 and her pulse is 92. Her respiration is 20 and oxygen saturation is 100%.   Lungs are clear to auscultation bilaterally. Heart has regular rate and rhythm. No palpable cervical, supraclavicular, or axillary adenopathy. Abdomen soft, non-tender, normal bowel sounds.  On pelvic  examination the external genitalia were unremarkable. A speculum exam was performed. There are no mucosal lesions noted in the vaginal vault. On bimanual and rectovaginal examination there were no pelvic masses appreciated. Mild agglutination of the upper vaginal area which was gently separated  Lab Findings: Lab Results  Component Value Date   WBC 3.1 (L) 03/17/2019   HGB 11.2 (L) 03/17/2019   HCT 34.3 (L) 03/17/2019   MCV 92.5 03/17/2019   PLT 257 03/17/2019    Radiographic Findings: No results found.  Impression:  Recurrent cervical cancer.   No clinical evidence of recurrence.  Encouraged patient to use her vaginal dilator on a regular basis  Plan: Patient will follow up with Dr. Denman George in three months and radiation oncology in six months.   ____________________________________  Blair Promise, PhD, MD  This document serves as a record of services personally performed by Gery Pray, MD. It was created on his behalf by Clerance Lav, a trained medical scribe. The creation of this record is based on the scribe's personal observations and the provider's statements to them. This document has been checked and approved by the attending provider.

## 2019-08-15 ENCOUNTER — Ambulatory Visit
Admission: RE | Admit: 2019-08-15 | Discharge: 2019-08-15 | Disposition: A | Discharge status: Home / Self Care | Payer: BLUE CROSS/BLUE SHIELD | Source: Ambulatory Visit | Attending: Radiation Oncology | Admitting: Radiation Oncology

## 2019-08-15 ENCOUNTER — Encounter: Payer: Self-pay | Admitting: Radiation Oncology

## 2019-08-15 ENCOUNTER — Other Ambulatory Visit: Payer: Self-pay

## 2019-08-15 VITALS — BP 119/77 | HR 92 | Temp 97.8°F | Resp 20 | Wt 149.4 lb

## 2019-08-15 DIAGNOSIS — Z923 Personal history of irradiation: Secondary | ICD-10-CM | POA: Insufficient documentation

## 2019-08-15 DIAGNOSIS — Z79899 Other long term (current) drug therapy: Secondary | ICD-10-CM | POA: Insufficient documentation

## 2019-08-15 DIAGNOSIS — Z8541 Personal history of malignant neoplasm of cervix uteri: Secondary | ICD-10-CM | POA: Insufficient documentation

## 2019-08-15 DIAGNOSIS — C539 Malignant neoplasm of cervix uteri, unspecified: Secondary | ICD-10-CM

## 2019-08-15 DIAGNOSIS — Z7984 Long term (current) use of oral hypoglycemic drugs: Secondary | ICD-10-CM | POA: Insufficient documentation

## 2019-08-15 NOTE — Progress Notes (Signed)
Amy Morrow is here for a  Follow-up appointment.Patient denies any pain. Patient states that she has some fatigue. Patient denies any vaginal or rectal bleeding. Patient denies any vaginal discharge. Patient denies any issues with her bowels. Patient denies any nausea or vomiting . Patient denies  any dysuria. Patient reports nocturia x1. Patient denies any issues with her skin. Vitals:   08/15/19 1008  BP: 119/77  Pulse: 92  Resp: 20  Temp: 97.8 F (36.6 C)  SpO2: 100%  Weight: 149 lb 6.4 oz (67.8 kg)

## 2019-09-06 ENCOUNTER — Telehealth: Payer: Self-pay | Admitting: *Deleted

## 2019-09-06 NOTE — Telephone Encounter (Signed)
CALLED JULIE THE INTREP. TO ASK HER TO CALL THIS PATIENT AND INFORM OF FU APPT. WITH DR. Denman George ON 11-25-19- ARRIVAL TIME- 12:30 PM, SPOKE WITH JULIE AND SHE VERIFIED THAT SHE WOULD CALL THIS PATIENT AND INFORM OF THIS APPT.

## 2019-09-06 NOTE — Telephone Encounter (Signed)
Enid Derry from radiation called and scheduled a follow up appt for April. Enid Derry will contact the patient

## 2019-11-25 ENCOUNTER — Other Ambulatory Visit: Payer: Self-pay

## 2019-11-25 ENCOUNTER — Inpatient Hospital Stay: Payer: BLUE CROSS/BLUE SHIELD | Attending: Gynecologic Oncology | Admitting: Gynecologic Oncology

## 2019-11-25 ENCOUNTER — Encounter: Payer: Self-pay | Admitting: Gynecologic Oncology

## 2019-11-25 VITALS — BP 105/71 | HR 93 | Temp 98.0°F | Resp 17 | Ht 59.0 in | Wt 155.4 lb

## 2019-11-25 DIAGNOSIS — Z9079 Acquired absence of other genital organ(s): Secondary | ICD-10-CM | POA: Diagnosis not present

## 2019-11-25 DIAGNOSIS — Z90710 Acquired absence of both cervix and uterus: Secondary | ICD-10-CM | POA: Diagnosis not present

## 2019-11-25 DIAGNOSIS — Z9221 Personal history of antineoplastic chemotherapy: Secondary | ICD-10-CM | POA: Diagnosis not present

## 2019-11-25 DIAGNOSIS — C539 Malignant neoplasm of cervix uteri, unspecified: Secondary | ICD-10-CM | POA: Diagnosis present

## 2019-11-25 DIAGNOSIS — Z923 Personal history of irradiation: Secondary | ICD-10-CM | POA: Diagnosis not present

## 2019-11-25 DIAGNOSIS — Z90722 Acquired absence of ovaries, bilateral: Secondary | ICD-10-CM | POA: Diagnosis not present

## 2019-11-25 NOTE — Patient Instructions (Signed)
Please notify Dr Denman George at phone number [phone] if you notice vaginal bleeding, new pelvic or abdominal pains, bloating, feeling full easy, or a change in bladder or bowel function.    Please have Dr Clabe Seal office contact Dr Serita Grit office (at [phone]) in July after your appointment with him to request an appointment with her for October, 2021.

## 2019-11-25 NOTE — Progress Notes (Signed)
Follow-up Note: GYN-ONC  Consult was requested by Dr. Darron Doom, MD   CC:  Chief Complaint  Patient presents with  . Recurrent cervical cancer ([place])    Follow up  history of uretero-vaginal fistulae.  Patient was seen with an interpretor.   Assessment/Plan: 1. 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 Ureterovaginal fistula (bilateral)  o S/p repair with Dr Louis Meckel on 06/24/18.  o No residual symptoms  3.   Thyroid cancer  - S/P thyroidectomy  At [facility]  - s/p radioactive iodine  She will see Dr Sondra Come in 3 months and myself in 6 months.   HPI: [name]  is a very nice 59 y.o.  P5  She has been undergoing cervical cancer screening through [place] [facility]. 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 01/2012 LSIL + endometrial cells 07/2012 Pap negative 01/2013 Pap negative with HRHPV not detected 01/2015 Pap negative and HRHPV not detected  Was noting postmenopausal bleeding and saw [place] [facility] again. 12/2017 Pap negative now HRHPV detected - Mass noted on exam and she was referred to the [place] [facility] where Dr. 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   Referred for management. Only complaint is the PMB. Denies pain. States bleeding minimal.  She underwent a PET/CT which showed no apparent metastatic disease. On 02/08/18 she underwent robotic assisted radical hysterectomy, upper vaginectomy, bilateral pelvic lymphadenectomy.  Operative findings were significant for a 2 to 3 cm pedunculated exophytic mass from the right anterior cervix.  There is no clinical involvement of the parametrium and no suspicious nodes.  The surgery was overall fairly uncomplicated there was some increased bleeding encountered during the dissection around the posterior bladder  pillars.  At the completion of the procedure the ureters bilaterally appeared intact and were completely skeletonized in the distal third free from all parametrial tissue.  Patient was readmitted on postop day 7 with bilateral ureterovaginal fistulas is confirmed on both CT urogram, as well as cystoscopy with retrograde pyelography with Dr. Louis Meckel.  During that cystoscopic procedure Dr. Louis Meckel placed ureteral stents bilaterally.  The patient was treated with broad-spectrum antibiotics to cover pelvic infection.  She was discharged after a 3-day hospital stay.  On 03/02/18 she underwent right percutaneous nephrostomy tube placement. Immediately after placement she developed fevers and hypotension and was admitted with sepsis from occult pyelonephritis. She received IVF resuscitation and IV antibiotics. She was discharged on 03/06/18. After placement of the right PCN she noted slight decrease in urinary leakage from the vagina, though still persisted.  Therefore placement of a left PCN was placed on 03/18/18 and this improved urinary leakage from the vagina substantially. She was placed on prophylactic nitrofurantoid 100mg  daily.  She was followed by Dr Louis Meckel who performed ureteroscopy in early October, 2019 and identified resolution of left fistula and almost resolution in right. The stents were replaced with a plan to remove PCN's later this month and then stents if fistulae clinically resolved.  On 06/24/18 Dr Louis Meckel performed robotic assisted bilateral ureteral reimplantation (ureteroneocystotomy). She did well postop.   Postop she reported symptoms of incomplete voiding after emptying her bladder that requires change in position of her pelvis in order to completely empty.   She was seen on 08/27/17 as part of routine scheduled surveillance. She denied vaginal bleeding  symptoms.   On vaginal examiation, a soft exophytic 1cm lesion was identified at the left vaginal fornix. It was biopsied. It  was resulted as squamous cell carcinoma, consistent with recurrence.  PET showed localized disease at the left vaginal fornix and no distant sites (though a thyroid nodule was pet avid).    Interval Hx:  She was treated with salvage chemoradiation between September 27, 2018 and December 01, 2018.  This included 5 cycles of weekly radiosensitizing cisplatin 40 mg per metered squared.  Additionally which she received IMRT with 45 Gray in 25 fractions to the pelvis and vagina between therapy 24th and November 01, 2018, and high-dose-rate vaginal cuff brachytherapy, 24 Gray in 4 fractions, delivered between April 8 and December 01, 2018.  She tolerated therapy well and had no adverse effects.  After completing therapy she underwent thyroidectomy at [facility] hospital and subsequent radioactive iodine.  Post treatment PET was performed (prior to her thyroidectomy) on March 02, 2019.  This revealed persistent focal hypermetabolism in the right lobe of the thyroid, no evidence of metastatic disease, previously questioned hypermetabolism along the left vaginal cuff was not appreciated.  She was felt to have had a complete clinical response to her therapy and transitioned to surveillance.   Current Meds:  Outpatient Encounter Medications as of 11/25/2019  Medication Sig  . acetaminophen (TYLENOL) 500 MG tablet Take 2 tablets (1,000 mg total) by mouth every 6 (six) hours as needed for moderate pain, fever or headache.  . calcitRIOL (ROCALTROL) 0.25 MCG capsule Take by mouth.  . calcium-vitamin D (OSCAL WITH D) 500-200 MG-UNIT TABS tablet Take by mouth.  . clobetasol cream (TEMOVATE) 0.05 % Apply twice a day as needed to affected area Avoid using on the face or neck area  . EUTHYROX 112 MCG tablet Take 112 mcg by mouth daily.  Marland Kitchen levothyroxine (SYNTHROID) 112 MCG tablet Take by mouth.  . lidocaine-prilocaine (EMLA) cream Apply to affected area once  . metFORMIN (GLUCOPHAGE-XR) 500 MG 24 hr tablet Take  by mouth.  . ondansetron (ZOFRAN) 8 MG tablet Take 1 tablet (8 mg total) by mouth every 8 (eight) hours as needed. Start on the third day after chemotherapy.  . pravastatin (PRAVACHOL) 20 MG tablet Take by mouth.  . prochlorperazine (COMPAZINE) 10 MG tablet Take 1 tablet (10 mg total) by mouth every 6 (six) hours as needed (Nausea or vomiting).  . solifenacin (VESICARE) 10 MG tablet Take by mouth.  . tamsulosin (FLOMAX) 0.4 MG CAPS capsule Take by mouth.   No facility-administered encounter medications on file as of 11/25/2019.    Allergy: No Known Allergies  Social Hx:  Tobacco use: none Alcohol use: none Illicit Drug use: none Illicit IV Drug use: none  Past Surgical Hx:  Past Surgical History:  Procedure Laterality Date  . ABDOMINAL HYSTERECTOMY    . BREAST BIOPSY Left 06/15/2012  . CYSTOSCOPY W/ RETROGRADES Bilateral 02/16/2018   Procedure: CYSTOSCOPY WITH RETROGRADE PYELOGRAM BILATERAL DIAGNOSTIC URETEROSCOPY, BILATERAL  STENT PLACEMENT;  Surgeon: Ardis Hughs, MD;  Location: [facility];  Service: Urology;  Laterality: Bilateral;  . CYSTOSCOPY W/ URETERAL STENT PLACEMENT Bilateral 04/22/2018   Procedure: CYSTOSCOPY WITH BILATERAL RETROGRADE PYELOGRAM/ AND URETERAL STENT EXCHANGE, vaginal exam;  Surgeon: Ardis Hughs, MD;  Location[facility];  Service: Urology;  Laterality: Bilateral;  . CYSTOSCOPY W/ URETERAL STENT PLACEMENT Bilateral 05/28/2018   Procedure: CYSTOSCOPY WITH BILATERAL  RETROGRADE PYELOGRAM/URETERAL STENT REPLACEMENT, REMOVAL OF BILATERAL PERCUTANEOUS DRAINS;  Surgeon: Louis Meckel,  Viona Gilmore, MD;  Location: [facility];  Service: Urology;  Laterality: Bilateral;  . D & C LEEP CONIZATION BX  07-31-2003   dr p. rose [facility]  . IR IMAGING GUIDED PORT INSERTION  09/27/2018  . IR NEPHROSTOGRAM RIGHT THRU EXISTING ACCESS  03/18/2018  . IR NEPHROSTOMY EXCHANGE LEFT  04/27/2018  . IR NEPHROSTOMY EXCHANGE RIGHT  04/27/2018  . IR NEPHROSTOMY  PLACEMENT LEFT  03/18/2018  . IR NEPHROSTOMY PLACEMENT RIGHT  03/02/2018  . IR REMOVAL TUN ACCESS W/ PORT W/O FL MOD SED  03/17/2019  . PELVIC LYMPH NODE DISSECTION Bilateral 02/08/2018   Procedure: BILATEAL PEVIC  LYMPHADENECTOMY;  Surgeon: [name], MD;  Location: [facility];  Service: Gynecology;  Laterality: Bilateral;  . ROBOTIC ASSISTED TOTAL HYSTERECTOMY WITH BILATERAL SALPINGO OOPHERECTOMY N/A 02/08/2018   Procedure: XI ROBOTIC ASSISTED TOTAL Radical HYSTERECTOMY WITH BILATERAL SALPINGO OOPHORECTOMY;  Surgeon: [name], MD;  Location: [facility];  Service: Gynecology;  Laterality: N/A;  . Transvaginal tape placement  03-12-2009  dr Emeterio Reeve  [facility]   GYNECARE TENSION-FREE VAGINAL TAPE SLING  . TUBAL LIGATION  05-28-2005  DR  MARSHALL  @ [facility]   PPTL    Past Medical Hx:  Past Medical History:  Diagnosis Date  . Abnormal Pap smear   . Anemia   . Cervical cancer [facility]) oncologist- dr Denman George   Stage IB1  SCCa   . Dyspnea   . Fatigue   . Hyperlipidemia   . Left breast mass 2013   7x4x4 mm 6o'clock  . PONV (postoperative nausea and vomiting)   . Pre-diabetes   . Sepsis ([place]) 03/2018   Urosepsis, Resolved  . Thyroid nodule 03/25/2018   inferior right thyroid, noted on US thyroid  . Ureterovaginal fistula   . Urgency of urination    intermittant  . Urinary frequency     Past Gynecological History:   GYNECOLOGIC HISTORY:  Patient's last menstrual period was 11/16/2012. Menarche: 59 years old P 5 LMP 59 yo Contraceptive 5 years OCP HRT none  Last Pap see HPI  Family Hx:  Family History  Problem Relation Age of Onset  . Hypertension Mother   . Heart disease Mother   . Hypertension Brother   . Heart disease Brother     Review of Systems:  Review of Systems  Constitutional: Negative.   HENT:  Negative.   Eyes: Negative.   Respiratory: Negative.   Cardiovascular: Negative.   Endocrine: Negative.   Genitourinary: Negative.         No leakage of urine from vagina. +  sensation of incomplete emptying.   Musculoskeletal: Negative.   Skin: Negative.   Neurological: Negative.   Hematological: Negative.   Psychiatric/Behavioral: Negative for depression.    Vitals:  Blood pressure 105/71, pulse 93, temperature 98 F (36.7 C), temperature source Temporal, resp. rate 17, height 4\' 11"  (1.499 m), weight 155 lb 6.4 oz (70.5 kg), last menstrual period 11/16/2012, SpO2 100 %. Body mass index is 31.39 kg/m.   Physical Exam: ECOG PERFORMANCE STATUS: 1 - Symptomatic but completely ambulatory   General :  Well developed, 59 y.o., female in no apparent distress HEENT:  Normocephalic/atraumatic, symmetric, EOMI, eyelids normal Neck:   Supple, no masses. Well healed thyroidectomy incision. Lymphatics:  No cervical/ submandibular/ supraclavicular/ infraclavicular/ inguinal adenopathy Respiratory:  Respirations unlabored, no use of accessory muscles CV:   Deferred Breast:  Deferred Musculoskeletal: No CVA tenderness, normal muscle strength. Abdomen:  Soft, non-tender and nondistended. No evidence of  hernia. No masses. Well healed incisions.  Extremities:  No lymphedema, no erythema, non-tender. 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  Neuro/Psych:  No focal motor deficit, no abnormal mental status. Normal gait. Normal affect. Alert and oriented to person, place, and time  Genito Urinary: Speculum exam reveals no blood. No lesions or palpable masses. Upper vaginal agglutination (broken up digitally).  Rectovaginal:  No rectal lesions, mass not appreciated via rectum, no paravaginal infiltration.    Thereasa Solo, MD  11/25/2019, 1:36 PM    Cc: Darron Doom, MD (Referring Ob/Gyn)

## 2020-02-13 ENCOUNTER — Encounter: Payer: Self-pay | Admitting: Radiation Oncology

## 2020-02-13 ENCOUNTER — Other Ambulatory Visit: Payer: Self-pay

## 2020-02-13 ENCOUNTER — Ambulatory Visit
Admission: RE | Admit: 2020-02-13 | Discharge: 2020-02-13 | Disposition: A | Discharge status: Home / Self Care | Payer: BLUE CROSS/BLUE SHIELD | Source: Ambulatory Visit | Attending: Radiation Oncology | Admitting: Radiation Oncology

## 2020-02-13 VITALS — BP 98/74 | HR 91 | Temp 98.7°F | Resp 18 | Ht 59.0 in | Wt 154.8 lb

## 2020-02-13 DIAGNOSIS — Z79899 Other long term (current) drug therapy: Secondary | ICD-10-CM | POA: Diagnosis not present

## 2020-02-13 DIAGNOSIS — Z8541 Personal history of malignant neoplasm of cervix uteri: Secondary | ICD-10-CM | POA: Insufficient documentation

## 2020-02-13 DIAGNOSIS — C539 Malignant neoplasm of cervix uteri, unspecified: Secondary | ICD-10-CM

## 2020-02-13 DIAGNOSIS — Z7984 Long term (current) use of oral hypoglycemic drugs: Secondary | ICD-10-CM | POA: Diagnosis not present

## 2020-02-13 NOTE — Progress Notes (Signed)
Radiation Oncology         [phone] ________________________________  Name: Amy Morrow MRN: [id number]  Date: 02/13/2020  DOB: 04/18/1961  Follow-Up Visit Note  CC: Antony Blackbird, MD  Antony Blackbird, MD    ICD-10-CM   1. Recurrent cervical cancer (HCC)  C53.9   2. Cervical cancer, FIGO stage IB1 (HCC)  C53.9     Diagnosis:  Recurrent cervical cancer  Interval Since Last Radiation: One year, two months, one week, and six days.  Radiation treatment dates:     1. IMRT: 09/27/2018 - 11/01/2018 2. HDR: 11/10/2018, 11/18/2018, 11/24/2018, 12/01/2018  Site/dose:    1. Pelvis, Vagina / 45 Gy in 25 fractions 2. Vaginal Cuff (area of recurrence) / 24 Gy in 4 fractions  Narrative:  The patient returns today for routine follow-up. Communication was with the assistance of the hospital interpretor.  The patient is doing well overall.  Since her last visit, she followed-up with Dr. Crissie Figures at [facility] on 10/28/2019 regarding papillary thyroid cancer. She was noted to be doing well overall with the exception of feeling tired after speaking a lot, phlegm in her throat, and some hoarseness in her voice. She will follow-up again in the near future.  She followed-up with Dr. Denman George on 11/25/2019. At that time, she was noted to have a complete clinical response to chemoradiation.  On review of systems, she reports seeing one drop of blood in her underwear one month ago but has not had any additional bleeding since. She has not been using her vaginal dilator. She denies pelvic pain, vaginal itching/odor/discharge, nausea, vomiting, and change in bowel patterns.  She denies any abdominal bloating or swelling in her lower extremities.  She reports intercourse is being painful but denies any postcoital bleeding                     ALLERGIES:  has No Known Allergies.  Meds: Current Outpatient Medications  Medication Sig Dispense Refill  . calcium-vitamin D (OSCAL  WITH D) 500-200 MG-UNIT TABS tablet Take by mouth.    . EUTHYROX 112 MCG tablet Take 112 mcg by mouth daily.    . metFORMIN (GLUCOPHAGE-XR) 500 MG 24 hr tablet Take by mouth.    . pravastatin (PRAVACHOL) 20 MG tablet Take by mouth.     No current facility-administered medications for this encounter.    Physical Findings: The patient is in no acute distress. Patient is alert and oriented.  height is 4\' 11"  (1.499 m) and weight is 154 lb 12.8 oz (70.2 kg). Her temperature is 98.7 F (37.1 C). Her blood pressure is 98/74 and her pulse is 91. Her respiration is 18 and oxygen saturation is 99%.   Lungs are clear to auscultation bilaterally. Heart has regular rate and rhythm. No palpable cervical, supraclavicular, or axillary adenopathy. Abdomen soft, non-tender, normal bowel sounds.  On pelvic examination the external genitalia were unremarkable. A speculum exam was performed. There are no mucosal lesions noted in the vaginal vault. On bimanual and rectovaginal examination there were no pelvic masses appreciated. Mild agglutination of the upper vaginal area which was gently separated.  Mild Radiation changes at the vaginal cuff.  Lab Findings: Lab Results  Component Value Date   WBC 3.1 (L) 03/17/2019   HGB 11.2 (L) 03/17/2019   HCT 34.3 (L) 03/17/2019   MCV 92.5 03/17/2019   PLT 257 03/17/2019    Radiographic Findings: No results found.  Impression:  Recurrent  cervical cancer  No clinical evidence of recurrence on exam today.  The patient has been inconsistent with using her vaginal dilator due to pain with use.  We have given her additional vaginal dilator smaller in diameter to use  Plan: Patient will follow up with Dr. Denman George in three months and radiation oncology in six months. She will also continue to follow-up with [facility] Tamala Julian [facility] regarding her history of thyroid cancer.  Total time spent in this encounter was 25 minutes which included reviewing  the patient's most recent follow-ups, physical examination, and documentation  and discussion with the assistance of an interpreter.  ____________________________________  Blair Promise, PhD, MD  This document serves as a record of services personally performed by Gery Pray, MD. It was created on his behalf by Clerance Lav, a trained medical scribe. The creation of this record is based on the scribe's personal observations and the provider's statements to them. This document has been checked and approved by the attending provider.

## 2020-02-13 NOTE — Progress Notes (Signed)
Weight and vitals stable. Denies pain. Reports one month ago she saw a drop of blood in her underwear but hasn't seen any since. Denies vaginal itching, odor or discharge. Reports she has stopped using her vaginal dilator. Patient reports having very little sexual activity. Denies nausea or vomiting. Denies any bowel complaints. Denies any additional concerns.   BP 98/74   Pulse 91   Temp 98.7 F (37.1 C)   Resp 18   Ht 4\' 11"  (1.499 m)   Wt 154 lb 12.8 oz (70.2 kg)   LMP 11/16/2012   SpO2 99%   BMI 31.27 kg/m  Wt Readings from Last 3 Encounters:  02/13/20 154 lb 12.8 oz (70.2 kg)  11/25/19 155 lb 6.4 oz (70.5 kg)  08/15/19 149 lb 6.4 oz (67.8 kg)

## 2020-04-11 ENCOUNTER — Telehealth: Payer: Self-pay | Admitting: *Deleted

## 2020-04-11 NOTE — Telephone Encounter (Signed)
Called and scheduled the patient for a follow up appt in October with Dr Denman George

## 2020-05-15 ENCOUNTER — Inpatient Hospital Stay: Payer: Self-pay | Attending: Gynecologic Oncology | Admitting: Gynecologic Oncology

## 2020-05-15 ENCOUNTER — Other Ambulatory Visit: Payer: Self-pay

## 2020-05-15 ENCOUNTER — Encounter: Payer: Self-pay | Admitting: Gynecologic Oncology

## 2020-05-15 VITALS — BP 112/63 | HR 91 | Temp 98.8°F | Resp 18 | Wt 151.6 lb

## 2020-05-15 DIAGNOSIS — Z90710 Acquired absence of both cervix and uterus: Secondary | ICD-10-CM | POA: Insufficient documentation

## 2020-05-15 DIAGNOSIS — Z90722 Acquired absence of ovaries, bilateral: Secondary | ICD-10-CM | POA: Insufficient documentation

## 2020-05-15 DIAGNOSIS — Z9221 Personal history of antineoplastic chemotherapy: Secondary | ICD-10-CM

## 2020-05-15 DIAGNOSIS — Z923 Personal history of irradiation: Secondary | ICD-10-CM

## 2020-05-15 DIAGNOSIS — Z7984 Long term (current) use of oral hypoglycemic drugs: Secondary | ICD-10-CM | POA: Insufficient documentation

## 2020-05-15 DIAGNOSIS — Z79899 Other long term (current) drug therapy: Secondary | ICD-10-CM | POA: Insufficient documentation

## 2020-05-15 DIAGNOSIS — Z9079 Acquired absence of other genital organ(s): Secondary | ICD-10-CM | POA: Insufficient documentation

## 2020-05-15 DIAGNOSIS — Z85850 Personal history of malignant neoplasm of thyroid: Secondary | ICD-10-CM | POA: Insufficient documentation

## 2020-05-15 DIAGNOSIS — C539 Malignant neoplasm of cervix uteri, unspecified: Secondary | ICD-10-CM

## 2020-05-15 DIAGNOSIS — E119 Type 2 diabetes mellitus without complications: Secondary | ICD-10-CM | POA: Insufficient documentation

## 2020-05-15 NOTE — Patient Instructions (Signed)
Please return to see Dr Sondra Come in January as scheduled.  Please have Dr Clabe Seal office contact Dr Serita Grit office (at [phone]) in January after your appointment with him to request an appointment with Dr Denman George for April, 2022.

## 2020-05-15 NOTE — Progress Notes (Signed)
Follow-up Note: GYN-ONC  Consult was requested by Dr. Darron Doom, MD   CC:  Chief Complaint  Patient presents with  . Recurrent cervical cancer  history of uretero-vaginal fistulae.  Patient was seen with an interpretor.   Assessment/Plan: 1. 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 Ureterovaginal fistula (bilateral)  o S/p repair with Dr Louis Meckel on 06/24/18.  o No residual symptoms  3.   Thyroid cancer  - S/P thyroidectomy  At [facility].  - s/p radioactive iodine  She will see Dr Sondra Come in 3 months and myself in 6 months.   HPI: Amy Morrow  is a very nice 59 y.o.  P5  She has been undergoing cervical cancer screening through [facility]. 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 01/2012 LSIL + endometrial cells 07/2012 Pap negative 01/2013 Pap negative with HRHPV not detected 01/2015 Pap negative and HRHPV not detected  Was noting postmenopausal bleeding and saw [facility] [place] [facility] again. 12/2017 Pap negative now HRHPV detected - Mass noted on exam and she was referred to the [place] [facility] where Dr. 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   Referred for management. Only complaint is the PMB. Denies pain. States bleeding minimal.  She underwent a PET/CT which showed no apparent metastatic disease. On 02/08/18 she underwent robotic assisted radical hysterectomy, upper vaginectomy, bilateral pelvic lymphadenectomy.  Operative findings were significant for a 2 to 3 cm pedunculated exophytic mass from the right anterior cervix.  There is no clinical involvement of the parametrium and no suspicious nodes.  The surgery was overall fairly uncomplicated there was some increased bleeding encountered during the dissection around the posterior bladder pillars.  At the completion of the procedure the  ureters bilaterally appeared intact and were completely skeletonized in the distal third free from all parametrial tissue.  Patient was readmitted on postop day 7 with bilateral ureterovaginal fistulas is confirmed on both CT urogram, as well as cystoscopy with retrograde pyelography with Dr. Louis Meckel.  During that cystoscopic procedure Dr. Louis Meckel placed ureteral stents bilaterally.  The patient was treated with broad-spectrum antibiotics to cover pelvic infection.  She was discharged after a 3-day hospital stay.  On 03/02/18 she underwent right percutaneous nephrostomy tube placement. Immediately after placement she developed fevers and hypotension and was admitted with sepsis from occult pyelonephritis. She received IVF resuscitation and IV antibiotics. She was discharged on 03/06/18. After placement of the right PCN she noted slight decrease in urinary leakage from the vagina, though still persisted.  Therefore placement of a left PCN was placed on 03/18/18 and this improved urinary leakage from the vagina substantially. She was placed on prophylactic nitrofurantoid 100mg  daily.  She was followed by Dr Louis Meckel who performed ureteroscopy in early October, 2019 and identified resolution of left fistula and almost resolution in right. The stents were replaced with a plan to remove PCN's later this month and then stents if fistulae clinically resolved.  On 06/24/18 Dr Louis Meckel performed robotic assisted bilateral ureteral reimplantation (ureteroneocystotomy). She did well postop.   Postop she reported symptoms of incomplete voiding after emptying her bladder that requires change in position of her pelvis in order to completely empty.   She was seen on 08/27/17 as part of routine scheduled surveillance. She denied vaginal bleeding symptoms.   On vaginal examiation, a soft exophytic 1cm  lesion was identified at the left vaginal fornix. It was biopsied. It was resulted as squamous cell carcinoma,  consistent with recurrence.  PET showed localized disease at the left vaginal fornix and no distant sites (though a thyroid nodule was pet avid).    Interval Hx:  She was treated with salvage chemoradiation between September 27, 2018 and December 01, 2018.  This included 5 cycles of weekly radiosensitizing cisplatin 40 mg per metered squared.  Additionally which she received IMRT with 45 Gray in 25 fractions to the pelvis and vagina between therapy 24th and November 01, 2018, and high-dose-rate vaginal cuff brachytherapy, 24 Gray in 4 fractions, delivered between April 8 and December 01, 2018.  She tolerated therapy well and had no adverse effects.  After completing therapy she underwent thyroidectomy at [facility] hospital and subsequent radioactive iodine.  Post treatment PET was performed (prior to her thyroidectomy) on March 02, 2019.  This revealed persistent focal hypermetabolism in the right lobe of the thyroid, no evidence of metastatic disease, previously questioned hypermetabolism along the left vaginal cuff was not appreciated.  She was felt to have had a complete clinical response to her therapy and transitioned to surveillance.   Current Meds:  Outpatient Encounter Medications as of 05/15/2020  Medication Sig  . calcium-vitamin D (OSCAL WITH D) 500-200 MG-UNIT TABS tablet Take by mouth.  . EUTHYROX 112 MCG tablet Take 112 mcg by mouth daily.  . metFORMIN (GLUCOPHAGE-XR) 500 MG 24 hr tablet Take 500 mg by mouth daily with breakfast.   . pravastatin (PRAVACHOL) 20 MG tablet Take by mouth.  . calcitRIOL (ROCALTROL) 0.25 MCG capsule Take 0.25 mcg by mouth 2 (two) times daily.   No facility-administered encounter medications on file as of 05/15/2020.    Allergy: No Known Allergies  Social Hx:  Tobacco use: none Alcohol use: none Illicit Drug use: none Illicit IV Drug use: none  Past Surgical Hx:  Past Surgical History:  Procedure Laterality Date  . ABDOMINAL HYSTERECTOMY     . BREAST BIOPSY Left 06/15/2012  . CYSTOSCOPY W/ RETROGRADES Bilateral 02/16/2018   Procedure: CYSTOSCOPY WITH RETROGRADE PYELOGRAM BILATERAL DIAGNOSTIC URETEROSCOPY, BILATERAL  STENT PLACEMENT;  Surgeon: Ardis Hughs, MD;  Location: [facility];  Service: Urology;  Laterality: Bilateral;  . CYSTOSCOPY W/ URETERAL STENT PLACEMENT Bilateral 04/22/2018   Procedure: CYSTOSCOPY WITH BILATERAL RETROGRADE PYELOGRAM/ AND URETERAL STENT EXCHANGE, vaginal exam;  Surgeon: Ardis Hughs, MD;  Location[facility];  Service: Urology;  Laterality: Bilateral;  . CYSTOSCOPY W/ URETERAL STENT PLACEMENT Bilateral 05/28/2018   Procedure: CYSTOSCOPY WITH BILATERAL  RETROGRADE PYELOGRAM/URETERAL STENT REPLACEMENT, REMOVAL OF BILATERAL PERCUTANEOUS DRAINS;  Surgeon: Ardis Hughs, MD;  Location: [facility];  Service: Urology;  Laterality: Bilateral;  . D & C LEEP CONIZATION BX  07-31-2003   dr p. rose [facility]  . IR IMAGING GUIDED PORT INSERTION  09/27/2018  . IR NEPHROSTOGRAM RIGHT THRU EXISTING ACCESS  03/18/2018  . IR NEPHROSTOMY EXCHANGE LEFT  04/27/2018  . IR NEPHROSTOMY EXCHANGE RIGHT  04/27/2018  . IR NEPHROSTOMY PLACEMENT LEFT  03/18/2018  . IR NEPHROSTOMY PLACEMENT RIGHT  03/02/2018  . IR REMOVAL TUN ACCESS W/ PORT W/O FL MOD SED  03/17/2019  . PELVIC LYMPH NODE DISSECTION Bilateral 02/08/2018   Procedure: BILATEAL PEVIC  LYMPHADENECTOMY;  Surgeon: [name], MD;  [facility];  Service: Gynecology;  Laterality: Bilateral;  . ROBOTIC ASSISTED TOTAL HYSTERECTOMY WITH BILATERAL SALPINGO OOPHERECTOMY N/A 02/08/2018   Procedure: XI  ROBOTIC ASSISTED TOTAL Radical HYSTERECTOMY WITH BILATERAL SALPINGO OOPHORECTOMY;  Surgeon: [name], MD;  [facility];  Service: Gynecology;  Laterality: N/A;  . Transvaginal tape placement  03-12-2009  dr Emeterio Reeve  [facility]   GYNECARE TENSION-FREE VAGINAL TAPE SLING  . TUBAL LIGATION  05-28-2005  DR  MARSHALL  @ [facility]   PPTL    Past  Medical Hx:  Past Medical History:  Diagnosis Date  . Abnormal Pap smear   . Anemia   . Cervical cancer [facility]) oncologist- dr Denman George   Stage IB1  SCCa   . Dyspnea   . Fatigue   . Hyperlipidemia   . Left breast mass 2013   7x4x4 mm 6o'clock  . PONV (postoperative nausea and vomiting)   . Pre-diabetes   . Sepsis ([place]) 03/2018   Urosepsis, Resolved  . Thyroid nodule 03/25/2018   inferior right thyroid, noted on US thyroid  . Ureterovaginal fistula   . Urgency of urination    intermittant  . Urinary frequency     Past Gynecological History:   GYNECOLOGIC HISTORY:  Patient's last menstrual period was 11/16/2012. Menarche: 58 years old P 5 LMP 59 yo Contraceptive 5 years OCP HRT none  Last Pap see HPI  Family Hx:  Family History  Problem Relation Age of Onset  . Hypertension Mother   . Heart disease Mother   . Hypertension Brother   . Heart disease Brother     Review of Systems:  Review of Systems  Constitutional: Negative.   HENT:  Negative.   Eyes: Negative.   Respiratory: Negative.   Cardiovascular: Negative.   Endocrine: Negative.   Genitourinary: Negative.         No leakage of urine from vagina. + sensation of incomplete emptying.   Musculoskeletal: Negative.   Skin: Negative.   Neurological: Negative.   Hematological: Negative.   Psychiatric/Behavioral: Negative for depression.    Vitals:  Blood pressure 112/63, pulse 91, temperature 98.8 F (37.1 C), temperature source Tympanic, resp. rate 18, weight 151 lb 9.6 oz (68.8 kg), last menstrual period 11/16/2012, SpO2 99 %. Body mass index is 30.62 kg/m.   Physical Exam: ECOG PERFORMANCE STATUS: 1 - Symptomatic but completely ambulatory   General :  Well developed, 59 y.o., female in no apparent distress HEENT:  Normocephalic/atraumatic, symmetric, EOMI, eyelids normal Neck:   Supple, no masses. Well healed thyroidectomy incision. Lymphatics:  No cervical/ submandibular/ supraclavicular/  infraclavicular/ inguinal adenopathy Respiratory:  Respirations unlabored, no use of accessory muscles CV:   Deferred Breast:  Deferred Musculoskeletal: No CVA tenderness, normal muscle strength. Abdomen:  Soft, non-tender and nondistended. No evidence of hernia. No masses. Well healed incisions.  Extremities:  No lymphedema, no erythema, non-tender. 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  Neuro/Psych:  No focal motor deficit, no abnormal mental status. Normal gait. Normal affect. Alert and oriented to person, place, and time  Genito Urinary: Speculum exam reveals no blood. No lesions or palpable masses. Shortened vagina.  Rectovaginal:  No rectal lesions, mass not appreciated via rectum, no paravaginal infiltration.    Thereasa Solo, MD  05/15/2020, 3:21 PM    Cc: Darron Doom, MD (Referring Ob/Gyn)

## 2020-08-14 ENCOUNTER — Telehealth: Payer: Self-pay | Admitting: *Deleted

## 2020-08-14 NOTE — Telephone Encounter (Signed)
xxxxx 

## 2020-08-14 NOTE — Telephone Encounter (Signed)
Called Almyra Free the interp. To ask her to call this patient and let her know that Dr. Sondra Come will be in the OR on 08-16-20 and her appt. has been rescheduled for 08-20-20 @ 4:15 pm, waiting for Almyra Free to confirm that this is ok.

## 2020-08-16 ENCOUNTER — Encounter (HOSPITAL_COMMUNITY): Payer: Self-pay | Admitting: Emergency Medicine

## 2020-08-16 ENCOUNTER — Ambulatory Visit: Payer: Self-pay | Admitting: Radiation Oncology

## 2020-08-16 ENCOUNTER — Other Ambulatory Visit: Payer: Self-pay

## 2020-08-16 ENCOUNTER — Ambulatory Visit (HOSPITAL_COMMUNITY)
Admission: EM | Admit: 2020-08-16 | Discharge: 2020-08-16 | Disposition: A | Discharge status: Home / Self Care | Payer: 59 | Attending: Urgent Care | Admitting: Urgent Care

## 2020-08-16 DIAGNOSIS — J029 Acute pharyngitis, unspecified: Secondary | ICD-10-CM | POA: Diagnosis present

## 2020-08-16 DIAGNOSIS — U071 COVID-19: Secondary | ICD-10-CM | POA: Diagnosis not present

## 2020-08-16 DIAGNOSIS — Z20822 Contact with and (suspected) exposure to covid-19: Secondary | ICD-10-CM

## 2020-08-16 DIAGNOSIS — B349 Viral infection, unspecified: Secondary | ICD-10-CM

## 2020-08-16 DIAGNOSIS — R059 Cough, unspecified: Secondary | ICD-10-CM | POA: Diagnosis present

## 2020-08-16 MED ORDER — PROMETHAZINE-DM 6.25-15 MG/5ML PO SYRP: 5.0000 mL | ORAL_SOLUTION | Freq: Every evening | ORAL | 0 refills | Status: DC | PRN

## 2020-08-16 MED ORDER — CETIRIZINE HCL 10 MG PO TABS: 10.0000 mg | ORAL_TABLET | Freq: Every day | ORAL | 0 refills | Status: DC

## 2020-08-16 MED ORDER — PSEUDOEPHEDRINE HCL 60 MG PO TABS: 60.0000 mg | ORAL_TABLET | Freq: Three times a day (TID) | ORAL | 0 refills | Status: DC | PRN

## 2020-08-16 MED ORDER — BENZONATATE 100 MG PO CAPS: 100.0000 mg | ORAL_CAPSULE | Freq: Three times a day (TID) | ORAL | 0 refills | Status: DC | PRN

## 2020-08-16 NOTE — ED Provider Notes (Signed)
[facility]   MRN: [id number] DOB: 21-May-1961  Subjective:   Amy Morrow is a 60 y.o. female presenting for 1 week history of persistent throat pain, cough, body aches. Had very close exposure to 2 of her brothers that tested positive for COVID 19. She is vaccinated against COVID 19. Denies history of lung disorders.   No current facility-administered medications for this encounter.  Current Outpatient Medications:  .  calcitRIOL (ROCALTROL) 0.25 MCG capsule, Take 0.25 mcg by mouth 2 (two) times daily., Disp: , Rfl:  .  calcium-vitamin D (OSCAL WITH D) 500-200 MG-UNIT TABS tablet, Take by mouth., Disp: , Rfl:  .  EUTHYROX 112 MCG tablet, Take 112 mcg by mouth daily., Disp: , Rfl:  .  metFORMIN (GLUCOPHAGE-XR) 500 MG 24 hr tablet, Take 500 mg by mouth daily with breakfast. , Disp: , Rfl:  .  pravastatin (PRAVACHOL) 20 MG tablet, Take by mouth., Disp: , Rfl:    No Known Allergies  Past Medical History:  Diagnosis Date  . Abnormal Pap smear   . Anemia   . Cervical cancer [facility]) oncologist- dr Denman George   Stage IB1  SCCa   . Dyspnea   . Fatigue   . Hyperlipidemia   . Left breast mass 2013   7x4x4 mm 6o'clock  . PONV (postoperative nausea and vomiting)   . Pre-diabetes   . Sepsis ([place]) 03/2018   Urosepsis, Resolved  . Thyroid nodule 03/25/2018   inferior right thyroid, noted on US thyroid  . Ureterovaginal fistula   . Urgency of urination    intermittant  . Urinary frequency      Past Surgical History:  Procedure Laterality Date  . ABDOMINAL HYSTERECTOMY    . BREAST BIOPSY Left 06/15/2012  . CYSTOSCOPY W/ RETROGRADES Bilateral 02/16/2018   Procedure: CYSTOSCOPY WITH RETROGRADE PYELOGRAM BILATERAL DIAGNOSTIC URETEROSCOPY, BILATERAL  STENT PLACEMENT;  Surgeon: Ardis Hughs, MD;  [facility];  Service: Urology;  Laterality: Bilateral;  . CYSTOSCOPY W/ URETERAL STENT PLACEMENT Bilateral 04/22/2018   Procedure: CYSTOSCOPY WITH BILATERAL RETROGRADE  PYELOGRAM/ AND URETERAL STENT EXCHANGE, vaginal exam;  Surgeon: Ardis Hughs, MD;  Location: [facility];  Service: Urology;  Laterality: Bilateral;  . CYSTOSCOPY W/ URETERAL STENT PLACEMENT Bilateral 05/28/2018   Procedure: CYSTOSCOPY WITH BILATERAL  RETROGRADE PYELOGRAM/URETERAL STENT REPLACEMENT, REMOVAL OF BILATERAL PERCUTANEOUS DRAINS;  Surgeon: Ardis Hughs, MD;  Location: [facility];  Service: Urology;  Laterality: Bilateral;  . D & C LEEP CONIZATION BX  07-31-2003   dr p. rose [facility]  . IR IMAGING GUIDED PORT INSERTION  09/27/2018  . IR NEPHROSTOGRAM RIGHT THRU EXISTING ACCESS  03/18/2018  . IR NEPHROSTOMY EXCHANGE LEFT  04/27/2018  . IR NEPHROSTOMY EXCHANGE RIGHT  04/27/2018  . IR NEPHROSTOMY PLACEMENT LEFT  03/18/2018  . IR NEPHROSTOMY PLACEMENT RIGHT  03/02/2018  . IR REMOVAL TUN ACCESS W/ PORT W/O FL MOD SED  03/17/2019  . PELVIC LYMPH NODE DISSECTION Bilateral 02/08/2018   Procedure: BILATEAL PEVIC  LYMPHADENECTOMY;  Surgeon: [name], MD;  [facility];  Service: Gynecology;  Laterality: Bilateral;  . ROBOTIC ASSISTED TOTAL HYSTERECTOMY WITH BILATERAL SALPINGO OOPHERECTOMY N/A 02/08/2018   Procedure: XI ROBOTIC ASSISTED TOTAL Radical HYSTERECTOMY WITH BILATERAL SALPINGO OOPHORECTOMY;  Surgeon: [name], MD;  [facility];  Service: Gynecology;  Laterality: N/A;  . Transvaginal tape placement  03-12-2009  dr Emeterio Reeve  [facility]   GYNECARE TENSION-FREE VAGINAL TAPE SLING  . TUBAL LIGATION  05-28-2005  DR  MARSHALL  @ [place]   PPTL    Family History  Problem Relation Age of Onset  . Hypertension Mother   . Heart disease Mother   . Hypertension Brother   . Heart disease Brother     Social History   Tobacco Use  . Smoking status: Never Smoker  . Smokeless tobacco: Never Used  Vaping Use  . Vaping Use: Never used  Substance Use Topics  . Alcohol use: No  . Drug use: No    ROS   Objective:   Vitals: BP 136/90 (BP Location:  Right Arm)   Pulse 100   Temp 98.1 F (36.7 C) (Oral)   Resp 17   LMP 11/16/2012   SpO2 98%   Physical Exam Constitutional:      General: She is not in acute distress.    Appearance: Normal appearance. She is well-developed. She is not ill-appearing, toxic-appearing or diaphoretic.  HENT:     Head: Normocephalic and atraumatic.     Nose: Nose normal.     Mouth/Throat:     Mouth: Mucous membranes are moist.     Pharynx: Oropharynx is clear. No oropharyngeal exudate or posterior oropharyngeal erythema.  Eyes:     General: No scleral icterus.       Right eye: No discharge.        Left eye: No discharge.     Extraocular Movements: Extraocular movements intact.     Conjunctiva/sclera: Conjunctivae normal.     Pupils: Pupils are equal, round, and reactive to light.  Cardiovascular:     Rate and Rhythm: Normal rate and regular rhythm.     Pulses: Normal pulses.     Heart sounds: Normal heart sounds. No murmur heard. No friction rub. No gallop.   Pulmonary:     Effort: Pulmonary effort is normal. No respiratory distress.     Breath sounds: Normal breath sounds. No stridor. No wheezing, rhonchi or rales.  Skin:    General: Skin is warm and dry.     Findings: No rash.  Neurological:     Mental Status: She is alert and oriented to person, place, and time.  Psychiatric:        Mood and Affect: Mood normal.        Behavior: Behavior normal.        Thought Content: Thought content normal.        Judgment: Judgment normal.     Assessment and Plan :   PDMP not reviewed this encounter.  1. Viral syndrome   2. Cough   3. Sore throat   4. Close exposure to COVID-19 virus     Will manage for viral illness such as viral URI, viral syndrome, viral rhinitis, COVID-19. Counseled patient on nature of COVID-19 including modes of transmission, diagnostic testing, management and supportive care.  Offered scripts for symptomatic relief. COVID 19 testing is pending. Counseled patient on  potential for adverse effects with medications prescribed/recommended today, ER and return-to-clinic precautions discussed, patient verbalized understanding.     Jaynee Eagles, [place] 08/16/20 1931

## 2020-08-16 NOTE — ED Triage Notes (Signed)
Pt presents with sore throat and cough xs 1 week. States exposed to COVID positive family members.

## 2020-08-16 NOTE — Discharge Instructions (Addendum)
Para el dolor de garganta o tos puede usar un t de miel. Use 3 cucharaditas de miel con jugo exprimido de [employer]. Coloque trozos de Pension scheme manager en 1/2-1 taza de agua y caliente sobre la estufa. Luego mezcle los ingredientes y repita cada 4 horas. Para fiebre, dolores de cuerpo tome ibuprofeno 400mg -600mg  con comida cada 6 horas alternando con o junto con Tylenol 500mg -650mg  cada 6 horas. Hidrata muy bien con al menos 2 litros (64 onzas) de agua al dia. Coma comidas ligeras como sopas para [employer] y nutricion. Tambien puede tomar suero. Comience un antihistamnico como Zyrtec (cetirizina) 10mg  al dia. Puede usar pseudoefedrina (Sudafed) de venta libre para el goteo posnasal, congestin a una dosis de 60 mg cada 8 horas o cada 12 horas. Use el jarabe por la noche para su tos y las capsulas durante el dia.

## 2020-08-17 LAB — SARS CORONAVIRUS 2 (TAT 6-24 HRS): SARS Coronavirus 2: POSITIVE — AB

## 2020-08-20 ENCOUNTER — Ambulatory Visit: Payer: Self-pay | Admitting: Radiation Oncology

## 2020-09-06 ENCOUNTER — Ambulatory Visit
Admission: RE | Admit: 2020-09-06 | Discharge: 2020-09-06 | Disposition: A | Discharge status: Home / Self Care | Payer: 59 | Source: Ambulatory Visit | Attending: Radiation Oncology | Admitting: Radiation Oncology

## 2020-09-06 ENCOUNTER — Encounter: Payer: Self-pay | Admitting: Radiation Oncology

## 2020-09-06 ENCOUNTER — Other Ambulatory Visit: Payer: Self-pay

## 2020-09-06 DIAGNOSIS — Z8541 Personal history of malignant neoplasm of cervix uteri: Secondary | ICD-10-CM | POA: Insufficient documentation

## 2020-09-06 DIAGNOSIS — Z85850 Personal history of malignant neoplasm of thyroid: Secondary | ICD-10-CM | POA: Insufficient documentation

## 2020-09-06 DIAGNOSIS — R5383 Other fatigue: Secondary | ICD-10-CM | POA: Diagnosis not present

## 2020-09-06 DIAGNOSIS — Z7984 Long term (current) use of oral hypoglycemic drugs: Secondary | ICD-10-CM | POA: Insufficient documentation

## 2020-09-06 DIAGNOSIS — Z923 Personal history of irradiation: Secondary | ICD-10-CM | POA: Insufficient documentation

## 2020-09-06 DIAGNOSIS — U071 COVID-19: Secondary | ICD-10-CM | POA: Diagnosis not present

## 2020-09-06 DIAGNOSIS — C539 Malignant neoplasm of cervix uteri, unspecified: Secondary | ICD-10-CM

## 2020-09-06 NOTE — Progress Notes (Signed)
Patient is here today for follow up to cervical area completed April 2020.  Patient denies having any pain or discomfort.  Denies diarrhea/constipation.  Denies bladder problems.  Denies vaginal discharge.  Appetite is good.  States she does have fatigue.  Patient reports she is sexually active at least 2-3 times week.    Vitals:   09/06/20 1001  BP: 118/83  Pulse: 86  Resp: 18  Temp: 98.6 F (37 C)  SpO2: 100%  Weight: 149 lb (67.6 kg)

## 2020-09-06 NOTE — Progress Notes (Signed)
Radiation Oncology         [phone]) [phone] ________________________________  Name: Amy Morrow MRN: [id number]  Date: 09/06/2020  DOB: June 08, 1961  Follow-Up Visit Note  CC: Amy Blackbird, MD (Inactive)  Amy Blackbird, MD    ICD-10-CM   1. Cervical cancer, FIGO stage IB2 (HCC)  C53.9     Diagnosis:  Recurrent cervical cancer   Interval Since Last Radiation: One year, nine months, and five days  Radiation treatment dates:     1. IMRT: 09/27/2018 - 11/01/2018 2. HDR: 11/10/2018, 11/18/2018, 11/24/2018, 12/01/2018  Site/dose:    1. Pelvis, Vagina / 45 Gy in 25 fractions 2. Vaginal Cuff (area of recurrence) / 24 Gy in 4 fractions  Narrative:  The patient returns today for routine follow-up. Communication was with the assistance of the hospital interpretor.  The patient is doing well overall.  Since her last visit, she followed-up with Dr. Denman George on 05/15/2020, during which time she was felt to have had a complete clinical response to there therapy and transitioned to surveillance.   Of note, the patient was seen at Urgent Care on 08/16/2020 and was diagnosed with viral syndrome, cough, sore throat, and close exposure to COVID-19 virus.  She reports testing positive for the COVID-19 infection however did not have any symptoms.  She reports several family members being positive and one of her brothers remains in the hospital for management of this issue.  On review of systems, she reports fatigue. She also reports having a good appetite and being sexually active 2-3 times per week. She denies pain, diarrhea, constipation, bladder problems, and vaginal discharge.  She denies any postcoital bleeding, minimal discomfort with intercourse.                     ALLERGIES:  has No Known Allergies.  Meds: Current Outpatient Medications  Medication Sig Dispense Refill  . calcitRIOL (ROCALTROL) 0.25 MCG capsule Take 0.25 mcg by mouth 2 (two) times daily.    . calcium-vitamin D (OSCAL WITH D)  500-200 MG-UNIT TABS tablet Take by mouth.    . EUTHYROX 112 MCG tablet Take 112 mcg by mouth daily.    . metFORMIN (GLUCOPHAGE-XR) 500 MG 24 hr tablet Take 500 mg by mouth daily with breakfast.     . pravastatin (PRAVACHOL) 20 MG tablet Take by mouth.    . promethazine-dextromethorphan (PROMETHAZINE-DM) 6.25-15 MG/5ML syrup Take 5 mLs by mouth at bedtime as needed for cough. 100 mL 0   No current facility-administered medications for this encounter.    Physical Findings: The patient is in no acute distress. Patient is alert and oriented.  weight is 149 lb (67.6 kg). Her temperature is 98.6 F (37 C). Her blood pressure is 118/83 and her pulse is 86. Her respiration is 18 and oxygen saturation is 100%.   Lungs are clear to auscultation bilaterally. Heart has regular rate and rhythm. No palpable cervical, supraclavicular, or axillary adenopathy. Abdomen soft, non-tender, normal bowel sounds.  On pelvic examination the external genitalia were unremarkable. A speculum exam was performed. There are no mucosal lesions noted in the vaginal vault. On bimanual and rectovaginal examination there were no pelvic masses appreciated.  Mild Radiation changes at the vaginal cuff.   Lab Findings: Lab Results  Component Value Date   WBC 3.1 (L) 03/17/2019   HGB 11.2 (L) 03/17/2019   HCT 34.3 (L) 03/17/2019   MCV 92.5 03/17/2019   PLT 257 03/17/2019    Radiographic Findings: No results  found.  Impression:  Recurrent cervical cancer  No clinical evidence of recurrence on exam today.   Plan: Patient will follow up with Dr. Denman George in three months and radiation oncology in six months. She will also continue to follow-up with Dr. Tamala Julian at [facility] regarding her history of thyroid cancer.  Later this year she can be transition to every 19-month follow-up.  She will be due for Pap smear in October  Total time spent in this encounter was 25 minutes which included reviewing the  patient's most recent follow-ups, Urgent Care visit, physical examination, and documentation  and discussion with the assistance of an interpreter.  ____________________________________  Blair Promise, PhD, MD  This document serves as a record of services personally performed by Gery Pray, MD. It was created on his behalf by Clerance Lav, a trained medical scribe. The creation of this record is based on the scribe's personal observations and the provider's statements to them. This document has been checked and approved by the attending provider.

## 2020-10-23 ENCOUNTER — Telehealth: Payer: Self-pay | Admitting: *Deleted

## 2020-10-23 NOTE — Telephone Encounter (Signed)
CALLED JULIE THE INTREP. TO INFORM OF FU WITH DR. Denman George ON 12-24-20 - ARRIVAL TIME- 2 PM, LVM FOR A RETURN CALL

## 2020-10-23 NOTE — Telephone Encounter (Signed)
Enid Derry from radiation called and scheduled the patient for a follow up appt in April. Enid Derry will contact the patient

## 2020-11-20 ENCOUNTER — Encounter (HOSPITAL_COMMUNITY): Payer: Self-pay

## 2020-11-20 ENCOUNTER — Other Ambulatory Visit: Payer: Self-pay

## 2020-11-20 ENCOUNTER — Ambulatory Visit (HOSPITAL_COMMUNITY)
Admission: EM | Admit: 2020-11-20 | Discharge: 2020-11-20 | Disposition: A | Discharge status: Home / Self Care | Payer: 59 | Attending: Physician Assistant | Admitting: Physician Assistant

## 2020-11-20 DIAGNOSIS — N390 Urinary tract infection, site not specified: Secondary | ICD-10-CM | POA: Insufficient documentation

## 2020-11-20 LAB — POCT URINALYSIS DIPSTICK, ED / UC
Bilirubin Urine: NEGATIVE
Glucose, UA: NEGATIVE mg/dL
Ketones, ur: NEGATIVE mg/dL
Nitrite: NEGATIVE
Protein, ur: NEGATIVE mg/dL
Specific Gravity, Urine: 1.01 (ref 1.005–1.030)
Urobilinogen, UA: 0.2 mg/dL (ref 0.0–1.0)
pH: 7 (ref 5.0–8.0)

## 2020-11-20 MED ORDER — NITROFURANTOIN MONOHYD MACRO 100 MG PO CAPS: 100.0000 mg | ORAL_CAPSULE | Freq: Two times a day (BID) | ORAL | 0 refills | Status: AC

## 2020-11-20 NOTE — ED Triage Notes (Signed)
Pt reports increase urinary frequency, lower abdominal pain when drinking water x 4 days. Pt states feeling bloated.

## 2020-11-20 NOTE — Discharge Instructions (Addendum)
Take antibiotics as prescribed.  If anything worsens please return for further evaluation.  Follow-up with urology.

## 2020-11-20 NOTE — ED Provider Notes (Signed)
[place]    CSN: [id number] Arrival date & time: 11/20/20  1837      History   Chief Complaint Chief Complaint  Patient presents with  . Urinary Frequency    HPI Amy Morrow is a 60 y.o. female.   Patient presents today with a 4-day history of urinary frequency and urgency.  She denies hematuria, flank pain, abdominal pain, nausea, vomiting, fever, dysuria.  She has not tried any over-the-counter medications for symptom management.  She has a history of recurrent UTI and is followed by urology.  She recently completed course of doxycycline for UTI.  Patient has a history of cervical cancer and had bladder injury during hysterectomy that required revision and therefore is prone to UTIs.  Reports current symptoms are similar to previous episodes of this condition.  Denies any recent urogenital procedure, self-catheterization, single kidney, history of nephrolithiasis.     Past Medical History:  Diagnosis Date  . Abnormal Pap smear   . Anemia   . Cervical cancer [facility]) oncologist- dr Denman George   Stage IB1  SCCa   . Dyspnea   . Fatigue   . Hyperlipidemia   . Left breast mass 2013   7x4x4 mm 6o'clock  . PONV (postoperative nausea and vomiting)   . Pre-diabetes   . Sepsis ([place]) 03/2018   Urosepsis, Resolved  . Thyroid nodule 03/25/2018   inferior right thyroid, noted on US thyroid  . Ureterovaginal fistula   . Urgency of urination    intermittant  . Urinary frequency     Patient Active Problem List   Diagnosis Date Noted  . Chemotherapy-induced nausea 10/14/2018  . Pancytopenia, acquired ([place]) 10/07/2018  . Weight loss, non-intentional 10/07/2018  . 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 Primary thyroid cancer ([place]) 09/27/2018  . Urinary retention 09/16/2018  . Right ureteral injury 06/24/2018  . Thyroid nodule 03/25/2018  . Hypotension   . Anemia 03/03/2018  . Sepsis ([place]) 03/02/2018  . 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  Vaginal discharge 02/16/2018  . Cervical cancer, FIGO stage IB2 ([place]) 02/08/2018  . Cervical cancer, FIGO stage IB1 ([place]) 02/08/2018  . Abnormal uterine bleeding (AUB) 12/07/2017  . Neuropathic pain 02/06/2014  . Lichen planus 60/73/7106  . Dysuria 01/25/2013  . Recurrent cervical cancer ([place]) 07/29/2012  . Perimenopause 07/29/2012  . H/O burning pain in leg 06/24/2012  . CIN I (cervical intraepithelial neoplasia I) 07/11/2011  . LSIL (low grade squamous intraepithelial lesion) on Pap smear 06/11/2011    Past Surgical History:  Procedure Laterality Date  . ABDOMINAL HYSTERECTOMY    . BREAST BIOPSY Left 06/15/2012  . CYSTOSCOPY W/ RETROGRADES Bilateral 02/16/2018   Procedure: CYSTOSCOPY WITH RETROGRADE PYELOGRAM BILATERAL DIAGNOSTIC URETEROSCOPY, BILATERAL  STENT PLACEMENT;  Surgeon: Ardis Hughs, MD;  Location: [facility];  Service: Urology;  Laterality: Bilateral;  . CYSTOSCOPY W/ URETERAL STENT PLACEMENT Bilateral 04/22/2018   Procedure: CYSTOSCOPY WITH BILATERAL RETROGRADE PYELOGRAM/ AND URETERAL STENT EXCHANGE, vaginal exam;  Surgeon: Ardis Hughs, MD;  Location: [facility];  Service: Urology;  Laterality: Bilateral;  . CYSTOSCOPY W/ URETERAL STENT PLACEMENT Bilateral 05/28/2018   Procedure: CYSTOSCOPY WITH BILATERAL  RETROGRADE PYELOGRAM/URETERAL STENT REPLACEMENT, REMOVAL OF BILATERAL PERCUTANEOUS DRAINS;  Surgeon: Ardis Hughs, MD;  Location: [facility];  Service: Urology;  Laterality: Bilateral;  . D & C LEEP CONIZATION BX  07-31-2003   dr p. rose [facility]  . IR IMAGING GUIDED PORT INSERTION  09/27/2018  . IR  NEPHROSTOGRAM RIGHT THRU EXISTING ACCESS  03/18/2018  . IR NEPHROSTOMY EXCHANGE LEFT  04/27/2018  . IR NEPHROSTOMY EXCHANGE RIGHT  04/27/2018  . IR NEPHROSTOMY PLACEMENT LEFT  03/18/2018  . IR NEPHROSTOMY PLACEMENT RIGHT  03/02/2018  . IR REMOVAL TUN ACCESS W/ PORT W/O FL MOD SED  03/17/2019  . PELVIC LYMPH NODE DISSECTION Bilateral  02/08/2018   Procedure: BILATEAL PEVIC  LYMPHADENECTOMY;  Surgeon: [name], MD;  [facility];  Service: Gynecology;  Laterality: Bilateral;  . ROBOTIC ASSISTED TOTAL HYSTERECTOMY WITH BILATERAL SALPINGO OOPHERECTOMY N/A 02/08/2018   Procedure: XI ROBOTIC ASSISTED TOTAL Radical HYSTERECTOMY WITH BILATERAL SALPINGO OOPHORECTOMY;  Surgeon: [name], MD;  Location[facility];  Service: Gynecology;  Laterality: N/A;  . Transvaginal tape placement  03-12-2009  dr Emeterio Reeve  [facility]   GYNECARE TENSION-FREE VAGINAL TAPE SLING  . TUBAL LIGATION  05-28-2005  DR  MARSHALL  @ [facility]   [facility]    OB History    Gravida  6   Para  5   Term  5   Preterm  0   AB  1   Living  5     SAB  1   IAB  0   Ectopic  0   Multiple  0   Live Births  5            Home Medications    Prior to Admission medications   Medication Sig Start Date End Date Taking? Authorizing Provider  nitrofurantoin, macrocrystal-monohydrate, (MACROBID) 100 MG capsule Take 1 capsule (100 mg total) by mouth 2 (two) times daily for 7 days. 11/20/20 11/27/20 Yes Lodema Parma K, [name]  calcitRIOL (ROCALTROL) 0.25 MCG capsule Take 0.25 mcg by mouth 2 (two) times daily. 03/02/20   [provider]  calcium-vitamin D (OSCAL WITH D) 500-200 MG-UNIT TABS tablet Take by mouth. 05/25/19   [provider]  EUTHYROX 112 MCG tablet Take 112 mcg by mouth daily. 06/14/19   [provider]  metFORMIN (GLUCOPHAGE-XR) 500 MG 24 hr tablet Take 500 mg by mouth daily with breakfast.  11/12/18   [provider]  pravastatin (PRAVACHOL) 20 MG tablet Take by mouth. 02/28/19   [provider]  promethazine-dextromethorphan (PROMETHAZINE-DM) 6.25-15 MG/5ML syrup Take 5 mLs by mouth at bedtime as needed for cough. 08/16/20   Jaynee Eagles, [name]    Family History Family History  Problem Relation Age of Onset  . Hypertension Mother   . Heart disease Mother   . Hypertension Brother   . Heart disease Brother      Social History Social History   Tobacco Use  . Smoking status: Never Smoker  . Smokeless tobacco: Never Used  Vaping Use  . Vaping Use: Never used  Substance Use Topics  . Alcohol use: No  . Drug use: No     Allergies   Patient has no known allergies.   Review of Systems Review of Systems  Constitutional: Negative for activity change, appetite change, fatigue and fever.  Respiratory: Negative for cough and shortness of breath.   Cardiovascular: Negative for chest pain.  Gastrointestinal: Negative for abdominal pain, diarrhea, nausea and vomiting.  Genitourinary: Positive for frequency and urgency. Negative for dysuria, flank pain, hematuria, vaginal bleeding, vaginal discharge and vaginal pain.  Musculoskeletal: Positive for back pain. Negative for arthralgias and myalgias.  Neurological: Negative for dizziness, light-headedness and headaches.     Physical Exam Triage Vital Signs ED Triage Vitals  Enc Vitals Group  BP 11/20/20 1959 (!) 135/92     Pulse Rate 11/20/20 1959 97     Resp 11/20/20 1959 18     Temp 11/20/20 1959 98.3 F (36.8 C)     Temp Source 11/20/20 1959 Oral     SpO2 11/20/20 1959 98 %     Weight --      Height --      Head Circumference --      Peak Flow --      Pain Score 11/20/20 1955 8     Pain Loc --      Pain Edu? --      Excl. in [place]? --    No data found.  Updated Vital Signs BP (!) 135/92 (BP Location: Right Arm)   Pulse 97   Temp 98.3 F (36.8 C) (Oral)   Resp 18   LMP 11/16/2012   SpO2 98%   Visual Acuity Right Eye Distance:   Left Eye Distance:   Bilateral Distance:    Right Eye Near:   Left Eye Near:    Bilateral Near:     Physical Exam Vitals reviewed.  Constitutional:      General: She is awake. She is not in acute distress.    Appearance: Normal appearance. She is not ill-appearing.     Comments: Very pleasant female appears stated age in no acute distress  HENT:     Head: Normocephalic and atraumatic.   Cardiovascular:     Rate and Rhythm: Normal rate and regular rhythm.     Heart sounds: No murmur heard.   Pulmonary:     Effort: Pulmonary effort is normal.     Breath sounds: Normal breath sounds. No wheezing, rhonchi or rales.     Comments: Clear to auscultation bilaterally Abdominal:     General: Bowel sounds are normal.     Palpations: Abdomen is soft.     Tenderness: There is abdominal tenderness in the suprapubic area. There is no right CVA tenderness, left CVA tenderness, guarding or rebound.     Comments: Mild tenderness palpation suprapubic region.  No CVA tenderness on exam.  Musculoskeletal:     Cervical back: No tenderness or bony tenderness.     Thoracic back: No tenderness or bony tenderness.     Lumbar back: No tenderness or bony tenderness.  Psychiatric:        Behavior: Behavior is cooperative.      UC Treatments / Results  Labs (all labs ordered are listed, but only abnormal results are displayed) Labs Reviewed  POCT URINALYSIS DIPSTICK, ED / UC - Abnormal; Notable for the following components:      Result Value   Hgb urine dipstick MODERATE (*)    Leukocytes,Ua SMALL (*)    All other components within normal limits  URINE CULTURE    EKG   Radiology No results found.  Procedures Procedures (including critical care time)  Medications Ordered in UC Medications - No data to display  Initial Impression / Assessment and Plan / UC Course  I have reviewed the triage vital signs and the nursing notes.  Pertinent labs & imaging results that were available during my care of the patient were reviewed by me and considered in my medical decision making (see chart for details).     UA shows evidence of urinary tract infection.  Urine culture obtained today-result pending.  Will treat with Macrobid and determine if additional antibiotics are required based on urine culture results.  She was  encouraged to drink plenty of fluid.  Recommended she follow-up  with urology.  Strict return precautions given to which patient expressed understanding.  Final Clinical Impressions(s) / UC Diagnoses   Final diagnoses:  Lower urinary tract infectious disease     Discharge Instructions     Take antibiotics as prescribed.  If anything worsens please return for further evaluation.  Follow-up with urology.    ED Prescriptions    Medication Sig Dispense Auth. Provider   nitrofurantoin, macrocrystal-monohydrate, (MACROBID) 100 MG capsule Take 1 capsule (100 mg total) by mouth 2 (two) times daily for 7 days. 14 capsule Allysia Ingles K, [name]     PDMP not reviewed this encounter.   Terrilee Croak, [name] 11/20/20 2039

## 2020-11-22 LAB — URINE CULTURE: Culture: <10000 — AB

## 2020-12-12 ENCOUNTER — Ambulatory Visit (HOSPITAL_COMMUNITY)
Admission: EM | Admit: 2020-12-12 | Discharge: 2020-12-12 | Disposition: A | Discharge status: Home / Self Care | Payer: 59 | Attending: Internal Medicine | Admitting: Internal Medicine

## 2020-12-12 ENCOUNTER — Encounter (HOSPITAL_COMMUNITY): Payer: Self-pay

## 2020-12-12 ENCOUNTER — Other Ambulatory Visit: Payer: Self-pay

## 2020-12-12 DIAGNOSIS — B9689 Other specified bacterial agents as the cause of diseases classified elsewhere: Secondary | ICD-10-CM | POA: Diagnosis not present

## 2020-12-12 DIAGNOSIS — N390 Urinary tract infection, site not specified: Secondary | ICD-10-CM | POA: Insufficient documentation

## 2020-12-12 LAB — POCT URINALYSIS DIPSTICK, ED / UC
Bilirubin Urine: NEGATIVE
Glucose, UA: NEGATIVE mg/dL
Ketones, ur: NEGATIVE mg/dL
Nitrite: NEGATIVE
Protein, ur: 100 mg/dL — AB
Specific Gravity, Urine: 1.01 (ref 1.005–1.030)
Urobilinogen, UA: 0.2 mg/dL (ref 0.0–1.0)
pH: 6 (ref 5.0–8.0)

## 2020-12-12 MED ORDER — SULFAMETHOXAZOLE-TRIMETHOPRIM 800-160 MG PO TABS: 1.0000 | ORAL_TABLET | Freq: Two times a day (BID) | ORAL | 0 refills | Status: DC

## 2020-12-12 NOTE — ED Provider Notes (Signed)
[place]    CSN: [id number] Arrival date & time: 12/12/20  1301      History   Chief Complaint Chief Complaint  Patient presents with  . Urinary Frequency  . Chills  . Generalized Body Aches  . Headache    HPI Amy Morrow is a 60 y.o. female.   Medical interpreter utilized today for facilitation of visit with patient's consent.  She is presenting today with 2-day history of urinary frequency, dysuria, chills, body aches, headache that has since resolved.  Does have some suprapubic abdominal pain as well at times.  She denies known fever, back pain, nausea vomiting or diarrhea.  So far not trying anything over-the-counter for symptoms.  Long history of urinary tract infections, bladder injury from past hysterectomy.     Past Medical History:  Diagnosis Date  . Abnormal Pap smear   . Anemia   . Cervical cancer [facility]) oncologist- dr Denman George   Stage IB1  SCCa   . Dyspnea   . Fatigue   . Hyperlipidemia   . Left breast mass 2013   7x4x4 mm 6o'clock  . PONV (postoperative nausea and vomiting)   . Pre-diabetes   . Sepsis ([place]) 03/2018   Urosepsis, Resolved  . Thyroid nodule 03/25/2018   inferior right thyroid, noted on US thyroid  . Ureterovaginal fistula   . Urgency of urination    intermittant  . Urinary frequency     Patient Active Problem List   Diagnosis Date Noted  . Chemotherapy-induced nausea 10/14/2018  . Pancytopenia, acquired ([place]) 10/07/2018  . Weight loss, non-intentional 10/07/2018  . 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 Primary thyroid cancer ([place]) 09/27/2018  . Urinary retention 09/16/2018  . Right ureteral injury 06/24/2018  . Thyroid nodule 03/25/2018  . Hypotension   . Anemia 03/03/2018  . Sepsis ([place]) 03/02/2018  . 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 Vaginal discharge 02/16/2018  . Cervical cancer, FIGO stage IB2 ([place]) 02/08/2018  . Cervical cancer, FIGO stage IB1 ([place]) 02/08/2018  . Abnormal  uterine bleeding (AUB) 12/07/2017  . Neuropathic pain 02/06/2014  . Lichen planus 08/65/7846  . Dysuria 01/25/2013  . Recurrent cervical cancer ([place]) 07/29/2012  . Perimenopause 07/29/2012  . H/O burning pain in leg 06/24/2012  . CIN I (cervical intraepithelial neoplasia I) 07/11/2011  . LSIL (low grade squamous intraepithelial lesion) on Pap smear 06/11/2011    Past Surgical History:  Procedure Laterality Date  . ABDOMINAL HYSTERECTOMY    . BREAST BIOPSY Left 06/15/2012  . CYSTOSCOPY W/ RETROGRADES Bilateral 02/16/2018   Procedure: CYSTOSCOPY WITH RETROGRADE PYELOGRAM BILATERAL DIAGNOSTIC URETEROSCOPY, BILATERAL  STENT PLACEMENT;  Surgeon: Ardis Hughs, MD;  [facility];  Service: Urology;  Laterality: Bilateral;  . CYSTOSCOPY W/ URETERAL STENT PLACEMENT Bilateral 04/22/2018   Procedure: CYSTOSCOPY WITH BILATERAL RETROGRADE PYELOGRAM/ AND URETERAL STENT EXCHANGE, vaginal exam;  Surgeon: Ardis Hughs, MD;  Location: [facility];  Service: Urology;  Laterality: Bilateral;  . CYSTOSCOPY W/ URETERAL STENT PLACEMENT Bilateral 05/28/2018   Procedure: CYSTOSCOPY WITH BILATERAL  RETROGRADE PYELOGRAM/URETERAL STENT REPLACEMENT, REMOVAL OF BILATERAL PERCUTANEOUS DRAINS;  Surgeon: Ardis Hughs, MD;  Location: [facility];  Service: Urology;  Laterality: Bilateral;  . D & C LEEP CONIZATION BX  07-31-2003   dr p. rose [facility]  . IR IMAGING GUIDED PORT INSERTION  09/27/2018  . IR NEPHROSTOGRAM RIGHT THRU EXISTING ACCESS  03/18/2018  . IR NEPHROSTOMY EXCHANGE LEFT  04/27/2018  . IR NEPHROSTOMY  EXCHANGE RIGHT  04/27/2018  . IR NEPHROSTOMY PLACEMENT LEFT  03/18/2018  . IR NEPHROSTOMY PLACEMENT RIGHT  03/02/2018  . IR REMOVAL TUN ACCESS W/ PORT W/O FL MOD SED  03/17/2019  . PELVIC LYMPH NODE DISSECTION Bilateral 02/08/2018   Procedure: BILATEAL PEVIC  LYMPHADENECTOMY;  Surgeon: [name], MD;  [facility];  Service: Gynecology;  Laterality: Bilateral;  .  ROBOTIC ASSISTED TOTAL HYSTERECTOMY WITH BILATERAL SALPINGO OOPHERECTOMY N/A 02/08/2018   Procedure: XI ROBOTIC ASSISTED TOTAL Radical HYSTERECTOMY WITH BILATERAL SALPINGO OOPHORECTOMY;  Surgeon: [name], MD;  [facility];  Service: Gynecology;  Laterality: N/A;  . Transvaginal tape placement  03-12-2009  dr Emeterio Reeve  [facility]   GYNECARE TENSION-FREE VAGINAL TAPE SLING  . TUBAL LIGATION  05-28-2005  DR  MARSHALL  @ [facility]   PPTL    OB History    Gravida  6   Para  5   Term  5   Preterm  0   AB  1   Living  5     SAB  1   IAB  0   Ectopic  0   Multiple  0   Live Births  5            Home Medications    Prior to Admission medications   Medication Sig Start Date End Date Taking? Authorizing Provider  calcitRIOL (ROCALTROL) 0.25 MCG capsule Take 0.25 mcg by mouth 2 (two) times daily. 03/02/20  Yes [provider]  calcium-vitamin D (OSCAL WITH D) 500-200 MG-UNIT TABS tablet Take by mouth. 05/25/19  Yes [provider]  EUTHYROX 112 MCG tablet Take 112 mcg by mouth daily. 06/14/19  Yes [provider]  metFORMIN (GLUCOPHAGE-XR) 500 MG 24 hr tablet Take 500 mg by mouth daily with breakfast.  11/12/18  Yes [provider]  pravastatin (PRAVACHOL) 20 MG tablet Take by mouth. 02/28/19  Yes [provider]  sulfamethoxazole-trimethoprim (BACTRIM DS) 800-160 MG tablet Take 1 tablet by mouth 2 (two) times daily. 12/12/20  Yes Volney American, [name]  promethazine-dextromethorphan (PROMETHAZINE-DM) 6.25-15 MG/5ML syrup Take 5 mLs by mouth at bedtime as needed for cough. 08/16/20   Jaynee Eagles, [name]    Family History Family History  Problem Relation Age of Onset  . Hypertension Mother   . Heart disease Mother   . Hypertension Brother   . Heart disease Brother     Social History Social History   Tobacco Use  . Smoking status: Never Smoker  . Smokeless tobacco: Never Used  Vaping Use  . Vaping Use: Never used  Substance  Use Topics  . Alcohol use: No  . Drug use: No     Allergies   Patient has no known allergies.   Review of Systems Review of Systems Per HPI  Physical Exam Triage Vital Signs ED Triage Vitals  Enc Vitals Group     BP 12/12/20 1449 120/82     Pulse Rate 12/12/20 1449 (!) 104     Resp 12/12/20 1449 18     Temp 12/12/20 1449 98.6 F (37 C)     Temp src --      SpO2 12/12/20 1449 98 %     Weight --      Height --      Head Circumference --      Peak Flow --      Pain Score 12/12/20 1442 6     Pain Loc --      Pain  Edu? --      Excl. in [place]? --    No data found.  Updated Vital Signs BP 120/82   Pulse (!) 104   Temp 98.6 F (37 C)   Resp 18   LMP 11/16/2012   SpO2 98%   Visual Acuity Right Eye Distance:   Left Eye Distance:   Bilateral Distance:    Right Eye Near:   Left Eye Near:    Bilateral Near:     Physical Exam Vitals and nursing note reviewed.  Constitutional:      Appearance: Normal appearance. She is not ill-appearing.  HENT:     Head: Atraumatic.     Mouth/Throat:     Mouth: Mucous membranes are moist.     Pharynx: Oropharynx is clear.  Eyes:     Extraocular Movements: Extraocular movements intact.     Conjunctiva/sclera: Conjunctivae normal.  Cardiovascular:     Rate and Rhythm: Normal rate and regular rhythm.     Heart sounds: Normal heart sounds.  Pulmonary:     Effort: Pulmonary effort is normal.     Breath sounds: Normal breath sounds.  Abdominal:     General: Bowel sounds are normal. There is no distension.     Palpations: Abdomen is soft.     Tenderness: There is abdominal tenderness. There is no right CVA tenderness, left CVA tenderness or guarding.     Comments: Mild suprapubic tenderness palpation  Musculoskeletal:        General: Normal range of motion.     Cervical back: Normal range of motion and neck supple.  Skin:    General: Skin is warm and dry.  Neurological:     Mental Status: She is alert and oriented to person,  place, and time.  Psychiatric:        Mood and Affect: Mood normal.        Thought Content: Thought content normal.        Judgment: Judgment normal.      UC Treatments / Results  Labs (all labs ordered are listed, but only abnormal results are displayed) Labs Reviewed  POCT URINALYSIS DIPSTICK, ED / UC - Abnormal; Notable for the following components:      Result Value   Hgb urine dipstick LARGE (*)    Protein, ur 100 (*)    Leukocytes,Ua SMALL (*)    All other components within normal limits  URINE CULTURE    EKG   Radiology No results found.  Procedures Procedures (including critical care time)  Medications Ordered in UC Medications - No data to display  Initial Impression / Assessment and Plan / UC Course  I have reviewed the triage vital signs and the nursing notes.  Pertinent labs & imaging results that were available during my care of the patient were reviewed by me and considered in my medical decision making (see chart for details).     Symptoms and exam findings as well as UA today suspicious for urinary tract infection.  Urine culture pending, will treat with Bactrim, push fluids, over-the-counter pain relievers as needed.  Adjust treatment as needed based on these results.  Return for acutely worsening symptoms.  Final Clinical Impressions(s) / UC Diagnoses   Final diagnoses:  Lower urinary tract infectious disease   Discharge Instructions   None    ED Prescriptions    Medication Sig Dispense Auth. Provider   sulfamethoxazole-trimethoprim (BACTRIM DS) 800-160 MG tablet Take 1 tablet by mouth 2 (two) times daily. 10  tablet Volney American, [place]     PDMP not reviewed this encounter.   Volney American, [place] 12/12/20 1536

## 2020-12-12 NOTE — ED Notes (Addendum)
Triage completed with help of spanish interpreter on stratus.

## 2020-12-12 NOTE — ED Triage Notes (Addendum)
Pt c/o urinary frequency and bladder pain since Saturday. Pt also reports body aches, chills and a mild headache. Reports has been taking tylenol with some relief.  Pt reports that in the past during a surgery a doctor accidentally cut her urinary tract and is wondering whether this is related to her current symptoms.

## 2020-12-14 LAB — URINE CULTURE: Culture: >=100000 — AB

## 2020-12-24 ENCOUNTER — Inpatient Hospital Stay: Payer: 59 | Attending: Gynecologic Oncology | Admitting: Gynecologic Oncology

## 2020-12-24 ENCOUNTER — Other Ambulatory Visit: Payer: Self-pay

## 2020-12-24 ENCOUNTER — Other Ambulatory Visit (HOSPITAL_COMMUNITY)
Admission: RE | Admit: 2020-12-24 | Discharge: 2020-12-24 | Disposition: A | Discharge status: Home / Self Care | Payer: 59 | Source: Ambulatory Visit | Attending: Gynecologic Oncology | Admitting: Gynecologic Oncology

## 2020-12-24 VITALS — BP 122/70 | HR 83 | Temp 97.0°F | Resp 18 | Wt 144.2 lb

## 2020-12-24 DIAGNOSIS — Z7984 Long term (current) use of oral hypoglycemic drugs: Secondary | ICD-10-CM | POA: Insufficient documentation

## 2020-12-24 DIAGNOSIS — Z90722 Acquired absence of ovaries, bilateral: Secondary | ICD-10-CM | POA: Diagnosis not present

## 2020-12-24 DIAGNOSIS — E785 Hyperlipidemia, unspecified: Secondary | ICD-10-CM | POA: Diagnosis not present

## 2020-12-24 DIAGNOSIS — C539 Malignant neoplasm of cervix uteri, unspecified: Secondary | ICD-10-CM | POA: Insufficient documentation

## 2020-12-24 DIAGNOSIS — Z90710 Acquired absence of both cervix and uterus: Secondary | ICD-10-CM | POA: Diagnosis not present

## 2020-12-24 DIAGNOSIS — E119 Type 2 diabetes mellitus without complications: Secondary | ICD-10-CM | POA: Insufficient documentation

## 2020-12-24 DIAGNOSIS — Z79899 Other long term (current) drug therapy: Secondary | ICD-10-CM | POA: Insufficient documentation

## 2020-12-24 DIAGNOSIS — Z923 Personal history of irradiation: Secondary | ICD-10-CM | POA: Insufficient documentation

## 2020-12-24 DIAGNOSIS — Z85850 Personal history of malignant neoplasm of thyroid: Secondary | ICD-10-CM | POA: Diagnosis not present

## 2020-12-24 DIAGNOSIS — Z8541 Personal history of malignant neoplasm of cervix uteri: Secondary | ICD-10-CM

## 2020-12-24 DIAGNOSIS — Z9221 Personal history of antineoplastic chemotherapy: Secondary | ICD-10-CM | POA: Diagnosis not present

## 2020-12-24 NOTE — Patient Instructions (Signed)
Please notify Dr Denman George at phone number [phone] if you notice vaginal bleeding, new pelvic or abdominal pains, bloating, feeling full easy, or a change in bladder or bowel function.   Please return to see Dr Sondra Come in July as scheduled.  Please have Dr Clabe Seal office contact Dr Serita Grit office (at [phone]) in July after your appointment with him to request an appointment with Dr Denman George for January, 2023.

## 2020-12-24 NOTE — Progress Notes (Signed)
Follow-up Note: GYN-ONC  Consult was requested by Dr. Darron Doom, MD   CC:  Chief Complaint  Patient presents with  . Recurrent cervical cancer (HCC)  history of uretero-vaginal fistulae.  Patient was seen with an interpretor.   Assessment/Plan: 1. 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 Ureterovaginal fistula (bilateral)  o S/p repair with Dr Louis Meckel on 06/24/18.  o No residual symptoms  3.   Thyroid cancer  - S/P thyroidectomy  At [facility].  - s/p radioactive iodine  She will see Dr Sondra Come in 3 months and myself in 6 months.   HPI: Amy Morrow  is a 60 y.o.  P5 who was initially referred in 2019 with a new diagnosis of cervical cancer.  She had been undergoing cervical cancer screening through [place] [facility]. 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 01/2012 LSIL + endometrial cells 07/2012 Pap negative 01/2013 Pap negative with HRHPV not detected 01/2015 Pap negative and HRHPV not detected  Was noting postmenopausal bleeding and saw [place] [facility] again. 12/2017 Pap negative now HRHPV detected A mass was noted on exam and she was referred to the [place] [facility] where Dr. 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   She underwent a PET/CT which showed no apparent metastatic disease. On 02/08/18 she underwent robotic assisted radical hysterectomy, upper vaginectomy, bilateral pelvic lymphadenectomy.  Operative findings were significant for a 2 to 3 cm pedunculated exophytic mass from the right anterior cervix.  There was no clinical involvement of the parametrium and no suspicious nodes.  The surgery was overall fairly uncomplicated there was some increased bleeding encountered during the dissection around the posterior bladder pillars.  At the completion of the procedure the ureters bilaterally  appeared intact and were completely skeletonized in the distal third free from all parametrial tissue.  Her tumor was staged as IB2, squamous cell carcinoma of the cervix, and low risk features were noted therefore no adjuvant therapy was recommended.  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 Louis Meckel.  During that cystoscopic procedure Dr. Louis Meckel placed ureteral stents bilaterally.  On 03/02/18 she underwent right percutaneous nephrostomy tube placement. After placement of the right PCN she noted slight decrease in urinary leakage from the vagina, though still persisted, therefore placement of a left PCN was placed on 03/18/18.  She was followed by Dr Louis Meckel who performed ureteroscopy in early October, 2019 and identified resolution of left fistula and almost resolution in right.   On 06/24/18 Dr Louis Meckel performed robotic assisted bilateral ureteral reimplantation (ureteroneocystotomy).  She was seen on 08/27/17 as part of routine scheduled surveillance. She denied vaginal bleeding symptoms.  However, on vaginal examiation, a soft exophytic 1cm lesion was identified at the left vaginal fornix. It was biopsied. It was resulted as squamous cell carcinoma, consistent with recurrence.  PET showed localized disease at the left vaginal fornix and no distant sites (though a thyroid nodule was pet avid).  She was treated with salvage chemoradiation between September 27, 2018 and December 01, 2018.  This included 5 cycles of weekly radiosensitizing cisplatin 40 mg per metered squared.  Additionally which she received IMRT with 45 Gray in 25 fractions to the pelvis and vagina between therapy 24th and  November 01, 2018, and high-dose-rate vaginal cuff brachytherapy, 24 Gray in 4 fractions, delivered between April 8 and December 01, 2018.  She tolerated therapy well and had no adverse  effects.  After completing therapy she underwent thyroidectomy at [facility] and subsequent radioactive iodine.  Post treatment PET was performed (prior to her thyroidectomy) on March 02, 2019.  This revealed persistent focal hypermetabolism in the right lobe of the thyroid, no evidence of metastatic disease, previously questioned hypermetabolism along the left vaginal cuff was not appreciated.  She was felt to have had a complete clinical response to her salvage therapy and transitioned to surveillance.  Interval Hx:  She has no symptoms concerning for recurrence.   Current Meds:  Outpatient Encounter Medications as of 12/24/2020  Medication Sig  . calcitRIOL (ROCALTROL) 0.25 MCG capsule Take 0.25 mcg by mouth 2 (two) times daily.  . calcium-vitamin D (OSCAL WITH D) 500-200 MG-UNIT TABS tablet Take by mouth.  . EUTHYROX 112 MCG tablet Take 112 mcg by mouth daily.  . metFORMIN (GLUCOPHAGE-XR) 500 MG 24 hr tablet Take 500 mg by mouth daily with breakfast.   . pravastatin (PRAVACHOL) 20 MG tablet Take by mouth.  . promethazine-dextromethorphan (PROMETHAZINE-DM) 6.25-15 MG/5ML syrup Take 5 mLs by mouth at bedtime as needed for cough.  . sulfamethoxazole-trimethoprim (BACTRIM DS) 800-160 MG tablet Take 1 tablet by mouth 2 (two) times daily.   No facility-administered encounter medications on file as of 12/24/2020.    Allergy: No Known Allergies  Social Hx:  Tobacco use: none Alcohol use: none Illicit Drug use: none Illicit IV Drug use: none  Past Surgical Hx:  Past Surgical History:  Procedure Laterality Date  . ABDOMINAL HYSTERECTOMY    . BREAST BIOPSY Left 06/15/2012  . CYSTOSCOPY W/ RETROGRADES Bilateral 02/16/2018   Procedure: CYSTOSCOPY WITH RETROGRADE PYELOGRAM BILATERAL DIAGNOSTIC URETEROSCOPY, BILATERAL  STENT PLACEMENT;  Surgeon: Ardis Hughs, MD;  Location: [facility];  Service: Urology;  Laterality: Bilateral;  . CYSTOSCOPY W/ URETERAL STENT PLACEMENT  Bilateral 04/22/2018   Procedure: CYSTOSCOPY WITH BILATERAL RETROGRADE PYELOGRAM/ AND URETERAL STENT EXCHANGE, vaginal exam;  Surgeon: Ardis Hughs, MD;  Location: [facility];  Service: Urology;  Laterality: Bilateral;  . CYSTOSCOPY W/ URETERAL STENT PLACEMENT Bilateral 05/28/2018   Procedure: CYSTOSCOPY WITH BILATERAL  RETROGRADE PYELOGRAM/URETERAL STENT REPLACEMENT, REMOVAL OF BILATERAL PERCUTANEOUS DRAINS;  Surgeon: Ardis Hughs, MD;  Location: [facility];  Service: Urology;  Laterality: Bilateral;  . D & C LEEP CONIZATION BX  07-31-2003   dr p. rose [facility]  . IR IMAGING GUIDED PORT INSERTION  09/27/2018  . IR NEPHROSTOGRAM RIGHT THRU EXISTING ACCESS  03/18/2018  . IR NEPHROSTOMY EXCHANGE LEFT  04/27/2018  . IR NEPHROSTOMY EXCHANGE RIGHT  04/27/2018  . IR NEPHROSTOMY PLACEMENT LEFT  03/18/2018  . IR NEPHROSTOMY PLACEMENT RIGHT  03/02/2018  . IR REMOVAL TUN ACCESS W/ PORT W/O FL MOD SED  03/17/2019  . PELVIC LYMPH NODE DISSECTION Bilateral 02/08/2018   Procedure: BILATEAL PEVIC  LYMPHADENECTOMY;  Surgeon: [name], MD;  Location: [facility];  Service: Gynecology;  Laterality: Bilateral;  . ROBOTIC ASSISTED TOTAL HYSTERECTOMY WITH BILATERAL SALPINGO OOPHERECTOMY N/A 02/08/2018   Procedure: XI ROBOTIC ASSISTED TOTAL Radical HYSTERECTOMY WITH BILATERAL SALPINGO OOPHORECTOMY;  Surgeon: [name], MD;  Location: [facility];  Service: Gynecology;  Laterality: N/A;  . Transvaginal tape placement  03-12-2009  dr Emeterio Reeve  [facility]   GYNECARE TENSION-FREE VAGINAL TAPE SLING  . TUBAL LIGATION  05-28-2005  DR  MARSHALL  @ [facility]   PPTL    Past Medical Hx:  Past Medical History:  Diagnosis Date  . Abnormal Pap smear   . Anemia   . Cervical cancer [facility]) oncologist- dr Denman George   Stage IB1  SCCa   . Dyspnea   . Fatigue   . Hyperlipidemia   . Left breast mass 2013   7x4x4 mm 6o'clock  . PONV (postoperative nausea and vomiting)   . Pre-diabetes   . Sepsis ([place]) 03/2018    Urosepsis, Resolved  . Thyroid nodule 03/25/2018   inferior right thyroid, noted on US thyroid  . Ureterovaginal fistula   . Urgency of urination    intermittant  . Urinary frequency     Past Gynecological History:   GYNECOLOGIC HISTORY:  Patient's last menstrual period was 11/16/2012. Menarche: 60 years old P 5 LMP 60 yo Contraceptive 5 years OCP HRT none  Last Pap see HPI  Family Hx:  Family History  Problem Relation Age of Onset  . Hypertension Mother   . Heart disease Mother   . Hypertension Brother   . Heart disease Brother     Review of Systems:  Review of Systems  Constitutional: Negative.   HENT:  Negative.   Eyes: Negative.   Respiratory: Negative.   Cardiovascular: Negative.   Endocrine: Negative.   Genitourinary: Negative.         No leakage of urine from vagina. + sensation of incomplete emptying.   Musculoskeletal: Negative.   Skin: Negative.   Neurological: Negative.   Hematological: Negative.   Psychiatric/Behavioral: Negative for depression.    Vitals:  Blood pressure 122/70, pulse 83, temperature (!) 97 F (36.1 C), temperature source Tympanic, resp. rate 18, weight 144 lb 3.2 oz (65.4 kg), last menstrual period 11/16/2012, SpO2 99 %. Body mass index is 29.12 kg/m.   Physical Exam: ECOG PERFORMANCE STATUS: 1 - Symptomatic but completely ambulatory   General :  Well developed, 60 y.o., female in no apparent distress HEENT:  Normocephalic/atraumatic, symmetric, EOMI, eyelids normal Neck:   Supple, no masses. Well healed thyroidectomy incision. Lymphatics:  No cervical/ submandibular/ supraclavicular/ infraclavicular/ inguinal adenopathy Respiratory:  Respirations unlabored, no use of accessory muscles CV:   Deferred Breast:  Deferred Musculoskeletal: No CVA tenderness, normal muscle strength. Abdomen:  Soft, non-tender and nondistended. No evidence of hernia. No masses. Well healed incisions.  Extremities:  No lymphedema, no erythema,  non-tender. 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  Neuro/Psych:  No focal motor deficit, no abnormal mental status. Normal gait. Normal affect. Alert and oriented to person, place, and time  Genito Urinary: Speculum exam reveals no blood. No lesions or palpable masses. Shortened vagina.  Rectovaginal:  No rectal lesions, mass not appreciated via rectum, no paravaginal infiltration.    Thereasa Solo, MD  12/24/2020, 2:40 PM    Cc: Darron Doom, MD (Referring Ob/Gyn)

## 2020-12-25 LAB — CYTOLOGY - PAP
Comment: NEGATIVE
Diagnosis: UNDETERMINED — AB
High risk HPV: NEGATIVE

## 2020-12-26 ENCOUNTER — Telehealth: Payer: Self-pay

## 2020-12-26 NOTE — Telephone Encounter (Signed)
Spoke with interpreter Almyra Free re: Pap smear results for Amy Morrow. Pap showed radiation changes (no cancer). HPV high risk is negative. Follow up as planned. Almyra Free will contact patient with results.

## 2021-01-30 ENCOUNTER — Telehealth: Payer: Self-pay | Admitting: *Deleted

## 2021-01-30 NOTE — Telephone Encounter (Signed)
Called interp. to inform fu with Dr. Sondra Come has been moved from 02-11-21 due to Dr. Sondra Come being in the OR, appt. moved to 02-14-21 @ 11:15 am, interp. Almyra Free) to let patient know

## 2021-02-11 ENCOUNTER — Ambulatory Visit: Payer: 59 | Admitting: Radiation Oncology

## 2021-02-12 ENCOUNTER — Encounter: Payer: Self-pay | Admitting: Radiology

## 2021-02-13 NOTE — Progress Notes (Signed)
Radiation Oncology         [phone] ________________________________  Name: Amy Morrow MRN: [id number]  Date: 02/14/2021  DOB: 1961/04/09  Follow-Up Visit Note  CC: Antony Blackbird, MD (Inactive)  Antony Blackbird, MD    ICD-10-CM   1. Recurrent cervical cancer (HCC)  C53.9     2. Cervical cancer, FIGO stage IB2 (HCC)  C53.9       Diagnosis:   Recurrent cervical cancer  Interval Since Last Radiation:  2 years, 2 months, 2 weeks, and 1 day ago   Radiation treatment dates:      1. IMRT: 09/27/2018 - 11/01/2018 2. HDR: 11/10/2018, 11/18/2018, 11/24/2018, 12/01/2018   Site/dose:    1. Pelvis, Vagina / 45 Gy in 25 fractions 2. Vaginal Cuff (area of recurrence) / 24 Gy in 4 fractions  Narrative:  The patient returns today for routine follow-up, the patient was last seen here on 09/06/20. The patient most recently followed up with Dr. Denman George on 12/24/20; the patient showed no symptoms concerning for disease recurrence. (Of note: PAP performed during this visit revealed ASC-US, HPV negative ).       Of note: the patient presented to the [place] twice this past year with UTI (11/20/20 & 12/12/20).   She denies any pain within the pelvis area vaginal bleeding or discharge.  She has some mild urinary issues as documented in the nursing note.  She denies any abdominal bloating.   Allergies:  has No Known Allergies.  Meds: Current Outpatient Medications  Medication Sig Dispense Refill   calcitRIOL (ROCALTROL) 0.25 MCG capsule Take 0.25 mcg by mouth 2 (two) times daily.     calcium-vitamin D (OSCAL WITH D) 500-200 MG-UNIT TABS tablet Take by mouth.     EUTHYROX 112 MCG tablet Take 112 mcg by mouth daily.     metFORMIN (GLUCOPHAGE-XR) 500 MG 24 hr tablet Take 500 mg by mouth daily with breakfast.      pravastatin (PRAVACHOL) 20 MG tablet Take by mouth.     promethazine-dextromethorphan (PROMETHAZINE-DM) 6.25-15 MG/5ML syrup Take 5 mLs by mouth at bedtime as needed for  cough. (Patient not taking: Reported on 02/14/2021) 100 mL 0   No current facility-administered medications for this encounter.    Physical Findings: The patient is in no acute distress. Patient is alert and oriented.  Exam and communication with the assistance of Spanish interpreter  height is 4\' 11"  (1.499 m) and weight is 142 lb 6 oz (64.6 kg). Her temporal temperature is 97 F (36.1 C) (abnormal). Her blood pressure is 115/74 and her pulse is 90. Her respiration is 18 and oxygen saturation is 99%. .  No significant changes. Lungs are clear to auscultation bilaterally. Heart has regular rate and rhythm. No palpable cervical, supraclavicular, or axillary adenopathy. Abdomen soft, non-tender, normal bowel sounds.  On pelvic examination the external genitalia are unremarkable.  A speculum exam is performed.  No mucosal lesions noted in the vaginal vault.  The vaginal vault is shortened consistent with prior radical hysterectomy.  Some radiation changes noted at the vaginal cuff.  Slight bleeding with exam along the cuff region.  On bimanual examination the vaginal cuff is intact.  Rectovaginal examination confirms no evidence of recurrence.  Rectal sphincter tone good.   Lab Findings: Lab Results  Component Value Date   WBC 3.1 (L) 03/17/2019   HGB 11.2 (L) 03/17/2019   HCT 34.3 (L) 03/17/2019   MCV 92.5 03/17/2019   PLT 257 03/17/2019  Radiographic Findings: No results found.  Impression:  Recurrent cervical cancer  No evidence of additional recurrence on clinical exam today.  Plan: Routine follow-up with gynecologic oncology in 3 months.  She will follow-up with radiation oncology in January 2023.   20 minutes of total time was spent for this patient encounter, including preparation, face-to-face counseling with the patient and coordination of care, physical exam, and documentation of the encounter. ____________________________________  Blair Promise, PhD, MD   This document  serves as a record of services personally performed by Gery Pray, MD. It was created on his behalf by Roney Mans, a trained medical scribe. The creation of this record is based on the scribe's personal observations and the provider's statements to them. This document has been checked and approved by the attending provider.

## 2021-02-14 ENCOUNTER — Ambulatory Visit
Admission: RE | Admit: 2021-02-14 | Discharge: 2021-02-14 | Disposition: A | Discharge status: Home / Self Care | Payer: 59 | Source: Ambulatory Visit | Attending: Radiation Oncology | Admitting: Radiation Oncology

## 2021-02-14 ENCOUNTER — Other Ambulatory Visit: Payer: Self-pay

## 2021-02-14 ENCOUNTER — Encounter: Payer: Self-pay | Admitting: Radiation Oncology

## 2021-02-14 VITALS — BP 115/74 | HR 90 | Temp 97.0°F | Resp 18 | Ht 59.0 in | Wt 142.4 lb

## 2021-02-14 DIAGNOSIS — Z923 Personal history of irradiation: Secondary | ICD-10-CM | POA: Diagnosis not present

## 2021-02-14 DIAGNOSIS — Z79899 Other long term (current) drug therapy: Secondary | ICD-10-CM | POA: Insufficient documentation

## 2021-02-14 DIAGNOSIS — C539 Malignant neoplasm of cervix uteri, unspecified: Secondary | ICD-10-CM

## 2021-02-14 DIAGNOSIS — Z87440 Personal history of urinary (tract) infections: Secondary | ICD-10-CM | POA: Insufficient documentation

## 2021-02-14 DIAGNOSIS — Z8541 Personal history of malignant neoplasm of cervix uteri: Secondary | ICD-10-CM | POA: Insufficient documentation

## 2021-02-14 DIAGNOSIS — Z7984 Long term (current) use of oral hypoglycemic drugs: Secondary | ICD-10-CM | POA: Diagnosis not present

## 2021-02-14 NOTE — Progress Notes (Signed)
Amy Morrow is here today for follow up post radiation to the pelvic.  They completed their radiation on: 12/01/2018   Does the patient complain of any of the following:  Pain:denies Abdominal bloating: denies Diarrhea/Constipation: denies Nausea/Vomiting: denies Vaginal Discharge: denies Blood in Urine or Stool: denies Urinary Issues (dysuria/incomplete emptying/ incontinence/ increased frequency/urgency): reports occasional incomplete emptying, nocturia x 1-2 which is improved from previously Does patient report using vaginal dilator 2-3 times a week and/or sexually active 2-3 weeks: no longer uses dilator    Additional comments if applicable:none    Vitals:   02/14/21 1117  BP: 115/74  Pulse: 90  Resp: 18  Temp: (!) 97 F (36.1 C)  TempSrc: Temporal  SpO2: 99%  Weight: 142 lb 6 oz (64.6 kg)  Height: 4\' 11"  (1.499 m)

## 2021-02-26 ENCOUNTER — Telehealth: Payer: Self-pay | Admitting: *Deleted

## 2021-02-26 NOTE — Telephone Encounter (Signed)
CALLED INTERP. (JULIE) TO HAVE HER CALL THIS PATIENT TO INFORM OF FU WITH DR. Denman George ON 05-01-21 - ARRIVAL TIME- 1:45 PM, SPOKE WITH JULIE AND SHE WILL TAKE CARE OF THIS

## 2021-04-10 ENCOUNTER — Ambulatory Visit (HOSPITAL_COMMUNITY)
Admission: EM | Admit: 2021-04-10 | Discharge: 2021-04-10 | Disposition: A | Discharge status: Home / Self Care | Payer: 59 | Attending: Physician Assistant | Admitting: Physician Assistant

## 2021-04-10 ENCOUNTER — Encounter (HOSPITAL_COMMUNITY): Payer: Self-pay

## 2021-04-10 DIAGNOSIS — H6121 Impacted cerumen, right ear: Secondary | ICD-10-CM | POA: Diagnosis not present

## 2021-04-10 MED ORDER — CARBAMIDE PEROXIDE 6.5 % OT SOLN: 5.0000 [drp] | Freq: Two times a day (BID) | OTIC | 0 refills | Status: DC

## 2021-04-10 NOTE — ED Triage Notes (Signed)
Pt presents with throbbing right ear pain X 1 week.

## 2021-04-10 NOTE — ED Provider Notes (Signed)
[place]    CSN: [id number] Arrival date & time: 04/10/21  0945      History   Chief Complaint Chief Complaint  Patient presents with   Otalgia    HPI Amy Morrow is a 60 y.o. female.   Patient here today for evaluation of difficulty hearing from her right ear.  She reports that her left ear is fine and denies any pain in same.  She denies any nasal congestion sore throat or cough.  Has not had any fever.  Does not report any treatment for symptoms.  The history is provided by the patient.  Otalgia Associated symptoms: hearing loss   Associated symptoms: no abdominal pain, no congestion, no cough, no diarrhea, no fever, no sore throat and no vomiting    Past Medical History:  Diagnosis Date   Abnormal Pap smear    Anemia    Cervical cancer [facility]) oncologist- dr Denman George   Stage IB1  SCCa    Dyspnea    Fatigue    History of radiation therapy 11/01/2018   IMRT 09/27/2018-11/01/2018 to Pelvis, Vagina / 45 Gy in 25 fractions  Dr Gery Pray   History of radiation therapy 12/01/2018   Vaginal Cuff (area of recurrence) / 24 Gy in 4 fractions 11/10/2018-12/01/2018  Dr Gery Pray   Hyperlipidemia    Left breast mass 2013   7x4x4 mm 6o'clock   PONV (postoperative nausea and vomiting)    Pre-diabetes    Sepsis ([place]) 03/2018   Urosepsis, Resolved   Thyroid nodule 03/25/2018   inferior right thyroid, noted on US thyroid   Ureterovaginal fistula    Urgency of urination    intermittant   Urinary frequency     Patient Active Problem List   Diagnosis Date Noted   Chemotherapy-induced nausea 10/14/2018   Pancytopenia, acquired ([place]) 10/07/2018   Weight loss, non-intentional 10/07/2018   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   Primary thyroid cancer ([place]) 09/27/2018   Urinary retention 09/16/2018   Right ureteral injury 06/24/2018   Thyroid nodule 03/25/2018   Hypotension    Anemia 03/03/2018   Sepsis ([place]) 03/02/2018   Ureterovaginal  fistula 02/18/2018   Vaginal discharge 02/16/2018   Cervical cancer, FIGO stage IB2 ([place]) 02/08/2018   Cervical cancer, FIGO stage IB1 ([place]) 02/08/2018   Abnormal uterine bleeding (AUB) 12/07/2017   Neuropathic pain [id number]   Lichen planus [id number]   Dysuria 01/25/2013   Recurrent cervical cancer ([place]) 07/29/2012   Perimenopause 07/29/2012   H/O burning pain in leg 06/24/2012   CIN I (cervical intraepithelial neoplasia I) 07/11/2011   LSIL (low grade squamous intraepithelial lesion) on Pap smear 06/11/2011    Past Surgical History:  Procedure Laterality Date   ABDOMINAL HYSTERECTOMY     BREAST BIOPSY Left 06/15/2012   CYSTOSCOPY W/ RETROGRADES Bilateral 02/16/2018   Procedure: CYSTOSCOPY WITH RETROGRADE PYELOGRAM BILATERAL DIAGNOSTIC URETEROSCOPY, BILATERAL  STENT PLACEMENT;  Surgeon: Ardis Hughs, MD;  Location: [facility];  Service: Urology;  Laterality: Bilateral;   CYSTOSCOPY W/ URETERAL STENT PLACEMENT Bilateral 04/22/2018   Procedure: CYSTOSCOPY WITH BILATERAL RETROGRADE PYELOGRAM/ AND URETERAL STENT EXCHANGE, vaginal exam;  Surgeon: Ardis Hughs, MD;  Location: [facility];  Service: Urology;  Laterality: Bilateral;   CYSTOSCOPY W/ URETERAL STENT PLACEMENT Bilateral 05/28/2018   Procedure: CYSTOSCOPY WITH BILATERAL  RETROGRADE PYELOGRAM/URETERAL STENT REPLACEMENT, REMOVAL OF BILATERAL PERCUTANEOUS DRAINS;  Surgeon: Ardis Hughs, MD;  Location: [facility];  Service: Urology;  Laterality:  Bilateral;   D & C LEEP CONIZATION BX  07-31-2003   dr p. rose [place]   IR IMAGING GUIDED PORT INSERTION  09/27/2018   IR NEPHROSTOGRAM RIGHT THRU EXISTING ACCESS  03/18/2018   IR NEPHROSTOMY EXCHANGE LEFT  04/27/2018   IR NEPHROSTOMY EXCHANGE RIGHT  04/27/2018   IR NEPHROSTOMY PLACEMENT LEFT  03/18/2018   IR NEPHROSTOMY PLACEMENT RIGHT  03/02/2018   IR REMOVAL TUN ACCESS W/ PORT W/O FL MOD SED  03/17/2019   PELVIC LYMPH NODE DISSECTION Bilateral 02/08/2018    Procedure: BILATEAL PEVIC  LYMPHADENECTOMY;  Surgeon: [name], MD;  Location: [facility];  Service: Gynecology;  Laterality: Bilateral;   ROBOTIC ASSISTED TOTAL HYSTERECTOMY WITH BILATERAL SALPINGO OOPHERECTOMY N/A 02/08/2018   Procedure: XI ROBOTIC ASSISTED TOTAL Radical HYSTERECTOMY WITH BILATERAL SALPINGO OOPHORECTOMY;  Surgeon: [name], MD;  Location[facility];  Service: Gynecology;  Laterality: N/A;   Transvaginal tape placement  03-12-2009  dr Emeterio Reeve  [facility]   GYNECARE TENSION-FREE VAGINAL TAPE SLING   TUBAL LIGATION  05-28-2005  DR  MARSHALL  @ [employer]   PPTL    OB History     Gravida  6   Para  5   Term  5   Preterm  0   AB  1   Living  5      SAB  1   IAB  0   Ectopic  0   Multiple  0   Live Births  5            Home Medications    Prior to Admission medications   Medication Sig Start Date End Date Taking? Authorizing Provider  carbamide peroxide (DEBROX) 6.5 % OTIC solution Place 5 drops into the right ear 2 (two) times daily. 04/10/21  Yes Francene Finders, [name]  calcitRIOL (ROCALTROL) 0.25 MCG capsule Take 0.25 mcg by mouth 2 (two) times daily. 03/02/20   [provider]  calcium-vitamin D (OSCAL WITH D) 500-200 MG-UNIT TABS tablet Take by mouth. 05/25/19   [provider]  EUTHYROX 112 MCG tablet Take 112 mcg by mouth daily. 06/14/19   [provider]  metFORMIN (GLUCOPHAGE-XR) 500 MG 24 hr tablet Take 500 mg by mouth daily with breakfast.  11/12/18   [provider]  pravastatin (PRAVACHOL) 20 MG tablet Take by mouth. 02/28/19   [provider]  promethazine-dextromethorphan (PROMETHAZINE-DM) 6.25-15 MG/5ML syrup Take 5 mLs by mouth at bedtime as needed for cough. Patient not taking: Reported on 02/14/2021 08/16/20   Jaynee Eagles, [name]    Family History Family History  Problem Relation Age of Onset   Hypertension Mother    Heart disease Mother    Hypertension Brother    Heart disease Brother     Social  History Social History   Tobacco Use   Smoking status: Never   Smokeless tobacco: Never  Vaping Use   Vaping Use: Never used  Substance Use Topics   Alcohol use: No   Drug use: No     Allergies   Patient has no known allergies.   Review of Systems Review of Systems  Constitutional:  Negative for chills and fever.  HENT:  Positive for ear pain and hearing loss. Negative for congestion, sinus pressure and sore throat.   Eyes:  Negative for discharge and redness.  Respiratory:  Negative for cough, shortness of breath and wheezing.   Gastrointestinal:  Negative for abdominal pain, diarrhea, nausea and vomiting.    Physical Exam Triage  Vital Signs ED Triage Vitals [04/10/21 1104]  Enc Vitals Group     BP 138/77     Pulse Rate 91     Resp 17     Temp 98.5 F (36.9 C)     Temp Source Oral     SpO2 99 %     Weight      Height      Head Circumference      Peak Flow      Pain Score 7     Pain Loc      Pain Edu?      Excl. in [place]?    No data found.  Updated Vital Signs BP 138/77 (BP Location: Left Arm)   Pulse 91   Temp 98.5 F (36.9 C) (Oral)   Resp 17   LMP 11/16/2012   SpO2 99%   Visual Acuity Right Eye Distance:   Left Eye Distance:   Bilateral Distance:    Right Eye Near:   Left Eye Near:    Bilateral Near:     Physical Exam Vitals and nursing note reviewed.  Constitutional:      General: She is not in acute distress.    Appearance: Normal appearance. She is not ill-appearing.  HENT:     Head: Normocephalic and atraumatic.     Right Ear: There is impacted cerumen.     Left Ear: Tympanic membrane normal.     Nose: No congestion.     Mouth/Throat:     Mouth: Mucous membranes are moist.     Pharynx: No oropharyngeal exudate or posterior oropharyngeal erythema.  Eyes:     Conjunctiva/sclera: Conjunctivae normal.  Cardiovascular:     Rate and Rhythm: Normal rate and regular rhythm.     Heart sounds: Normal heart sounds. No murmur  heard. Pulmonary:     Effort: Pulmonary effort is normal. No respiratory distress.     Breath sounds: Normal breath sounds. No wheezing, rhonchi or rales.  Skin:    General: Skin is warm and dry.  Neurological:     Mental Status: She is alert.  Psychiatric:        Mood and Affect: Mood normal.        Thought Content: Thought content normal.     UC Treatments / Results  Labs (all labs ordered are listed, but only abnormal results are displayed) Labs Reviewed - No data to display  EKG   Radiology No results found.  Procedures Procedures (including critical care time)  Medications Ordered in UC Medications - No data to display  Initial Impression / Assessment and Plan / UC Course  I have reviewed the triage vital signs and the nursing notes.  Pertinent labs & imaging results that were available during my care of the patient were reviewed by me and considered in my medical decision making (see chart for details).  Attempted cerumen removal in office but patient did not tolerate.  Debrox drops prescribed, and encouraged follow-up if symptoms do not improve or symptoms worsen in any way.  Final Clinical Impressions(s) / UC Diagnoses   Final diagnoses:  Impacted cerumen of right ear   Discharge Instructions   None    ED Prescriptions     Medication Sig Dispense Auth. Provider   carbamide peroxide (DEBROX) 6.5 % OTIC solution Place 5 drops into the right ear 2 (two) times daily. 15 mL Francene Finders, [name]      PDMP not reviewed this encounter.   Ewell Poe  F, [name] 04/10/21 1220

## 2021-04-16 ENCOUNTER — Telehealth: Payer: Self-pay | Admitting: *Deleted

## 2021-04-16 NOTE — Telephone Encounter (Signed)
Called and moved the patient's appt from 9/28 to 9/21

## 2021-04-17 ENCOUNTER — Other Ambulatory Visit: Payer: Self-pay

## 2021-04-17 ENCOUNTER — Ambulatory Visit (INDEPENDENT_AMBULATORY_CARE_PROVIDER_SITE_OTHER): Payer: 59 | Admitting: Family Medicine

## 2021-04-17 ENCOUNTER — Encounter: Payer: Self-pay | Admitting: Family Medicine

## 2021-04-17 VITALS — BP 124/72 | HR 91 | Temp 97.7°F | Ht <= 58 in | Wt 150.2 lb

## 2021-04-17 DIAGNOSIS — M792 Neuralgia and neuritis, unspecified: Secondary | ICD-10-CM | POA: Diagnosis not present

## 2021-04-17 DIAGNOSIS — G620 Drug-induced polyneuropathy: Secondary | ICD-10-CM | POA: Diagnosis not present

## 2021-04-17 DIAGNOSIS — H6121 Impacted cerumen, right ear: Secondary | ICD-10-CM

## 2021-04-17 DIAGNOSIS — M545 Low back pain, unspecified: Secondary | ICD-10-CM

## 2021-04-17 DIAGNOSIS — C73 Malignant neoplasm of thyroid gland: Secondary | ICD-10-CM

## 2021-04-17 DIAGNOSIS — E7841 Elevated Lipoprotein(a): Secondary | ICD-10-CM

## 2021-04-17 DIAGNOSIS — R300 Dysuria: Secondary | ICD-10-CM

## 2021-04-17 DIAGNOSIS — G8929 Other chronic pain: Secondary | ICD-10-CM

## 2021-04-17 DIAGNOSIS — T451X5A Adverse effect of antineoplastic and immunosuppressive drugs, initial encounter: Secondary | ICD-10-CM

## 2021-04-17 DIAGNOSIS — E118 Type 2 diabetes mellitus with unspecified complications: Secondary | ICD-10-CM

## 2021-04-17 DIAGNOSIS — Z9189 Other specified personal risk factors, not elsewhere classified: Secondary | ICD-10-CM

## 2021-04-17 DIAGNOSIS — E785 Hyperlipidemia, unspecified: Secondary | ICD-10-CM | POA: Insufficient documentation

## 2021-04-17 DIAGNOSIS — C539 Malignant neoplasm of cervix uteri, unspecified: Secondary | ICD-10-CM

## 2021-04-17 LAB — POCT URINALYSIS DIPSTICK
Bilirubin, UA: NEGATIVE
Glucose, UA: NEGATIVE
Ketones, UA: NEGATIVE
Nitrite, UA: NEGATIVE
Protein, UA: NEGATIVE
Spec Grav, UA: 1.01 (ref 1.010–1.025)
Urobilinogen, UA: 0.2 E.U./dL
pH, UA: 7.5 (ref 5.0–8.0)

## 2021-04-17 MED ORDER — CYCLOBENZAPRINE HCL 10 MG PO TABS: 10.0000 mg | ORAL_TABLET | Freq: Three times a day (TID) | ORAL | 0 refills | Status: DC | PRN

## 2021-04-17 NOTE — Patient Instructions (Signed)
Use Debox to soften wax

## 2021-04-18 LAB — LIPID PANEL
Cholesterol: 181 mg/dL (ref <200)
HDL: 51 mg/dL (ref >=50)
LDL Cholesterol (Calc): 100 mg/dL (calc) — ABNORMAL HIGH
Non-HDL Cholesterol (Calc): 130 mg/dL (calc) — ABNORMAL HIGH (ref <130)
Total CHOL/HDL Ratio: 3.5 (calc) (ref <5.0)
Triglycerides: 179 mg/dL — ABNORMAL HIGH (ref <150)

## 2021-04-18 LAB — COMPLETE METABOLIC PANEL WITH GFR
AG Ratio: 1.6 (calc) (ref 1.0–2.5)
ALT: 13 U/L (ref 6–29)
AST: 16 U/L (ref 10–35)
Albumin: 4.5 g/dL (ref 3.6–5.1)
Alkaline phosphatase (APISO): 73 U/L (ref 37–153)
BUN: 15 mg/dL (ref 7–25)
CO2: 30 mmol/L (ref 20–32)
Calcium: 8.5 mg/dL — ABNORMAL LOW (ref 8.6–10.4)
Chloride: 102 mmol/L (ref 98–110)
Creat: 0.68 mg/dL (ref 0.50–1.03)
Globulin: 2.9 g/dL (calc) (ref 1.9–3.7)
Glucose, Bld: 141 mg/dL — ABNORMAL HIGH (ref 65–139)
Potassium: 3.7 mmol/L (ref 3.5–5.3)
Sodium: 140 mmol/L (ref 135–146)
Total Bilirubin: 0.6 mg/dL (ref 0.2–1.2)
Total Protein: 7.4 g/dL (ref 6.1–8.1)
eGFR: 101 mL/min/{1.73_m2} (ref >=60)

## 2021-04-18 LAB — URINE CULTURE
MICRO NUMBER:: 12373508
SPECIMEN QUALITY:: ADEQUATE

## 2021-04-18 LAB — HEMOGLOBIN A1C
Hgb A1c MFr Bld: 5.9 % of total Hgb — ABNORMAL HIGH (ref <5.7)
Mean Plasma Glucose: 123 mg/dL
eAG (mmol/L): 6.8 mmol/L

## 2021-04-18 LAB — TSH: TSH: 0.33 mIU/L — ABNORMAL LOW (ref 0.40–4.50)

## 2021-04-18 NOTE — Progress Notes (Signed)
Provider:  Alain Honey, MD  Careteam: Patient Care Team: Antony Blackbird, MD (Inactive) as PCP - General (Family Medicine) [name], RN as Oncology Nurse Navigator (Oncology)  PLACE OF SERVICE:  [place] Directive information    No Known Allergies  Chief Complaint  Patient presents with   New Patient (Initial Visit)    Patient presents today for a new patient appointment.     HPI: Patient is a 60 y.o. female.  New patient appointment.  Accompanied by translator since she does not speak [place].  Her chief complaints today are decreased hearing and stopped up right ear and pain in her back.  She tries to explain the back where she had surgery in recent past.  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  She had PERC tubes in both kidneys.  By history she is left with no kidney damage and fully continent but she continues to have urinary symptoms and has had several primary care visits as well as urgent care visits with urinary frequency suggesting infection but rarely confirmed by cultures There is also a history of diabetes and hyperlipidemia but she has stopped her medicines for that. She does continue to take thyroid supplement after having thyroidectomy.  Review of Systems:  Review of Systems  Constitutional: Negative.   HENT:  Positive for hearing loss.   Respiratory: Negative.    Cardiovascular: Negative.   Gastrointestinal: Negative.   Musculoskeletal:  Positive for back pain.  All other systems reviewed and are negative.  Past Medical History:  Diagnosis Date   Abnormal Pap smear    Anemia    Cervical cancer [facility]) oncologist- dr Denman George   Stage IB1  SCCa    Dyspnea    Fatigue    History of radiation  therapy 11/01/2018   IMRT 09/27/2018-11/01/2018 to Pelvis, Vagina / 45 Gy in 25 fractions  Dr Gery Pray   History of radiation therapy 12/01/2018   Vaginal Cuff (area of recurrence) / 24 Gy in 4 fractions 11/10/2018-12/01/2018  Dr Gery Pray   Hyperlipidemia    Left breast mass 2013   7x4x4 mm 6o'clock   PONV (postoperative nausea and vomiting)    Pre-diabetes    Sepsis ([place]) 03/2018   Urosepsis, Resolved   Thyroid nodule 03/25/2018   inferior right thyroid, noted on US thyroid   Ureterovaginal fistula    Urgency of urination    intermittant   Urinary frequency    Past Surgical History:  Procedure Laterality Date   ABDOMINAL HYSTERECTOMY     BREAST BIOPSY Left 06/15/2012   CYSTOSCOPY W/ RETROGRADES Bilateral 02/16/2018   Procedure: CYSTOSCOPY WITH RETROGRADE PYELOGRAM BILATERAL DIAGNOSTIC URETEROSCOPY, BILATERAL  STENT PLACEMENT;  Surgeon: Ardis Hughs, MD;  [facility];  Service: Urology;  Laterality: Bilateral;   CYSTOSCOPY W/ URETERAL STENT PLACEMENT Bilateral 04/22/2018   Procedure: CYSTOSCOPY WITH BILATERAL RETROGRADE PYELOGRAM/ AND URETERAL STENT EXCHANGE, vaginal exam;  Surgeon: Ardis Hughs, MD;  [facility];  Service: Urology;  Laterality: Bilateral;   CYSTOSCOPY W/ URETERAL STENT PLACEMENT Bilateral 05/28/2018   Procedure: CYSTOSCOPY WITH BILATERAL  RETROGRADE PYELOGRAM/URETERAL STENT REPLACEMENT, REMOVAL OF BILATERAL PERCUTANEOUS DRAINS;  Surgeon: Ardis Hughs, MD;  Location: [facility];  Service: Urology;  Laterality: Bilateral;  D & C LEEP CONIZATION BX  07-31-2003   dr p. rose [place]   IR IMAGING GUIDED PORT INSERTION  09/27/2018   IR NEPHROSTOGRAM RIGHT THRU EXISTING ACCESS  03/18/2018   IR NEPHROSTOMY EXCHANGE LEFT  04/27/2018   IR NEPHROSTOMY EXCHANGE RIGHT  04/27/2018   IR NEPHROSTOMY PLACEMENT LEFT  03/18/2018   IR NEPHROSTOMY PLACEMENT RIGHT  03/02/2018   IR REMOVAL TUN ACCESS W/ PORT W/O FL MOD SED   03/17/2019   PELVIC LYMPH NODE DISSECTION Bilateral 02/08/2018   Procedure: BILATEAL PEVIC  LYMPHADENECTOMY;  Surgeon: [name], MD;  [facility];  Service: Gynecology;  Laterality: Bilateral;   ROBOTIC ASSISTED TOTAL HYSTERECTOMY WITH BILATERAL SALPINGO OOPHERECTOMY N/A 02/08/2018   Procedure: XI ROBOTIC ASSISTED TOTAL Radical HYSTERECTOMY WITH BILATERAL SALPINGO OOPHORECTOMY;  Surgeon: [name], MD;  [facility];  Service: Gynecology;  Laterality: N/A;   Transvaginal tape placement  03-12-2009  dr Emeterio Reeve  [facility]   GYNECARE TENSION-FREE VAGINAL TAPE SLING   TUBAL LIGATION  05-28-2005  DR  MARSHALL  @ [facility]   PPTL   Social History:   reports that she has never smoked. She has never used smokeless tobacco. She reports that she does not drink alcohol and does not use drugs.  Family History  Problem Relation Age of Onset   Hypertension Mother    Heart disease Mother    Hypertension Brother    Heart disease Brother     Medications: Patient's Medications  New Prescriptions   CYCLOBENZAPRINE (FLEXERIL) 10 MG TABLET    Take 1 tablet (10 mg total) by mouth 3 (three) times daily as needed for muscle spasms.  Previous Medications   CALCITRIOL (ROCALTROL) 0.25 MCG CAPSULE    Take 0.25 mcg by mouth 2 (two) times daily.   CALCIUM-VITAMIN D (OSCAL WITH D) 500-200 MG-UNIT TABS TABLET    Take by mouth.   CARBAMIDE PEROXIDE (DEBROX) 6.5 % OTIC SOLUTION    Place 5 drops into the right ear 2 (two) times daily.   EUTHYROX 112 MCG TABLET    Take 112 mcg by mouth daily.   METFORMIN (GLUCOPHAGE-XR) 500 MG 24 HR TABLET    Take 500 mg by mouth daily with breakfast.    PRAVASTATIN (PRAVACHOL) 20 MG TABLET    Take by mouth.   PROMETHAZINE-DEXTROMETHORPHAN (PROMETHAZINE-DM) 6.25-15 MG/5ML SYRUP    Take 5 mLs by mouth at bedtime as needed for cough.  Modified Medications   No medications on file  Discontinued Medications   No medications on file    Physical Exam:  Vitals:   04/17/21 1118  BP:  124/72  Pulse: 91  Temp: 97.7 F (36.5 C)  SpO2: 98%  Weight: 150 lb 3.2 oz (68.1 kg)  Height: '4\' 9"'  (1.448 m)   Body mass index is 32.5 kg/m. Wt Readings from Last 3 Encounters:  04/17/21 150 lb 3.2 oz (68.1 kg)  02/14/21 142 lb 6 oz (64.6 kg)  12/24/20 144 lb 3.2 oz (65.4 kg)    Physical Exam Vitals and nursing note reviewed. Exam conducted with a chaperone present.  Constitutional:      Appearance: Normal appearance.     Comments: Translator is present but even with translator it is difficult to understand her problems.  HENT:     Head: Normocephalic.     Ears:     Comments: Right EAC occluded with cerumen.  Attempted irrigation but patient did not tolerate Cardiovascular:     Rate and Rhythm: Normal rate and  regular rhythm.  Pulmonary:     Effort: Pulmonary effort is normal.     Breath sounds: Normal breath sounds.  Musculoskeletal:        General: Normal range of motion.     Cervical back: Normal range of motion and neck supple.  Neurological:     General: No focal deficit present.     Mental Status: She is alert and oriented to person, place, and time.  Psychiatric:        Mood and Affect: Mood normal.        Behavior: Behavior normal.    Labs reviewed: Basic Metabolic Panel: Recent Labs    04/17/21 1207  NA 140  K 3.7  CL 102  CO2 30  GLUCOSE 141*  BUN 15  CREATININE 0.68  CALCIUM 8.5*  TSH 0.33*   Liver Function Tests: Recent Labs    04/17/21 1207  AST 16  ALT 13  BILITOT 0.6  PROT 7.4   No results for input(s): LIPASE, AMYLASE in the last 8760 hours. No results for input(s): AMMONIA in the last 8760 hours. CBC: No results for input(s): WBC, NEUTROABS, HGB, HCT, MCV, PLT in the last 8760 hours. Lipid Panel: Recent Labs    04/17/21 1207  CHOL 181  HDL 51  LDLCALC 100*  TRIG 179*  CHOLHDL 3.5   TSH: Recent Labs    04/17/21 1207  TSH 0.33*   A1C: Lab Results  Component Value Date   HGBA1C 5.9 (H) 04/17/2021      Assessment/Plan  1. Dysuria Urinary symptoms continue we will do culture to rule out infection.  I think she might need referral back to urology for another look. - POCT urinalysis dipstick - Urine Culture  2. Neuropathic pain Secondary to chemo allegedly  3. Peripheral neuropathy due to chemotherapy ([place]) Unsure of some of her pain is related to neuropathy but difficulty with language even with translator makes it hard to really interpret her pain symptoms  4. Primary thyroid cancer ([place]) Status post thyroidectomy but continues thyroid supplement - TSH  5. Elevated lipoprotein(a) Unsure of her lipid status we will check labs today.  She had formally been on statin but has disc and continued - Lipid Panel  6. Controlled type 2 diabetes mellitus with complication, without long-term current use of insulin (HCC) Uns see today ure of her status.  She has been classified prediabetes in the past and had formally been taking metformin.  We will check a 1 - Hemoglobin A1c - CMP with eGFR([facility])  7. Impacted cerumen of right ear Attempted irrigation unsuccessfully.  I have recommended Debrox drops to soften wax.  She is to return in 2 weeks for follow-up visit  8. Chronic midline low back pain without sciatica Do not have a good sense of etiology of her back pain whether it is musculoskeletal.  There does not appear to be radiculopathy and I wonder if some of her pain is not related to urologic surgery with scarring - cyclobenzaprine (FLEXERIL) 10 MG tablet; Take 1 tablet (10 mg total) by mouth 3 (three) times daily as needed for muscle spasms.  Dispense: 30 tablet; Refill: 0  9. Colon cancer high risk With other cancers she may be at risk - Ambulatory referral to Gastroenterology  10. Cervical cancer, FIGO stage IB1 ([place]) Apparently cured after hysterectomy and radiation   Alain Honey, MD [place] [phone]

## 2021-04-23 NOTE — Progress Notes (Signed)
Follow-up Note: GYN-ONC  Consult was requested by Dr. Darron Doom, MD   CC:  Chief Complaint  Patient presents with   Cervical Cancer  history of uretero-vaginal fistulae.  Patient was seen with an interpretor.   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   Ureterovaginal fistula (bilateral)  S/p repair with Dr Louis Meckel on 06/24/18.  No residual symptoms  3.   Thyroid cancer  - S/P thyroidectomy  At [facility].  - s/p radioactive iodine  HPI: [name]  is a 60 y.o.  P5 who was initially referred in 2019 with a new diagnosis of cervical cancer.  She had been undergoing cervical cancer screening through [place] [facility]. 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 01/2012 LSIL + endometrial cells 07/2012 Pap negative 01/2013 Pap negative with HRHPV not detected 01/2015 Pap negative and HRHPV not detected  Was noting postmenopausal bleeding and saw [place] [facility] again. 12/2017 Pap negative now HRHPV detected A mass was noted on exam and she was referred to the [place] clinic where Dr. 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   She underwent a PET/CT which showed no apparent metastatic disease. On 02/08/18 she underwent robotic assisted radical hysterectomy, upper vaginectomy, bilateral pelvic lymphadenectomy.  Operative findings were significant for a 2 to 3 cm pedunculated exophytic mass from the right anterior cervix.  There was no clinical involvement of the parametrium and no suspicious nodes.  The surgery was overall fairly uncomplicated there was some increased bleeding encountered during the dissection around the posterior bladder pillars.  At the completion of the  procedure the ureters bilaterally appeared intact and were completely skeletonized in the distal third free from all parametrial tissue.  Her tumor was staged as IB2, squamous cell carcinoma of the cervix, and low risk features were noted therefore no adjuvant therapy was recommended.  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 Louis Meckel.  During that cystoscopic procedure Dr. Louis Meckel placed ureteral stents bilaterally.  On 03/02/18 she underwent right percutaneous nephrostomy tube placement. After placement of the right PCN she noted slight decrease in urinary leakage from the vagina, though still persisted, therefore placement of a left PCN was placed on 03/18/18.  She was followed by Dr Louis Meckel who performed ureteroscopy in early October, 2019 and identified resolution of left fistula and almost resolution in right.  On 06/24/18 Dr Louis Meckel performed robotic assisted bilateral ureteral reimplantation (ureteroneocystotomy).  She was seen on 08/27/17 as part of routine scheduled surveillance. She denied vaginal bleeding symptoms.  However, on vaginal examiation, a soft exophytic 1cm lesion was identified at the left vaginal fornix. It was biopsied. It was resulted as squamous cell carcinoma, consistent with recurrence.  PET showed localized recurrent disease at the left vaginal fornix and no distant sites (though a thyroid nodule was pet avid).  She was treated with salvage chemoradiation between September 27, 2018 and December 01, 2018.  This included 5 cycles of weekly radiosensitizing cisplatin 40 mg per metered squared.  Additionally which she received IMRT with 45 Gray in 25 fractions to the pelvis and vagina between therapy 24th and  November 01, 2018, and high-dose-rate vaginal cuff brachytherapy, 24 Gray in 4 fractions, delivered between April 8 and December 01, 2018.  She tolerated therapy  well and had no adverse effects.  Post treatment PET was performed (prior to her thyroidectomy) on March 02, 2019.  This revealed persistent focal hypermetabolism in the right lobe of the thyroid, no evidence of metastatic disease, previously questioned hypermetabolism along the left vaginal cuff was not appreciated. Work-up of the thyroid findings confirmed thyroid cancer and she then underwent thyroidectomy at [facility] hospital and subsequent radioactive iodine.  She was felt to have had a complete clinical response to her salvage therapy for recurrent cervical cancer and transitioned to surveillance.  Interval Hx:  She has no symptoms concerning for recurrence. Pap on 12/24/20 showed ASCUS, negative for high risk HPV. She has symptoms of urinary frequency (negative testing for UTI). Most likely attributable to her radiation.    Current Meds:  Outpatient Encounter Medications as of 04/24/2021  Medication Sig   calcitRIOL (ROCALTROL) 0.25 MCG capsule Take 0.25 mcg by mouth 2 (two) times daily.   calcium-vitamin D (OSCAL WITH D) 500-200 MG-UNIT TABS tablet Take by mouth.   carbamide peroxide (DEBROX) 6.5 % OTIC solution Place 5 drops into the right ear 2 (two) times daily.   cyclobenzaprine (FLEXERIL) 10 MG tablet Take 1 tablet (10 mg total) by mouth 3 (three) times daily as needed for muscle spasms.   EUTHYROX 112 MCG tablet Take 112 mcg by mouth daily.   metFORMIN (GLUCOPHAGE-XR) 500 MG 24 hr tablet Take 500 mg by mouth daily with breakfast.    pravastatin (PRAVACHOL) 20 MG tablet Take by mouth.   [DISCONTINUED] promethazine-dextromethorphan (PROMETHAZINE-DM) 6.25-15 MG/5ML syrup Take 5 mLs by mouth at bedtime as needed for cough.   No facility-administered encounter medications on file as of 04/24/2021.    Allergy: No Known Allergies  Social Hx:  Tobacco use: none Alcohol use: none Illicit Drug use: none Illicit IV Drug use: none  Past Surgical Hx:  Past Surgical History:   Procedure Laterality Date   ABDOMINAL HYSTERECTOMY     BREAST BIOPSY Left 06/15/2012   CYSTOSCOPY W/ RETROGRADES Bilateral 02/16/2018   Procedure: CYSTOSCOPY WITH RETROGRADE PYELOGRAM BILATERAL DIAGNOSTIC URETEROSCOPY, BILATERAL  STENT PLACEMENT;  Surgeon: Ardis Hughs, MD;  Location: [facility];  Service: Urology;  Laterality: Bilateral;   CYSTOSCOPY W/ URETERAL STENT PLACEMENT Bilateral 04/22/2018   Procedure: CYSTOSCOPY WITH BILATERAL RETROGRADE PYELOGRAM/ AND URETERAL STENT EXCHANGE, vaginal exam;  Surgeon: Ardis Hughs, MD;  Location: [facility];  Service: Urology;  Laterality: Bilateral;   CYSTOSCOPY W/ URETERAL STENT PLACEMENT Bilateral 05/28/2018   Procedure: CYSTOSCOPY WITH BILATERAL  RETROGRADE PYELOGRAM/URETERAL STENT REPLACEMENT, REMOVAL OF BILATERAL PERCUTANEOUS DRAINS;  Surgeon: Ardis Hughs, MD;  Location: [facility];  Service: Urology;  Laterality: Bilateral;   D & C LEEP CONIZATION BX  07-31-2003   dr p. rose [place]   IR IMAGING GUIDED PORT INSERTION  09/27/2018   IR NEPHROSTOGRAM RIGHT THRU EXISTING ACCESS  03/18/2018   IR NEPHROSTOMY EXCHANGE LEFT  04/27/2018   IR NEPHROSTOMY EXCHANGE RIGHT  04/27/2018   IR NEPHROSTOMY PLACEMENT LEFT  03/18/2018   IR NEPHROSTOMY PLACEMENT RIGHT  03/02/2018   IR REMOVAL TUN ACCESS W/ PORT W/O FL MOD SED  03/17/2019   PELVIC LYMPH NODE DISSECTION Bilateral 02/08/2018   Procedure: BILATEAL PEVIC  LYMPHADENECTOMY;  Surgeon: [name], MD;  Location: [facility];  Service: Gynecology;  Laterality: Bilateral;  ROBOTIC ASSISTED TOTAL HYSTERECTOMY WITH BILATERAL SALPINGO OOPHERECTOMY N/A 02/08/2018   Procedure: XI ROBOTIC ASSISTED TOTAL Radical HYSTERECTOMY WITH BILATERAL SALPINGO OOPHORECTOMY;  Surgeon: [name], MD;  Location: [facility];  Service: Gynecology;  Laterality: N/A;   Transvaginal tape placement  03-12-2009  dr Emeterio Reeve  [facility]   GYNECARE TENSION-FREE VAGINAL TAPE SLING   TUBAL LIGATION  05-28-2005  DR   MARSHALL  @ [facility]   PPTL    Past Medical Hx:  Past Medical History:  Diagnosis Date   Abnormal Pap smear    Anemia    Cervical cancer [facility]) oncologist- dr Denman George   Stage IB1  SCCa    Dyspnea    Fatigue    History of radiation therapy 11/01/2018   IMRT 09/27/2018-11/01/2018 to Pelvis, Vagina / 45 Gy in 25 fractions  Dr Gery Pray   History of radiation therapy 12/01/2018   Vaginal Cuff (area of recurrence) / 24 Gy in 4 fractions 11/10/2018-12/01/2018  Dr Gery Pray   Hyperlipidemia    Left breast mass 2013   7x4x4 mm 6o'clock   PONV (postoperative nausea and vomiting)    Pre-diabetes    Sepsis ([place]) 03/2018   Urosepsis, Resolved   Thyroid nodule 03/25/2018   inferior right thyroid, noted on US thyroid   Ureterovaginal fistula    Urgency of urination    intermittant   Urinary frequency     Past Gynecological History:   GYNECOLOGIC HISTORY:  Patient's last menstrual period was 11/16/2012. Menarche: 60 years old P 5 LMP 60 yo Contraceptive 5 years OCP HRT none  Last Pap see HPI  Family Hx:  Family History  Problem Relation Age of Onset   Hypertension Mother    Heart disease Mother    Hypertension Brother    Heart disease Brother     Review of Systems:  Review of Systems  Constitutional: Negative.   HENT:  Negative.    Eyes: Negative.   Respiratory: Negative.    Cardiovascular: Negative.   Endocrine: Negative.   Genitourinary: Negative.         No leakage of urine from vagina. + sensation of incomplete emptying.   Musculoskeletal: Negative.   Skin: Negative.   Neurological: Negative.   Hematological: Negative.   Psychiatric/Behavioral:  Negative for depression.    Vitals:  Blood pressure 120/78, pulse (!) 102, temperature 97.8 F (36.6 C), temperature source Oral, resp. rate 18, height 4\' 9"  (1.448 m), weight 153 lb (69.4 kg), last menstrual period 11/16/2012, SpO2 100 %. Body mass index is 33.11 kg/m.   Physical Exam: ECOG PERFORMANCE STATUS: 1 -  Symptomatic but completely ambulatory   General :  Well developed, 60 y.o., female in no apparent distress HEENT:  Normocephalic/atraumatic, symmetric, EOMI, eyelids normal Neck:   Supple, no masses. Well healed thyroidectomy incision. Lymphatics:  No cervical/ submandibular/ supraclavicular/ infraclavicular/ inguinal adenopathy Respiratory:  Respirations unlabored, no use of accessory muscles CV:   Deferred Breast:  Deferred Musculoskeletal: No CVA tenderness, normal muscle strength. Abdomen:  Soft, non-tender and nondistended. No evidence of hernia. No masses. Well healed incisions.  Extremities:  No lymphedema, no erythema, non-tender. 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  Neuro/Psych:  No focal motor deficit, no abnormal mental status. Normal gait. Normal affect. Alert and oriented to person, place, and time  Genito Urinary: Speculum exam reveals no blood. No lesions or palpable masses. Shortened vagina.  Rectovaginal:  No rectal lesions, mass not appreciated via rectum, no paravaginal infiltration.  Thereasa Solo, MD  04/24/2021, 11:21 AM    Cc: Darron Doom, MD (Referring Ob/Gyn)

## 2021-04-24 ENCOUNTER — Other Ambulatory Visit: Payer: Self-pay

## 2021-04-24 ENCOUNTER — Encounter: Payer: Self-pay | Admitting: Gynecologic Oncology

## 2021-04-24 ENCOUNTER — Inpatient Hospital Stay: Payer: 59 | Attending: Gynecologic Oncology | Admitting: Gynecologic Oncology

## 2021-04-24 VITALS — BP 120/78 | HR 102 | Temp 97.8°F | Resp 18 | Ht <= 58 in | Wt 153.0 lb

## 2021-04-24 DIAGNOSIS — Z8541 Personal history of malignant neoplasm of cervix uteri: Secondary | ICD-10-CM | POA: Diagnosis not present

## 2021-04-24 DIAGNOSIS — Z90710 Acquired absence of both cervix and uterus: Secondary | ICD-10-CM | POA: Diagnosis not present

## 2021-04-24 DIAGNOSIS — Z923 Personal history of irradiation: Secondary | ICD-10-CM | POA: Diagnosis not present

## 2021-04-24 DIAGNOSIS — C539 Malignant neoplasm of cervix uteri, unspecified: Secondary | ICD-10-CM

## 2021-04-24 DIAGNOSIS — Z9221 Personal history of antineoplastic chemotherapy: Secondary | ICD-10-CM | POA: Diagnosis not present

## 2021-04-24 DIAGNOSIS — Z9079 Acquired absence of other genital organ(s): Secondary | ICD-10-CM | POA: Insufficient documentation

## 2021-04-24 NOTE — Patient Instructions (Signed)
Please notify Dr Denman George at phone number [phone] if you notice vaginal bleeding, new pelvic or abdominal pains, bloating, feeling full easy, or a change in bladder or bowel function.   Your bladder is stiff after radiation meaning that it makes you need to go to the bathroom more frequently.  Dr Denman George recommends you follow-up with Dr Sondra Come in 6 months.  Dr Denman George is departing the [place] at [facility] in October, 2022. Her partners and colleagues including Dr Berline Lopes, Dr Delsa Sale and Joylene John, Nurse Practitioner will be available to continue your care.   You are next scheduled to return to the Gynecologic Oncology office at the [facility] in September, 2023. Please call [phone] in July to request an appointment for Dr Berline Lopes in September.

## 2021-05-01 ENCOUNTER — Ambulatory Visit: Payer: 59 | Admitting: Gynecologic Oncology

## 2021-05-01 ENCOUNTER — Other Ambulatory Visit: Payer: Self-pay

## 2021-05-01 ENCOUNTER — Encounter: Payer: Self-pay | Admitting: Family Medicine

## 2021-05-01 ENCOUNTER — Ambulatory Visit (INDEPENDENT_AMBULATORY_CARE_PROVIDER_SITE_OTHER): Payer: 59 | Admitting: Family Medicine

## 2021-05-01 VITALS — BP 128/74 | HR 92 | Temp 96.2°F | Ht <= 58 in | Wt 149.2 lb

## 2021-05-01 DIAGNOSIS — C539 Malignant neoplasm of cervix uteri, unspecified: Secondary | ICD-10-CM | POA: Diagnosis not present

## 2021-05-01 DIAGNOSIS — M792 Neuralgia and neuritis, unspecified: Secondary | ICD-10-CM

## 2021-05-01 DIAGNOSIS — C73 Malignant neoplasm of thyroid gland: Secondary | ICD-10-CM

## 2021-05-01 DIAGNOSIS — S3710XS Unspecified injury of ureter, sequela: Secondary | ICD-10-CM | POA: Diagnosis not present

## 2021-05-01 DIAGNOSIS — E118 Type 2 diabetes mellitus with unspecified complications: Secondary | ICD-10-CM

## 2021-05-01 DIAGNOSIS — Z8739 Personal history of other diseases of the musculoskeletal system and connective tissue: Secondary | ICD-10-CM

## 2021-05-01 NOTE — Progress Notes (Signed)
Provider:  Alain Honey, MD  Careteam: Patient Care Team: Wardell Honour, MD as PCP - General (Family Medicine) [name], RN as Oncology Nurse Navigator (Oncology)  PLACE OF SERVICE:  [place] Directive information    No Known Allergies  Chief Complaint  Patient presents with   Medical Management of Chronic Issues    Patient presents today for a 2 weeks follow-up.     HPI: Patient is a 60 y.o. female .  This lady returns today to review lab work and follow-up with her symptoms.  I noted in the electronic medical record that she saw her gynecologist since her visit here and received a good report as follow-up for her cervical cancer.  Through interpreter she is feeling much better in terms of her back pain.  She still has urinary frequency and that has been explained as a complication of surgery and radiation.  Review of her labs shows lipids to be acceptable.  LDL is 100.  A1c was 5.9 indicating prediabetes.  We discussed treatment for that in terms of watching carbs and weight loss.  We plan to follow this along at 28-month intervals to be sure she has not developed diabetes.  If she does then we would want to lower her LDL with statin but she does not need it now.  She still is status post thyroid cancer with thyroidectomy and will take replacement therapy for the rest of her life.  Review of Systems:  Review of Systems  Constitutional: Negative.   HENT:  Positive for hearing loss.        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    Cardiovascular: Negative.   All other systems reviewed and are negative.  Past Medical History:  Diagnosis Date   Abnormal Pap smear    Anemia    Cervical cancer [facility]) oncologist- dr Denman George   Stage IB1  SCCa    Dyspnea    Fatigue    History of radiation therapy 11/01/2018   IMRT 09/27/2018-11/01/2018 to Pelvis, Vagina / 45 Gy in 25 fractions  Dr Gery Pray   History of radiation therapy  12/01/2018   Vaginal Cuff (area of recurrence) / 24 Gy in 4 fractions 11/10/2018-12/01/2018  Dr Gery Pray   Hyperlipidemia    Left breast mass 2013   7x4x4 mm 6o'clock   PONV (postoperative nausea and vomiting)    Pre-diabetes    Sepsis ([place]) 03/2018   Urosepsis, Resolved   Thyroid nodule 03/25/2018   inferior right thyroid, noted on US thyroid   Ureterovaginal fistula    Urgency of urination    intermittant   Urinary frequency    Past Surgical History:  Procedure Laterality Date   ABDOMINAL HYSTERECTOMY     BREAST BIOPSY Left 06/15/2012   CYSTOSCOPY W/ RETROGRADES Bilateral 02/16/2018   Procedure: CYSTOSCOPY WITH RETROGRADE PYELOGRAM BILATERAL DIAGNOSTIC URETEROSCOPY, BILATERAL  STENT PLACEMENT;  Surgeon: Ardis Hughs, MD;  [facility];  Service: Urology;  Laterality: Bilateral;   CYSTOSCOPY W/ URETERAL STENT PLACEMENT Bilateral 04/22/2018   Procedure: CYSTOSCOPY WITH BILATERAL RETROGRADE PYELOGRAM/ AND URETERAL STENT EXCHANGE, vaginal exam;  Surgeon: Ardis Hughs, MD;  [facility];  Service: Urology;  Laterality: Bilateral;   CYSTOSCOPY W/ URETERAL STENT PLACEMENT Bilateral 05/28/2018   Procedure: CYSTOSCOPY WITH BILATERAL  RETROGRADE PYELOGRAM/URETERAL STENT REPLACEMENT, REMOVAL OF BILATERAL PERCUTANEOUS DRAINS;  Surgeon: Ardis Hughs, MD;  Location: [facility];  Service: Urology;  Laterality: Bilateral;   D & C LEEP CONIZATION BX  07-31-2003   dr p. rose [place]   IR IMAGING GUIDED PORT INSERTION  09/27/2018   IR NEPHROSTOGRAM RIGHT THRU EXISTING ACCESS  03/18/2018   IR NEPHROSTOMY EXCHANGE LEFT  04/27/2018   IR NEPHROSTOMY EXCHANGE RIGHT  04/27/2018   IR NEPHROSTOMY PLACEMENT LEFT  03/18/2018   IR NEPHROSTOMY PLACEMENT RIGHT  03/02/2018   IR REMOVAL TUN ACCESS W/ PORT W/O FL MOD SED  03/17/2019   PELVIC LYMPH NODE DISSECTION Bilateral 02/08/2018   Procedure: BILATEAL PEVIC  LYMPHADENECTOMY;  Surgeon: [name], MD;   [facility];  Service: Gynecology;  Laterality: Bilateral;   ROBOTIC ASSISTED TOTAL HYSTERECTOMY WITH BILATERAL SALPINGO OOPHERECTOMY N/A 02/08/2018   Procedure: XI ROBOTIC ASSISTED TOTAL Radical HYSTERECTOMY WITH BILATERAL SALPINGO OOPHORECTOMY;  Surgeon: [name], MD;  [facility];  Service: Gynecology;  Laterality: N/A;   Transvaginal tape placement  03-12-2009  dr Emeterio Reeve  [facility]   GYNECARE TENSION-FREE VAGINAL TAPE SLING   TUBAL LIGATION  05-28-2005  DR  MARSHALL  @ [facility]   PPTL   Social History:   reports that she has never smoked. She has never used smokeless tobacco. She reports that she does not drink alcohol and does not use drugs.  Family History  Problem Relation Age of Onset   Hypertension Mother    Heart disease Mother    Hypertension Brother    Heart disease Brother     Medications: Patient's Medications  New Prescriptions   No medications on file  Previous Medications   CALCITRIOL (ROCALTROL) 0.25 MCG CAPSULE    Take 0.25 mcg by mouth 2 (two) times daily.   CALCIUM-VITAMIN D (OSCAL WITH D) 500-200 MG-UNIT TABS TABLET    Take by mouth.   CARBAMIDE PEROXIDE (DEBROX) 6.5 % OTIC SOLUTION    Place 5 drops into the right ear 2 (two) times daily.   CYCLOBENZAPRINE (FLEXERIL) 10 MG TABLET    Take 1 tablet (10 mg total) by mouth 3 (three) times daily as needed for muscle spasms.   EUTHYROX 112 MCG TABLET    Take 112 mcg by mouth daily.   METFORMIN (GLUCOPHAGE-XR) 500 MG 24 HR TABLET    Take 500 mg by mouth daily with breakfast.    PRAVASTATIN (PRAVACHOL) 20 MG TABLET    Take by mouth.  Modified Medications   No medications on file  Discontinued Medications   No medications on file    Physical Exam:  Vitals:   05/01/21 0930  BP: 128/74  Pulse: 92  Temp: (!) 96.2 F (35.7 C)  SpO2: 97%  Weight: 149 lb 3.2 oz (67.7 kg)  Height: 4\' 9"  (1.448 m)   Body mass index is 32.29 kg/m. Wt Readings from Last 3 Encounters:  05/01/21 149 lb 3.2 oz (67.7 kg)   04/24/21 153 lb (69.4 kg)  04/17/21 150 lb 3.2 oz (68.1 kg)    Physical Exam Vitals and nursing note reviewed.  Constitutional:      Appearance: Normal appearance. She is obese.  HENT:     Ears:     Comments: Cannot visualize right TM due to cerumen impaction.  We discussed use of Debrox and then irrigation with warm water Cardiovascular:     Rate and Rhythm: Normal rate and regular rhythm.  Pulmonary:     Breath sounds: Normal breath sounds.  Musculoskeletal:        General: Normal range of motion.  Neurological:  General: No focal deficit present.     Mental Status: She is alert and oriented to person, place, and time.    Labs reviewed: Basic Metabolic Panel: Recent Labs    04/17/21 1207  NA 140  K 3.7  CL 102  CO2 30  GLUCOSE 141*  BUN 15  CREATININE 0.68  CALCIUM 8.5*  TSH 0.33*   Liver Function Tests: Recent Labs    04/17/21 1207  AST 16  ALT 13  BILITOT 0.6  PROT 7.4   No results for input(s): LIPASE, AMYLASE in the last 8760 hours. No results for input(s): AMMONIA in the last 8760 hours. CBC: No results for input(s): WBC, NEUTROABS, HGB, HCT, MCV, PLT in the last 8760 hours. Lipid Panel: Recent Labs    04/17/21 1207  CHOL 181  HDL 51  LDLCALC 100*  TRIG 179*  CHOLHDL 3.5   TSH: Recent Labs    04/17/21 1207  TSH 0.33*   A1C: Lab Results  Component Value Date   HGBA1C 5.9 (H) 04/17/2021     Assessment/Plan  1. Injury of right ureter, sequela She states that she still has questions she would like to ask of the urologist.  She accepts the fact that her bladder is small or and will require more frequent emptying as a result - Ambulatory referral to Urology  2. Cervical cancer, FIGO stage IB1 ([place]) Has seen gynecologist within the last week and given a clean bill of health as regards cervical cancer  3. Controlled type 2 diabetes mellitus with complication, without long-term current use of insulin ([place]) Per her A1c, she is  prediabetic and cannot be classified as having diabetes any longer.  We will follow this semiannually  4. H/O burning pain in leg These symptoms have lessened.  Likely due to effects of chemotherapy  5. Neuropathic pain Same as above (due to chemo)  6. Primary thyroid cancer [facility]) Status post post thyroidectomy.  We will continue on thyroid replacement   Alain Honey, MD [place] [phone]

## 2021-05-30 ENCOUNTER — Encounter: Payer: Self-pay | Admitting: Family

## 2021-05-30 ENCOUNTER — Other Ambulatory Visit: Payer: Self-pay

## 2021-05-30 ENCOUNTER — Ambulatory Visit (INDEPENDENT_AMBULATORY_CARE_PROVIDER_SITE_OTHER): Payer: 59 | Admitting: Family

## 2021-05-30 VITALS — BP 100/70 | HR 115 | Temp 97.5°F | Resp 18 | Ht <= 58 in

## 2021-05-30 DIAGNOSIS — R059 Cough, unspecified: Secondary | ICD-10-CM

## 2021-05-30 DIAGNOSIS — R630 Anorexia: Secondary | ICD-10-CM

## 2021-05-30 DIAGNOSIS — S3710XS Unspecified injury of ureter, sequela: Secondary | ICD-10-CM | POA: Diagnosis not present

## 2021-05-30 MED ORDER — ZINC GLUCONATE 50 MG PO TABS: 50.0000 mg | ORAL_TABLET | Freq: Every day | ORAL | 0 refills | Status: AC

## 2021-05-30 MED ORDER — VITAMIN C 250 MG PO TABS: 250.0000 mg | ORAL_TABLET | Freq: Two times a day (BID) | ORAL | 0 refills | Status: AC

## 2021-05-30 MED ORDER — VITAMIN D3 50 MCG (2000 UT) PO CAPS
2000.0000 [IU] | ORAL_CAPSULE | Freq: Every day | ORAL | 0 refills | Status: AC
Start: 2021-05-30 — End: 2021-06-13

## 2021-05-30 MED ORDER — DOXYCYCLINE HYCLATE 100 MG PO TABS: 100.0000 mg | ORAL_TABLET | Freq: Two times a day (BID) | ORAL | 0 refills | Status: AC

## 2021-05-30 NOTE — Progress Notes (Signed)
Provider: Tyquarius Paglia FNP-C  Wardell Honour, MD  Patient Care Team: Wardell Honour, MD as PCP - General (Family Medicine) [name], RN as Oncology Nurse Navigator (Oncology)  Extended Emergency Contact Information Primary Emergency Contact: Cannell,Serafin Address: [address]          [place],  [address] Amy Morrow Phone: [phone] Relation: Spouse Secondary Emergency Contact: Ozie, Dimaria Mobile Phone: [phone] Relation: Son  Code Status: Full Code  Goals of care: Advanced Directive information Advanced Directives 02/14/2021  Does Patient Have a Medical Advance Directive? No  Would patient like information on creating a medical advance directive? No - Patient declined     Chief Complaint  Patient presents with   Acute Visit    Cough,body aches,chills,headache,tired and runny nose      HPI:  Pt is a 60 y.o. female seen today for an acute visit for evaluation of  Headache,chills ,aches ,tired,cough and runny nose x 1 week. She is here with [facility] health interpreter Mickel Baas.Cough is non-productive.She denies any chest tightness,chest pain,palpitation or shortness of breath. Appetite is good  No contact with sick person with COVID-19 but husband has a chronic cough.  Also states was referred to urologist last month by Dr.Miller but has changed her phone not sure whether urologist has called her.would like provider to call urologist.on chart review referral noted for right ureter injury.note written by referral coordinator Kathyrn Lass states patient needs to speak with [facility] billing department before sched.telephone number was provided to patient [phone].will provide number again this visit.    Past Medical History:  Diagnosis Date   Abnormal Pap smear    Anemia    Cervical cancer [facility]) oncologist- dr Denman George   Stage IB1  SCCa    Dyspnea    Fatigue    History of radiation therapy 11/01/2018   IMRT  09/27/2018-11/01/2018 to Pelvis, Vagina / 45 Gy in 25 fractions  Dr Gery Pray   History of radiation therapy 12/01/2018   Vaginal Cuff (area of recurrence) / 24 Gy in 4 fractions 11/10/2018-12/01/2018  Dr Gery Pray   Hyperlipidemia    Left breast mass 2013   7x4x4 mm 6o'clock   PONV (postoperative nausea and vomiting)    Pre-diabetes    Sepsis ([place]) 03/2018   Urosepsis, Resolved   Thyroid nodule 03/25/2018   inferior right thyroid, noted on US thyroid   Ureterovaginal fistula    Urgency of urination    intermittant   Urinary frequency    Past Surgical History:  Procedure Laterality Date   ABDOMINAL HYSTERECTOMY     BREAST BIOPSY Left 06/15/2012   CYSTOSCOPY W/ RETROGRADES Bilateral 02/16/2018   Procedure: CYSTOSCOPY WITH RETROGRADE PYELOGRAM BILATERAL DIAGNOSTIC URETEROSCOPY, BILATERAL  STENT PLACEMENT;  Surgeon: Ardis Hughs, MD;  Location: [facility];  Service: Urology;  Laterality: Bilateral;   CYSTOSCOPY W/ URETERAL STENT PLACEMENT Bilateral 04/22/2018   Procedure: CYSTOSCOPY WITH BILATERAL RETROGRADE PYELOGRAM/ AND URETERAL STENT EXCHANGE, vaginal exam;  Surgeon: Ardis Hughs, MD;  Location: [facility];  Service: Urology;  Laterality: Bilateral;   CYSTOSCOPY W/ URETERAL STENT PLACEMENT Bilateral 05/28/2018   Procedure: CYSTOSCOPY WITH BILATERAL  RETROGRADE PYELOGRAM/URETERAL STENT REPLACEMENT, REMOVAL OF BILATERAL PERCUTANEOUS DRAINS;  Surgeon: Ardis Hughs, MD;  [facility];  Service: Urology;  Laterality: Bilateral;   D & C LEEP CONIZATION BX  07-31-2003   dr p. rose [place]   IR IMAGING GUIDED PORT INSERTION  09/27/2018   IR NEPHROSTOGRAM RIGHT THRU EXISTING ACCESS  03/18/2018   IR NEPHROSTOMY EXCHANGE LEFT  04/27/2018   IR NEPHROSTOMY EXCHANGE RIGHT  04/27/2018   IR NEPHROSTOMY PLACEMENT LEFT  03/18/2018   IR NEPHROSTOMY PLACEMENT RIGHT  03/02/2018   IR REMOVAL TUN ACCESS W/ PORT W/O FL MOD SED  03/17/2019   PELVIC LYMPH NODE  DISSECTION Bilateral 02/08/2018   Procedure: BILATEAL PEVIC  LYMPHADENECTOMY;  Surgeon: [name], MD;  Location: [facility];  Service: Gynecology;  Laterality: Bilateral;   ROBOTIC ASSISTED TOTAL HYSTERECTOMY WITH BILATERAL SALPINGO OOPHERECTOMY N/A 02/08/2018   Procedure: XI ROBOTIC ASSISTED TOTAL Radical HYSTERECTOMY WITH BILATERAL SALPINGO OOPHORECTOMY;  Surgeon: [name], MD;  Location: [facility];  Service: Gynecology;  Laterality: N/A;   Transvaginal tape placement  03-12-2009  dr Emeterio Reeve  [facility]   GYNECARE TENSION-FREE VAGINAL TAPE SLING   TUBAL LIGATION  05-28-2005  DR  MARSHALL  @ [facility]   PPTL    No Known Allergies  Outpatient Encounter Medications as of 05/30/2021  Medication Sig   Cholecalciferol (VITAMIN D3) 50 MCG (2000 UT) capsule Take 1 capsule (2,000 Units total) by mouth daily for 14 days.   doxycycline (VIBRA-TABS) 100 MG tablet Take 1 tablet (100 mg total) by mouth 2 (two) times daily for 7 days.   vitamin C (ASCORBIC ACID) 250 MG tablet Take 1 tablet (250 mg total) by mouth 2 (two) times daily for 14 days.   zinc gluconate 50 MG tablet Take 1 tablet (50 mg total) by mouth daily for 14 days.   calcitRIOL (ROCALTROL) 0.25 MCG capsule Take 0.25 mcg by mouth 2 (two) times daily.   calcium-vitamin D (OSCAL WITH D) 500-200 MG-UNIT TABS tablet Take by mouth.   carbamide peroxide (DEBROX) 6.5 % OTIC solution Place 5 drops into the right ear 2 (two) times daily.   cyclobenzaprine (FLEXERIL) 10 MG tablet Take 1 tablet (10 mg total) by mouth 3 (three) times daily as needed for muscle spasms.   EUTHYROX 112 MCG tablet Take 112 mcg by mouth daily.   metFORMIN (GLUCOPHAGE-XR) 500 MG 24 hr tablet Take 500 mg by mouth daily with breakfast.    pravastatin (PRAVACHOL) 20 MG tablet Take by mouth.   No facility-administered encounter medications on file as of 05/30/2021.    Review of Systems  Constitutional:  Positive for chills and fatigue. Negative for appetite change, fever and unexpected  weight change.  HENT:  Positive for rhinorrhea. Negative for congestion, postnasal drip, sinus pressure, sinus pain, sneezing and sore throat.   Eyes:  Negative for discharge, redness and itching.  Respiratory:  Positive for cough. Negative for chest tightness, shortness of breath and wheezing.   Cardiovascular:  Negative for chest pain, palpitations and leg swelling.  Gastrointestinal:  Negative for abdominal distention, constipation, diarrhea, nausea and vomiting.       Reports lower abd pain reseason for urology referral   Genitourinary:  Negative for difficulty urinating, dysuria, frequency and urgency.  Musculoskeletal:  Positive for back pain. Negative for arthralgias and gait problem.  Skin:  Negative for color change, pallor and rash.  Neurological:  Negative for dizziness, speech difficulty, weakness, light-headedness and headaches.  Psychiatric/Behavioral:  Negative for agitation, confusion and sleep disturbance. The patient is not nervous/anxious.    Immunization History  Administered Date(s) Administered   Influenza Split 07/11/2011   Influenza,inj,Quad PF,6-35 Mos 04/20/2019   Tdap 02/23/2018   Pertinent  Health Maintenance Due  Topic Date Due   FOOT EXAM  Never done   OPHTHALMOLOGY EXAM  Never done   URINE MICROALBUMIN  Never done   MAMMOGRAM  12/23/2019   INFLUENZA VACCINE  03/04/2021   HEMOGLOBIN A1C  10/15/2021   PAP SMEAR-Modifier  12/25/2023   Fall Risk 12/12/2020 02/14/2021 04/10/2021 04/17/2021 05/01/2021  Falls in the past year? - - - 0 0  Was there an injury with Fall? - - - 0 0  Fall Risk Category Calculator - - - 0 0  Fall Risk Category - - - Low Low  Patient Fall Risk Level Low fall risk Low fall risk Low fall risk Low fall risk Low fall risk  Patient at Risk for Falls Due to - - - No Fall Risks No Fall Risks  Fall risk Follow up - - - Falls evaluation completed;Education provided;Falls prevention discussed Falls evaluation completed;Education provided;Falls  prevention discussed   Functional Status Survey:    Vitals:   05/30/21 0958  BP: 100/70  Pulse: (!) 115  Resp: 18  Temp: (!) 97.5 F (36.4 C)  SpO2: 97%  Height: 4\' 9"  (1.448 m)   Body mass index is 32.29 kg/m. Physical Exam  Labs reviewed: Recent Labs    04/17/21 1207  NA 140  K 3.7  CL 102  CO2 30  GLUCOSE 141*  BUN 15  CREATININE 0.68  CALCIUM 8.5*   Recent Labs    04/17/21 1207  AST 16  ALT 13  BILITOT 0.6  PROT 7.4   No results for input(s): WBC, NEUTROABS, HGB, HCT, MCV, PLT in the last 8760 hours. Lab Results  Component Value Date   TSH 0.33 (L) 04/17/2021   Lab Results  Component Value Date   HGBA1C 5.9 (H) 04/17/2021   Lab Results  Component Value Date   CHOL 181 04/17/2021   HDL 51 04/17/2021   LDLCALC 100 (H) 04/17/2021   TRIG 179 (H) 04/17/2021   CHOLHDL 3.5 04/17/2021    Significant Diagnostic Results in last 30 days:  No results found.  Assessment/Plan  1. Cough, unspecified type Afebrile  Non-productive cough Left lower lower diminished breath sound.will treat clinically with Doxycycline as below  - SARS-COV-2 RNA,(COVID-19) QUAL NAAT - doxycycline (VIBRA-TABS) 100 MG tablet; Take 1 tablet (100 mg total) by mouth 2 (two) times daily for 7 days.  Dispense: 14 tablet; Refill: 0 - zinc gluconate 50 MG tablet; Take 1 tablet (50 mg total) by mouth daily for 14 days.  Dispense: 14 tablet; Refill: 0 - vitamin C (ASCORBIC ACID) 250 MG tablet; Take 1 tablet (250 mg total) by mouth 2 (two) times daily for 14 days.  Dispense: 28 tablet; Refill: 0 - Cholecalciferol (VITAMIN D3) 50 MCG (2000 UT) capsule; Take 1 capsule (2,000 Units total) by mouth daily for 14 days.  Dispense: 14 capsule; Refill: 0  2. Loss of appetite Encouraged to increase fluid intake/tea/soups - SARS-COV-2 RNA,(COVID-19) QUAL NAAT  3. Injury of right ureter sequela  Referral to urology ordered 05/01/2021 by Dr.Miller but ptchanged her phone number.Urology telephone  number as above     Family/ staff Communication: Reviewed plan of care with patient verbalized understanding via interpretor Mickel Baas   Labs/tests ordered: None   Next Appointment: As needed if symptoms worsen or fail to improve    Sandrea Hughs, NP

## 2021-05-30 NOTE — Patient Instructions (Addendum)
Here is the [facility] Number requested for billing [phone]  - increase fluid intake/tea or soup  - may take Tylenol 500 mg tablet one by mouth every 8 hours as needed for pain or fever or chills

## 2021-05-31 LAB — SARS-COV-2 RNA,(COVID-19) QUALITATIVE NAAT: SARS CoV2 RNA: NOT DETECTED

## 2021-08-21 NOTE — Progress Notes (Signed)
Radiation Oncology         [phone] ________________________________  Name: Amy Morrow MRN: [id number]  Date: 08/22/2021  DOB: 1960/08/10  Follow-Up Visit Note  CC: Wardell Honour, MD  Antony Blackbird, MD    ICD-10-CM   1. Cervical cancer, FIGO stage IB2 (HCC)  C53.9       Diagnosis:   Recurrent cervical cancer  Interval Since Last Radiation: 2 years, 8 months, and 3 weeks   Radiation treatment dates:    1. IMRT: 09/27/2018 - 11/01/2018 2. HDR: 11/10/2018, 11/18/2018, 11/24/2018, 12/01/2018   Site/dose:    1. Pelvis, Vagina / 45 Gy in 25 fractions 2. Vaginal Cuff (area of recurrence) / 24 Gy in 4 fractions  Narrative:  The patient returns today for routine follow-up (she was last seen here for follow up on 02/14/21). Since her last visit, the patient followed up with Dr. Denman George on 04/24/21. During which time,  the patient was noted as NED on examination and denied any symptoms concerning for disease recurrence. The patient did report urinary frequency though Dr. Denman George noted this as most likely attributed to RT (labs negative for UTI).     Of note: the patient followed up with her endocrinologist, Dr. Tamala Julian, on 07/04/21, in regards to her history of papillary carcinoma of the thyroid. During which time, the patient denied any concerning symptoms. 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 Tamala Julian again in 6 months.                      Pertinent imaging since the patient was last seen includes a bilateral screening mammogram on 03/30/21 which showed no evidence of malignancy in either breast.    She denies any pain within the pelvis area, abdominal bloating.  She denies any vaginal bleeding. She reports being sexually active with intercourse approximately 3-4 times per week.  She has mild pain with intercourse.  She denies any postcoital bleeding.  She reports burning sensation along the skin where her percutaneous drains  were located.  This is been persistent for several months.  Examination of this area reveals no unusual skin reaction.  Allergies:  has No Known Allergies.  Meds: Current Outpatient Medications  Medication Sig Dispense Refill   calcitRIOL (ROCALTROL) 0.25 MCG capsule Take 0.25 mcg by mouth 2 (two) times daily.     calcium-vitamin D (OSCAL WITH D) 500-200 MG-UNIT TABS tablet Take by mouth.     EUTHYROX 112 MCG tablet Take 112 mcg by mouth daily.     carbamide peroxide (DEBROX) 6.5 % OTIC solution Place 5 drops into the right ear 2 (two) times daily. (Patient not taking: Reported on 08/22/2021) 15 mL 0   cyclobenzaprine (FLEXERIL) 10 MG tablet Take 1 tablet (10 mg total) by mouth 3 (three) times daily as needed for muscle spasms. (Patient not taking: Reported on 08/22/2021) 30 tablet 0   metFORMIN (GLUCOPHAGE-XR) 500 MG 24 hr tablet Take 500 mg by mouth daily with breakfast.  (Patient not taking: Reported on 08/22/2021)     pravastatin (PRAVACHOL) 20 MG tablet Take by mouth. (Patient not taking: Reported on 08/22/2021)     No current facility-administered medications for this encounter.    Physical Findings: The patient is in no acute distress. Patient is alert and oriented.  weight is 151 lb (68.5 kg). Her temperature is 97.2 F (36.2 C) (abnormal). Her blood pressure is 126/77 and her pulse is 103 (abnormal). Her  respiration is 20 and oxygen saturation is 100%. .  No significant changes. Lungs are clear to auscultation bilaterally. Heart has regular rate and rhythm. No palpable cervical, supraclavicular, or axillary adenopathy. Abdomen soft, non-tender, normal bowel sounds.   On pelvic examination the external genitalia were unremarkable. A speculum exam was performed. There are no mucosal lesions noted in the vaginal vault. On bimanual and rectovaginal examination there were no pelvic masses appreciated.  Mild radiation changes noted in the proximal vagina.  Rectal sphincter tone good    Lab  Findings: Lab Results  Component Value Date   WBC 3.1 (L) 03/17/2019   HGB 11.2 (L) 03/17/2019   HCT 34.3 (L) 03/17/2019   MCV 92.5 03/17/2019   PLT 257 03/17/2019    Radiographic Findings: No results found.  Impression: Recurrent cervical cancer  No evidence of recurrence on clinical exam today.  She does not appear to have any lasting effects from her external beam and vaginal brachytherapy  Plan: She will follow-up with Dr. Berline Lopes in 6 months.  She will be due for Pap smear at that time.  Routine follow-up in radiation oncology in 1 year.   20 minutes of total time was spent for this patient encounter, including preparation, face-to-face counseling with the patient and coordination of care, physical exam, and documentation of the encounter. ____________________________________  Blair Promise, PhD, MD  This document serves as a record of services personally performed by Gery Pray, MD. It was created on his behalf by Roney Mans, a trained medical scribe. The creation of this record is based on the scribe's personal observations and the provider's statements to them. This document has been checked and approved by the attending provider.

## 2021-08-22 ENCOUNTER — Other Ambulatory Visit: Payer: Self-pay

## 2021-08-22 ENCOUNTER — Encounter: Payer: Self-pay | Admitting: Radiation Oncology

## 2021-08-22 ENCOUNTER — Ambulatory Visit
Admission: RE | Admit: 2021-08-22 | Discharge: 2021-08-22 | Disposition: A | Discharge status: Home / Self Care | Payer: Self-pay | Source: Ambulatory Visit | Attending: Radiation Oncology | Admitting: Radiation Oncology

## 2021-08-22 VITALS — BP 126/77 | HR 103 | Temp 97.2°F | Resp 20 | Wt 151.0 lb

## 2021-08-22 DIAGNOSIS — C539 Malignant neoplasm of cervix uteri, unspecified: Secondary | ICD-10-CM

## 2021-08-22 DIAGNOSIS — Z8542 Personal history of malignant neoplasm of other parts of uterus: Secondary | ICD-10-CM | POA: Insufficient documentation

## 2021-08-22 DIAGNOSIS — Z923 Personal history of irradiation: Secondary | ICD-10-CM | POA: Insufficient documentation

## 2021-08-22 DIAGNOSIS — Z7984 Long term (current) use of oral hypoglycemic drugs: Secondary | ICD-10-CM | POA: Insufficient documentation

## 2021-08-22 DIAGNOSIS — Z85850 Personal history of malignant neoplasm of thyroid: Secondary | ICD-10-CM | POA: Insufficient documentation

## 2021-08-22 DIAGNOSIS — R350 Frequency of micturition: Secondary | ICD-10-CM | POA: Insufficient documentation

## 2021-08-22 NOTE — Progress Notes (Signed)
Amy Morrow is here today for follow up post radiation to the pelvic.  They completed their radiation on: 12/01/18  Does the patient complain of any of the following:  Pain: No Abdominal bloating: no Diarrhea/Constipation: Patient reports having constipation.  Nausea/Vomiting: no Vaginal Discharge: no Blood in Urine or Stool: no Urinary Issues (dysuria/incomplete emptying/ incontinence/ increased frequency/urgency): no Does patient report using vaginal dilator 2-3 times a week and/or sexually active 2-3 weeks: yes Post radiation skin changes: no   Additional comments if applicable: Patient reports having a burning sensation to bilateral lower back.   Vitals:   08/22/21 1108  BP: 126/77  Pulse: (!) 103  Resp: 20  Temp: (!) 97.2 F (36.2 C)  SpO2: 100%  Weight: 151 lb (68.5 kg)

## 2021-10-23 ENCOUNTER — Ambulatory Visit (INDEPENDENT_AMBULATORY_CARE_PROVIDER_SITE_OTHER): Payer: Self-pay | Admitting: Family

## 2021-10-23 ENCOUNTER — Other Ambulatory Visit: Payer: Self-pay

## 2021-10-23 ENCOUNTER — Encounter: Payer: Self-pay | Admitting: Family

## 2021-10-23 VITALS — BP 110/80 | HR 100 | Temp 97.6°F | Resp 16 | Ht <= 58 in | Wt 160.4 lb

## 2021-10-23 DIAGNOSIS — R350 Frequency of micturition: Secondary | ICD-10-CM

## 2021-10-23 DIAGNOSIS — R300 Dysuria: Secondary | ICD-10-CM

## 2021-10-23 LAB — POCT URINALYSIS DIPSTICK
Bilirubin, UA: NEGATIVE
Glucose, UA: NEGATIVE
Ketones, UA: POSITIVE
Nitrite, UA: NEGATIVE
Protein, UA: NEGATIVE
Spec Grav, UA: 1.01 (ref 1.010–1.025)
Urobilinogen, UA: NEGATIVE E.U./dL — AB
pH, UA: 7.5 (ref 5.0–8.0)

## 2021-10-23 NOTE — Patient Instructions (Signed)
Increase water intake to 6-8 glasses daily  ? ?Urinary Tract Infection, Adult ?A urinary tract infection (UTI) is an infection of any part of the urinary tract. The urinary tract includes the kidneys, ureters, bladder, and urethra. These organs make, store, and get rid of urine in the body. ?An upper UTI affects the ureters and kidneys. A lower UTI affects the bladder and urethra. ?What are the causes? ?Most urinary tract infections are caused by bacteria in your genital area around your urethra, where urine leaves your body. These bacteria grow and cause inflammation of your urinary tract. ?What increases the risk? ?You are more likely to develop this condition if: ?You have a urinary catheter that stays in place. ?You are not able to control when you urinate or have a bowel movement (incontinence). ?You are female and you: ?Use a spermicide or diaphragm for birth control. ?Have low estrogen levels. ?Are pregnant. ?You have certain genes that increase your risk. ?You are sexually active. ?You take antibiotic medicines. ?You have a condition that causes your flow of urine to slow down, such as: ?An enlarged prostate, if you are female. ?Blockage in your urethra. ?A kidney stone. ?A nerve condition that affects your bladder control (neurogenic bladder). ?Not getting enough to drink, or not urinating often. ?You have certain medical conditions, such as: ?Diabetes. ?A weak disease-fighting system (immunesystem). ?Sickle cell disease. ?Gout. ?Spinal cord injury. ?What are the signs or symptoms? ?Symptoms of this condition include: ?Needing to urinate right away (urgency). ?Frequent urination. This may include small amounts of urine each time you urinate. ?Pain or burning with urination. ?Blood in the urine. ?Urine that smells bad or unusual. ?Trouble urinating. ?Cloudy urine. ?Vaginal discharge, if you are female. ?Pain in the abdomen or the lower back. ?You may also have: ?Vomiting or a decreased  appetite. ?Confusion. ?Irritability or tiredness. ?A fever or chills. ?Diarrhea. ?The first symptom in older adults may be confusion. In some cases, they may not have any symptoms until the infection has worsened. ?How is this diagnosed? ?This condition is diagnosed based on your medical history and a physical exam. You may also have other tests, including: ?Urine tests. ?Blood tests. ?Tests for STIs (sexually transmitted infections). ?If you have had more than one UTI, a cystoscopy or imaging studies may be done to determine the cause of the infections. ?How is this treated? ?Treatment for this condition includes: ?Antibiotic medicine. ?Over-the-counter medicines to treat discomfort. ?Drinking enough water to stay hydrated. ?If you have frequent infections or have other conditions such as a kidney stone, you may need to see a health care provider who specializes in the urinary tract (urologist). ?In rare cases, urinary tract infections can cause sepsis. Sepsis is a life-threatening condition that occurs when the body responds to an infection. Sepsis is treated in the hospital with IV antibiotics, fluids, and other medicines. ?Follow these instructions at home: ?Medicines ?Take over-the-counter and prescription medicines only as told by your health care provider. ?If you were prescribed an antibiotic medicine, take it as told by your health care provider. Do not stop using the antibiotic even if you start to feel better. ?General instructions ?Make sure you: ?Empty your bladder often and completely. Do not hold urine for long periods of time. ?Empty your bladder after sex. ?Wipe from front to back after urinating or having a bowel movement if you are female. Use each tissue only one time when you wipe. ?Drink enough fluid to keep your urine pale yellow. ?Keep all follow-up  visits. This is important. ?Contact a health care provider if: ?Your symptoms do not get better after 1-2 days. ?Your symptoms go away and then  return. ?Get help right away if: ?You have severe pain in your back or your lower abdomen. ?You have a fever or chills. ?You have nausea or vomiting. ?Summary ?A urinary tract infection (UTI) is an infection of any part of the urinary tract, which includes the kidneys, ureters, bladder, and urethra. ?Most urinary tract infections are caused by bacteria in your genital area. ?Treatment for this condition often includes antibiotic medicines. ?If you were prescribed an antibiotic medicine, take it as told by your health care provider. Do not stop using the antibiotic even if you start to feel better. ?Keep all follow-up visits. This is important. ?This information is not intended to replace advice given to you by your health care provider. Make sure you discuss any questions you have with your health care provider. ?Document Revised: 03/02/2020 Document Reviewed: 03/02/2020 ?Elsevier Patient Education ? [employer]. ? ?

## 2021-10-23 NOTE — Progress Notes (Signed)
? ?Provider: Marlowe Sax FNP-C ? ?Wardell Honour, MD ? ?Patient Care Team: ?Wardell Honour, MD as PCP - General (Family Medicine) ?[name], RN as Oncology Nurse Navigator (Oncology) ? ?Extended Emergency Contact Information ?Primary Emergency Contact: Muldrow,Serafin ?Address: [address] ?         [place], [place] [address] [place] ?Home Phone: [phone] ?Relation: Spouse ?Secondary Emergency Contact: Chapman Fitch ?Mobile Phone: [phone] ?Relation: Son ? ?Code Status:  Full Code  ?Goals of care: Advanced Directive information ? ?  10/23/2021  ?  9:18 AM  ?Advanced Directives  ?Does Patient Have a Medical Advance Directive? No  ?Would patient like information on creating a medical advance directive? No - Patient declined  ? ? ? ?Chief Complaint  ?Patient presents with  ? Acute Visit  ?  Patient complains of urinary frequency, dysuria, and back pain for 2 days.  ? ? ?HPI:  ?Pt is a 61 y.o. female seen today for an acute visit for evaluation of urine frequency, dysuria, and lower back pain for the past 2 days. ?denies any fever,chills,nausea,vomiting,abdominal pain,flank pain,urgency,frequency,dysuria,difficult urination or hematuria. ?Also reports ongoing bilateral back pain reflecting to lower abdominal pain since last year.States had a total abdominal hysterectomy due to cancer.  Currently following up for radiation with Dr. Gery Pray.  Also follows up with gynecologist Dr.[name] Terrence Dupont.  On chart review, had abdominal CT scan done in 2019 recommended scheduling appointment with gynecologist for further evaluation. ?Has also seen urologist and x-ray done was negative per patient. ? ?Past Medical History:  ?Diagnosis Date  ? Abnormal Pap smear   ? Anemia   ? Cervical cancer [facility]) oncologist- dr Denman George  ? Stage IB1  SCCa   ? Dyspnea   ? Fatigue   ? History of radiation therapy 11/01/2018  ? IMRT 09/27/2018-11/01/2018 to Pelvis, Vagina / 45 Gy in 25 fractions  Dr Gery Pray   ? History of radiation therapy 12/01/2018  ? Vaginal Cuff (area of recurrence) / 24 Gy in 4 fractions 11/10/2018-12/01/2018  Dr Gery Pray  ? Hyperlipidemia   ? Left breast mass 2013  ? 7x4x4 mm 6o'clock  ? PONV (postoperative nausea and vomiting)   ? Pre-diabetes   ? Sepsis ([place]) 03/2018  ? Urosepsis, Resolved  ? Thyroid nodule 03/25/2018  ? inferior right thyroid, noted on US thyroid  ? Ureterovaginal fistula   ? Urgency of urination   ? intermittant  ? Urinary frequency   ? ?Past Surgical History:  ?Procedure Laterality Date  ? ABDOMINAL HYSTERECTOMY    ? BREAST BIOPSY Left 06/15/2012  ? CYSTOSCOPY W/ RETROGRADES Bilateral 02/16/2018  ? Procedure: CYSTOSCOPY WITH RETROGRADE PYELOGRAM BILATERAL DIAGNOSTIC URETEROSCOPY, BILATERAL  STENT PLACEMENT;  Surgeon: Ardis Hughs, MD;  Location: [facility];  Service: Urology;  Laterality: Bilateral;  ? CYSTOSCOPY W/ URETERAL STENT PLACEMENT Bilateral 04/22/2018  ? Procedure: CYSTOSCOPY WITH BILATERAL RETROGRADE PYELOGRAM/ AND URETERAL STENT EXCHANGE, vaginal exam;  Surgeon: Ardis Hughs, MD;  [facility];  Service: Urology;  Laterality: Bilateral;  ? CYSTOSCOPY W/ URETERAL STENT PLACEMENT Bilateral 05/28/2018  ? Procedure: CYSTOSCOPY WITH BILATERAL  RETROGRADE PYELOGRAM/URETERAL STENT REPLACEMENT, REMOVAL OF BILATERAL PERCUTANEOUS DRAINS;  Surgeon: Ardis Hughs, MD;  Location: [facility];  Service: Urology;  Laterality: Bilateral;  ? D & C LEEP CONIZATION BX  07-31-2003   dr p. rose [facility]  ? IR IMAGING GUIDED PORT INSERTION  09/27/2018  ? IR NEPHROSTOGRAM RIGHT THRU EXISTING  ACCESS  03/18/2018  ? IR NEPHROSTOMY EXCHANGE LEFT  04/27/2018  ? IR NEPHROSTOMY EXCHANGE RIGHT  04/27/2018  ? IR NEPHROSTOMY PLACEMENT LEFT  03/18/2018  ? IR NEPHROSTOMY PLACEMENT RIGHT  03/02/2018  ? IR REMOVAL TUN ACCESS W/ PORT W/O FL MOD SED  03/17/2019  ? PELVIC LYMPH NODE DISSECTION Bilateral 02/08/2018  ? Procedure: BILATEAL PEVIC  LYMPHADENECTOMY;   Surgeon: [name], MD;  Location: [facility];  Service: Gynecology;  Laterality: Bilateral;  ? ROBOTIC ASSISTED TOTAL HYSTERECTOMY WITH BILATERAL SALPINGO OOPHERECTOMY N/A 02/08/2018  ? Procedure: XI ROBOTIC ASSISTED TOTAL Radical HYSTERECTOMY WITH BILATERAL SALPINGO OOPHORECTOMY;  Surgeon: [name], MD;  Location: [facility];  Service: Gynecology;  Laterality: N/A;  ? Transvaginal tape placement  03-12-2009  dr Emeterio Reeve  [facility]  ? GYNECARE TENSION-FREE VAGINAL TAPE SLING  ? TUBAL LIGATION  05-28-2005  DR  MARSHALL  @ [facility]  ? PPTL  ? ? ?No Known Allergies ? ?Outpatient Encounter Medications as of 10/23/2021  ?Medication Sig  ? calcitRIOL (ROCALTROL) 0.25 MCG capsule Take 0.25 mcg by mouth 2 (two) times daily.  ? calcium-vitamin D (OSCAL WITH D) 500-200 MG-UNIT TABS tablet Take by mouth.  ? carbamide peroxide (DEBROX) 6.5 % OTIC solution Place 5 drops into the right ear 2 (two) times daily.  ? EUTHYROX 112 MCG tablet Take 112 mcg by mouth daily.  ? [DISCONTINUED] cyclobenzaprine (FLEXERIL) 10 MG tablet Take 1 tablet (10 mg total) by mouth 3 (three) times daily as needed for muscle spasms. (Patient not taking: Reported on 08/22/2021)  ? [DISCONTINUED] metFORMIN (GLUCOPHAGE-XR) 500 MG 24 hr tablet Take 500 mg by mouth daily with breakfast.  (Patient not taking: Reported on 08/22/2021)  ? [DISCONTINUED] pravastatin (PRAVACHOL) 20 MG tablet Take by mouth. (Patient not taking: Reported on 08/22/2021)  ? ?No facility-administered encounter medications on file as of 10/23/2021.  ? ? ?Review of Systems  ?Constitutional:  Negative for appetite change, chills, fatigue, fever and unexpected weight change.  ?Respiratory:  Negative for cough, chest tightness, shortness of breath and wheezing.   ?Cardiovascular:  Negative for chest pain, palpitations and leg swelling.  ?Gastrointestinal:  Positive for abdominal distention. Negative for abdominal pain, blood in stool, constipation, diarrhea, nausea and vomiting.  ?Genitourinary:  Positive for  dysuria and frequency. Negative for difficulty urinating, flank pain and urgency.  ?Musculoskeletal:  Positive for back pain. Negative for arthralgias, gait problem and joint swelling.  ? ?Immunization History  ?Administered Date(s) Administered  ? Influenza Split 07/11/2011  ? Influenza,inj,Quad PF,6-35 Mos 04/20/2019  ? Tdap 02/23/2018  ? ?Pertinent  Health Maintenance Due  ?Topic Date Due  ? FOOT EXAM  Never done  ? OPHTHALMOLOGY EXAM  Never done  ? URINE MICROALBUMIN  Never done  ? MAMMOGRAM  12/23/2019  ? INFLUENZA VACCINE  03/04/2021  ? HEMOGLOBIN A1C  10/15/2021  ? PAP SMEAR-Modifier  12/25/2023  ? ? ?  04/10/2021  ? 11:05 AM 04/17/2021  ? 11:09 AM 05/01/2021  ?  9:32 AM 08/22/2021  ? 11:10 AM 10/23/2021  ?  9:17 AM  ?Fall Risk  ?Falls in the past year?  0 0  0  ?Was there an injury with Fall?  0 0  0  ?Fall Risk Category Calculator  0 0  0  ?Fall Risk Category  Low Low  Low  ?Patient Fall Risk Level Low fall risk Low fall risk Low fall risk Low fall risk Low fall risk  ?Patient at Risk for Falls Due to  No Fall  Risks No Fall Risks  No Fall Risks  ?Fall risk Follow up  Falls evaluation completed;Education provided;Falls prevention discussed Falls evaluation completed;Education provided;Falls prevention discussed  Falls evaluation completed  ? ?Functional Status Survey: ?  ? ?Vitals:  ? 10/23/21 1449  ?BP: 110/80  ?Pulse: 100  ?Resp: 16  ?Temp: 97.6 ?F (36.4 ?C)  ?SpO2: 97%  ?Weight: 160 lb 6.4 oz (72.8 kg)  ?Height: '4\' 9"'$  (1.448 m)  ? ?Body mass index is 34.71 kg/m?Marland Kitchen ?Physical Exam ?Vitals reviewed.  ?Constitutional:   ?   General: She is not in acute distress. ?   Appearance: Normal appearance. She is normal weight. She is not ill-appearing or diaphoretic.  ?HENT:  ?   Nose: Nose normal. No congestion or rhinorrhea.  ?Eyes:  ?   General: No scleral icterus.    ?   Right eye: No discharge.     ?   Left eye: No discharge.  ?   Conjunctiva/sclera: Conjunctivae normal.  ?   Pupils: Pupils are equal, round, and  reactive to light.  ?Cardiovascular:  ?   Rate and Rhythm: Normal rate and regular rhythm.  ?   Pulses: Normal pulses.  ?   Heart sounds: Normal heart sounds. No murmur heard. ?  No friction rub. No gallop.  ?

## 2021-10-25 ENCOUNTER — Other Ambulatory Visit: Payer: Self-pay

## 2021-10-25 LAB — URINE CULTURE
MICRO NUMBER:: 13165304
SPECIMEN QUALITY:: ADEQUATE

## 2021-10-25 MED ORDER — CIPROFLOXACIN HCL 500 MG PO TABS: 500.0000 mg | ORAL_TABLET | Freq: Two times a day (BID) | ORAL | 0 refills | Status: AC

## 2021-10-25 MED ORDER — SACCHAROMYCES BOULARDII 250 MG PO CAPS: 250.0000 mg | ORAL_CAPSULE | Freq: Two times a day (BID) | ORAL | 0 refills | Status: AC

## 2021-10-30 ENCOUNTER — Ambulatory Visit: Payer: 59 | Admitting: Family Medicine

## 2021-10-31 ENCOUNTER — Telehealth: Payer: Self-pay | Admitting: *Deleted

## 2021-10-31 NOTE — Telephone Encounter (Signed)
Attempted to reach pt but unable to. Left message requesting a call back with the assistance of spanish interrupter Amy Morrow.   ?

## 2021-11-04 ENCOUNTER — Inpatient Hospital Stay: Payer: BLUE CROSS/BLUE SHIELD

## 2021-11-04 ENCOUNTER — Inpatient Hospital Stay (HOSPITAL_COMMUNITY): Admit: 2021-11-04 | Payer: BLUE CROSS/BLUE SHIELD

## 2021-11-04 ENCOUNTER — Other Ambulatory Visit: Payer: Self-pay

## 2021-11-04 ENCOUNTER — Encounter: Payer: Self-pay | Admitting: Gynecologic Oncology

## 2021-11-04 ENCOUNTER — Inpatient Hospital Stay: Payer: BLUE CROSS/BLUE SHIELD | Attending: Gynecologic Oncology | Admitting: Gynecologic Oncology

## 2021-11-04 VITALS — BP 134/88 | HR 103 | Temp 98.8°F | Resp 16 | Ht <= 58 in | Wt 160.2 lb

## 2021-11-04 DIAGNOSIS — Z9221 Personal history of antineoplastic chemotherapy: Secondary | ICD-10-CM | POA: Insufficient documentation

## 2021-11-04 DIAGNOSIS — Z85850 Personal history of malignant neoplasm of thyroid: Secondary | ICD-10-CM | POA: Diagnosis not present

## 2021-11-04 DIAGNOSIS — Z923 Personal history of irradiation: Secondary | ICD-10-CM | POA: Diagnosis not present

## 2021-11-04 DIAGNOSIS — Z90710 Acquired absence of both cervix and uterus: Secondary | ICD-10-CM | POA: Diagnosis not present

## 2021-11-04 DIAGNOSIS — Z9079 Acquired absence of other genital organ(s): Secondary | ICD-10-CM | POA: Diagnosis not present

## 2021-11-04 DIAGNOSIS — M792 Neuralgia and neuritis, unspecified: Secondary | ICD-10-CM

## 2021-11-04 DIAGNOSIS — C539 Malignant neoplasm of cervix uteri, unspecified: Secondary | ICD-10-CM

## 2021-11-04 DIAGNOSIS — Z8541 Personal history of malignant neoplasm of cervix uteri: Secondary | ICD-10-CM | POA: Diagnosis not present

## 2021-11-04 DIAGNOSIS — Z90722 Acquired absence of ovaries, bilateral: Secondary | ICD-10-CM | POA: Insufficient documentation

## 2021-11-04 LAB — BASIC METABOLIC PANEL - CANCER CENTER ONLY
Anion gap: 9 (ref 5–15)
BUN: 23 mg/dL — ABNORMAL HIGH (ref 6–20)
CO2: 27 mmol/L (ref 22–32)
Calcium: 8.1 mg/dL — ABNORMAL LOW (ref 8.9–10.3)
Chloride: 105 mmol/L (ref 98–111)
Creatinine: 0.66 mg/dL (ref 0.44–1.00)
GFR, Estimated: >60 mL/min (ref >60)
Glucose, Bld: 101 mg/dL — ABNORMAL HIGH (ref 70–99)
Potassium: 3.6 mmol/L (ref 3.5–5.1)
Sodium: 141 mmol/L (ref 135–145)

## 2021-11-04 NOTE — Patient Instructions (Signed)
It was nice to meet you today.  We will have a phone visit shortly after your CT scan.  I do not see or feel any evidence of cancer recurrence on your exam.  I also performed a Pap test today which will be back in about a week.  You will get a call from my office with these results. ? ?I do not have a good explanation for why you are having the burning sensation along your back.  If the CT scan does not show an obvious reason for this and shows no evidence of cancer recurrence, we discussed 2 options today including physical therapy or trying a medication that can help with nerve related symptoms like pain and burning. ? ?From a cancer standpoint, we will continue with visits every 6 months until you are 5 years out from finishing treatment. ?

## 2021-11-04 NOTE — Progress Notes (Signed)
Gynecologic Oncology Return Clinic Visit ? ?11/04/21 ? ?Reason for Visit: Surveillance visit in the setting of a history of recurrent cervix cancer ? 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  ?Recurrent cervical cancer ([place])  ?06/11/2011 Pathology Results  ? 1. Endocervix, curettage ?DETACHED FRAGMENTS OF SQUAMOUS MUCOSA WITH KOILOCYTIC ATYPIA, SEE COMMENT. ?2. Cervix, biopsy ?LOW GRADE SQUAMOUS INTRAEPITHELIAL LESION, CIN-I (MILD DYSPLASIA), SEE COMMENT. ?Microscopic Comment ?1. 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 ?2. The findings ?  ?01/09/2012 Pathology Results  ? LOW GRADE SQUAMOUS INTRAEPITHELIAL LESION: CIN-1/ HPV (LSIL). ?ENDOMETRIAL CELLS ARE PRESENT. ?  ?01/12/2018 Pathology Results  ? Cervix, biopsy ?- INVASIVE SQUAMOUS CELL CARCINOMA ?- SEE COMMENT ?  ?02/08/2018 Pathology Results  ? 1. Lymph nodes, regional resection, right pelvic ?- EIGHT LYMPH NODES, NEGATIVE FOR CARCINOMA (0/8). ?2. Lymph nodes, regional resection, left pelvic ?- TWELVE LYMPH NODES, NEGATIVE FOR CARCINOMA (0/12). ?3. Uterus +/- tubes/ovaries, neoplastic, cervix, bilateral tubes and ovaries, upper third of vagina ?- INVASIVE MODERATELY DIFFERENTIATED SQUAMOUS CELL CARCINOMA, 2.6 CM, INVOLVING ANTERIOR ?PORTION OF CERVIX FROM 9 O'CLOCK TO 3 O'CLOCK. ?- TUMOR INVADES FOR DEPTH OF 0.5 CM AND IS CONFINED TO THE CERVIX. ?- ALL RESECTION MARGINS ARE NEGATIVE FOR CARCINOMA; THE CLOSEST IS THE VAGINAL CUFF MARGIN AT 1 CM. ?- NEGATIVE FOR LYMPHOVASCULAR OR PERINEURAL INVASION. ?- BENIGN ENDOMETRIAL POLYP, 2.6 CM. ?- BENIGN LEIOMYOMA, 2.2 CM ?- BENIGN INACTIVE ENDOMETRIUM. ?- BENIGN BILATERAL OVARIES AND FALLOPIAN TUBES. ?- SEE ONCOLOGY TABLE. ?Microscopic Comment ?3. UTERINE CERVIX: Resection ?Procedure: Radial hysterectomy. ?Tumor Size: 2.6 cm. ?Histologic Type: Squamous cell carcinoma. ?Histologic Grade: G2: Moderately  differentiated. ?Stromal Invasion: ?Depth of stromal invasion (millimeters): 5 mm. ?Horizontal extent longitudinal/length (if applicable#) (millimeters): 20 mm. ?Horizontal extent circumferential/width (if applicable#) (millimeters): 26 mm. ?Other Tissue/ Organ: Not involved. ?Margins: Negative for carcinoma. ?Lymphovascular Invasion: Not identified. ?Regional Lymph Nodes: All lymph nodes negative for tumor cells ?Total Number of Lymph Nodes Examined: 20. ?Number of Sentinel Nodes Examined (if applicable): 0. ?Pathologic Stage Classification (pTNM, AJCC 8th Edition): pT1b1, pN0. ?Ancillary Studies: Not applicable. ?Representative Tumor Block: 3E, 58F, and 3G. ?  ?02/08/2018 Surgery  ? Operation: Robotic-assisted type III radical laparoscopic hysterectomy with bilateral salpingoophorectomy and bilateral pelvic lymphadenectomy ?  ?Surgeon: Donaciano Eva ?   ?Operative Findings:  : 2-3cm pedunculated exophytic mass from right anterior cervix. No clinical involvement of the parametria, no suspicious nodes ?  ?02/16/2018 Surgery  ? Procedure: ?Cystoscopy, bilateral retrograde pyelograms with interpretation ?Diagnostic ureteroscopy ?Bilateral ureteral stent placement ?  ?Surgeon: Ardis Hughs, MD ?   ?Intraoperative findings:  ?#1: The retrograde pyelogram on the patient's right side initially demonstrated significant contrast extravasation several centimeters from the ureteral orifice.  There was proximal hydroureteronephrosis. ?#2: Ureteroscopy of the right ureter demonstrated a full-thickness ureteral injury likely thermal in nature approximately 2-1/2 cm from the UVJ. ?#3: The retrograde on the patient's left side demonstrated severe extravasation with no contrast getting beyond the first centimeter of the ureter.  Once beyond this area with a ureteroscope the ureter was mild to moderately dilated. ?4.:  Ureteroscopy of the left ureter demonstrated an area approximately 2 cm from the UVJ and lasting  approximately a centimeter and a half of devitalized tissue, full-thickness, likely thermal in nature. ?#5: 5 French x22 cm Polaris stents were placed bilaterally. ?  ?  ?03/22/2018 Imaging  ? Highly suspicious nodule in the inferior right thyroid lobe ?corresponds  with the hypermetabolic activity on the PET-CT. ?Recommend ultrasound-guided biopsy of this right thyroid nodule. ?  ?No other thyroid nodules. ?  ?04/22/2018 Surgery  ? Procedure: ?Cystoscopy ?Bilateral retrograde pyelogram with interpretation ?Bilateral ureteral stent exchange ?Left ureteroscopy, diagnostic ?  ?Surgeon: Ardis Hughs, MD ?   ?Intraoperative findings:  ?#1: On speculum exam there was a tine from the right ureteral stent (Polaris) noted at the vaginal cuff.  The remainder of the vaginal cuff was healed, including the area around the ureteral stent tine. ?2.:  The right retrograde pyelogram was performed using 10 cc of Omni, and demonstrated normal caliber ureter with no significant hydroureteronephrosis.  There did not appear to be any communication or extravasation of contrast in the distal ureter or UVJ with the vagina. ?3.:  The left retrograde pyelogram demonstrated a small area of extravasation at the UVJ, nearly the intramural ureter, that seemed to communicate with the patient's vagina.  There were no other significant filling defects, there is no hydroureteronephrosis. ?4.:  Left diagnostic ureteroscopy demonstrated an abnormality within the intramural or very distal UVJ region where there was some bullous edema and likely the site of the fistula.  There was no identifiable communication.  The remaining aspect of the ureter was normal. ?  ?05/28/2018 Surgery  ? Procedure: ?Bilateral retrograde pyelogram with interpretation ?Bilateral diagnostic ureteroscopy ?Bilateral ureteral stent exchange ?Bilateral nephrostomy tube removal ?  ?Surgeon: Ardis Hughs, MD ?   ?Intraoperative findings:  ?#1: 5 [place] open-ended ureteral  catheter was used to perform a retrograde pyelogram in the patient's left ureter which demonstrated normal caliber ureter with no hydro-.  However, there was a narrowed segment at the intramural ureter and just at the UVJ with a prominent fistula draining into the vagina. ?#2: I removed the wire to see the how well the ureter with drain, it did not drain well at all.  As such I tried to repassed the wire and ultimately had to pass a ureteroscope through the distal ureter in order to get the wire up into the left renal pelvis.  This noted distorted and abnormal mucosa in the intravesical and ureterovesical junction. ?#3: The patient's right sided ureteral stent distal had migrated into the patient's vagina.  In order to remove the stent I had to perform ureteroscopy on the right ureter cannulating the right ureteral orifice and advancing it through the intramural ureter and beyond the distal ureter.  Once I was up beyond the fistula I passed the wire through the scope and into the right renal pelvis.  Once the wire was in the pelvis I removed the scope and pulled the stent through the patient's vagina. ?#4: I then exchanged the 0.38 sensor wire in the right collecting system for a 5 [place] open-ended ureteral catheter performed retrograde pyelogram which demonstrated prominent fistula in the distal ureter/UVJ. ?#5: A 24 cm x 6 French double-J ureteral stent was placed in the right ureter. ?#6: A 22 cm x 5 French Polaris catheter was placed in the patient's left ureter ?  ?07/02/2018 Surgery  ? Procedure: ?Robotic assisted laparoscopic bilateral ureteral reimplant ?  ?Surgeon: Ardis Hughs, MD ?First Assistant: Dr. [name], MD ?   ?Intraoperative findings:  ?#1: The patient's ureters were thickened, and adherent to the sidewalls, but there were able to be reimplanted without any tension. ?#2: The patient had a mid urethral sling mesh noted around the suprapubic bone that was unmolested. ?  ?08/27/2018  Pathology Results  ? Vagina,  biopsy, left upper vaginal side - wall ?- SQUAMOUS CELL CARCINOMA. ?  ?09/15/2018 Cancer Staging  ? Staging form: Cervix Uteri, AJCC 7th Edition ?- Pathologic: FIGO Stage IB1 (T1b1, N0, cM0) - Sign

## 2021-11-07 LAB — CYTOLOGY - PAP
Comment: NEGATIVE
Diagnosis: NEGATIVE
Diagnosis: REACTIVE
High risk HPV: NEGATIVE

## 2021-11-08 ENCOUNTER — Telehealth: Payer: Self-pay | Admitting: *Deleted

## 2021-11-08 NOTE — Telephone Encounter (Signed)
Attempted to reach pt to review pap results with the assistance of interpreter line. Unable to reach pt. LVM for return call.  ?

## 2021-11-11 ENCOUNTER — Encounter (HOSPITAL_COMMUNITY): Payer: Self-pay

## 2021-11-11 ENCOUNTER — Ambulatory Visit (HOSPITAL_COMMUNITY)
Admission: RE | Admit: 2021-11-11 | Discharge: 2021-11-11 | Disposition: A | Discharge status: Home / Self Care | Payer: BLUE CROSS/BLUE SHIELD | Source: Ambulatory Visit | Attending: Gynecologic Oncology | Admitting: Gynecologic Oncology

## 2021-11-11 DIAGNOSIS — C539 Malignant neoplasm of cervix uteri, unspecified: Secondary | ICD-10-CM | POA: Insufficient documentation

## 2021-11-11 MED ORDER — SODIUM CHLORIDE (PF) 0.9 % IJ SOLN
INTRAMUSCULAR | Status: AC
  Filled 2021-11-11: qty 50

## 2021-11-11 MED ORDER — IOHEXOL 300 MG/ML  SOLN
100.0000 mL | Freq: Once | INTRAMUSCULAR | Status: AC | PRN
  Administered 2021-11-11: 100 mL via INTRAVENOUS

## 2021-11-11 NOTE — Telephone Encounter (Signed)
Reviewed pap results with pt with the assistant of an interpreter. Per Amy John, NP pap is negative for pre-cancer or cancer as well as HPV high risk. It shows some radiation changes. Pt verbalized understanding. She asked who will let her know the CT scan results. Informed her that we will call her when we get the results back. She verbalized understanding.  ?

## 2021-11-12 ENCOUNTER — Encounter: Payer: 59 | Admitting: Family Medicine

## 2021-11-13 NOTE — Progress Notes (Signed)
This encounter was created in error - please disregard.

## 2021-11-14 ENCOUNTER — Encounter: Payer: Self-pay | Admitting: Gynecologic Oncology

## 2021-11-14 ENCOUNTER — Inpatient Hospital Stay (HOSPITAL_BASED_OUTPATIENT_CLINIC_OR_DEPARTMENT_OTHER): Payer: BLUE CROSS/BLUE SHIELD | Admitting: Gynecologic Oncology

## 2021-11-14 DIAGNOSIS — M792 Neuralgia and neuritis, unspecified: Secondary | ICD-10-CM | POA: Diagnosis not present

## 2021-11-14 DIAGNOSIS — Z8541 Personal history of malignant neoplasm of cervix uteri: Secondary | ICD-10-CM | POA: Diagnosis not present

## 2021-11-14 NOTE — Progress Notes (Signed)
Gynecologic Oncology Telehealth Consult Note: Gyn-Onc ? ?I connected with Amy Morrow on 11/14/21 at  3:30 PM EDT by telephone and verified that I am speaking with the correct person using two identifiers. ? ?I discussed the limitations, risks, security and privacy concerns of performing an evaluation and management service by telemedicine and the availability of in-person appointments. I also discussed with the patient that there may be a patient responsible charge related to this service. The patient expressed understanding and agreed to proceed. ? ?Other persons participating in the visit and their role in the encounter: non. ? ?Patient's location: home  ?Provider's location: [facility] ? ?11/14/2021 ? ?Reason for Visit: Discussion of CT results ? 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  ?Recurrent cervical cancer ([place])  ?06/11/2011 Pathology Results  ? 1. Endocervix, curettage ?DETACHED FRAGMENTS OF SQUAMOUS MUCOSA WITH KOILOCYTIC ATYPIA, SEE COMMENT. ?2. Cervix, biopsy ?LOW GRADE SQUAMOUS INTRAEPITHELIAL LESION, CIN-I (MILD DYSPLASIA), SEE COMMENT. ?Microscopic Comment ?1. 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 ?2. The findings ?  ?01/09/2012 Pathology Results  ? LOW GRADE SQUAMOUS INTRAEPITHELIAL LESION: CIN-1/ HPV (LSIL). ?ENDOMETRIAL CELLS ARE PRESENT. ?  ?01/12/2018 Pathology Results  ? Cervix, biopsy ?- INVASIVE SQUAMOUS CELL CARCINOMA ?- SEE COMMENT ?  ?02/08/2018 Pathology Results  ? 1. Lymph nodes, regional resection, right pelvic ?- EIGHT LYMPH NODES, NEGATIVE FOR CARCINOMA (0/8). ?2. Lymph nodes, regional resection, left pelvic ?- TWELVE LYMPH NODES, NEGATIVE FOR CARCINOMA (0/12). ?3. Uterus +/- tubes/ovaries, neoplastic, cervix, bilateral tubes and ovaries, upper third of vagina ?- INVASIVE MODERATELY DIFFERENTIATED SQUAMOUS CELL CARCINOMA, 2.6 CM, INVOLVING ANTERIOR ?PORTION OF CERVIX FROM  9 O'CLOCK TO 3 O'CLOCK. ?- TUMOR INVADES FOR DEPTH OF 0.5 CM AND IS CONFINED TO THE CERVIX. ?- ALL RESECTION MARGINS ARE NEGATIVE FOR CARCINOMA; THE CLOSEST IS THE VAGINAL CUFF MARGIN AT 1 CM. ?- NEGATIVE FOR LYMPHOVASCULAR OR PERINEURAL INVASION. ?- BENIGN ENDOMETRIAL POLYP, 2.6 CM. ?- BENIGN LEIOMYOMA, 2.2 CM ?- BENIGN INACTIVE ENDOMETRIUM. ?- BENIGN BILATERAL OVARIES AND FALLOPIAN TUBES. ?- SEE ONCOLOGY TABLE. ?Microscopic Comment ?3. UTERINE CERVIX: Resection ?Procedure: Radial hysterectomy. ?Tumor Size: 2.6 cm. ?Histologic Type: Squamous cell carcinoma. ?Histologic Grade: G2: Moderately differentiated. ?Stromal Invasion: ?Depth of stromal invasion (millimeters): 5 mm. ?Horizontal extent longitudinal/length (if applicable#) (millimeters): 20 mm. ?Horizontal extent circumferential/width (if applicable#) (millimeters): 26 mm. ?Other Tissue/ Organ: Not involved. ?Margins: Negative for carcinoma. ?Lymphovascular Invasion: Not identified. ?Regional Lymph Nodes: All lymph nodes negative for tumor cells ?Total Number of Lymph Nodes Examined: 20. ?Number of Sentinel Nodes Examined (if applicable): 0. ?Pathologic Stage Classification (pTNM, AJCC 8th Edition): pT1b1, pN0. ?Ancillary Studies: Not applicable. ?Representative Tumor Block: 3E, 39F, and 3G. ?  ?02/08/2018 Surgery  ? Operation: Robotic-assisted type III radical laparoscopic hysterectomy with bilateral salpingoophorectomy and bilateral pelvic lymphadenectomy ?  ?Surgeon: Donaciano Eva ?   ?Operative Findings:  : 2-3cm pedunculated exophytic mass from right anterior cervix. No clinical involvement of the parametria, no suspicious nodes ?  ?02/16/2018 Surgery  ? Procedure: ?Cystoscopy, bilateral retrograde pyelograms with interpretation ?Diagnostic ureteroscopy ?Bilateral ureteral stent placement ?  ?Surgeon: Ardis Hughs, MD ?   ?Intraoperative findings:  ?#1: The retrograde pyelogram on the patient's right side initially demonstrated significant  contrast extravasation several centimeters from the ureteral orifice.  There was proximal hydroureteronephrosis. ?#2: Ureteroscopy of the right ureter demonstrated a full-thickness ureteral injury likely thermal in nature approximately 2-1/2 cm from the UVJ. ?#3: The  retrograde on the patient's left side demonstrated severe extravasation with no contrast getting beyond the first centimeter of the ureter.  Once beyond this area with a ureteroscope the ureter was mild to moderately dilated. ?4.:  Ureteroscopy of the left ureter demonstrated an area approximately 2 cm from the UVJ and lasting approximately a centimeter and a half of devitalized tissue, full-thickness, likely thermal in nature. ?#5: 5 French x22 cm Polaris stents were placed bilaterally. ?  ?  ?03/22/2018 Imaging  ? Highly suspicious nodule in the inferior right thyroid lobe ?corresponds with the hypermetabolic activity on the PET-CT. ?Recommend ultrasound-guided biopsy of this right thyroid nodule. ?  ?No other thyroid nodules. ?  ?04/22/2018 Surgery  ? Procedure: ?Cystoscopy ?Bilateral retrograde pyelogram with interpretation ?Bilateral ureteral stent exchange ?Left ureteroscopy, diagnostic ?  ?Surgeon: Ardis Hughs, MD ?   ?Intraoperative findings:  ?#1: On speculum exam there was a tine from the right ureteral stent (Polaris) noted at the vaginal cuff.  The remainder of the vaginal cuff was healed, including the area around the ureteral stent tine. ?2.:  The right retrograde pyelogram was performed using 10 cc of Omni, and demonstrated normal caliber ureter with no significant hydroureteronephrosis.  There did not appear to be any communication or extravasation of contrast in the distal ureter or UVJ with the vagina. ?3.:  The left retrograde pyelogram demonstrated a small area of extravasation at the UVJ, nearly the intramural ureter, that seemed to communicate with the patient's vagina.  There were no other significant filling defects, there  is no hydroureteronephrosis. ?4.:  Left diagnostic ureteroscopy demonstrated an abnormality within the intramural or very distal UVJ region where there was some bullous edema and likely the site of the fistula.  There was no identifiable communication.  The remaining aspect of the ureter was normal. ?  ?05/28/2018 Surgery  ? Procedure: ?Bilateral retrograde pyelogram with interpretation ?Bilateral diagnostic ureteroscopy ?Bilateral ureteral stent exchange ?Bilateral nephrostomy tube removal ?  ?Surgeon: Ardis Hughs, MD ?   ?Intraoperative findings:  ?#1: 5 [place] open-ended ureteral catheter was used to perform a retrograde pyelogram in the patient's left ureter which demonstrated normal caliber ureter with no hydro-.  However, there was a narrowed segment at the intramural ureter and just at the UVJ with a prominent fistula draining into the vagina. ?#2: I removed the wire to see the how well the ureter with drain, it did not drain well at all.  As such I tried to repassed the wire and ultimately had to pass a ureteroscope through the distal ureter in order to get the wire up into the left renal pelvis.  This noted distorted and abnormal mucosa in the intravesical and ureterovesical junction. ?#3: The patient's right sided ureteral stent distal had migrated into the patient's vagina.  In order to remove the stent I had to perform ureteroscopy on the right ureter cannulating the right ureteral orifice and advancing it through the intramural ureter and beyond the distal ureter.  Once I was up beyond the fistula I passed the wire through the scope and into the right renal pelvis.  Once the wire was in the pelvis I removed the scope and pulled the stent through the patient's vagina. ?#4: I then exchanged the 0.38 sensor wire in the right collecting system for a 5 [place] open-ended ureteral catheter performed retrograde pyelogram which demonstrated prominent fistula in the distal ureter/UVJ. ?#5: A 24 cm x 6  French double-J ureteral stent was placed in the right  ureter. ?#6: A 22 cm x 5 French Polaris catheter was placed in the patient's left ureter ?  ?07/02/2018 Surgery  ? Procedure: ?Robotic assisted laparosco

## 2021-11-27 ENCOUNTER — Telehealth: Payer: Self-pay | Admitting: *Deleted

## 2021-11-27 ENCOUNTER — Encounter: Payer: Self-pay | Admitting: Family Medicine

## 2021-11-27 ENCOUNTER — Ambulatory Visit (INDEPENDENT_AMBULATORY_CARE_PROVIDER_SITE_OTHER): Payer: BLUE CROSS/BLUE SHIELD | Admitting: Family Medicine

## 2021-11-27 VITALS — BP 118/66 | HR 97 | Temp 96.9°F | Ht <= 58 in | Wt 162.0 lb

## 2021-11-27 DIAGNOSIS — M792 Neuralgia and neuritis, unspecified: Secondary | ICD-10-CM | POA: Diagnosis not present

## 2021-11-27 DIAGNOSIS — Z8739 Personal history of other diseases of the musculoskeletal system and connective tissue: Secondary | ICD-10-CM | POA: Diagnosis not present

## 2021-11-27 MED ORDER — GABAPENTIN 100 MG PO CAPS: ORAL_CAPSULE | ORAL | 0 refills | Status: DC

## 2021-11-27 MED ORDER — GABAPENTIN 100 MG PO CAPS: 100.0000 mg | ORAL_CAPSULE | Freq: Three times a day (TID) | ORAL | 0 refills | Status: DC

## 2021-11-27 NOTE — Telephone Encounter (Signed)
[facility] called wanting to confirm the directions for patient's Gabapentin written for today.  ?Spoke with Dr. Sabra Heck and confirmed the directions of : ?Take 1 capsule at night for 3 days, then add 1 capsule in the morning. If after 2 weeks symptoms are no better call office for further instruction ? ?Medication list updated and pharmacy aware.  ?

## 2021-11-27 NOTE — Progress Notes (Signed)
? ? ?Provider:  ?Alain Honey, MD ? ?Careteam: ?Patient Care Team: ?Wardell Honour, MD as PCP - General (Family Medicine) ?[name], RN as Oncology Nurse Navigator (Oncology) ? ?PLACE OF SERVICE:  ?[facility]  ?Advanced Directive information ?  ? ?No Known Allergies ? ?Chief Complaint  ?Patient presents with  ? Medical Management of Chronic Issues  ?  Patient presents today for a 7 month follow-up.  ? Quality Metric Gaps  ?  Eye & foot exam, urine microalbumin, cologuard, mammogram, A1C, zoster, COVID   ? ? ? ?HPI: Patient is a 61 y.o. female this lady is here to follow-up.  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 ?Review of Systems:  ?Review of Systems  ?Respiratory: Negative.    ?Cardiovascular: Negative.   ?Neurological:  Positive for tingling.  ?     Burning and tingling are localized in a band around her waist.  She does also have some burning in her feet  ?All other systems reviewed and are negative. ? ?Past Medical History:  ?Diagnosis Date  ? Abnormal Pap smear   ? Anemia   ? Cervical cancer [facility]) oncologist- dr Denman George  ? Stage IB1  SCCa   ? Dyspnea   ? Fatigue   ? History of radiation therapy 11/01/2018  ? IMRT 09/27/2018-11/01/2018 to Pelvis, Vagina / 45 Gy in 25 fractions  Dr Gery Pray  ? History of radiation therapy 12/01/2018  ? Vaginal Cuff (area of recurrence) / 24 Gy in 4 fractions 11/10/2018-12/01/2018  Dr Gery Pray  ? Hyperlipidemia   ? Left breast mass 2013  ? 7x4x4 mm 6o'clock  ? PONV (postoperative nausea and vomiting)   ? Pre-diabetes   ? Sepsis ([place]) 03/2018  ? Urosepsis, Resolved  ? Thyroid nodule 03/25/2018  ? inferior right thyroid, noted on US thyroid  ? Ureterovaginal fistula   ? Urgency of  urination   ? intermittant  ? Urinary frequency   ? ?Past Surgical History:  ?Procedure Laterality Date  ? ABDOMINAL HYSTERECTOMY    ? BREAST BIOPSY Left 06/15/2012  ? CYSTOSCOPY W/ RETROGRADES Bilateral 02/16/2018  ? Procedure: CYSTOSCOPY WITH RETROGRADE PYELOGRAM BILATERAL DIAGNOSTIC URETEROSCOPY, BILATERAL  STENT PLACEMENT;  Surgeon: Ardis Hughs, MD;  Location: [facility];  Service: Urology;  Laterality: Bilateral;  ? CYSTOSCOPY W/ URETERAL STENT PLACEMENT Bilateral 04/22/2018  ? Procedure: CYSTOSCOPY WITH BILATERAL RETROGRADE PYELOGRAM/ AND URETERAL STENT EXCHANGE, vaginal exam;  Surgeon: Ardis Hughs, MD;  Location: [facility];  Service: Urology;  Laterality: Bilateral;  ? CYSTOSCOPY W/ URETERAL STENT PLACEMENT Bilateral 05/28/2018  ? Procedure: CYSTOSCOPY WITH BILATERAL  RETROGRADE PYELOGRAM/URETERAL STENT REPLACEMENT, REMOVAL OF BILATERAL PERCUTANEOUS DRAINS;  Surgeon: Ardis Hughs, MD;  Location: [facility];  Service: Urology;  Laterality: Bilateral;  ? D & C LEEP CONIZATION BX  07-31-2003   dr p. rose [facility]  ? IR IMAGING GUIDED PORT INSERTION  09/27/2018  ? IR NEPHROSTOGRAM RIGHT THRU EXISTING ACCESS  03/18/2018  ? IR NEPHROSTOMY EXCHANGE LEFT  04/27/2018  ? IR NEPHROSTOMY EXCHANGE RIGHT  04/27/2018  ? IR NEPHROSTOMY PLACEMENT LEFT  03/18/2018  ? IR NEPHROSTOMY PLACEMENT RIGHT  03/02/2018  ? IR REMOVAL TUN ACCESS W/ PORT W/O FL  MOD SED  03/17/2019  ? PELVIC LYMPH NODE DISSECTION Bilateral 02/08/2018  ? Procedure: BILATEAL PEVIC  LYMPHADENECTOMY;  Surgeon: [name], MD;  Location: [facility];  Service: Gynecology;  Laterality: Bilateral;  ? ROBOTIC ASSISTED TOTAL HYSTERECTOMY WITH BILATERAL SALPINGO OOPHERECTOMY N/A 02/08/2018  ? Procedure: XI ROBOTIC ASSISTED TOTAL Radical HYSTERECTOMY WITH BILATERAL SALPINGO OOPHORECTOMY;  Surgeon: [name], MD;  Location: [facility];  Service: Gynecology;  Laterality: N/A;  ? Transvaginal tape placement  03-12-2009  dr Emeterio Reeve  [facility]  ?  GYNECARE TENSION-FREE VAGINAL TAPE SLING  ? TUBAL LIGATION  05-28-2005  DR  MARSHALL  @ [place]  ? PPTL  ? ?Social History: ?  reports that she has never smoked. She has never used smokeless tobacco. She reports that she does not drink alcohol and does not use drugs. ? ?Family History  ?Problem Relation Age of Onset  ? Hypertension Mother   ? Heart disease Mother   ? Hypertension Brother   ? Heart disease Brother   ? ? ?Medications: ?Patient's Medications  ?New Prescriptions  ? No medications on file  ?Previous Medications  ? CALCITRIOL (ROCALTROL) 0.25 MCG CAPSULE    Take 0.25 mcg by mouth 2 (two) times daily.  ? CALCIUM-VITAMIN D (OSCAL WITH D) 500-200 MG-UNIT TABS TABLET    Take by mouth.  ? EUTHYROX 112 MCG TABLET    Take 112 mcg by mouth daily.  ?Modified Medications  ? No medications on file  ?Discontinued Medications  ? CARBAMIDE PEROXIDE (DEBROX) 6.5 % OTIC SOLUTION    Place 5 drops into the right ear 2 (two) times daily.  ? ? ?Physical Exam: ? ?Vitals:  ? 11/27/21 1049  ?BP: 118/66  ?Pulse: 97  ?Temp: (!) 96.9 ?F (36.1 ?C)  ?SpO2: 98%  ?Weight: 162 lb (73.5 kg)  ?Height: '4\' 9"'$  (1.448 m)  ? ?Body mass index is 35.06 kg/m?. ?Wt Readings from Last 3 Encounters:  ?11/27/21 162 lb (73.5 kg)  ?11/04/21 160 lb 3.2 oz (72.7 kg)  ?10/23/21 160 lb 6.4 oz (72.8 kg)  ? ? ?Physical Exam ?Vitals and nursing note reviewed.  ?Constitutional:   ?   Appearance: She is obese.  ?Cardiovascular:  ?   Rate and Rhythm: Normal rate.  ?Pulmonary:  ?   Effort: Pulmonary effort is normal.  ?Neurological:  ?   General: No focal deficit present.  ?   Mental Status: She is alert and oriented to person, place, and time.  ?   Comments: Foot exam for sensation, vibration, pulses are normal  ? ? ?Labs reviewed: ?Basic Metabolic Panel: ?Recent Labs  ?  04/17/21 ?1207 11/04/21 ?4166  ?NA 140 141  ?K 3.7 3.6  ?CL 102 105  ?CO2 30 27  ?GLUCOSE 141* 101*  ?BUN 15 23*  ?CREATININE 0.68 0.66  ?CALCIUM 8.5* 8.1*  ?TSH 0.33*  --   ? ?Liver Function  Tests: ?Recent Labs  ?  04/17/21 ?1207  ?AST 16  ?ALT 13  ?BILITOT 0.6  ?PROT 7.4  ? ?No results for input(s): LIPASE, AMYLASE in the last 8760 hours. ?No results for input(s): AMMONIA in the last 8760 hours. ?CBC: ?No results for input(s): WBC, NEUTROABS, HGB, HCT, MCV, PLT in the last 8760 hours. ?Lipid Panel: ?Recent Labs  ?  04/17/21 ?1207  ?CHOL 181  ?HDL 51  ?LDLCALC 100*  ?TRIG 179*  ?CHOLHDL 3.5  ? ?TSH: ?Recent Labs  ?  04/17/21 ?1207  ?TSH 0.33*  ? ?A1C: ?Lab Results  ?Component Value Date  ?  HGBA1C 5.9 (H) 04/17/2021  ? ? ? ?Assessment/Plan ? ?1. Neuropathic pain ?Agree with obstetric oncology consultant that her pain likely is related to neuropathy. ?We will begin treatment with gabapentin 100 mg once a day, then twice a day and titrate upward according to symptom relief ? ? ?Alain Honey, MD ?[facility] ?[phone]  ? ?

## 2021-12-19 ENCOUNTER — Telehealth: Payer: Self-pay | Admitting: *Deleted

## 2021-12-19 NOTE — Telephone Encounter (Signed)
Called Almyra Free the interp. and had her call this patient and inform her of the fu with Dr. Berline Lopes on 02-03-22 - arrival time -2:30 pm, lvm for a return call (on Julie's VM)

## 2022-01-06 ENCOUNTER — Telehealth: Payer: Self-pay | Admitting: *Deleted

## 2022-01-06 NOTE — Telephone Encounter (Signed)
[facility] called and scheduled the patient a new patient appt for 6/19 at 945 am

## 2022-01-30 ENCOUNTER — Telehealth: Payer: Self-pay

## 2022-01-30 NOTE — Telephone Encounter (Signed)
Unable to reach pt, via [employer] [phone]. Meaningful Use needs to be updated for upcoming appointment on 02/03/22 with Dr. Berline Lopes

## 2022-02-03 ENCOUNTER — Telehealth: Payer: Self-pay

## 2022-02-03 ENCOUNTER — Inpatient Hospital Stay: Payer: BLUE CROSS/BLUE SHIELD | Admitting: Gynecologic Oncology

## 2022-02-03 ENCOUNTER — Other Ambulatory Visit: Payer: Self-pay

## 2022-02-03 DIAGNOSIS — C539 Malignant neoplasm of cervix uteri, unspecified: Secondary | ICD-10-CM

## 2022-02-03 NOTE — Telephone Encounter (Signed)
Spoke with Amy Morrow when she arrived to her appointment. Explained to patient that she is not due for an appointment today. She is next due in October with Dr. Sondra Come and she will see Dr. Berline Lopes 6 months after that. Offered patient appointment today if she was having symptoms and wished to be seen. Patient declined appointment today. Provided patient with appointment details for October. Instructed patient to call with any needs.

## 2022-02-03 NOTE — Telephone Encounter (Signed)
Attempted to contact patient with assistance of pacific interpreter Beola Cord [phone]).  Regarding today's appointment. Unable to contact patient, left message requesting return call.  Patient is not due for an appointment today. She is due to see Dr. Sondra Come in October and will see Dr. Berline Lopes 6 months after that.

## 2022-02-26 ENCOUNTER — Ambulatory Visit: Payer: BLUE CROSS/BLUE SHIELD | Admitting: Family Medicine

## 2022-03-12 ENCOUNTER — Encounter: Payer: Self-pay | Admitting: Family Medicine

## 2022-03-12 ENCOUNTER — Ambulatory Visit (INDEPENDENT_AMBULATORY_CARE_PROVIDER_SITE_OTHER): Payer: BLUE CROSS/BLUE SHIELD | Admitting: Family Medicine

## 2022-03-12 VITALS — BP 118/82 | HR 112 | Temp 96.8°F | Ht <= 58 in | Wt 153.6 lb

## 2022-03-12 DIAGNOSIS — R5381 Other malaise: Secondary | ICD-10-CM | POA: Diagnosis not present

## 2022-03-12 DIAGNOSIS — M2550 Pain in unspecified joint: Secondary | ICD-10-CM

## 2022-03-12 DIAGNOSIS — E118 Type 2 diabetes mellitus with unspecified complications: Secondary | ICD-10-CM | POA: Diagnosis not present

## 2022-03-12 DIAGNOSIS — R5383 Other fatigue: Secondary | ICD-10-CM | POA: Diagnosis not present

## 2022-03-12 NOTE — Progress Notes (Signed)
Provider:  Alain Honey, MD  Careteam: Patient Care Team: Wardell Honour, MD as PCP - General (Family Medicine)  PLACE OF SERVICE:  [facility] Directive information    No Known Allergies  Chief Complaint  Patient presents with   Medical Management of Chronic Issues    Patient presents today for a 3 month follow-up.   Quality Metric Gaps    Foot & eye exam, cologuard, mammogram, urine microalbumin, A1C, zoster     HPI: Patient is a 61 y.o. female patient is seen in follow-up for management of chronic diseases including prediabetes and cancer of cervix and thyroid.  Today with the help of interpreter we explored her symptom of malaise and fatigue.  She states that she wakes up feeling tired.  She does endorse increased stress as she takes care of her grandchildren on a daily basis.  She sleeps okay. She is unsure when her thyroid was last checked. Review of Systems:  Review of Systems  Constitutional:  Positive for malaise/fatigue.  Respiratory: Negative.    Cardiovascular: Negative.   Gastrointestinal: Negative.   Genitourinary: Negative.   Psychiatric/Behavioral:  Positive for depression. The patient is nervous/anxious.   All other systems reviewed and are negative.   Past Medical History:  Diagnosis Date   Abnormal Pap smear    Anemia    Cervical cancer [facility]) oncologist- dr Denman George   Stage IB1  SCCa    Dyspnea    Fatigue    History of radiation therapy 11/01/2018   IMRT 09/27/2018-11/01/2018 to Pelvis, Vagina / 45 Gy in 25 fractions  Dr Gery Pray   History of radiation therapy 12/01/2018   Vaginal Cuff (area of recurrence) / 24 Gy in 4 fractions 11/10/2018-12/01/2018  Dr Gery Pray   Hyperlipidemia    Left breast mass 2013   7x4x4 mm 6o'clock   PONV (postoperative nausea and vomiting)    Pre-diabetes    Sepsis ([place]) 03/2018   Urosepsis, Resolved   Thyroid nodule 03/25/2018   inferior right thyroid, noted on US thyroid   Ureterovaginal  fistula    Urgency of urination    intermittant   Urinary frequency    Past Surgical History:  Procedure Laterality Date   ABDOMINAL HYSTERECTOMY     BREAST BIOPSY Left 06/15/2012   CYSTOSCOPY W/ RETROGRADES Bilateral 02/16/2018   Procedure: CYSTOSCOPY WITH RETROGRADE PYELOGRAM BILATERAL DIAGNOSTIC URETEROSCOPY, BILATERAL  STENT PLACEMENT;  Surgeon: Ardis Hughs, MD;  Location: [facility];  Service: Urology;  Laterality: Bilateral;   CYSTOSCOPY W/ URETERAL STENT PLACEMENT Bilateral 04/22/2018   Procedure: CYSTOSCOPY WITH BILATERAL RETROGRADE PYELOGRAM/ AND URETERAL STENT EXCHANGE, vaginal exam;  Surgeon: Ardis Hughs, MD;  Location[facility];  Service: Urology;  Laterality: Bilateral;   CYSTOSCOPY W/ URETERAL STENT PLACEMENT Bilateral 05/28/2018   Procedure: CYSTOSCOPY WITH BILATERAL  RETROGRADE PYELOGRAM/URETERAL STENT REPLACEMENT, REMOVAL OF BILATERAL PERCUTANEOUS DRAINS;  Surgeon: Ardis Hughs, MD;  Location: [facility];  Service: Urology;  Laterality: Bilateral;   D & C LEEP CONIZATION BX  07-31-2003   dr p. rose [place]   IR IMAGING GUIDED PORT INSERTION  09/27/2018   IR NEPHROSTOGRAM RIGHT THRU EXISTING ACCESS  03/18/2018   IR NEPHROSTOMY EXCHANGE LEFT  04/27/2018   IR NEPHROSTOMY EXCHANGE RIGHT  04/27/2018   IR NEPHROSTOMY PLACEMENT LEFT  03/18/2018   IR NEPHROSTOMY PLACEMENT RIGHT  03/02/2018   IR REMOVAL TUN ACCESS W/ PORT W/O FL MOD SED  03/17/2019   PELVIC  LYMPH NODE DISSECTION Bilateral 02/08/2018   Procedure: BILATEAL PEVIC  LYMPHADENECTOMY;  Surgeon: [name], MD;  Location: [facility];  Service: Gynecology;  Laterality: Bilateral;   ROBOTIC ASSISTED TOTAL HYSTERECTOMY WITH BILATERAL SALPINGO OOPHERECTOMY N/A 02/08/2018   Procedure: XI ROBOTIC ASSISTED TOTAL Radical HYSTERECTOMY WITH BILATERAL SALPINGO OOPHORECTOMY;  Surgeon: [name], MD;  Location: [facility];  Service: Gynecology;  Laterality: N/A;   Transvaginal tape placement  03-12-2009   dr Emeterio Reeve  [facility]   GYNECARE TENSION-FREE VAGINAL TAPE SLING   TUBAL LIGATION  05-28-2005  DR  MARSHALL  @ [facility]   PPTL   Social History:   reports that she has never smoked. She has never used smokeless tobacco. She reports that she does not drink alcohol and does not use drugs.  Family History  Problem Relation Age of Onset   Hypertension Mother    Heart disease Mother    Hypertension Brother    Heart disease Brother     Medications: Patient's Medications  New Prescriptions   No medications on file  Previous Medications   CALCITRIOL (ROCALTROL) 0.25 MCG CAPSULE    Take 0.25 mcg by mouth 2 (two) times daily.   CALCIUM-VITAMIN D (OSCAL WITH D) 500-200 MG-UNIT TABS TABLET    Take by mouth.   EUTHYROX 112 MCG TABLET    Take 112 mcg by mouth daily.   GABAPENTIN (NEURONTIN) 100 MG CAPSULE    Take 1 capsule at night for 3 days, then add 1 capsule in the morning.  If after 2 weeks symptoms are no better call office for further instruction  Modified Medications   No medications on file  Discontinued Medications   No medications on file    Physical Exam:  Vitals:   03/12/22 0921  BP: 118/82  Pulse: (!) 112  Temp: (!) 96.8 F (36 C)  SpO2: 97%  Weight: 153 lb 9.6 oz (69.7 kg)  Height: '4\' 9"'$  (1.448 m)   Body mass index is 33.24 kg/m. Wt Readings from Last 3 Encounters:  03/12/22 153 lb 9.6 oz (69.7 kg)  11/27/21 162 lb (73.5 kg)  11/04/21 160 lb 3.2 oz (72.7 kg)    Physical Exam Vitals and nursing note reviewed.  Constitutional:      Appearance: Normal appearance.  Cardiovascular:     Rate and Rhythm: Normal rate.     Pulses: Normal pulses.     Heart sounds: Normal heart sounds.  Pulmonary:     Effort: Pulmonary effort is normal.     Breath sounds: Normal breath sounds.  Abdominal:     General: Abdomen is flat.     Palpations: Abdomen is soft. There is no mass.     Tenderness: There is no abdominal tenderness. There is no guarding.  Neurological:     General:  No focal deficit present.     Mental Status: She is alert and oriented to person, place, and time.     Labs reviewed: Basic Metabolic Panel: Recent Labs    04/17/21 1207 11/04/21 1404  NA 140 141  K 3.7 3.6  CL 102 105  CO2 30 27  GLUCOSE 141* 101*  BUN 15 23*  CREATININE 0.68 0.66  CALCIUM 8.5* 8.1*  TSH 0.33*  --    Liver Function Tests: Recent Labs    04/17/21 1207  AST 16  ALT 13  BILITOT 0.6  PROT 7.4   No results for input(s): "LIPASE", "AMYLASE" in the last 8760 hours. No results for input(s): "AMMONIA" in  the last 8760 hours. CBC: No results for input(s): "WBC", "NEUTROABS", "HGB", "HCT", "MCV", "PLT" in the last 8760 hours. Lipid Panel: Recent Labs    04/17/21 1207  CHOL 181  HDL 51  LDLCALC 100*  TRIG 179*  CHOLHDL 3.5   TSH: Recent Labs    04/17/21 1207  TSH 0.33*   A1C: Lab Results  Component Value Date   HGBA1C 5.9 (H) 04/17/2021     Assessment/Plan  1. Controlled type 2 diabetes mellitus with complication, without long-term current use of insulin ([place]) Patient is no longer taking metformin.  Previously she had been classified as prediabetes but given her tiredness and increased thirst we will check A1c to see if there has been any progression of her  2. Malaise and fatigue I suspect her symptoms are related to stress but will try to rule out other correctable causes of symptoms such as anemia hypokalemia diabetes with some lab work today  3. Arthralgia, unspecified joint Joint symptoms are universal.  She does have some nodules on her fingers suggestive of possible rheumatoid disease.  I have suggested she try ibuprofen rather than Tylenol for symptomatic relief   Alain Honey, MD [place] [phone]

## 2022-03-12 NOTE — Patient Instructions (Signed)
May take ibuprofen for joint pains: 400-600 mg tid

## 2022-03-13 LAB — COMPLETE METABOLIC PANEL WITH GFR
AG Ratio: 1.3 (calc) (ref 1.0–2.5)
ALT: 13 U/L (ref 6–29)
AST: 14 U/L (ref 10–35)
Albumin: 4.4 g/dL (ref 3.6–5.1)
Alkaline phosphatase (APISO): 79 U/L (ref 37–153)
BUN: 15 mg/dL (ref 7–25)
CO2: 28 mmol/L (ref 20–32)
Calcium: 8.1 mg/dL — ABNORMAL LOW (ref 8.6–10.4)
Chloride: 100 mmol/L (ref 98–110)
Creat: 0.92 mg/dL (ref 0.50–1.03)
Globulin: 3.5 g/dL (calc) (ref 1.9–3.7)
Glucose, Bld: 134 mg/dL (ref 65–139)
Potassium: 3.7 mmol/L (ref 3.5–5.3)
Sodium: 139 mmol/L (ref 135–146)
Total Bilirubin: 0.9 mg/dL (ref 0.2–1.2)
Total Protein: 7.9 g/dL (ref 6.1–8.1)
eGFR: 72 mL/min/{1.73_m2} (ref >=60)

## 2022-03-13 LAB — CBC
HCT: 34.4 % — ABNORMAL LOW (ref 35.0–45.0)
Hemoglobin: 11.6 g/dL — ABNORMAL LOW (ref 11.7–15.5)
MCH: 30.7 pg (ref 27.0–33.0)
MCHC: 33.7 g/dL (ref 32.0–36.0)
MCV: 91 fL (ref 80.0–100.0)
MPV: 8.9 fL (ref 7.5–12.5)
Platelets: 300 10*3/uL (ref 140–400)
RBC: 3.78 10*6/uL — ABNORMAL LOW (ref 3.80–5.10)
RDW: 13.4 % (ref 11.0–15.0)
WBC: 8.5 10*3/uL (ref 3.8–10.8)

## 2022-03-13 LAB — HEMOGLOBIN A1C
Hgb A1c MFr Bld: 6.3 % of total Hgb — ABNORMAL HIGH (ref <5.7)
Mean Plasma Glucose: 134 mg/dL
eAG (mmol/L): 7.4 mmol/L

## 2022-03-13 LAB — SEDIMENTATION RATE: Sed Rate: 84 mm/h — ABNORMAL HIGH (ref 0–30)

## 2022-03-15 ENCOUNTER — Other Ambulatory Visit: Payer: Self-pay | Admitting: Family Medicine

## 2022-03-15 DIAGNOSIS — M2550 Pain in unspecified joint: Secondary | ICD-10-CM

## 2022-03-20 ENCOUNTER — Other Ambulatory Visit: Payer: Self-pay

## 2022-03-20 ENCOUNTER — Other Ambulatory Visit: Payer: BLUE CROSS/BLUE SHIELD

## 2022-03-20 DIAGNOSIS — M2550 Pain in unspecified joint: Secondary | ICD-10-CM

## 2022-03-26 LAB — RHEUMATOID ARTHRITIS DIAGNOSTIC PANEL
Cyclic Citrullin Peptide Ab: <16 UNITS
Rheumatoid fact SerPl-aCnc: <14 IU/mL (ref <14)

## 2022-04-03 ENCOUNTER — Encounter: Payer: Self-pay | Admitting: Adult Health

## 2022-04-03 ENCOUNTER — Ambulatory Visit: Payer: BLUE CROSS/BLUE SHIELD | Admitting: Adult Health

## 2022-04-03 VITALS — BP 120/80 | HR 124 | Temp 99.4°F | Resp 16 | Ht <= 58 in | Wt 148.4 lb

## 2022-04-03 DIAGNOSIS — R509 Fever, unspecified: Secondary | ICD-10-CM

## 2022-04-03 DIAGNOSIS — R8290 Unspecified abnormal findings in urine: Secondary | ICD-10-CM

## 2022-04-03 DIAGNOSIS — Z0000 Encounter for general adult medical examination without abnormal findings: Secondary | ICD-10-CM

## 2022-04-03 DIAGNOSIS — R630 Anorexia: Secondary | ICD-10-CM | POA: Diagnosis not present

## 2022-04-03 DIAGNOSIS — G620 Drug-induced polyneuropathy: Secondary | ICD-10-CM

## 2022-04-03 DIAGNOSIS — R5383 Other fatigue: Secondary | ICD-10-CM | POA: Diagnosis not present

## 2022-04-03 DIAGNOSIS — T451X5A Adverse effect of antineoplastic and immunosuppressive drugs, initial encounter: Secondary | ICD-10-CM

## 2022-04-03 DIAGNOSIS — R000 Tachycardia, unspecified: Secondary | ICD-10-CM

## 2022-04-03 DIAGNOSIS — R519 Headache, unspecified: Secondary | ICD-10-CM | POA: Diagnosis not present

## 2022-04-03 DIAGNOSIS — E039 Hypothyroidism, unspecified: Secondary | ICD-10-CM

## 2022-04-03 DIAGNOSIS — E1140 Type 2 diabetes mellitus with diabetic neuropathy, unspecified: Secondary | ICD-10-CM

## 2022-04-03 DIAGNOSIS — R058 Other specified cough: Secondary | ICD-10-CM

## 2022-04-03 LAB — POCT URINALYSIS DIPSTICK (MANUAL)
Nitrite, UA: POSITIVE — AB
Poct Bilirubin: NEGATIVE
Poct Blood: 250 — AB
Poct Glucose: NORMAL mg/dL
Poct Ketones: NEGATIVE
Poct Urobilinogen: NORMAL mg/dL
Spec Grav, UA: 1.025 (ref 1.010–1.025)
pH, UA: 6 (ref 5.0–8.0)

## 2022-04-03 MED ORDER — METOPROLOL TARTRATE 25 MG PO TABS: 12.5000 mg | ORAL_TABLET | Freq: Two times a day (BID) | ORAL | 0 refills | Status: DC | PRN

## 2022-04-03 MED ORDER — SULFAMETHOXAZOLE-TRIMETHOPRIM 800-160 MG PO TABS: 1.0000 | ORAL_TABLET | Freq: Two times a day (BID) | ORAL | 0 refills | Status: AC

## 2022-04-03 NOTE — Patient Instructions (Signed)

## 2022-04-03 NOTE — Progress Notes (Signed)
[facility]  Provider:   Durenda Age DNP  Code Status:  Full Code  Goals of Care:     04/03/2022   11:00 AM  Advanced Directives  Does Patient Have a Medical Advance Directive? No  Would patient like information on creating a medical advance directive? No - Patient declined     Chief Complaint  Patient presents with   Acute Visit    Patient complains of fatigue, fever, headache, loss of appetite, and dry cough. Symptoms started last week on Thursday 03/27/2022. Patient did home covid test and it was negative.    HPI: Patient is a 61 y.o. female seen today for an acute visit for regarding dry cough, fever, headache, fatigue and loss of appetite. She started feeling fatigue 2 weeks ago. She said that she has night sweats and feels feverish. Noted that her HR 124. She denies feeling the palpitations. She takes Euthyrox 112 mcg daily for hypothyroidism. Last tsh 2.24, within normal, done 01/02/22. She has noted a bad odor on her urine. She denies hematuria.  She complained of having numbness on bilateral hands. She said that she is not taking her gabapentin. Discussed with her that Gabapentin is for her numbness/neuropathy.    Past Medical History:  Diagnosis Date   Abnormal Pap smear    Anemia    Cervical cancer [facility]) oncologist- dr Denman George   Stage IB1  SCCa    Dyspnea    Fatigue    History of radiation therapy 11/01/2018   IMRT 09/27/2018-11/01/2018 to Pelvis, Vagina / 45 Gy in 25 fractions  Dr Gery Pray   History of radiation therapy 12/01/2018   Vaginal Cuff (area of recurrence) / 24 Gy in 4 fractions 11/10/2018-12/01/2018  Dr Gery Pray   Hyperlipidemia    Left breast mass 2013   7x4x4 mm 6o'clock   PONV (postoperative nausea and vomiting)    Pre-diabetes    Sepsis ([place]) 03/2018   Urosepsis, Resolved   Thyroid nodule 03/25/2018   inferior right thyroid, noted on US thyroid   Ureterovaginal fistula    Urgency of urination    intermittant   Urinary frequency      Past Surgical History:  Procedure Laterality Date   ABDOMINAL HYSTERECTOMY     BREAST BIOPSY Left 06/15/2012   CYSTOSCOPY W/ RETROGRADES Bilateral 02/16/2018   Procedure: CYSTOSCOPY WITH RETROGRADE PYELOGRAM BILATERAL DIAGNOSTIC URETEROSCOPY, BILATERAL  STENT PLACEMENT;  Surgeon: Ardis Hughs, MD;  [facility];  Service: Urology;  Laterality: Bilateral;   CYSTOSCOPY W/ URETERAL STENT PLACEMENT Bilateral 04/22/2018   Procedure: CYSTOSCOPY WITH BILATERAL RETROGRADE PYELOGRAM/ AND URETERAL STENT EXCHANGE, vaginal exam;  Surgeon: Ardis Hughs, MD;  Location: [facility];  Service: Urology;  Laterality: Bilateral;   CYSTOSCOPY W/ URETERAL STENT PLACEMENT Bilateral 05/28/2018   Procedure: CYSTOSCOPY WITH BILATERAL  RETROGRADE PYELOGRAM/URETERAL STENT REPLACEMENT, REMOVAL OF BILATERAL PERCUTANEOUS DRAINS;  Surgeon: Ardis Hughs, MD;  Location: [facility];  Service: Urology;  Laterality: Bilateral;   D & C LEEP CONIZATION BX  07-31-2003   dr p. rose [place]   IR IMAGING GUIDED PORT INSERTION  09/27/2018   IR NEPHROSTOGRAM RIGHT THRU EXISTING ACCESS  03/18/2018   IR NEPHROSTOMY EXCHANGE LEFT  04/27/2018   IR NEPHROSTOMY EXCHANGE RIGHT  04/27/2018   IR NEPHROSTOMY PLACEMENT LEFT  03/18/2018   IR NEPHROSTOMY PLACEMENT RIGHT  03/02/2018   IR REMOVAL TUN ACCESS W/ PORT W/O FL MOD SED  03/17/2019   PELVIC LYMPH NODE DISSECTION  Bilateral 02/08/2018   Procedure: BILATEAL PEVIC  LYMPHADENECTOMY;  Surgeon: [name], MD;  [facility];  Service: Gynecology;  Laterality: Bilateral;   ROBOTIC ASSISTED TOTAL HYSTERECTOMY WITH BILATERAL SALPINGO OOPHERECTOMY N/A 02/08/2018   Procedure: XI ROBOTIC ASSISTED TOTAL Radical HYSTERECTOMY WITH BILATERAL SALPINGO OOPHORECTOMY;  Surgeon: [name], MD;  Location[facility];  Service: Gynecology;  Laterality: N/A;   Transvaginal tape placement  03-12-2009  dr Emeterio Reeve  [facility]   GYNECARE TENSION-FREE VAGINAL TAPE SLING    TUBAL LIGATION  05-28-2005  DR  MARSHALL  @ [facility]   PPTL    No Known Allergies  Outpatient Encounter Medications as of 04/03/2022  Medication Sig   calcitRIOL (ROCALTROL) 0.25 MCG capsule Take 0.25 mcg by mouth 2 (two) times daily.   calcium-vitamin D (OSCAL WITH D) 500-200 MG-UNIT TABS tablet Take by mouth.   EUTHYROX 112 MCG tablet Take 112 mcg by mouth daily.   gabapentin (NEURONTIN) 100 MG capsule Take 1 capsule at night for 3 days, then add 1 capsule in the morning.  If after 2 weeks symptoms are no better call office for further instruction   No facility-administered encounter medications on file as of 04/03/2022.    Review of Systems:  Review of Systems  Constitutional:  Positive for appetite change, fatigue and fever. Negative for chills.       Poor appetite  HENT:  Negative for congestion, hearing loss, rhinorrhea and sore throat.   Eyes: Negative.   Respiratory:  Positive for cough. Negative for shortness of breath and wheezing.        Dry cough.  Cardiovascular:  Negative for chest pain, palpitations and leg swelling.  Gastrointestinal:  Negative for abdominal pain, constipation, diarrhea, nausea and vomiting.  Genitourinary:  Negative for dysuria.       "Urine smells bad"  Musculoskeletal:  Negative for arthralgias, back pain and myalgias.  Skin:  Negative for color change, rash and wound.  Neurological:  Positive for numbness. Negative for dizziness, weakness and headaches.       Occasional numbness  Psychiatric/Behavioral:  Negative for behavioral problems. The patient is not nervous/anxious.     Health Maintenance  Topic Date Due   COVID-19 Vaccine (1) Never done   FOOT EXAM  Never done   OPHTHALMOLOGY EXAM  Never done   URINE MICROALBUMIN  Never done   Zoster Vaccines- Shingrix (1 of 2) Never done   MAMMOGRAM  12/23/2019   Fecal DNA (Cologuard)  02/02/2021   INFLUENZA VACCINE  03/04/2022   HEMOGLOBIN A1C  09/12/2022   PAP SMEAR-Modifier  11/04/2024    TETANUS/TDAP  02/24/2028   Hepatitis C Screening  Completed   HIV Screening  Completed   HPV VACCINES  Aged Out    Physical Exam: Vitals:   04/03/22 1054  BP: 120/80  Pulse: (!) 124  Resp: 16  Temp: 98.2 F (36.8 C)  SpO2: 96%  Weight: 148 lb 6.4 oz (67.3 kg)  Height: _0  (1.448 m)   Body mass index is 32.11 kg/m. Physical Exam Constitutional:      Appearance: She is obese.  HENT:     Head: Normocephalic and atraumatic.     Nose: Nose normal.     Mouth/Throat:     Mouth: Mucous membranes are moist.  Eyes:     Conjunctiva/sclera: Conjunctivae normal.  Cardiovascular:     Rate and Rhythm: Regular rhythm. Tachycardia present.  Pulmonary:     Effort: Pulmonary effort is normal.  Breath sounds: Normal breath sounds.  Abdominal:     General: Bowel sounds are normal.     Palpations: Abdomen is soft.  Musculoskeletal:        General: Normal range of motion.     Cervical back: Normal range of motion.  Skin:    General: Skin is warm and dry.  Neurological:     General: No focal deficit present.     Mental Status: She is alert and oriented to person, place, and time.  Psychiatric:        Mood and Affect: Mood normal.        Behavior: Behavior normal.        Thought Content: Thought content normal.        Judgment: Judgment normal.     Labs reviewed: Basic Metabolic Panel: Recent Labs    04/17/21 1207 11/04/21 1404 03/12/22 1006  NA 140 141 139  K 3.7 3.6 3.7  CL 102 105 100  CO2 _0 GLUCOSE 141* 101* 134  BUN 15 23* 15  CREATININE 0.68 0.66 0.92  CALCIUM 8.5* 8.1* 8.1*  TSH 0.33*  --   --    Liver Function Tests: Recent Labs    04/17/21 1207 03/12/22 1006  AST 16 14  ALT 13 13  BILITOT 0.6 0.9  PROT 7.4 7.9   No results for input(s): "LIPASE", "AMYLASE" in the last 8760 hours. No results for input(s): "AMMONIA" in the last 8760 hours. CBC: Recent Labs    03/12/22 1006  WBC 8.5  HGB 11.6*  HCT 34.4*  MCV 91.0  PLT 300   Lipid  Panel: Recent Labs    04/17/21 1207  CHOL 181  HDL 51  LDLCALC 100*  TRIG 179*  CHOLHDL 3.5   Lab Results  Component Value Date   HGBA1C 6.3 (H) 03/12/2022    Procedures since last visit: No results found.  Assessment/Plan  1. Dry cough  Fatigue, unspecified type Nonintractable headache, unspecified chronicity pattern, unspecified headache type Loss of appetite Fever, unspecified fever cause - SARS-COV-2 RNA,(COVID-19) QUAL NAAT  2. Peripheral neuropathy due to chemotherapy ([place]) -  continue Gabapentin 100 mg Q HS  3. Type 2 diabetes mellitus with diabetic neuropathy, without long-term current use of insulin ([facility]) Lab Results  Component Value Date   HGBA1C 6.3 (H) 03/12/2022   -  diet-controlled  4. Tachycardia -  will start on Metoprolol PRN for HR >100 - EKG 12-Lead showed sinus Tachycardia with HR 111 - BMP with eGFR([facility]) - metoprolol tartrate (LOPRESSOR) 25 MG tablet; Take 0.5 tablets (12.5 mg total) by mouth 2 (two) times daily as needed for HR >100 Dispense: 60 tablet; Refill: 0  5. Bad odor of urine -  urine dipstick showed +nitrite, moderate leukocyte, +++protein, large blood, amber and cloudy appearance -  will start on Bactrim DS 1 tab PO BID X 7 days - Culture, Urine - CBC with Differential/Platelets  6. Hypothyroidism, unspecified type -  continue Euthyrox - TSH    Labs/tests ordered:  CBC, BMP, tsh, urine dipstick, urine culture, EKG and SARS-COV-2 RNA,(COVID-19) QUAL NAAT   Next appt:  in 2 weeks

## 2022-04-04 ENCOUNTER — Other Ambulatory Visit: Payer: Self-pay

## 2022-04-04 ENCOUNTER — Other Ambulatory Visit: Payer: Self-pay | Admitting: Adult Health

## 2022-04-04 DIAGNOSIS — R000 Tachycardia, unspecified: Secondary | ICD-10-CM

## 2022-04-04 DIAGNOSIS — E1140 Type 2 diabetes mellitus with diabetic neuropathy, unspecified: Secondary | ICD-10-CM

## 2022-04-04 DIAGNOSIS — E039 Hypothyroidism, unspecified: Secondary | ICD-10-CM

## 2022-04-04 LAB — SARS-COV-2 RNA,(COVID-19) QUALITATIVE NAAT: SARS CoV2 RNA: NOT DETECTED

## 2022-04-04 MED ORDER — CALCITRIOL 0.25 MCG PO CAPS
0.2500 ug | ORAL_CAPSULE | Freq: Three times a day (TID) | ORAL | 3 refills | Status: DC
Start: 2022-04-04 — End: 2024-05-26

## 2022-04-04 MED ORDER — LEVOTHYROXINE SODIUM 100 MCG PO TABS: 100.0000 ug | ORAL_TABLET | Freq: Every day | ORAL | 3 refills | Status: AC

## 2022-04-04 NOTE — Progress Notes (Signed)
Tsh is low, will need to decrease dosage of Euthyrox to 100 mcg daily and repeat tsh in 6 weeks. Elevated tsh might be the cause of palpitations. Stop taking the Euthyrox 112 mcg. -  Calcium level is low at 7.2, down from 8.1 (3 weeks ago). Will need to increase Calcitriol 0.25 mcg from BID to TID, will need to re-check in 6 weeks. -  hgb 10.8, dropped from 11.6 (done 3 weeks ago), ordered additional labs to find out what kind of anemia she has. Will call for results.

## 2022-04-05 LAB — CBC WITH DIFFERENTIAL/PLATELET
Absolute Monocytes: 621 cells/uL (ref 200–950)
Basophils Absolute: 34 cells/uL (ref 0–200)
Basophils Relative: 0.4 %
Eosinophils Absolute: 17 cells/uL (ref 15–500)
Eosinophils Relative: 0.2 %
HCT: 32.1 % — ABNORMAL LOW (ref 35.0–45.0)
Hemoglobin: 10.8 g/dL — ABNORMAL LOW (ref 11.7–15.5)
Lymphs Abs: 646 cells/uL — ABNORMAL LOW (ref 850–3900)
MCH: 30.4 pg (ref 27.0–33.0)
MCHC: 33.6 g/dL (ref 32.0–36.0)
MCV: 90.4 fL (ref 80.0–100.0)
MPV: 8.8 fL (ref 7.5–12.5)
Monocytes Relative: 7.3 %
Neutro Abs: 7183 cells/uL (ref 1500–7800)
Neutrophils Relative %: 84.5 %
Platelets: 360 10*3/uL (ref 140–400)
RBC: 3.55 10*6/uL — ABNORMAL LOW (ref 3.80–5.10)
RDW: 13.2 % (ref 11.0–15.0)
Total Lymphocyte: 7.6 %
WBC: 8.5 10*3/uL (ref 3.8–10.8)

## 2022-04-05 LAB — BASIC METABOLIC PANEL WITH GFR
BUN: 14 mg/dL (ref 7–25)
CO2: 28 mmol/L (ref 20–32)
Calcium: 7.2 mg/dL — ABNORMAL LOW (ref 8.6–10.4)
Chloride: 93 mmol/L — ABNORMAL LOW (ref 98–110)
Creat: 0.96 mg/dL (ref 0.50–1.03)
Glucose, Bld: 119 mg/dL (ref 65–139)
Potassium: 3.6 mmol/L (ref 3.5–5.3)
Sodium: 136 mmol/L (ref 135–146)
eGFR: 68 mL/min/{1.73_m2} (ref >=60)

## 2022-04-05 LAB — IRON,TIBC AND FERRITIN PANEL
%SAT: 8 % (calc) — ABNORMAL LOW (ref 16–45)
Ferritin: 248 ng/mL — ABNORMAL HIGH (ref 16–232)
Iron: 21 ug/dL — ABNORMAL LOW (ref 45–160)
TIBC: 255 mcg/dL (calc) (ref 250–450)

## 2022-04-05 LAB — TEST AUTHORIZATION

## 2022-04-05 LAB — URINE CULTURE
MICRO NUMBER:: 13859172
SPECIMEN QUALITY:: ADEQUATE

## 2022-04-05 LAB — TSH: TSH: 0.13 mIU/L — ABNORMAL LOW (ref 0.40–4.50)

## 2022-04-07 ENCOUNTER — Other Ambulatory Visit: Payer: Self-pay | Admitting: Adult Health

## 2022-04-07 MED ORDER — IRON (FERROUS SULFATE) 325 (65 FE) MG PO TABS: 325.0000 mg | ORAL_TABLET | Freq: Every day | ORAL | 0 refills | Status: DC

## 2022-04-07 NOTE — Progress Notes (Signed)
Hgb is low at 10.8, down from 11.6 -  Iron level 21, low, will need to take FeSO4 325 mg daily X 2  weeks and repeat CBC when tsh is going to be re-done.

## 2022-04-17 ENCOUNTER — Encounter: Payer: Self-pay | Admitting: Adult Health

## 2022-04-17 ENCOUNTER — Ambulatory Visit: Payer: BLUE CROSS/BLUE SHIELD | Admitting: Adult Health

## 2022-04-17 VITALS — BP 124/78 | HR 90 | Resp 16 | Ht <= 58 in | Wt 150.0 lb

## 2022-04-17 DIAGNOSIS — N390 Urinary tract infection, site not specified: Secondary | ICD-10-CM | POA: Diagnosis not present

## 2022-04-17 DIAGNOSIS — D509 Iron deficiency anemia, unspecified: Secondary | ICD-10-CM

## 2022-04-17 DIAGNOSIS — R002 Palpitations: Secondary | ICD-10-CM | POA: Diagnosis not present

## 2022-04-17 LAB — CBC WITH DIFFERENTIAL/PLATELET
Absolute Monocytes: 255 cells/uL (ref 200–950)
Basophils Absolute: 30 cells/uL (ref 0–200)
Basophils Relative: 0.8 %
Eosinophils Absolute: 80 cells/uL (ref 15–500)
Eosinophils Relative: 2.1 %
HCT: 30.9 % — ABNORMAL LOW (ref 35.0–45.0)
Hemoglobin: 10.4 g/dL — ABNORMAL LOW (ref 11.7–15.5)
Lymphs Abs: 973 cells/uL (ref 850–3900)
MCH: 30.9 pg (ref 27.0–33.0)
MCHC: 33.7 g/dL (ref 32.0–36.0)
MCV: 91.7 fL (ref 80.0–100.0)
MPV: 9.1 fL (ref 7.5–12.5)
Monocytes Relative: 6.7 %
Neutro Abs: 2462 cells/uL (ref 1500–7800)
Neutrophils Relative %: 64.8 %
Platelets: 326 10*3/uL (ref 140–400)
RBC: 3.37 10*6/uL — ABNORMAL LOW (ref 3.80–5.10)
RDW: 13.7 % (ref 11.0–15.0)
Total Lymphocyte: 25.6 %
WBC: 3.8 10*3/uL (ref 3.8–10.8)

## 2022-04-17 MED ORDER — VITAMIN C 500 MG PO CHEW: 1.0000 | CHEWABLE_TABLET | Freq: Every day | ORAL | 3 refills | Status: AC

## 2022-04-17 MED ORDER — IRON (FERROUS SULFATE) 325 (65 FE) MG PO TABS: 325.0000 mg | ORAL_TABLET | Freq: Every day | ORAL | 3 refills | Status: DC

## 2022-04-17 NOTE — Progress Notes (Signed)
Hgb 10.4, dropped from 10.8. Will need to continue taking FeSO4 325 mg daily and add Vitamin C 500 mg daily together with FeSO4 for better absorption. Will need to repeat CBC when she comes back for labs. Pls schedule GI consult for colonoscopy within insurance network.

## 2022-04-17 NOTE — Patient Instructions (Signed)

## 2022-04-17 NOTE — Progress Notes (Signed)
CBC ordered for 05/28/22, together with other labs.

## 2022-04-17 NOTE — Progress Notes (Signed)
[facility]  Provider:   Durenda Age DNP  Code Status:  Amy Eddleman Medina-Vargas DNP  Goals of Care:     04/03/2022   11:00 AM  Advanced Directives  Does Patient Have a Medical Advance Directive? No  Would patient like information on creating a medical advance directive? No - Patient declined     Chief Complaint  Patient presents with   2 Week Follow-up    HPI: Patient is a 61 y.o. female seen today for an acute visit for anemia follow up. She was started on Iron supplementation due to iron deficiency anemia with hgb 10.8. She came today to re-check hgb.   Tsh was noted to be low at 0.13, Euthyrox dosage was decreased from 112 mcg daily to 100 mcg daily.  She denies having dysuria, She completed Bactrim DS course.  Labs today showed hgb 10.4, down from 10.8 from 2 weeks ago.  Past Medical History:  Diagnosis Date   Abnormal Pap smear    Anemia    Cervical cancer [facility]) oncologist- dr Denman George   Stage IB1  SCCa    Dyspnea    Fatigue    History of radiation therapy 11/01/2018   IMRT 09/27/2018-11/01/2018 to Pelvis, Vagina / 45 Gy in 25 fractions  Dr Gery Pray   History of radiation therapy 12/01/2018   Vaginal Cuff (area of recurrence) / 24 Gy in 4 fractions 11/10/2018-12/01/2018  Dr Gery Pray   Hyperlipidemia    Left breast mass 2013   7x4x4 mm 6o'clock   PONV (postoperative nausea and vomiting)    Pre-diabetes    Sepsis ([place]) 03/2018   Urosepsis, Resolved   Thyroid nodule 03/25/2018   inferior right thyroid, noted on US thyroid   Ureterovaginal fistula    Urgency of urination    intermittant   Urinary frequency     Past Surgical History:  Procedure Laterality Date   ABDOMINAL HYSTERECTOMY     BREAST BIOPSY Left 06/15/2012   CYSTOSCOPY W/ RETROGRADES Bilateral 02/16/2018   Procedure: CYSTOSCOPY WITH RETROGRADE PYELOGRAM BILATERAL DIAGNOSTIC URETEROSCOPY, BILATERAL  STENT PLACEMENT;  Surgeon: Ardis Hughs, MD;  Location: [facility];  Service: Urology;   Laterality: Bilateral;   CYSTOSCOPY W/ URETERAL STENT PLACEMENT Bilateral 04/22/2018   Procedure: CYSTOSCOPY WITH BILATERAL RETROGRADE PYELOGRAM/ AND URETERAL STENT EXCHANGE, vaginal exam;  Surgeon: Ardis Hughs, MD;  Location: [facility];  Service: Urology;  Laterality: Bilateral;   CYSTOSCOPY W/ URETERAL STENT PLACEMENT Bilateral 05/28/2018   Procedure: CYSTOSCOPY WITH BILATERAL  RETROGRADE PYELOGRAM/URETERAL STENT REPLACEMENT, REMOVAL OF BILATERAL PERCUTANEOUS DRAINS;  Surgeon: Ardis Hughs, MD;  Location: [facility];  Service: Urology;  Laterality: Bilateral;   D & C LEEP CONIZATION BX  07-31-2003   dr p. rose Dean   IR IMAGING GUIDED PORT INSERTION  09/27/2018   IR NEPHROSTOGRAM RIGHT THRU EXISTING ACCESS  03/18/2018   IR NEPHROSTOMY EXCHANGE LEFT  04/27/2018   IR NEPHROSTOMY EXCHANGE RIGHT  04/27/2018   IR NEPHROSTOMY PLACEMENT LEFT  03/18/2018   IR NEPHROSTOMY PLACEMENT RIGHT  03/02/2018   IR REMOVAL TUN ACCESS W/ PORT W/O FL MOD SED  03/17/2019   PELVIC LYMPH NODE DISSECTION Bilateral 02/08/2018   Procedure: BILATEAL PEVIC  LYMPHADENECTOMY;  Surgeon: [name], MD;  Location: [facility];  Service: Gynecology;  Laterality: Bilateral;   ROBOTIC ASSISTED TOTAL HYSTERECTOMY WITH BILATERAL SALPINGO OOPHERECTOMY N/A 02/08/2018   Procedure: XI ROBOTIC ASSISTED TOTAL Radical HYSTERECTOMY WITH BILATERAL SALPINGO OOPHORECTOMY;  Surgeon: [name], MD;  Location: [facility];  Service: Gynecology;  Laterality: N/A;   Transvaginal tape placement  03-12-2009  dr Emeterio Reeve  [facility]   GYNECARE TENSION-FREE VAGINAL TAPE SLING   TUBAL LIGATION  05-28-2005  DR  MARSHALL  @ [facility]   PPTL    No Known Allergies  Outpatient Encounter Medications as of 04/17/2022  Medication Sig   calcitRIOL (ROCALTROL) 0.25 MCG capsule Take 1 capsule (0.25 mcg total) by mouth 3 (three) times daily.   calcium-vitamin D (OSCAL WITH D) 500-200 MG-UNIT TABS tablet Take by mouth.   gabapentin  (NEURONTIN) 100 MG capsule Take 1 capsule at night for 3 days, then add 1 capsule in the morning.  If after 2 weeks symptoms are no better call office for further instruction   Iron, Ferrous Sulfate, 325 (65 Fe) MG TABS Take 325 mg by mouth daily for 14 days.   levothyroxine (EUTHYROX) 100 MCG tablet Take 1 tablet (100 mcg total) by mouth daily before breakfast.   metoprolol tartrate (LOPRESSOR) 25 MG tablet Take 0.5 tablets (12.5 mg total) by mouth 2 (two) times daily as needed (take when HR is greater than 100).   No facility-administered encounter medications on file as of 04/17/2022.    Review of Systems:  Review of Systems  Constitutional:  Negative for appetite change, chills, fatigue and fever.  HENT:  Negative for congestion, hearing loss, rhinorrhea and sore throat.   Eyes: Negative.   Respiratory:  Negative for cough, shortness of breath and wheezing.   Cardiovascular:  Negative for chest pain, palpitations and leg swelling.  Gastrointestinal:  Negative for abdominal pain, constipation, diarrhea, nausea and vomiting.  Genitourinary:  Negative for dysuria.  Musculoskeletal:  Negative for arthralgias, back pain and myalgias.  Skin:  Negative for color change, rash and wound.  Neurological:  Negative for dizziness, weakness and headaches.  Psychiatric/Behavioral:  Negative for behavioral problems. The patient is not nervous/anxious.     Health Maintenance  Topic Date Due   COVID-19 Vaccine (1) Never done   FOOT EXAM  Never done   OPHTHALMOLOGY EXAM  Never done   Zoster Vaccines- Shingrix (1 of 2) Never done   MAMMOGRAM  12/23/2019   Diabetic kidney evaluation - Urine ACR  02/24/2020   Fecal DNA (Cologuard)  02/02/2021   INFLUENZA VACCINE  03/04/2022   HEMOGLOBIN A1C  09/12/2022   Diabetic kidney evaluation - GFR measurement  04/04/2023   PAP SMEAR-Modifier  11/04/2024   TETANUS/TDAP  02/24/2028   Hepatitis C Screening  Completed   HIV Screening  Completed   HPV VACCINES   Aged Out    Physical Exam: There were no vitals filed for this visit. There is no height or weight on file to calculate BMI. Physical Exam Constitutional:      Appearance: Normal appearance.  HENT:     Head: Normocephalic and atraumatic.     Nose: Nose normal.     Mouth/Throat:     Mouth: Mucous membranes are moist.  Eyes:     Conjunctiva/sclera: Conjunctivae normal.  Cardiovascular:     Rate and Rhythm: Normal rate and regular rhythm.  Pulmonary:     Effort: Pulmonary effort is normal.     Breath sounds: Normal breath sounds.  Abdominal:     General: Bowel sounds are normal.     Palpations: Abdomen is soft.  Musculoskeletal:        General: Normal range of motion.     Cervical back: Normal range of motion.  Skin:  General: Skin is warm and dry.  Neurological:     General: No focal deficit present.     Mental Status: She is alert and oriented to person, place, and time.  Psychiatric:        Mood and Affect: Mood normal.        Behavior: Behavior normal.        Thought Content: Thought content normal.        Judgment: Judgment normal.     Labs reviewed: Basic Metabolic Panel: Recent Labs    04/17/21 1207 11/04/21 1404 03/12/22 1006 04/03/22 1213  NA 140 141 139 136  K 3.7 3.6 3.7 3.6  CL 102 105 100 93*  CO2 '30 27 28 28  '$ GLUCOSE 141* 101* 134 119  BUN 15 23* 15 14  CREATININE 0.68 0.66 0.92 0.96  CALCIUM 8.5* 8.1* 8.1* 7.2*  TSH 0.33*  --   --  0.13*   Liver Function Tests: Recent Labs    04/17/21 1207 03/12/22 1006  AST 16 14  ALT 13 13  BILITOT 0.6 0.9  PROT 7.4 7.9   No results for input(s): "LIPASE", "AMYLASE" in the last 8760 hours. No results for input(s): "AMMONIA" in the last 8760 hours. CBC: Recent Labs    03/12/22 1006 04/03/22 1213  WBC 8.5 8.5  NEUTROABS  --  7,183  HGB 11.6* 10.8*  HCT 34.4* 32.1*  MCV 91.0 90.4  PLT 300 360   Lipid Panel: Recent Labs    04/17/21 1207  CHOL 181  HDL 51  LDLCALC 100*  TRIG 179*   CHOLHDL 3.5   Lab Results  Component Value Date   HGBA1C 6.3 (H) 03/12/2022    Procedures since last visit: No results found.  Assessment/Plan  1. Iron deficiency anemia, unspecified iron deficiency anemia type Lab Results  Component Value Date   HGB 10.4 (L) 04/17/2022   -  hgb 10.8, 04/03/22, dropped by 0.4 -  continue FeSO4 325 mg daily and add Vitamin C 500 mg daily for better absorption - CBC With Differential/Platelet  2. Palpitations -  denies having palpitations this week -  continue Metoprolol 12.5 mg BID PRN  3. Urinary tract infection without hematuria, site unspecified -  completed Bactrim DS course -  resolved   Labs/tests ordered:  CBC on 05/28/22  Next appt:  in 3 months or as needed

## 2022-05-04 NOTE — Progress Notes (Signed)
Radiation Oncology         ([phone]) [phone] ________________________________  Name: Amy Morrow MRN: [id number]  Date: 05/05/2022  DOB: [birth year]  Follow-Up Visit Note  CC: Wardell Honour, MD  Antony Blackbird, MD  No diagnosis found.  Diagnosis: Recurrent cervical cancer  Interval Since Last Radiation: 3 years, 5 months, and 2 days   Radiation treatment dates:    1. IMRT: 09/27/2018 - 11/01/2018 2. HDR: 11/10/2018, 11/18/2018, 11/24/2018, 12/01/2018   Site/dose:    1. Pelvis, Vagina / 45 Gy in 25 fractions 2. Vaginal Cuff (area of recurrence) / 24 Gy in 4 fractions  Narrative:  The patient returns today for routine annual follow-up. She was last seen here for follow-up on 08/22/21. Since her last visit, the patient followed up with Dr. Berline Lopes on 11/04/21. During which time, the patient reported progressive burning pain with radiation bilaterally towards her pelvis over the last year. She also saw her urologist recently and had an ultrasound that showed no issues with her kidneys, though she was treated for a UTI with abx. She otherwise denied any vaginal bleeding or discharge. Given her burning pain, Dr. Berline Lopes recommended getting a CT scan to rule out recurrent disease or other acute issues.     Pap smear performed during her visit with Dr. Berline Lopes showed no evidence of malignancy.   Subsequent CT scan of the abdomen and pelvis on 11/11/21 showed a small focus of air in the upper pole of the right kidney, possibly associated with infection versus recent instrumentation. Otherwise, CT showed no abdominal pelvic lymphadenopathy or hepatic steatosis, or evidence of recurrent disease. .         During a follow-up with Dr. Berline Lopes on 11/14/21, the patient reported improvement in the burning sensation along her right back/side since she as last seen. She denied any new symptoms.             The patient also had a recent screening mammogram on 04/30/22 which showed a possible mass in the  right breast. Diagnostic imaging is pending at this time.    ***           Allergies:  has No Known Allergies.  Meds: Current Outpatient Medications  Medication Sig Dispense Refill   Ascorbic Acid (VITAMIN C) 500 MG CHEW Chew 1 tablet (500 mg total) by mouth daily. 30 tablet 3   calcitRIOL (ROCALTROL) 0.25 MCG capsule Take 1 capsule (0.25 mcg total) by mouth 3 (three) times daily. 90 capsule 3   calcium-vitamin D (OSCAL WITH D) 500-200 MG-UNIT TABS tablet Take by mouth.     gabapentin (NEURONTIN) 100 MG capsule Take 1 capsule at night for 3 days, then add 1 capsule in the morning.  If after 2 weeks symptoms are no better call office for further instruction 90 capsule 0   Iron, Ferrous Sulfate, 325 (65 Fe) MG TABS Take 325 mg by mouth daily. 30 tablet 3   levothyroxine (EUTHYROX) 100 MCG tablet Take 1 tablet (100 mcg total) by mouth daily before breakfast. 30 tablet 3   metoprolol tartrate (LOPRESSOR) 25 MG tablet Take 0.5 tablets (12.5 mg total) by mouth 2 (two) times daily as needed (take when HR is greater than 100). 60 tablet 0   No current facility-administered medications for this encounter.    Physical Findings: The patient is in no acute distress. Patient is alert and oriented.  vitals were not taken for this visit. .  No significant changes. Lungs are clear to  auscultation bilaterally. Heart has regular rate and rhythm. No palpable cervical, supraclavicular, or axillary adenopathy. Abdomen soft, non-tender, normal bowel sounds.  On pelvic examination the external genitalia were unremarkable. A speculum exam was performed. There are no mucosal lesions noted in the vaginal vault. A Pap smear was obtained of the proximal vagina. On bimanual and rectovaginal examination there were no pelvic masses appreciated. ***     Lab Findings: Lab Results  Component Value Date   WBC 3.8 04/17/2022   HGB 10.4 (L) 04/17/2022   HCT 30.9 (L) 04/17/2022   MCV 91.7 04/17/2022   PLT 326  04/17/2022    Radiographic Findings: No results found.  Impression:  Recurrent cervical cancer  The patient is recovering from the effects of radiation.  ***  Plan:  ***   *** minutes of total time was spent for this patient encounter, including preparation, face-to-face counseling with the patient and coordination of care, physical exam, and documentation of the encounter. ____________________________________  Blair Promise, PhD, MD  This document serves as a record of services personally performed by Gery Pray, MD. It was created on his behalf by Roney Mans, a trained medical scribe. The creation of this record is based on the scribe's personal observations and the provider's statements to them. This document has been checked and approved by the attending provider.

## 2022-05-05 ENCOUNTER — Ambulatory Visit
Admission: RE | Admit: 2022-05-05 | Discharge: 2022-05-05 | Disposition: A | Discharge status: Home / Self Care | Payer: BLUE CROSS/BLUE SHIELD | Source: Ambulatory Visit | Attending: Radiation Oncology | Admitting: Radiation Oncology

## 2022-05-05 DIAGNOSIS — Z8541 Personal history of malignant neoplasm of cervix uteri: Secondary | ICD-10-CM | POA: Insufficient documentation

## 2022-05-05 DIAGNOSIS — Z923 Personal history of irradiation: Secondary | ICD-10-CM | POA: Diagnosis not present

## 2022-05-05 DIAGNOSIS — Z79890 Hormone replacement therapy (postmenopausal): Secondary | ICD-10-CM | POA: Diagnosis not present

## 2022-05-05 DIAGNOSIS — C539 Malignant neoplasm of cervix uteri, unspecified: Secondary | ICD-10-CM

## 2022-05-05 NOTE — Progress Notes (Signed)
Amy Morrow is here today for follow up post radiation to the pelvic.  They completed their radiation on: 12/01/2018   Does the patient complain of any of the following:  Pain:no Abdominal bloating: no Diarrhea/Constipation: no Nausea/Vomiting: no Vaginal Discharge: no Blood in Urine or Stool: no Urinary Issues (dysuria/incomplete emptying/ incontinence/ increased frequency/urgency): no - she did have a UTI a month ago and it better now after antibiotics. Does patient report using vaginal dilator 2-3 times a week and/or sexually active 2-3 weeks: no Post radiation skin changes: no   Additional comments if applicable: Patient has a cough that started last Thursday.  She has testing negative for Covid.    BP 126/77 (BP Location: Left Arm, Patient Position: Sitting, Cuff Size: Normal)   Pulse (!) 102   Temp 98.6 F (37 C)   Resp 18   Ht '4\' 9"'$  (1.448 m)   Wt 146 lb 12.8 oz (66.6 kg)   LMP 11/16/2012   SpO2 98%   BMI 31.77 kg/m

## 2022-05-15 ENCOUNTER — Other Ambulatory Visit: Payer: Self-pay

## 2022-05-15 DIAGNOSIS — D509 Iron deficiency anemia, unspecified: Secondary | ICD-10-CM

## 2022-05-15 DIAGNOSIS — R000 Tachycardia, unspecified: Secondary | ICD-10-CM

## 2022-05-15 DIAGNOSIS — E1140 Type 2 diabetes mellitus with diabetic neuropathy, unspecified: Secondary | ICD-10-CM

## 2022-05-15 DIAGNOSIS — E039 Hypothyroidism, unspecified: Secondary | ICD-10-CM

## 2022-05-27 ENCOUNTER — Encounter: Payer: Self-pay | Admitting: Family

## 2022-05-28 ENCOUNTER — Encounter: Payer: Self-pay | Admitting: Family

## 2022-05-28 ENCOUNTER — Other Ambulatory Visit: Payer: BLUE CROSS/BLUE SHIELD

## 2022-05-28 ENCOUNTER — Ambulatory Visit: Payer: BLUE CROSS/BLUE SHIELD | Admitting: Family

## 2022-05-28 DIAGNOSIS — R300 Dysuria: Secondary | ICD-10-CM

## 2022-05-28 DIAGNOSIS — R350 Frequency of micturition: Secondary | ICD-10-CM

## 2022-05-28 DIAGNOSIS — R8290 Unspecified abnormal findings in urine: Secondary | ICD-10-CM | POA: Diagnosis not present

## 2022-05-28 DIAGNOSIS — D509 Iron deficiency anemia, unspecified: Secondary | ICD-10-CM

## 2022-05-28 DIAGNOSIS — R0982 Postnasal drip: Secondary | ICD-10-CM | POA: Diagnosis not present

## 2022-05-28 LAB — CBC WITH DIFFERENTIAL/PLATELET
Absolute Monocytes: 392 cells/uL (ref 200–950)
Basophils Absolute: 22 cells/uL (ref 0–200)
Basophils Relative: 0.4 %
Eosinophils Absolute: 50 cells/uL (ref 15–500)
Eosinophils Relative: 0.9 %
HCT: 33.2 % — ABNORMAL LOW (ref 35.0–45.0)
Hemoglobin: 11.1 g/dL — ABNORMAL LOW (ref 11.7–15.5)
Lymphs Abs: 840 cells/uL — ABNORMAL LOW (ref 850–3900)
MCH: 29.8 pg (ref 27.0–33.0)
MCHC: 33.4 g/dL (ref 32.0–36.0)
MCV: 89.2 fL (ref 80.0–100.0)
MPV: 9 fL (ref 7.5–12.5)
Monocytes Relative: 7 %
Neutro Abs: 4295 cells/uL (ref 1500–7800)
Neutrophils Relative %: 76.7 %
Platelets: 338 10*3/uL (ref 140–400)
RBC: 3.72 10*6/uL — ABNORMAL LOW (ref 3.80–5.10)
RDW: 14 % (ref 11.0–15.0)
Total Lymphocyte: 15 %
WBC: 5.6 10*3/uL (ref 3.8–10.8)

## 2022-05-28 LAB — POCT URINALYSIS DIPSTICK
Bilirubin, UA: NEGATIVE
Glucose, UA: NEGATIVE
Nitrite, UA: POSITIVE
Protein, UA: POSITIVE — AB
Spec Grav, UA: 1.01 (ref 1.010–1.025)
Urobilinogen, UA: 0.2 E.U./dL
pH, UA: 7 (ref 5.0–8.0)

## 2022-05-28 MED ORDER — LORATADINE 10 MG PO TABS: 10.0000 mg | ORAL_TABLET | Freq: Every day | ORAL | 11 refills | Status: DC

## 2022-05-28 NOTE — Progress Notes (Signed)
Provider: Elmar Antigua FNP-C  Wardell Honour, MD  Patient Care Team: Wardell Honour, MD as PCP - General [facility])  Extended Emergency Contact Information Primary Emergency Contact: Anne,Serafin Address: [address]          [place], [place] [address] Johnnette Litter of [place] Phone: [phone] Mobile Phone: [phone] Relation: Spouse Secondary Emergency Contact: [place] Phone: [phone] Mobile Phone: [phone] Relation: Son  Code Status: Full Code  Goals of care: Advanced Directive information    05/28/2022   11:06 AM  Advanced Directives  Does Patient Have a Medical Advance Directive? Yes  Type of Paramedic of [place];Living will  Does patient want to make changes to medical advance directive? No - Patient declined  Copy of [place] in Chart? No - copy requested     Chief Complaint  Patient presents with   Acute Visit    Patient states that she's feeling cold and bones feel brittle. Patient states that she has odor in urine, urinary frequency, and dysuria.     HPI:  Pt is a 61 y.o. female seen today for an acute visit for evaluation of chills,urine frequency and dysuria  x 3 days.also states has had strong urine odor.She denies any fever,nausea,vomiting,abdominal pain,flank pain,urgency,dysuria,difficult urination or hematuria,     Past Medical History:  Diagnosis Date   Abnormal Pap smear    Anemia    Cervical cancer [facility]) oncologist- dr Denman George   Stage IB1  SCCa    Dyspnea    Fatigue    History of radiation therapy 11/01/2018   IMRT 09/27/2018-11/01/2018 to Pelvis, Vagina / 45 Gy in 25 fractions  Dr Gery Pray   History of radiation therapy 12/01/2018   Vaginal Cuff (area of recurrence) / 24 Gy in 4 fractions 11/10/2018-12/01/2018  Dr Gery Pray   Hyperlipidemia    Left breast mass 2013   7x4x4 mm 6o'clock   PONV (postoperative nausea and vomiting)     Pre-diabetes    Sepsis ([place]) 03/2018   Urosepsis, Resolved   Thyroid nodule 03/25/2018   inferior right thyroid, noted on US thyroid   Ureterovaginal fistula    Urgency of urination    intermittant   Urinary frequency    Past Surgical History:  Procedure Laterality Date   ABDOMINAL HYSTERECTOMY     BREAST BIOPSY Left 06/15/2012   CYSTOSCOPY W/ RETROGRADES Bilateral 02/16/2018   Procedure: CYSTOSCOPY WITH RETROGRADE PYELOGRAM BILATERAL DIAGNOSTIC URETEROSCOPY, BILATERAL  STENT PLACEMENT;  Surgeon: Ardis Hughs, MD;  Location: [facility];  Service: Urology;  Laterality: Bilateral;   CYSTOSCOPY W/ URETERAL STENT PLACEMENT Bilateral 04/22/2018   Procedure: CYSTOSCOPY WITH BILATERAL RETROGRADE PYELOGRAM/ AND URETERAL STENT EXCHANGE, vaginal exam;  Surgeon: Ardis Hughs, MD;  Location: [facility];  Service: Urology;  Laterality: Bilateral;   CYSTOSCOPY W/ URETERAL STENT PLACEMENT Bilateral 05/28/2018   Procedure: CYSTOSCOPY WITH BILATERAL  RETROGRADE PYELOGRAM/URETERAL STENT REPLACEMENT, REMOVAL OF BILATERAL PERCUTANEOUS DRAINS;  Surgeon: Ardis Hughs, MD;  Location: [facility];  Service: Urology;  Laterality: Bilateral;   D & C LEEP CONIZATION BX  07-31-2003   dr p. rose [place]   IR IMAGING GUIDED PORT INSERTION  09/27/2018   IR NEPHROSTOGRAM RIGHT THRU EXISTING ACCESS  03/18/2018   IR NEPHROSTOMY EXCHANGE LEFT  04/27/2018   IR NEPHROSTOMY EXCHANGE RIGHT  04/27/2018   IR NEPHROSTOMY PLACEMENT LEFT  03/18/2018   IR NEPHROSTOMY PLACEMENT RIGHT  03/02/2018   IR  REMOVAL TUN ACCESS W/ PORT W/O FL MOD SED  03/17/2019   PELVIC LYMPH NODE DISSECTION Bilateral 02/08/2018   Procedure: BILATEAL PEVIC  LYMPHADENECTOMY;  Surgeon: [name], MD;  Location: [facility];  Service: Gynecology;  Laterality: Bilateral;   ROBOTIC ASSISTED TOTAL HYSTERECTOMY WITH BILATERAL SALPINGO OOPHERECTOMY N/A 02/08/2018   Procedure: XI ROBOTIC ASSISTED TOTAL Radical HYSTERECTOMY WITH BILATERAL  SALPINGO OOPHORECTOMY;  Surgeon: [name], MD;  Location: [facility];  Service: Gynecology;  Laterality: N/A;   Transvaginal tape placement  03-12-2009  dr Emeterio Reeve  [facility]   GYNECARE TENSION-FREE VAGINAL TAPE SLING   TUBAL LIGATION  05-28-2005  DR  MARSHALL  @ [facility]   PPTL    No Known Allergies  Outpatient Encounter Medications as of 05/28/2022  Medication Sig   Ascorbic Acid (VITAMIN C) 500 MG CHEW Chew 1 tablet (500 mg total) by mouth daily.   calcitRIOL (ROCALTROL) 0.25 MCG capsule Take 1 capsule (0.25 mcg total) by mouth 3 (three) times daily.   calcium-vitamin D (OSCAL WITH D) 500-200 MG-UNIT TABS tablet Take by mouth.   Iron, Ferrous Sulfate, 325 (65 Fe) MG TABS Take 325 mg by mouth daily.   levothyroxine (EUTHYROX) 100 MCG tablet Take 1 tablet (100 mcg total) by mouth daily before breakfast.   metoprolol tartrate (LOPRESSOR) 25 MG tablet Take 0.5 tablets (12.5 mg total) by mouth 2 (two) times daily as needed (take when HR is greater than 100).   [DISCONTINUED] gabapentin (NEURONTIN) 100 MG capsule Take 1 capsule at night for 3 days, then add 1 capsule in the morning.  If after 2 weeks symptoms are no better call office for further instruction   No facility-administered encounter medications on file as of 05/28/2022.    Review of Systems  Constitutional:  Negative for appetite change, chills, fatigue, fever and unexpected weight change.  HENT:  Positive for postnasal drip and rhinorrhea. Negative for congestion, ear discharge, ear pain, hearing loss, nosebleeds, sinus pressure, sinus pain, sneezing, sore throat, tinnitus and trouble swallowing.   Eyes:  Negative for pain, discharge, redness, itching and visual disturbance.  Respiratory:  Negative for cough, chest tightness, shortness of breath and wheezing.   Cardiovascular:  Negative for chest pain, palpitations and leg swelling.  Gastrointestinal:  Positive for blood in stool. Negative for abdominal distention, abdominal pain,  constipation, diarrhea, nausea and vomiting.  Endocrine: Negative for cold intolerance, heat intolerance, polydipsia, polyphagia and polyuria.  Genitourinary:  Positive for dysuria and frequency. Negative for difficulty urinating, flank pain and urgency.  Musculoskeletal:  Negative for arthralgias, back pain, gait problem, joint swelling, myalgias, neck pain and neck stiffness.  Skin:  Negative for color change, pallor, rash and wound.  Neurological:  Negative for dizziness, syncope, speech difficulty, weakness, light-headedness, numbness and headaches.  Hematological:  Does not bruise/bleed easily.  Psychiatric/Behavioral:  Negative for agitation, behavioral problems, confusion, hallucinations and sleep disturbance. The patient is not nervous/anxious.     Immunization History  Administered Date(s) Administered   Influenza Split 07/11/2011   Influenza, High Dose Seasonal PF 05/26/2022   Influenza,inj,Quad PF,6-35 Mos 04/20/2019   Influenza-Unspecified 07/11/2011   PFIZER(Purple Top)SARS-COV-2 Vaccination 12/06/2019, 12/27/2019   Pneumococcal Polysaccharide-23 04/28/2021   Tdap 02/23/2018, 06/16/2021   Pertinent  Health Maintenance Due  Topic Date Due   FOOT EXAM  Never done   OPHTHALMOLOGY EXAM  Never done   MAMMOGRAM  12/23/2019   HEMOGLOBIN A1C  09/12/2022   PAP SMEAR-Modifier  11/04/2024   INFLUENZA VACCINE  Completed  11/27/2021   10:58 AM 03/12/2022    9:25 AM 04/03/2022   11:00 AM 05/05/2022   11:05 AM 05/28/2022   11:05 AM  Fall Risk  Falls in the past year? 0 0 0  0  Was there an injury with Fall? 0 0 0  0  Fall Risk Category Calculator 0 0 0  0  Fall Risk Category Low Low Low  Low  Patient Fall Risk Level Low fall risk Low fall risk Low fall risk Low fall risk Low fall risk  Patient at Risk for Falls Due to No Fall Risks No Fall Risks No Fall Risks  No Fall Risks  Fall risk Follow up Falls evaluation completed  Falls evaluation completed  Falls evaluation completed    Functional Status Survey:    Vitals:   05/28/22 1103  BP: 128/84  Pulse: (!) 119  Resp: 16  Temp: 98 F (36.7 C)  SpO2: 99%  Weight: 145 lb 6.4 oz (66 kg)  Height: '4\' 9"'$  (1.448 m)   Body mass index is 31.46 kg/m. Physical Exam Vitals reviewed.  Constitutional:      General: She is not in acute distress.    Appearance: Normal appearance. She is obese. She is not ill-appearing or diaphoretic.  HENT:     Head: Normocephalic.     Right Ear: Tympanic membrane, ear canal and external ear normal. There is no impacted cerumen.     Left Ear: Tympanic membrane, ear canal and external ear normal. There is no impacted cerumen.     Nose: Nose normal. No congestion or rhinorrhea.     Mouth/Throat:     Mouth: Mucous membranes are moist.     Pharynx: Oropharynx is clear. No oropharyngeal exudate or posterior oropharyngeal erythema.  Eyes:     General: No scleral icterus.       Right eye: No discharge.        Left eye: No discharge.     Extraocular Movements: Extraocular movements intact.     Conjunctiva/sclera: Conjunctivae normal.     Pupils: Pupils are equal, round, and reactive to light.  Neck:     Vascular: No carotid bruit.  Cardiovascular:     Rate and Rhythm: Normal rate and regular rhythm.     Pulses: Normal pulses.     Heart sounds: Normal heart sounds. No murmur heard.    No friction rub. No gallop.  Pulmonary:     Effort: Pulmonary effort is normal. No respiratory distress.     Breath sounds: Normal breath sounds. No wheezing, rhonchi or rales.  Chest:     Chest wall: No tenderness.  Abdominal:     General: Bowel sounds are normal. There is no distension.     Palpations: Abdomen is soft. There is no mass.     Tenderness: There is no abdominal tenderness. There is no right CVA tenderness, left CVA tenderness, guarding or rebound.  Musculoskeletal:        General: No swelling or tenderness. Normal range of motion.     Cervical back: Normal range of motion. No  rigidity or tenderness.     Right lower leg: No edema.     Left lower leg: No edema.  Lymphadenopathy:     Cervical: No cervical adenopathy.  Skin:    General: Skin is warm and dry.     Coloration: Skin is not pale.     Findings: No bruising, erythema, lesion or rash.  Neurological:     Mental Status:  She is alert and oriented to person, place, and time.     Cranial Nerves: No cranial nerve deficit.     Sensory: No sensory deficit.     Motor: No weakness.     Coordination: Coordination normal.     Gait: Gait normal.  Psychiatric:        Mood and Affect: Mood normal.        Speech: Speech normal.        Behavior: Behavior normal.        Thought Content: Thought content normal.        Judgment: Judgment normal.     Labs reviewed: Recent Labs    11/04/21 1404 03/12/22 1006 04/03/22 1213  NA 141 139 136  K 3.6 3.7 3.6  CL 105 100 93*  CO2 '27 28 28  '$ GLUCOSE 101* 134 119  BUN 23* 15 14  CREATININE 0.66 0.92 0.96  CALCIUM 8.1* 8.1* 7.2*   Recent Labs    03/12/22 1006  AST 14  ALT 13  BILITOT 0.9  PROT 7.9   Recent Labs    03/12/22 1006 04/03/22 1213 04/17/22 1036  WBC 8.5 8.5 3.8  NEUTROABS  --  7,183 2,462  HGB 11.6* 10.8* 10.4*  HCT 34.4* 32.1* 30.9*  MCV 91.0 90.4 91.7  PLT 300 360 326   Lab Results  Component Value Date   TSH 0.13 (L) 04/03/2022   Lab Results  Component Value Date   HGBA1C 6.3 (H) 03/12/2022   Lab Results  Component Value Date   CHOL 181 04/17/2021   HDL 51 04/17/2021   LDLCALC 100 (H) 04/17/2021   TRIG 179 (H) 04/17/2021   CHOLHDL 3.5 04/17/2021    Significant Diagnostic Results in last 30 days:  No results found.  Assessment/Plan 1. Urinary frequency Afebrile  - advised to increase fluid intake  - POC Urinalysis Dipstick indicates yellow cloudy urine with trace ketones ,large blood,positive for Nitrites and large leukocytes  3+ - Urine Culture  2. Abnormal urine odor Encouraged to increase fluid intake  - POC  Urinalysis Dipstick - Urine Culture  3. Dysuria Will send urine for culture  - POC Urinalysis Dipstick - Urine Culture  4. PND (post-nasal drip) Start on loratadine  Samples of zyrtec given during visit  - loratadine (CLARITIN) 10 MG tablet; Take 1 tablet (10 mg total) by mouth daily.  Dispense: 30 tablet; Refill: 11  5. Iron deficiency anemia, unspecified iron deficiency anemia type Hgb 10.4  - CBC with Differential/Platelet   Family/ staff Communication: Reviewed plan of care with patient verbalized understanding   Labs/tests ordered:  - POC Urinalysis Dipstick - Urine Culture - CBC with Differential/Platelet  Next Appointment: Return if symptoms worsen or fail to improve. Please  Sandrea Hughs, NP

## 2022-05-31 LAB — URINE CULTURE
MICRO NUMBER:: 14100407
SPECIMEN QUALITY:: ADEQUATE

## 2022-06-02 ENCOUNTER — Other Ambulatory Visit: Payer: Self-pay

## 2022-06-02 MED ORDER — SACCHAROMYCES BOULARDII 250 MG PO CAPS: 250.0000 mg | ORAL_CAPSULE | Freq: Two times a day (BID) | ORAL | 0 refills | Status: DC

## 2022-06-02 MED ORDER — CIPROFLOXACIN HCL 500 MG PO TABS: 500.0000 mg | ORAL_TABLET | Freq: Every day | ORAL | 0 refills | Status: AC

## 2022-06-19 ENCOUNTER — Telehealth: Payer: Self-pay | Admitting: *Deleted

## 2022-06-19 NOTE — Telephone Encounter (Signed)
Left a message for the patient's son to call the office back. Patient needs to be scheduled for a follow up in April with Dr Berline Lopes

## 2022-06-24 NOTE — Telephone Encounter (Signed)
eft a message for the patient's son to call the office back. Patient needs to be scheduled for a follow up in April with Dr Berline Lopes

## 2022-08-28 ENCOUNTER — Ambulatory Visit: Payer: BLUE CROSS/BLUE SHIELD | Admitting: Radiation Oncology

## 2022-08-28 ENCOUNTER — Ambulatory Visit: Payer: Self-pay | Admitting: Radiation Oncology

## 2022-09-08 ENCOUNTER — Telehealth: Payer: Self-pay | Admitting: *Deleted

## 2022-09-08 NOTE — Telephone Encounter (Signed)
Amy Morrow ([place] interpreter) will call the patient with a follow up appt on 4/12

## 2022-11-12 ENCOUNTER — Telehealth: Payer: Self-pay | Admitting: Oncology

## 2022-11-12 NOTE — Telephone Encounter (Signed)
Raynelle Fanning, Medical Interpreter will let Amy Morrow know that her appointment with Dr. Pricilla Holm has been rescheduled to 1:30 tomorrow.

## 2022-11-13 ENCOUNTER — Inpatient Hospital Stay: Payer: Self-pay | Admitting: Gynecologic Oncology

## 2022-11-13 DIAGNOSIS — C539 Malignant neoplasm of cervix uteri, unspecified: Secondary | ICD-10-CM

## 2022-12-24 ENCOUNTER — Telehealth: Payer: Self-pay | Admitting: *Deleted

## 2022-12-24 NOTE — Telephone Encounter (Signed)
Ms. Kunze was called through [place] interpreter [phone] to complete her meaningful    use questions for her appt,. Tomorrow with Dr. Pricilla Holm. Pt was  unable to be reached. Interpreter left Ms. Zierden a message to call the office at [phone].

## 2022-12-24 NOTE — Progress Notes (Unsigned)
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 Endocervix, curettage DETACHED FRAGMENTS OF SQUAMOUS MUCOSA WITH KOILOCYTIC ATYPIA, SEE COMMENT. 2. Cervix, biopsy LOW GRADE SQUAMOUS INTRAEPITHELIAL LESION, CIN-I (MILD DYSPLASIA), SEE COMMENT. Microscopic Comment 1. 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 2. The findings   01/09/2012 Pathology Results   LOW GRADE SQUAMOUS INTRAEPITHELIAL LESION: CIN-1/ HPV (LSIL). ENDOMETRIAL CELLS ARE PRESENT.   01/12/2018 Pathology Results   Cervix, biopsy - INVASIVE SQUAMOUS CELL CARCINOMA - SEE COMMENT   02/08/2018 Pathology Results   1. Lymph nodes, regional resection, right pelvic - EIGHT LYMPH NODES, NEGATIVE FOR CARCINOMA (0/8). 2. Lymph nodes, regional resection, left pelvic - TWELVE LYMPH NODES, NEGATIVE FOR CARCINOMA (0/12). 3. Uterus +/- tubes/ovaries, neoplastic, cervix, bilateral tubes and ovaries, upper third of vagina - INVASIVE MODERATELY DIFFERENTIATED SQUAMOUS CELL CARCINOMA, 2.6 CM, INVOLVING ANTERIOR PORTION OF CERVIX FROM 9 O'CLOCK TO 3 O'CLOCK. - TUMOR INVADES FOR DEPTH OF 0.5 CM AND IS CONFINED TO THE CERVIX. - ALL RESECTION MARGINS ARE NEGATIVE FOR CARCINOMA; THE CLOSEST IS THE VAGINAL CUFF MARGIN AT 1 CM. - NEGATIVE FOR LYMPHOVASCULAR OR PERINEURAL INVASION. - BENIGN ENDOMETRIAL POLYP, 2.6 CM. - BENIGN LEIOMYOMA, 2.2 CM - BENIGN INACTIVE ENDOMETRIUM. - BENIGN BILATERAL OVARIES AND FALLOPIAN TUBES. - SEE ONCOLOGY TABLE. Microscopic Comment 3. UTERINE CERVIX: Resection Procedure: Radial hysterectomy. Tumor Size: 2.6 cm. Histologic Type: Squamous cell carcinoma. Histologic Grade: G2: Moderately differentiated. Stromal Invasion: Depth of stromal invasion  (millimeters): 5 mm. Horizontal extent longitudinal/length (if applicable#) (millimeters): 20 mm. Horizontal extent circumferential/width (if applicable#) (millimeters): 26 mm. Other Tissue/ Organ: Not involved. Margins: Negative for carcinoma. Lymphovascular Invasion: Not identified. Regional Lymph Nodes: All lymph nodes negative for tumor cells Total Number of Lymph Nodes Examined: 20. Number of Sentinel Nodes Examined (if applicable): 0. Pathologic Stage Classification (pTNM, AJCC 8th Edition): pT1b1, pN0. Ancillary Studies: Not applicable. Representative Tumor Block: 3E, 48F, and 3G.   02/08/2018 Surgery   Operation: Robotic-assisted type III radical laparoscopic hysterectomy with bilateral salpingoophorectomy and bilateral pelvic lymphadenectomy   Surgeon: Quinn Axe    Operative Findings:  : 2-3cm pedunculated exophytic mass from right anterior cervix. No clinical involvement of the parametria, no suspicious nodes   02/16/2018 Surgery   Procedure: Cystoscopy, bilateral retrograde pyelograms with interpretation Diagnostic ureteroscopy Bilateral ureteral stent placement   Surgeon: Crist Fat, MD    Intraoperative findings:  #1: The retrograde pyelogram on the patient's right side initially demonstrated significant contrast extravasation several centimeters from the ureteral orifice.  There was proximal hydroureteronephrosis. #2: Ureteroscopy of the right ureter demonstrated a full-thickness ureteral injury likely thermal in nature approximately 2-1/2 cm from the UVJ. #3: The retrograde on the patient's left side demonstrated severe extravasation with no contrast getting beyond the first centimeter of the ureter.  Once beyond this area with a ureteroscope the ureter was mild to moderately dilated. 4.:  Ureteroscopy of the left ureter demonstrated an area approximately 2 cm from the UVJ and lasting approximately a centimeter and a half of devitalized tissue,  full-thickness, likely thermal in nature. #5: 5 French x22 cm Polaris stents were placed bilaterally.     03/22/2018 Imaging   Highly suspicious nodule in the inferior right thyroid lobe corresponds with the hypermetabolic activity on the PET-CT. Recommend ultrasound-guided biopsy of  this right thyroid nodule.   No other thyroid nodules.   04/22/2018 Surgery   Procedure: Cystoscopy Bilateral retrograde pyelogram with interpretation Bilateral ureteral stent exchange Left ureteroscopy, diagnostic   Surgeon: Crist Fat, MD    Intraoperative findings:  #1: On speculum exam there was a tine from the right ureteral stent (Polaris) noted at the vaginal cuff.  The remainder of the vaginal cuff was healed, including the area around the ureteral stent tine. 2.:  The right retrograde pyelogram was performed using 10 cc of Omni, and demonstrated normal caliber ureter with no significant hydroureteronephrosis.  There did not appear to be any communication or extravasation of contrast in the distal ureter or UVJ with the vagina. 3.:  The left retrograde pyelogram demonstrated a small area of extravasation at the UVJ, nearly the intramural ureter, that seemed to communicate with the patient's vagina.  There were no other significant filling defects, there is no hydroureteronephrosis. 4.:  Left diagnostic ureteroscopy demonstrated an abnormality within the intramural or very distal UVJ region where there was some bullous edema and likely the site of the fistula.  There was no identifiable communication.  The remaining aspect of the ureter was normal.   05/28/2018 Surgery   Procedure: Bilateral retrograde pyelogram with interpretation Bilateral diagnostic ureteroscopy Bilateral ureteral stent exchange Bilateral nephrostomy tube removal   Surgeon: Crist Fat, MD    Intraoperative findings:  #1: 5 French open-ended ureteral catheter was used to perform a retrograde pyelogram in the  patient's left ureter which demonstrated normal caliber ureter with no hydro-.  However, there was a narrowed segment at the intramural ureter and just at the UVJ with a prominent fistula draining into the vagina. #2: I removed the wire to see the how well the ureter with drain, it did not drain well at all.  As such I tried to repassed the wire and ultimately had to pass a ureteroscope through the distal ureter in order to get the wire up into the left renal pelvis.  This noted distorted and abnormal mucosa in the intravesical and ureterovesical junction. #3: The patient's right sided ureteral stent distal had migrated into the patient's vagina.  In order to remove the stent I had to perform ureteroscopy on the right ureter cannulating the right ureteral orifice and advancing it through the intramural ureter and beyond the distal ureter.  Once I was up beyond the fistula I passed the wire through the scope and into the right renal pelvis.  Once the wire was in the pelvis I removed the scope and pulled the stent through the patient's vagina. #4: I then exchanged the 0.38 sensor wire in the right collecting system for a 5 [place] open-ended ureteral catheter performed retrograde pyelogram which demonstrated prominent fistula in the distal ureter/UVJ. #5: A 24 cm x 6 French double-J ureteral stent was placed in the right ureter. #6: A 22 cm x 5 French Polaris catheter was placed in the patient's left ureter   07/02/2018 Surgery   Procedure: Robotic assisted laparoscopic bilateral ureteral reimplant   Surgeon: Crist Fat, MD First Assistant: Dr. Adolphus Birchwood, MD    Intraoperative findings:  #1: The patient's ureters were thickened, and adherent to the sidewalls, but there were able to be reimplanted without any tension. #2: The patient had a mid urethral sling mesh noted around the suprapubic bone that was unmolested.   08/27/2018 Pathology Results   Vagina, biopsy, left upper vaginal side -  wall - SQUAMOUS CELL CARCINOMA.  09/15/2018 Cancer Staging   Staging form: Cervix Uteri, AJCC 7th Edition - Pathologic: FIGO Stage IB1 (T1b1, N0, cM0) - Signed by Artis Delay, MD on 09/16/2018   09/27/2018 Procedure   Successful placement of a right IJ approach Power Port with ultrasound and fluoroscopic guidance. The catheter is ready for use.   09/27/2018 -  Radiation Therapy   She has daily radiation   10/01/2018 -  Chemotherapy   The patient had weekly cisplatin   03/02/2019 PET scan   1. No evidence of metastatic disease. Previously questioned hypermetabolism along the left vaginal cuff is not appreciated on the current study. 2. Hypermetabolic right thyroid nodule. Patient underwent ultrasound-guided biopsy 09/23/2018. 3.  Aortic atherosclerosis (ICD10-170.0).   03/17/2019 Procedure   Successful right IJ vein Port-A-Cath explant.   Primary thyroid cancer (HCC)  09/23/2018 Pathology Results   THYROID, FINE NEEDLE ASPIRATION, INFERIOR RIGHT (SPECIMEN 1 OF 1, COLLECTED 09/23/18): FINDINGS CONSISTENT WITH PAPILLARY CARCINOMA (B[place] CATEGORY VI).    She had been undergoing cervical cancer screening through [facility]. 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 01/2012 LSIL + endometrial cells 07/2012 Pap negative 01/2013 Pap negative with HRHPV not detected 01/2015 Pap negative and HRHPV not detected   Was noting postmenopausal bleeding and saw [facility] clinic again. 12/2017 Pap negative now HRHPV detected A mass was noted on exam and she was referred to the Gyn clinic where Dr. Shawnie Pons did a cervical biopsy 01/12/18 which showed cervical squamous cell carcinoma.    She underwent a PET/CT which showed no apparent metastatic disease. On 02/08/18 she underwent robotic assisted radical hysterectomy, upper vaginectomy, bilateral pelvic lymphadenectomy.  Operative findings were significant for a 2 to 3 cm pedunculated exophytic mass from the right anterior cervix.  There was no  clinical involvement of the parametrium and no suspicious nodes.  The surgery was overall fairly uncomplicated there was some increased bleeding encountered during the dissection around the posterior bladder pillars.  At the completion of the procedure the ureters bilaterally appeared intact and were completely skeletonized in the distal third free from all parametrial tissue.  Her tumor was staged as IB2, squamous cell carcinoma of the cervix, and low risk features were noted therefore no adjuvant therapy was recommended.   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 Marlou Porch.  During that cystoscopic procedure Dr. Marlou Porch placed ureteral stents bilaterally.  On 03/02/18 she underwent right percutaneous nephrostomy tube placement. After placement of the right PCN she noted slight decrease in urinary leakage from the vagina, though still persisted, therefore placement of a left PCN was placed on 03/18/18.   She was followed by Dr Marlou Porch who performed ureteroscopy in early October, 2019 and identified resolution of left fistula and almost resolution in right.  On 06/24/18 Dr Marlou Porch performed robotic assisted bilateral ureteral reimplantation (ureteroneocystotomy).   She was seen on 08/27/17 as part of routine scheduled surveillance. She denied vaginal bleeding symptoms.  However, on vaginal examiation, a soft exophytic 1cm lesion was identified at the left vaginal fornix. It was biopsied. It was resulted as squamous cell carcinoma, consistent with recurrence.  PET showed localized recurrent disease at the left vaginal fornix and no distant sites (though a thyroid nodule was pet avid).  She was treated with salvage chemoradiation between September 27, 2018 and December 01, 2018.  This included 5 cycles of weekly radiosensitizing cisplatin 40 mg  per metered squared.  Additionally which she  received IMRT with 45 Gray in 25 fractions to the pelvis and vagina between therapy 24th and November 01, 2018, and high-dose-rate vaginal cuff brachytherapy, 24 Gray in 4 fractions, delivered between April 8 and December 01, 2018.   She tolerated therapy well and had no adverse effects.   Post treatment PET was performed (prior to her thyroidectomy) on March 02, 2019.  This revealed persistent focal hypermetabolism in the right lobe of the thyroid, no evidence of metastatic disease, previously questioned hypermetabolism along the left vaginal cuff was not appreciated. Work-up of the thyroid findings confirmed thyroid cancer and she then underwent thyroidectomy at [facility] hospital and subsequent radioactive iodine.   She was felt to have had a complete clinical response to her salvage therapy for recurrent cervical cancer and transitioned to surveillance.   Pap on 12/24/20 showed ASCUS, negative for high risk HPV. She has symptoms of urinary frequency (negative testing for UTI). Most likely attributable to her radiation.    Interval History: Doing well.  Denies any vaginal bleeding or discharge.  Has not been using her vaginal dilator.  Reports some dyspareunia.  Endorses normal bowel bladder function.  Denies any abdominal or pelvic pain outside of sexual activity.  Past Medical/Surgical History: Past Medical History:  Diagnosis Date   Abnormal Pap smear    Anemia    Cervical cancer [facility]) oncologist- dr Andrey Farmer   Stage IB1  SCCa    Dyspnea    Fatigue    History of radiation therapy 11/01/2018   IMRT 09/27/2018-11/01/2018 to Pelvis, Vagina / 45 Gy in 25 fractions  Dr Antony Blackbird   History of radiation therapy 12/01/2018   Vaginal Cuff (area of recurrence) / 24 Gy in 4 fractions 11/10/2018-12/01/2018  Dr Antony Blackbird   Hyperlipidemia    Left breast mass 2013   7x4x4 mm 6o'clock   PONV (postoperative nausea and vomiting)    Pre-diabetes    Sepsis (HCC) 03/2018   Urosepsis, Resolved    Thyroid nodule 03/25/2018   inferior right thyroid, noted on US thyroid   Ureterovaginal fistula    Urgency of urination    intermittant   Urinary frequency     Past Surgical History:  Procedure Laterality Date   ABDOMINAL HYSTERECTOMY     BREAST BIOPSY Left 06/15/2012   CYSTOSCOPY W/ RETROGRADES Bilateral 02/16/2018   Procedure: CYSTOSCOPY WITH RETROGRADE PYELOGRAM BILATERAL DIAGNOSTIC URETEROSCOPY, BILATERAL  STENT PLACEMENT;  Surgeon: Crist Fat, MD;  Location: [facility];  Service: Urology;  Laterality: Bilateral;   CYSTOSCOPY W/ URETERAL STENT PLACEMENT Bilateral 04/22/2018   Procedure: CYSTOSCOPY WITH BILATERAL RETROGRADE PYELOGRAM/ AND URETERAL STENT EXCHANGE, vaginal exam;  Surgeon: Crist Fat, MD;  Location: [facility];  Service: Urology;  Laterality: Bilateral;   CYSTOSCOPY W/ URETERAL STENT PLACEMENT Bilateral 05/28/2018   Procedure: CYSTOSCOPY WITH BILATERAL  RETROGRADE PYELOGRAM/URETERAL STENT REPLACEMENT, REMOVAL OF BILATERAL PERCUTANEOUS DRAINS;  Surgeon: Crist Fat, MD;  Location: [facility];  Service: Urology;  Laterality: Bilateral;   D & C LEEP CONIZATION BX  07-31-2003   dr p. rose [facility]   IR IMAGING GUIDED PORT INSERTION  09/27/2018   IR NEPHROSTOGRAM RIGHT THRU EXISTING ACCESS  03/18/2018   IR NEPHROSTOMY EXCHANGE LEFT  04/27/2018   IR NEPHROSTOMY EXCHANGE RIGHT  04/27/2018   IR NEPHROSTOMY PLACEMENT LEFT  03/18/2018   IR NEPHROSTOMY PLACEMENT RIGHT  03/02/2018   IR REMOVAL TUN ACCESS W/ PORT W/O FL  MOD SED  03/17/2019   PELVIC LYMPH NODE DISSECTION Bilateral 02/08/2018   Procedure: BILATEAL PEVIC  LYMPHADENECTOMY;  Surgeon: Adolphus Birchwood, MD;  Location: [facility];  Service: Gynecology;  Laterality: Bilateral;   ROBOTIC ASSISTED TOTAL HYSTERECTOMY WITH BILATERAL SALPINGO OOPHERECTOMY N/A 02/08/2018   Procedure: XI ROBOTIC ASSISTED TOTAL Radical HYSTERECTOMY WITH BILATERAL SALPINGO OOPHORECTOMY;  Surgeon: Adolphus Birchwood, MD;  Location:  [facility];  Service: Gynecology;  Laterality: N/A;   Transvaginal tape placement  03-12-2009  dr Scheryl Darter  [facility]   GYNECARE TENSION-FREE VAGINAL TAPE SLING   TUBAL LIGATION  05-28-2005  DR  MARSHALL  @ [facility]   PPTL    Family History  Problem Relation Age of Onset   Hypertension Mother    Heart disease Mother    Hypertension Brother    Heart disease Brother     Social History   Socioeconomic History   Marital status: Married    Spouse name: Not on file   Number of children: Not on file   Years of education: Not on file   Highest education level: Not on file  Occupational History   Not on file  Tobacco Use   Smoking status: Never   Smokeless tobacco: Never  Vaping Use   Vaping Use: Never used  Substance and Sexual Activity   Alcohol use: Never   Drug use: Never   Sexual activity: Yes    Birth control/protection: Surgical  Other Topics Concern   Not on file  Social History Narrative   Not on file   Social Determinants of Health   Financial Resource Strain: Not on file  Food Insecurity: Not on file  Transportation Needs: Not on file  Physical Activity: Not on file  Stress: Not on file  Social Connections: Not on file    Current Medications:  Current Outpatient Medications:    Ascorbic Acid (VITAMIN C) 500 MG CHEW, Chew 1 tablet (500 mg total) by mouth daily., Disp: 30 tablet, Rfl: 3   calcitRIOL (ROCALTROL) 0.25 MCG capsule, Take 1 capsule (0.25 mcg total) by mouth 3 (three) times daily., Disp: 90 capsule, Rfl: 3   calcium-vitamin D (OSCAL WITH D) 500-200 MG-UNIT TABS tablet, Take by mouth., Disp: , Rfl:    Iron, Ferrous Sulfate, 325 (65 Fe) MG TABS, Take 325 mg by mouth daily., Disp: 30 tablet, Rfl: 3   levothyroxine (EUTHYROX) 100 MCG tablet, Take 1 tablet (100 mcg total) by mouth daily before breakfast., Disp: 30 tablet, Rfl: 3   loratadine (CLARITIN) 10 MG tablet, Take 1 tablet (10 mg total) by mouth daily., Disp: 30 tablet, Rfl: 11   metoprolol tartrate  (LOPRESSOR) 25 MG tablet, Take 0.5 tablets (12.5 mg total) by mouth 2 (two) times daily as needed (take when HR is greater than 100)., Disp: 60 tablet, Rfl: 0   saccharomyces boulardii (FLORASTOR) 250 MG capsule, Take 1 capsule (250 mg total) by mouth 2 (two) times daily., Disp: 20 capsule, Rfl: 0  Review of Systems: Denies appetite changes, fevers, chills, fatigue, unexplained weight changes. Denies hearing loss, neck lumps or masses, mouth sores, ringing in ears or voice changes. Denies cough or wheezing.  Denies shortness of breath. Denies chest pain or palpitations. Denies leg swelling. Denies abdominal distention, pain, blood in stools, constipation, diarrhea, nausea, vomiting, or early satiety. Denies pain with intercourse, dysuria, frequency, hematuria or incontinence. Denies hot flashes, pelvic pain, vaginal bleeding or vaginal discharge.   Denies joint pain, back pain or muscle pain/cramps. Denies itching, rash, or wounds.  Denies dizziness, headaches, numbness or seizures. Denies swollen lymph nodes or glands, denies easy bruising or bleeding. Denies anxiety, depression, confusion, or decreased concentration.  Physical Exam: BP 130/75 (BP Location: Left Arm, Patient Position: Sitting)   Pulse 82   Resp 18   Ht 4' 11.45" (1.51 m)   Wt 155 lb (70.3 kg)   LMP 11/16/2012   SpO2 100%   BMI 30.84 kg/m  General: Alert, oriented, no acute distress. HEENT: Normocephalic, atraumatic, sclera anicteric. Chest: Clear to auscultation bilaterally.  No wheezes or rhonchi. Cardiovascular: Regular rate and rhythm, no murmurs. Abdomen: Obese, soft, nontender.  Normoactive bowel sounds.  No masses or hepatosplenomegaly appreciated.  Well-healed incisions. Back: No CVA tenderness. Extremities: Grossly normal range of motion.  Warm, well perfused.  No edema bilaterally. Skin: No rashes or lesions noted. Lymphatics: No cervical, supraclavicular, or inguinal adenopathy. GU: Normal appearing  external genitalia without erythema, excoriation, or lesions.  Speculum exam reveals moderately atrophic, radiation changes noted.  No masses or lesions, no atypical vascularity.  No bleeding or discharge.  Bimanual exam reveals vagina smooth, no masses or nodularity.  Rectovaginal exam confirms these findings.  Laboratory & Radiologic Studies: None new  Assessment & Plan: Amy Morrow is a 62 y.o. 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  Postoperative period after her initial diagnosis was complicated by development of bilateral ureterovaginal fistulas, repaired in 06/2018. CT imaging negative for recurrent disease last in 11/2021.   The patient is overall doing well.  She is NED on exam today.   I encouraged her to use vaginal dilator; she was given a new one today as she no longer has hers.  In the setting of her dyspareunia, we also discussed referral to pelvic floor physical therapy.  She like to think about this, and I asked her to call if she would like a referral placed before her next visit.  Patient's last Pap smear was in May 2022.  Pap test and HPV collected today.  I will call her with these results.   Per NCCN surveillance recommendations, we will continue with visits every 6 months alternating between radiation oncology and our practice.   22 minutes of total time was spent for this patient encounter, including preparation, face-to-face counseling with the patient and coordination of care, and documentation of the encounter.  Eugene Garnet, MD  Division of Gynecologic Oncology  Department of Obstetrics and Gynecology  [facility]

## 2022-12-25 ENCOUNTER — Encounter: Payer: Self-pay | Admitting: Gynecologic Oncology

## 2022-12-25 ENCOUNTER — Other Ambulatory Visit: Payer: Self-pay

## 2022-12-25 ENCOUNTER — Inpatient Hospital Stay: Payer: 59 | Attending: Gynecologic Oncology | Admitting: Gynecologic Oncology

## 2022-12-25 ENCOUNTER — Telehealth: Payer: Self-pay | Admitting: *Deleted

## 2022-12-25 ENCOUNTER — Other Ambulatory Visit (HOSPITAL_COMMUNITY)
Admission: RE | Admit: 2022-12-25 | Discharge: 2022-12-25 | Disposition: A | Discharge status: Home / Self Care | Payer: 59 | Source: Ambulatory Visit

## 2022-12-25 VITALS — BP 130/75 | HR 82 | Resp 18 | Ht 59.45 in | Wt 155.0 lb

## 2022-12-25 DIAGNOSIS — C539 Malignant neoplasm of cervix uteri, unspecified: Secondary | ICD-10-CM | POA: Insufficient documentation

## 2022-12-25 DIAGNOSIS — Z9221 Personal history of antineoplastic chemotherapy: Secondary | ICD-10-CM | POA: Insufficient documentation

## 2022-12-25 DIAGNOSIS — N9419 Other specified dyspareunia: Secondary | ICD-10-CM | POA: Insufficient documentation

## 2022-12-25 DIAGNOSIS — Z923 Personal history of irradiation: Secondary | ICD-10-CM | POA: Diagnosis not present

## 2022-12-25 DIAGNOSIS — Z85850 Personal history of malignant neoplasm of thyroid: Secondary | ICD-10-CM | POA: Insufficient documentation

## 2022-12-25 DIAGNOSIS — Z8541 Personal history of malignant neoplasm of cervix uteri: Secondary | ICD-10-CM | POA: Diagnosis present

## 2022-12-25 DIAGNOSIS — Z90710 Acquired absence of both cervix and uterus: Secondary | ICD-10-CM | POA: Insufficient documentation

## 2022-12-25 DIAGNOSIS — N941 Unspecified dyspareunia: Secondary | ICD-10-CM

## 2022-12-25 NOTE — Patient Instructions (Addendum)
It was good to see you today.  I do not see or feel any evidence of cancer recurrence on your exam.  I will see you for follow-up in 12 months. You will see Dr. Roselind Messier for follow-up in 6 months.  As always, if you develop any new and concerning symptoms before your next visit, please call to see me sooner.  Our clinic number is [phone].

## 2022-12-25 NOTE — Telephone Encounter (Signed)
Spoke with the patient's husband and canceled appt in Nov. Explained needs appt in one year

## 2023-01-06 ENCOUNTER — Telehealth: Payer: Self-pay | Admitting: *Deleted

## 2023-01-06 LAB — CYTOLOGY - PAP
Comment: NEGATIVE
Diagnosis: NEGATIVE
Diagnosis: REACTIVE
High risk HPV: NEGATIVE

## 2023-01-06 NOTE — Telephone Encounter (Signed)
-----   Message from Carver Fila, MD sent at 01/06/2023  8:54 AM EDT ----- Could you call her to let her know pap and HPV are negative (normal)? Thank you

## 2023-01-06 NOTE — Telephone Encounter (Signed)
Spoke with Amy Morrow through [facility] [phone] and relayed message from Dr. Pricilla Holm that her pap and HPV results are negative and normal.  Patient thanked the office for the good news and advised to call with any concerns or questions.

## 2023-01-19 ENCOUNTER — Ambulatory Visit (INDEPENDENT_AMBULATORY_CARE_PROVIDER_SITE_OTHER): Payer: 59 | Admitting: Adult Health

## 2023-01-19 ENCOUNTER — Encounter: Payer: Self-pay | Admitting: Adult Health

## 2023-01-19 VITALS — BP 130/80 | HR 84 | Temp 98.0°F | Resp 18 | Ht 59.25 in | Wt 156.8 lb

## 2023-01-19 DIAGNOSIS — R058 Other specified cough: Secondary | ICD-10-CM

## 2023-01-19 DIAGNOSIS — J029 Acute pharyngitis, unspecified: Secondary | ICD-10-CM

## 2023-01-19 DIAGNOSIS — J302 Other seasonal allergic rhinitis: Secondary | ICD-10-CM | POA: Diagnosis not present

## 2023-01-19 LAB — POCT INFLUENZA A/B
Influenza A, POC: NEGATIVE
Influenza B, POC: NEGATIVE

## 2023-01-19 LAB — POCT RAPID STREP A (OFFICE): Rapid Strep A Screen: NEGATIVE

## 2023-01-19 LAB — POC COVID19 BINAXNOW: SARS Coronavirus 2 Ag: NEGATIVE

## 2023-01-19 MED ORDER — DM-GUAIFENESIN ER 30-600 MG PO TB12
1.0000 | ORAL_TABLET | Freq: Two times a day (BID) | ORAL | 0 refills | Status: AC
Start: 2023-01-19 — End: 2023-02-02

## 2023-01-19 MED ORDER — AZITHROMYCIN 250 MG PO TABS
ORAL_TABLET | ORAL | 0 refills | Status: AC
Start: 2023-01-19 — End: 2023-01-24

## 2023-01-19 NOTE — Progress Notes (Signed)
[facility] clinic  Provider: Kenard Gower DNP  Code Status:  Full Code  Goals of Care:     01/19/2023   10:07 AM  Advanced Directives  Does Patient Have a Medical Advance Directive? No  Would patient like information on creating a medical advance directive? No - Patient declined     Chief Complaint  Patient presents with   Acute Visit    Productive cough, itchy throat     HPI: Patient is a 62 y.o. female seen today for an acute visit for productive cough and itchy throat. It has been 2 weeks when she started having productive cough, soar and itchy throat. Coughing keeps her awake at night. She sometimes becomes short of breath when coughing. She denies chills nor fever. She takes care of her grandson who is, also, sick. She takes for seasonal allergy.  She was negative for COVID-19, rapid strep A and influenza A/B.  Past Medical History:  Diagnosis Date   Abnormal Pap smear    Anemia    Cervical cancer [facility]) oncologist- dr Andrey Farmer   Stage IB1  SCCa    Dyspnea    Fatigue    History of radiation therapy 11/01/2018   IMRT 09/27/2018-11/01/2018 to Pelvis, Vagina / 45 Gy in 25 fractions  Dr Antony Blackbird   History of radiation therapy 12/01/2018   Vaginal Cuff (area of recurrence) / 24 Gy in 4 fractions 11/10/2018-12/01/2018  Dr Antony Blackbird   Hyperlipidemia    Left breast mass 2013   7x4x4 mm 6o'clock   PONV (postoperative nausea and vomiting)    Pre-diabetes    Sepsis (HCC) 03/2018   Urosepsis, Resolved   Thyroid nodule 03/25/2018   inferior right thyroid, noted on US thyroid   Ureterovaginal fistula    Urgency of urination    intermittant   Urinary frequency     Past Surgical History:  Procedure Laterality Date   ABDOMINAL HYSTERECTOMY     BREAST BIOPSY Left 06/15/2012   CYSTOSCOPY W/ RETROGRADES Bilateral 02/16/2018   Procedure: CYSTOSCOPY WITH RETROGRADE PYELOGRAM BILATERAL DIAGNOSTIC URETEROSCOPY, BILATERAL  STENT PLACEMENT;  Surgeon: Crist Fat, MD;   Location: [facility];  Service: Urology;  Laterality: Bilateral;   CYSTOSCOPY W/ URETERAL STENT PLACEMENT Bilateral 04/22/2018   Procedure: CYSTOSCOPY WITH BILATERAL RETROGRADE PYELOGRAM/ AND URETERAL STENT EXCHANGE, vaginal exam;  Surgeon: Crist Fat, MD;  Location: [facility];  Service: Urology;  Laterality: Bilateral;   CYSTOSCOPY W/ URETERAL STENT PLACEMENT Bilateral 05/28/2018   Procedure: CYSTOSCOPY WITH BILATERAL  RETROGRADE PYELOGRAM/URETERAL STENT REPLACEMENT, REMOVAL OF BILATERAL PERCUTANEOUS DRAINS;  Surgeon: Crist Fat, MD;  Location: [facility];  Service: Urology;  Laterality: Bilateral;   D & C LEEP CONIZATION BX  07-31-2003   dr p. rose [facility]   IR IMAGING GUIDED PORT INSERTION  09/27/2018   IR NEPHROSTOGRAM RIGHT THRU EXISTING ACCESS  03/18/2018   IR NEPHROSTOMY EXCHANGE LEFT  04/27/2018   IR NEPHROSTOMY EXCHANGE RIGHT  04/27/2018   IR NEPHROSTOMY PLACEMENT LEFT  03/18/2018   IR NEPHROSTOMY PLACEMENT RIGHT  03/02/2018   IR REMOVAL TUN ACCESS W/ PORT W/O FL MOD SED  03/17/2019   PELVIC LYMPH NODE DISSECTION Bilateral 02/08/2018   Procedure: BILATEAL PEVIC  LYMPHADENECTOMY;  Surgeon: Adolphus Birchwood, MD;  Location: [facility];  Service: Gynecology;  Laterality: Bilateral;   ROBOTIC ASSISTED TOTAL HYSTERECTOMY WITH BILATERAL SALPINGO OOPHERECTOMY N/A 02/08/2018   Procedure: XI ROBOTIC ASSISTED TOTAL Radical HYSTERECTOMY WITH BILATERAL SALPINGO OOPHORECTOMY;  Surgeon: Andrey Farmer,  Kara Mead, MD;  Location: [facility];  Service: Gynecology;  Laterality: N/A;   Transvaginal tape placement  03-12-2009  dr Scheryl Darter  [facility]   GYNECARE TENSION-FREE VAGINAL TAPE SLING   TUBAL LIGATION  05-28-2005  DR  MARSHALL  @ [facility]   PPTL    Not on File  Outpatient Encounter Medications as of 01/19/2023  Medication Sig   Ascorbic Acid (VITAMIN C) 500 MG CHEW Chew 1 tablet (500 mg total) by mouth daily.   calcitRIOL (ROCALTROL) 0.25 MCG capsule Take 1 capsule (0.25 mcg total) by mouth 3 (three)  times daily.   calcium-vitamin D (OSCAL WITH D) 500-200 MG-UNIT TABS tablet Take by mouth.   Iron, Ferrous Sulfate, 325 (65 Fe) MG TABS Take 325 mg by mouth daily.   levothyroxine (EUTHYROX) 100 MCG tablet Take 1 tablet (100 mcg total) by mouth daily before breakfast.   loratadine (CLARITIN) 10 MG tablet Take 1 tablet (10 mg total) by mouth daily.   metoprolol tartrate (LOPRESSOR) 25 MG tablet Take 0.5 tablets (12.5 mg total) by mouth 2 (two) times daily as needed (take when HR is greater than 100).   saccharomyces boulardii (FLORASTOR) 250 MG capsule Take 1 capsule (250 mg total) by mouth 2 (two) times daily.   No facility-administered encounter medications on file as of 01/19/2023.    Review of Systems:  Review of Systems  Constitutional:  Negative for appetite change, chills, fatigue and fever.  HENT:  Positive for sore throat. Negative for congestion, hearing loss and rhinorrhea.        Itchy throat  Eyes: Negative.   Respiratory:  Positive for cough and shortness of breath. Negative for wheezing.   Cardiovascular:  Negative for chest pain, palpitations and leg swelling.  Gastrointestinal:  Negative for abdominal pain, constipation, diarrhea, nausea and vomiting.  Genitourinary:  Negative for dysuria.  Musculoskeletal:  Negative for arthralgias, back pain and myalgias.  Skin:  Negative for color change, rash and wound.  Neurological:  Negative for dizziness, weakness and headaches.  Psychiatric/Behavioral:  Negative for behavioral problems. The patient is not nervous/anxious.     Health Maintenance  Topic Date Due   FOOT EXAM  Never done   OPHTHALMOLOGY EXAM  Never done   Diabetic kidney evaluation - Urine ACR  Never done   Zoster Vaccines- Shingrix (1 of 2) Never done   MAMMOGRAM  12/23/2019   COVID-19 Vaccine (3 - Pfizer risk series) 01/24/2020   Fecal DNA (Cologuard)  02/02/2021   HEMOGLOBIN A1C  09/12/2022   INFLUENZA VACCINE  03/05/2023   Diabetic kidney evaluation -  eGFR measurement  04/04/2023   PAP SMEAR-Modifier  12/24/2025   DTaP/Tdap/Td (3 - Td or Tdap) 06/17/2031   Hepatitis C Screening  Completed   HIV Screening  Completed   HPV VACCINES  Aged Out    Physical Exam: Vitals:   01/19/23 1008  BP: 130/80  Pulse: 84  Resp: 18  Temp: 98 F (36.7 C)  SpO2: 98%  Weight: 156 lb 12.8 oz (71.1 kg)  Height: 4' 11.25" (1.505 m)   Body mass index is 31.4 kg/m. Physical Exam Constitutional:      Appearance: She is obese.  HENT:     Head: Normocephalic and atraumatic.     Nose: Nose normal.     Mouth/Throat:     Mouth: Mucous membranes are moist.     Pharynx: Posterior oropharyngeal erythema present.  Eyes:     Conjunctiva/sclera: Conjunctivae normal.  Cardiovascular:  Rate and Rhythm: Normal rate and regular rhythm.  Pulmonary:     Effort: Pulmonary effort is normal.     Breath sounds: Normal breath sounds.  Abdominal:     General: Bowel sounds are normal.     Palpations: Abdomen is soft.  Musculoskeletal:        General: Normal range of motion.     Cervical back: Normal range of motion.  Skin:    General: Skin is warm and dry.  Neurological:     General: No focal deficit present.     Mental Status: She is alert and oriented to person, place, and time.  Psychiatric:        Mood and Affect: Mood normal.        Behavior: Behavior normal.        Thought Content: Thought content normal.        Judgment: Judgment normal.     Labs reviewed: Basic Metabolic Panel: Recent Labs    03/12/22 1006 04/03/22 1213  NA 139 136  K 3.7 3.6  CL 100 93*  CO2 28 28  GLUCOSE 134 119  BUN 15 14  CREATININE 0.92 0.96  CALCIUM 8.1* 7.2*  TSH  --  0.13*   Liver Function Tests: Recent Labs    03/12/22 1006  AST 14  ALT 13  BILITOT 0.9  PROT 7.9   No results for input(s): "LIPASE", "AMYLASE" in the last 8760 hours. No results for input(s): "AMMONIA" in the last 8760 hours. CBC: Recent Labs    04/03/22 1213 04/17/22 1036  05/28/22 1154  WBC 8.5 3.8 5.6  NEUTROABS 7,183 2,462 4,295  HGB 10.8* 10.4* 11.1*  HCT 32.1* 30.9* 33.2*  MCV 90.4 91.7 89.2  PLT 360 326 338   Lipid Panel: No results for input(s): "CHOL", "HDL", "LDLCALC", "TRIG", "CHOLHDL", "LDLDIRECT" in the last 8760 hours. Lab Results  Component Value Date   HGBA1C 6.3 (H) 03/12/2022    Procedures since last visit: No results found.  Assessment/Plan  1. Acute pharyngitis, unspecified etiology Productive cough - azithromycin (ZITHROMAX) 250 MG tablet; Take 2 tablets on day 1, then 1 tablet daily on days 2 through 5  Dispense: 6 tablet; Refill: 0 - dextromethorphan-guaiFENesin (MUCINEX DM) 30-600 MG 12hr tablet; Take 1 tablet by mouth 2 (two) times daily for 14 days.  Dispense: 28 tablet; Refill: 0  - POC COVID-19 negative - POC Influenza A/B negative - POCT rapid strep A negative  3. Seasonal allergies -  continue Claritin     Labs/tests ordered:  - POC COVID-19 - POC Influenza A/B - POCT rapid strep A   Next appt:  Visit date not found

## 2023-01-19 NOTE — Patient Instructions (Signed)
Faringitis Pharyngitis  La faringitis es un dolor de garganta (faringe). Se produce cuando la garganta presenta enrojecimiento, dolor e hinchazn. La mayora de las veces, esta afeccin mejora por s sola. En algunos casos, podra requerir la administracin de medicamentos. Cules son las causas? Infeccin por un virus. Infeccin por bacterias. Alergias. Qu incrementa el riesgo? Tener entre 5 y [address]. Estar en ambientes con mucha gente. Estos incluyen: Guarderas infantiles. Escuelas. Residencias estudiantiles. Vivir en un lugar con temperaturas fras al [employer]. Tener debilitado el sistema que combate las enfermedades (inmunitario). Cules son los signos o sntomas? Los sntomas pueden variar segn la causa. Los sntomas frecuentes son: Dolor de Advertising copywriter. Cansancio (fatiga). Fiebre no muy alta. Congestin nasal. Tos. Dolor de [place]. Otros sntomas pueden incluir lo siguiente: Ganglios en el cuello (ganglios linfticos) que estn hinchados. Erupciones cutneas. Pelcula en la garganta o amgdalas. Esto tambin puede ser causado por una infeccin bacteriana. Vmitos. Enrojecimiento y [employer]. Prdida del apetito. Dolores musculares y en las articulaciones. Amgdalas que estn temporalmente ms grandes de lo habitual (agrandadas). Cmo se trata? Muchas veces el tratamiento no es necesario. Generalmente, esta afeccin mejora en el trmino de 3 o 4 das sin [place]. Si la infeccin es causada por bacterias, es posible que deba tomar antibiticos. Siga estas instrucciones en su casa: Medicamentos Use los medicamentos de venta libre y los recetados solamente como se lo haya indicado el mdico. Si le recetaron un antibitico, tmelo como se lo haya indicado el mdico. No deje de tomar el antibitico, aunque comience a Actor. Use pastillas o aerosoles para Engineer, materials de garganta como se lo indique el mdico. Los nios pueden contraer  faringitis. No le d aspirina al nio. Control del dolor Para ayudar a Engineer, materials, intente lo siguiente: Neal Dy a sorbos lquidos calientes, por ejemplo: Caldos. T de hierbas. Agua tibia. Tambin puede comer o beber lquidos fros o congelados, tales como paletas de hielo congelado. Enjuagarse la boca Arts administrator) con Burlene Arnt de agua con sal 3 o 4 veces al da, o cuando sea necesario. Para preparar agua con sal, disuelva de  a 1 cucharadita (de 3 a 6 g) de sal en 1 taza (237 ml) de agua tibia. No trague esta mezcla. Chupe caramelos duros o pastillas para la garganta. Ponga un humidificador de vapor fro en la habitacin por la noche para humedecer el aire. Tambin puede abrir el agua caliente de la ducha y sentarse en el bao con la puerta cerrada durante 5 a 10 minutos.  Instrucciones generales  No fume ni consuma ningn producto que contenga nicotina o tabaco. Si necesita ayuda para dejar de fumar, consulte al mdico. Haga reposo como se lo haya indicado el mdico. Beba suficiente lquido para mantener el pis (la orina) de color amarillo plido. Cmo se evita? Lvese las manos frecuentemente con agua y jabn durante al menos 20 segundos. Use desinfectante para manos si no dispone de [place] y [place]. No se toque los ojos, la nariz o la boca sin antes [employer]. Lvese las manos despus de tocar estas zonas. No comparta vasos ni utensilios para comer. Evite el contacto cercano con personas que estn enfermas. Comunquese con un mdico si: Tiene bultos grandes y dolorosos en el cuello. Tiene una erupcin cutnea. Cuando tose elimina una expectoracin verde, amarilla amarronada o con [place]. Solicite ayuda de inmediato si: Tiene rigidez en el cuello. Babea o no puede tragar lquidos. No puede beber ni tomar medicamentos  sin vomitar. Siente un dolor intenso que no se alivia con medicamentos. Tiene problemas para respirar que no se deben a la congestin nasal. Tiene  dolor e hinchazn en las rodillas, los tobillos, las [place] o los codos que antes no tena. Estos sntomas pueden Customer service manager. Solicite ayuda de inmediato. Comunquese con el servicio de emergencias de su localidad (911 en los [place]). No espere a ver si los sntomas desaparecen. No conduzca por sus propios medios [employer]. Resumen La faringitis es un dolor de garganta (faringe). Se produce cuando la garganta presenta enrojecimiento, dolor e hinchazn. La mayora de las veces, la faringitis mejora por s sola. A veces, puede requerir la administracin de medicamentos. Si le recetaron un antibitico, tmelo como se lo haya indicado el mdico. No deje de tomar el antibitico, aunque comience a sentirse mejor. Esta informacin no tiene Theme park manager el consejo del mdico. Asegrese de hacerle al mdico cualquier pregunta que tenga. Document Revised: 11/10/2020 Document Reviewed: 11/10/2020 Elsevier Patient Education  2024 [employer].

## 2023-03-04 ENCOUNTER — Encounter: Payer: Self-pay | Admitting: Family

## 2023-03-04 ENCOUNTER — Ambulatory Visit (INDEPENDENT_AMBULATORY_CARE_PROVIDER_SITE_OTHER): Payer: 59 | Admitting: Family

## 2023-03-04 VITALS — BP 132/80 | HR 91 | Temp 97.3°F | Ht 59.25 in | Wt 159.6 lb

## 2023-03-04 DIAGNOSIS — M542 Cervicalgia: Secondary | ICD-10-CM

## 2023-03-04 MED ORDER — IBUPROFEN 600 MG PO TABS
600.0000 mg | ORAL_TABLET | Freq: Three times a day (TID) | ORAL | 0 refills | Status: DC | PRN
Start: 2023-03-04 — End: 2023-12-10

## 2023-03-04 MED ORDER — CYCLOBENZAPRINE HCL 5 MG PO TABS
5.0000 mg | ORAL_TABLET | Freq: Three times a day (TID) | ORAL | 1 refills | Status: DC | PRN
Start: 2023-03-04 — End: 2023-12-10

## 2023-03-04 NOTE — Patient Instructions (Addendum)
-   Please get Neck X-ray at [facility] imaging at [address] [facility] then will call you with results.  - Apply heating pad to neck/shoulder area daily for 15- 20 minutes

## 2023-03-04 NOTE — Progress Notes (Signed)
Provider:   FNP-C  Frederica Kuster, MD  Patient Care Team: Frederica Kuster, MD as PCP - General [facility] Medicine)  Extended Emergency Contact Information Primary Emergency Contact: Hannig,Serafin Address: [address]          [place], [place] [address] D[place] of [place] Home Phone: [phone] Mobile Phone: [phone] Relation: Spouse Secondary Emergency Contact: Decoste,Steven Home Phone: [phone] Mobile Phone: [phone] Relation: Son  Code Status:  Full Code  Goals of care: Advanced Directive information    01/19/2023   10:07 AM  Advanced Directives  Does Patient Have a Medical Advance Directive? No  Would patient like information on creating a medical advance directive? No - Patient declined     Chief Complaint  Patient presents with   Acute Visit    Patient complains of pain in neck since last week. Last Thursday patient with redness left eye that has now resolved. Discuss metoprolol, patient is taking differently than medication list and recently restarted as she forgot to take it.     HPI:  Pt is a 62 y.o. female seen today for an acute visit for evaluation of  pain in neck x 1 week.thinks she is stress out.whenever she turns feels like Popping and painful.Rates pain 8-9 on scale.Pain does not radiate any where else. No previous surgeries or injuries.she denies any weakness    Last Thursday patient had redness left eye that has now resolved. Has been itching and had yellow discharge.   Discuss metoprolol, patient is taking differently than medication list and recently restarted as she forgot to take it.    Past Medical History:  Diagnosis Date   Abnormal Pap smear    Anemia    Cervical cancer [facility]) oncologist- dr Andrey Farmer   Stage IB1  SCCa    Dyspnea    Fatigue    History of radiation therapy 11/01/2018   IMRT 09/27/2018-11/01/2018 to Pelvis, Vagina / 45 Gy in 25 fractions  Dr Antony Blackbird   History of radiation therapy  12/01/2018   Vaginal Cuff (area of recurrence) / 24 Gy in 4 fractions 11/10/2018-12/01/2018  Dr Antony Blackbird   Hyperlipidemia    Left breast mass 2013   7x4x4 mm 6o'clock   PONV (postoperative nausea and vomiting)    Pre-diabetes    Sepsis (HCC) 03/2018   Urosepsis, Resolved   Thyroid nodule 03/25/2018   inferior right thyroid, noted on US thyroid   Ureterovaginal fistula    Urgency of urination    intermittant   Urinary frequency    Past Surgical History:  Procedure Laterality Date   ABDOMINAL HYSTERECTOMY     BREAST BIOPSY Left 06/15/2012   CYSTOSCOPY W/ RETROGRADES Bilateral 02/16/2018   Procedure: CYSTOSCOPY WITH RETROGRADE PYELOGRAM BILATERAL DIAGNOSTIC URETEROSCOPY, BILATERAL  STENT PLACEMENT;  Surgeon: Crist Fat, MD;  Location: [facility];  Service: Urology;  Laterality: Bilateral;   CYSTOSCOPY W/ URETERAL STENT PLACEMENT Bilateral 04/22/2018   Procedure: CYSTOSCOPY WITH BILATERAL RETROGRADE PYELOGRAM/ AND URETERAL STENT EXCHANGE, vaginal exam;  Surgeon: Crist Fat, MD;  Location: [facility];  Service: Urology;  Laterality: Bilateral;   CYSTOSCOPY W/ URETERAL STENT PLACEMENT Bilateral 05/28/2018   Procedure: CYSTOSCOPY WITH BILATERAL  RETROGRADE PYELOGRAM/URETERAL STENT REPLACEMENT, REMOVAL OF BILATERAL PERCUTANEOUS DRAINS;  Surgeon: Crist Fat, MD;  Location: [facility];  Service: Urology;  Laterality: Bilateral;   D & C LEEP CONIZATION BX  07-31-2003   dr p. rose [facility]   IR IMAGING GUIDED PORT  INSERTION  09/27/2018   IR NEPHROSTOGRAM RIGHT THRU EXISTING ACCESS  03/18/2018   IR NEPHROSTOMY EXCHANGE LEFT  04/27/2018   IR NEPHROSTOMY EXCHANGE RIGHT  04/27/2018   IR NEPHROSTOMY PLACEMENT LEFT  03/18/2018   IR NEPHROSTOMY PLACEMENT RIGHT  03/02/2018   IR REMOVAL TUN ACCESS W/ PORT W/O FL MOD SED  03/17/2019   PELVIC LYMPH NODE DISSECTION Bilateral 02/08/2018   Procedure: BILATEAL PEVIC  LYMPHADENECTOMY;  Surgeon: Adolphus Birchwood, MD;   Location: [facility];  Service: Gynecology;  Laterality: Bilateral;   ROBOTIC ASSISTED TOTAL HYSTERECTOMY WITH BILATERAL SALPINGO OOPHERECTOMY N/A 02/08/2018   Procedure: XI ROBOTIC ASSISTED TOTAL Radical HYSTERECTOMY WITH BILATERAL SALPINGO OOPHORECTOMY;  Surgeon: Adolphus Birchwood, MD;  Location: [facility];  Service: Gynecology;  Laterality: N/A;   Transvaginal tape placement  03-12-2009  dr Scheryl Darter  [facility]   GYNECARE TENSION-FREE VAGINAL TAPE SLING   TUBAL LIGATION  05-28-2005  DR  MARSHALL  @ [facility]   PPTL    No Known Allergies  Outpatient Encounter Medications as of 03/04/2023  Medication Sig   Ascorbic Acid (VITAMIN C) 500 MG CHEW Chew 1 tablet (500 mg total) by mouth daily.   calcitRIOL (ROCALTROL) 0.25 MCG capsule Take 1 capsule (0.25 mcg total) by mouth 3 (three) times daily.   calcium-vitamin D (OSCAL WITH D) 500-200 MG-UNIT TABS tablet Take by mouth.   Iron, Ferrous Sulfate, 325 (65 Fe) MG TABS Take 325 mg by mouth daily.   levothyroxine (EUTHYROX) 100 MCG tablet Take 1 tablet (100 mcg total) by mouth daily before breakfast.   metoprolol tartrate (LOPRESSOR) 25 MG tablet Take 0.5 tablets (12.5 mg total) by mouth 2 (two) times daily as needed (take when HR is greater than 100).   [DISCONTINUED] loratadine (CLARITIN) 10 MG tablet Take 1 tablet (10 mg total) by mouth daily. (Patient not taking: Reported on 03/04/2023)   [DISCONTINUED] saccharomyces boulardii (FLORASTOR) 250 MG capsule Take 1 capsule (250 mg total) by mouth 2 (two) times daily.   No facility-administered encounter medications on file as of 03/04/2023.    Review of Systems  Constitutional:  Negative for appetite change, chills, fatigue, fever and unexpected weight change.  HENT:  Negative for congestion, dental problem, ear discharge, ear pain, facial swelling, hearing loss, nosebleeds, postnasal drip, rhinorrhea, sinus pressure, sinus pain, sneezing, sore throat and trouble swallowing.   Eyes:  Negative for pain, discharge, redness,  itching and visual disturbance.       Had redness and itching of the left eye about a week ago.No redness today  Respiratory:  Negative for cough, chest tightness, shortness of breath and wheezing.   Cardiovascular:  Negative for chest pain, palpitations and leg swelling.  Musculoskeletal:  Positive for arthralgias and neck pain. Negative for back pain, gait problem, joint swelling, myalgias and neck stiffness.  Skin:  Negative for color change, pallor and rash.  Neurological:  Negative for dizziness, syncope, speech difficulty, weakness, light-headedness, numbness and headaches.  Psychiatric/Behavioral:  Negative for agitation, behavioral problems, confusion, hallucinations and sleep disturbance. The patient is not nervous/anxious.        Increased stress level     Immunization History  Administered Date(s) Administered   Influenza Split 07/11/2011   Influenza, High Dose Seasonal PF 05/26/2022   Influenza,inj,Quad PF,6+ Mos 04/20/2019   Influenza,inj,Quad PF,6-35 Mos 04/20/2019   Influenza-Unspecified 07/11/2011   PFIZER(Purple Top)SARS-COV-2 Vaccination 12/06/2019, 12/27/2019   Pneumococcal Polysaccharide-23 04/28/2021   Tdap 02/23/2018, 06/16/2021   Pertinent  Health Maintenance Due  Topic Date Due  FOOT EXAM  Never done   OPHTHALMOLOGY EXAM  Never done   MAMMOGRAM  12/23/2019   HEMOGLOBIN A1C  09/12/2022   INFLUENZA VACCINE  03/05/2023   PAP SMEAR-Modifier  12/24/2025      11/27/2021   10:58 AM 03/12/2022    9:25 AM 04/03/2022   11:00 AM 05/05/2022   11:05 AM 05/28/2022   11:05 AM  Fall Risk  Falls in the past year? 0 0 0  0  Was there an injury with Fall? 0 0 0  0  Fall Risk Category Calculator 0 0 0  0  Fall Risk Category (Retired) Low Low Low  Low  (RETIRED) Patient Fall Risk Level Low fall risk Low fall risk Low fall risk Low fall risk Low fall risk  Patient at Risk for Falls Due to No Fall Risks No Fall Risks No Fall Risks  No Fall Risks  Fall risk Follow up Falls  evaluation completed  Falls evaluation completed  Falls evaluation completed   Functional Status Survey:    Vitals:   03/04/23 1252  BP: 132/80  Pulse: 91  Temp: (!) 97.3 F (36.3 C)  TempSrc: Temporal  SpO2: 97%  Weight: 159 lb 9.6 oz (72.4 kg)  Height: 4' 11.25" (1.505 m)   Body mass index is 31.96 kg/m. Physical Exam Vitals reviewed.  Constitutional:      General: She is not in acute distress.    Appearance: Normal appearance. She is obese. She is not ill-appearing or diaphoretic.  HENT:     Head: Normocephalic.     Right Ear: Tympanic membrane, ear canal and external ear normal. There is no impacted cerumen.     Left Ear: Tympanic membrane, ear canal and external ear normal. There is no impacted cerumen.     Nose: Nose normal. No congestion or rhinorrhea.     Mouth/Throat:     Mouth: Mucous membranes are moist.     Pharynx: Oropharynx is clear. No oropharyngeal exudate or posterior oropharyngeal erythema.  Eyes:     General: No scleral icterus.       Right eye: No discharge.        Left eye: No discharge.     Extraocular Movements: Extraocular movements intact.     Conjunctiva/sclera: Conjunctivae normal.     Pupils: Pupils are equal, round, and reactive to light.  Neck:     Vascular: No carotid bruit.     Comments: Muscle tightness over scapula.Pain with rotation on neck to right and left  Cardiovascular:     Rate and Rhythm: Normal rate and regular rhythm.     Pulses: Normal pulses.     Heart sounds: Normal heart sounds. No murmur heard.    No friction rub. No gallop.  Pulmonary:     Effort: Pulmonary effort is normal. No respiratory distress.     Breath sounds: Normal breath sounds. No wheezing, rhonchi or rales.  Chest:     Chest wall: No tenderness.  Abdominal:     General: There is no distension.     Palpations: There is no mass.     Tenderness: There is no abdominal tenderness. There is no right CVA tenderness, left CVA tenderness, guarding or rebound.   Musculoskeletal:        General: No swelling. Normal range of motion.     Cervical back: Normal range of motion. Tenderness present. No rigidity.     Right lower leg: No edema.     Left lower leg: No  edema.  Lymphadenopathy:     Cervical: No cervical adenopathy.  Skin:    General: Skin is warm and dry.     Coloration: Skin is not pale.     Findings: No bruising, erythema or rash.  Neurological:     Mental Status: She is alert and oriented to person, place, and time.     Cranial Nerves: No cranial nerve deficit.     Sensory: No sensory deficit.     Motor: No weakness.     Coordination: Coordination normal.     Gait: Gait normal.  Psychiatric:        Mood and Affect: Mood normal.        Speech: Speech normal.        Behavior: Behavior normal.     Labs reviewed: Recent Labs    03/12/22 1006 04/03/22 1213  NA 139 136  K 3.7 3.6  CL 100 93*  CO2 28 28  GLUCOSE 134 119  BUN 15 14  CREATININE 0.92 0.96  CALCIUM 8.1* 7.2*   Recent Labs    03/12/22 1006  AST 14  ALT 13  BILITOT 0.9  PROT 7.9   Recent Labs    04/03/22 1213 04/17/22 1036 05/28/22 1154  WBC 8.5 3.8 5.6  NEUTROABS 7,183 2,462 4,295  HGB 10.8* 10.4* 11.1*  HCT 32.1* 30.9* 33.2*  MCV 90.4 91.7 89.2  PLT 360 326 338   Lab Results  Component Value Date   TSH 0.13 (L) 04/03/2022   Lab Results  Component Value Date   HGBA1C 6.3 (H) 03/12/2022   Lab Results  Component Value Date   CHOL 181 04/17/2021   HDL 51 04/17/2021   LDLCALC 100 (H) 04/17/2021   TRIG 179 (H) 04/17/2021   CHOLHDL 3.5 04/17/2021    Significant Diagnostic Results in last 30 days:  No results found.  Assessment/Plan  Neck pain, bilateral Tender to palpation and pain with ROM  - will obtain imaging  - start on flexeril as below for muscle pain and relaxation.side effects discussed  - Ibuprofen for pain.advised to take with food.  - DG Cervical Spine Complete - cyclobenzaprine (FLEXERIL) 5 MG tablet; Take 1 tablet  (5 mg total) by mouth 3 (three) times daily as needed for muscle spasms.  Dispense: 30 tablet; Refill: 1 - ibuprofen (ADVIL) 600 MG tablet; Take 1 tablet (600 mg total) by mouth every 8 (eight) hours as needed.  Dispense: 30 tablet; Refill: 0 -Advised to get Neck X-ray at [facility] imaging at [facility] then will call you with results.  - Apply heating pad to neck/shoulder area daily for 15- 20 minutes   Family/ staff Communication: Reviewed plan of care with patient verbalized understanding  Labs/tests ordered: - DG Cervical Spine Complete  Next Appointment: Return if symptoms worsen or fail to improve.   Caesar Bookman, NP

## 2023-03-13 ENCOUNTER — Encounter: Payer: Self-pay | Admitting: Family Medicine

## 2023-03-16 ENCOUNTER — Encounter: Payer: Self-pay | Admitting: Family Medicine

## 2023-03-17 ENCOUNTER — Encounter: Payer: Self-pay | Admitting: Family Medicine

## 2023-05-06 NOTE — Progress Notes (Signed)
Radiation Oncology         [phone] ________________________________  Name: Amy Morrow MRN: [id number]  Date: 05/07/2023  DOB: 07-05-1961  Follow-Up Visit Note  CC: Ngetich, Donalee Citrin, NP  Cain Saupe, MD  No diagnosis found.  Diagnosis: Recurrent cervical cancer   Interval Since Last Radiation: 4 years, 5 months, and 4 days    Radiation treatment dates:    1. IMRT: 09/27/2018 - 11/01/2018 2. HDR: 11/10/2018, 11/18/2018, 11/24/2018, 12/01/2018   Site/dose:    1. Pelvis, Vagina / 45 Gy in 25 fractions 2. Vaginal Cuff (area of recurrence) / 24 Gy in 4 fractions  Narrative:  The patient returns today for routine annual follow-up. She was last seen here for follow-up on 05/05/22.   Since her last visit, she followed up with Dr. Pricilla Holm on 12/25/22. She did endorse some dyspareunia at that time and was offered a referral to pelvic floor physical therapy. She otherwise denied any other symptoms concerning for disease recurrence and she was noted as NED on examination. Routine Pap and HPV testing were also collected during this visit and came back negative.   Of note: Shortly after her last visit, she presented for a diagnostic right breast mammogram and right breast ultrasound on 05/10/23 which showed a likely benign mass in the 2 o'clock right breast, measuring 4 mm. She most recently presented for a follow-up mammogram and US of the right breast on 11/18/22 which showed stability of the 2 o'clock right breast mass  She also presented for a follow-up soft tissue US of the head and neck on 11/19/22 (hx of thyroid cancer) which showed no evidence of residual or locally recurrent disease s/p total thyroidectomy.    ***                          Allergies:  has No Known Allergies.  Meds: Current Outpatient Medications  Medication Sig Dispense Refill   Ascorbic Acid (VITAMIN C) 500 MG CHEW Chew 1 tablet (500 mg total) by mouth daily. 30 tablet 3   calcitRIOL (ROCALTROL) 0.25  MCG capsule Take 1 capsule (0.25 mcg total) by mouth 3 (three) times daily. 90 capsule 3   calcium-vitamin D (OSCAL WITH D) 500-200 MG-UNIT TABS tablet Take by mouth.     cyclobenzaprine (FLEXERIL) 5 MG tablet Take 1 tablet (5 mg total) by mouth 3 (three) times daily as needed for muscle spasms. 30 tablet 1   ibuprofen (ADVIL) 600 MG tablet Take 1 tablet (600 mg total) by mouth every 8 (eight) hours as needed. 30 tablet 0   Iron, Ferrous Sulfate, 325 (65 Fe) MG TABS Take 325 mg by mouth daily. 30 tablet 3   levothyroxine (EUTHYROX) 100 MCG tablet Take 1 tablet (100 mcg total) by mouth daily before breakfast. 30 tablet 3   metoprolol tartrate (LOPRESSOR) 25 MG tablet Take 0.5 tablets (12.5 mg total) by mouth 2 (two) times daily as needed (take when HR is greater than 100). 60 tablet 0   No current facility-administered medications for this encounter.    Physical Findings: The patient is in no acute distress. Patient is alert and oriented.  vitals were not taken for this visit. .  No significant changes. Lungs are clear to auscultation bilaterally. Heart has regular rate and rhythm. No palpable cervical, supraclavicular, or axillary adenopathy. Abdomen soft, non-tender, normal bowel sounds.  On pelvic examination the external genitalia were unremarkable. A speculum exam was performed. There are  no mucosal lesions noted in the vaginal vault. A Pap smear was obtained of the proximal vagina. On bimanual and rectovaginal examination there were no pelvic masses appreciated. ***   Lab Findings: Lab Results  Component Value Date   WBC 5.6 05/28/2022   HGB 11.1 (L) 05/28/2022   HCT 33.2 (L) 05/28/2022   MCV 89.2 05/28/2022   PLT 338 05/28/2022    Radiographic Findings: No results found.  Impression: Recurrent cervical cancer   The patient is recovering from the effects of radiation.  ***  Plan:  ***   *** minutes of total time was spent for this patient encounter, including preparation,  face-to-face counseling with the patient and coordination of care, physical exam, and documentation of the encounter. ____________________________________  Billie Lade, PhD, MD  This document serves as a record of services personally performed by Antony Blackbird, MD. It was created on his behalf by Neena Rhymes, a trained medical scribe. The creation of this record is based on the scribe's personal observations and the provider's statements to them. This document has been checked and approved by the attending provider.

## 2023-05-07 ENCOUNTER — Ambulatory Visit
Admission: RE | Admit: 2023-05-07 | Discharge: 2023-05-07 | Disposition: A | Discharge status: Home / Self Care | Payer: 59 | Source: Ambulatory Visit | Attending: Radiation Oncology | Admitting: Radiation Oncology

## 2023-05-07 ENCOUNTER — Encounter: Payer: Self-pay | Admitting: Radiation Oncology

## 2023-05-07 VITALS — BP 126/81 | HR 100 | Temp 97.7°F | Resp 20 | Ht 59.4 in | Wt 156.4 lb

## 2023-05-07 DIAGNOSIS — C539 Malignant neoplasm of cervix uteri, unspecified: Secondary | ICD-10-CM

## 2023-05-07 DIAGNOSIS — N941 Unspecified dyspareunia: Secondary | ICD-10-CM | POA: Insufficient documentation

## 2023-05-07 DIAGNOSIS — Z79899 Other long term (current) drug therapy: Secondary | ICD-10-CM | POA: Insufficient documentation

## 2023-05-07 DIAGNOSIS — Z923 Personal history of irradiation: Secondary | ICD-10-CM | POA: Diagnosis not present

## 2023-05-07 DIAGNOSIS — Z79890 Hormone replacement therapy (postmenopausal): Secondary | ICD-10-CM | POA: Insufficient documentation

## 2023-05-07 DIAGNOSIS — Z8541 Personal history of malignant neoplasm of cervix uteri: Secondary | ICD-10-CM | POA: Insufficient documentation

## 2023-05-07 DIAGNOSIS — Z85850 Personal history of malignant neoplasm of thyroid: Secondary | ICD-10-CM | POA: Diagnosis not present

## 2023-05-07 DIAGNOSIS — C531 Malignant neoplasm of exocervix: Secondary | ICD-10-CM

## 2023-05-07 NOTE — Progress Notes (Signed)
Amy Morrow is here today for follow up post radiation to the pelvic.  They completed their radiation on: 11/01/2018  Does the patient complain of any of the following:  Pain: No Abdominal bloating: No Diarrhea/Constipation: No Nausea/Vomiting: No Vaginal Discharge: No Blood in Urine or Stool: No Urinary Issues (dysuria/incomplete emptying/ incontinence/ increased frequency/urgency): She reports some incontinence. Does patient report using vaginal dilator 2-3 times a week and/or sexually active 2-3 weeks: She states that she uses dilators sometimes. Reinforced the importance of use. Post radiation skin changes: No  BP 126/81 (BP Location: Right Arm, Patient Position: Sitting, Cuff Size: Normal)   Pulse 100   Temp 97.7 F (36.5 C)   Resp 20   Ht 4' 11.4" (1.509 m)   Wt 156 lb 6.4 oz (70.9 kg)   LMP 11/16/2012   SpO2 97%   BMI 31.16 kg/m

## 2023-06-26 ENCOUNTER — Ambulatory Visit: Payer: 59 | Admitting: Gynecologic Oncology

## 2023-07-09 ENCOUNTER — Ambulatory Visit (INDEPENDENT_AMBULATORY_CARE_PROVIDER_SITE_OTHER): Payer: 59 | Admitting: Family

## 2023-07-09 ENCOUNTER — Encounter: Payer: Self-pay | Admitting: Family

## 2023-07-09 VITALS — BP 134/86 | HR 96 | Temp 98.2°F | Resp 20 | Ht 59.4 in | Wt 158.6 lb

## 2023-07-09 DIAGNOSIS — R000 Tachycardia, unspecified: Secondary | ICD-10-CM | POA: Diagnosis not present

## 2023-07-09 DIAGNOSIS — R300 Dysuria: Secondary | ICD-10-CM

## 2023-07-09 LAB — POCT URINALYSIS DIPSTICK
Bilirubin, UA: NEGATIVE
Glucose, UA: NEGATIVE
Ketones, UA: NEGATIVE
Nitrite, UA: NEGATIVE
Protein, UA: NEGATIVE
Spec Grav, UA: 1.01 (ref 1.010–1.025)
Urobilinogen, UA: 0.2 U/dL
pH, UA: 7.5 (ref 5.0–8.0)

## 2023-07-09 MED ORDER — CIPROFLOXACIN HCL 500 MG PO TABS: 500.0000 mg | ORAL_TABLET | Freq: Two times a day (BID) | ORAL | 0 refills | Status: AC

## 2023-07-09 MED ORDER — METOPROLOL TARTRATE 25 MG PO TABS: 12.5000 mg | ORAL_TABLET | Freq: Two times a day (BID) | ORAL | 0 refills | Status: DC

## 2023-07-09 NOTE — Progress Notes (Signed)
Provider: Richarda Blade FNP-C  Hoy Fallert, Donalee Citrin, NP  Patient Care Team: Vivion Romano, Donalee Citrin, NP as PCP - General (Family Medicine)  Extended Emergency Contact Information Primary Emergency Contact: Mattie,Serafin Address: [address]          [place], [place] [address] [place] of [place] Home Phone: [phone] Mobile Phone: [phone] Relation: Spouse Secondary Emergency Contact: Simer,Steven Home Phone: [phone] Mobile Phone: [phone] Relation: Son  Code Status:  Full Code  Goals of care: Advanced Directive information    05/07/2023   11:01 AM  Advanced Directives  Does Patient Have a Medical Advance Directive? No     Chief Complaint  Patient presents with   Acute Visit    Patient presents today for dysuria     HPI:  Pt is a 62 y.o. female seen today for an acute visit for evaluation of urine frequency and dysuria x 1 week.Voids 2-3 times at bedtime. Appetite described as good.she denies any fever,chills,nausea,vomiting,abdominal pain,flank pain,urgency,difficult urination or hematuria, Also request metoprolol 12.5 mg tablet one by mouth twice daily PRN previously prescribed by Trish Mage ,NP for sinus tachycardia in 2023.states has been having intermittent palpitation. Metoprolol effective.denies any chest pain or shortness of breath.    Past Medical History:  Diagnosis Date   Abnormal Pap smear    Anemia    [facility]ologist- dr Andrey Farmer   Stage IB1  SCCa    Dyspnea    Fatigue    History of radiation therapy 11/01/2018   IMRT 09/27/2018-11/01/2018 to Pelvis, Vagina / 45 Gy in 25 fractions  Dr Antony Blackbird   History of radiation therapy 12/01/2018   Vaginal Cuff (area of recurrence) / 24 Gy in 4 fractions 11/10/2018-12/01/2018  Dr Antony Blackbird   Hyperlipidemia    Left breast mass 2013   7x4x4 mm 6o'clock   PONV (postoperative nausea and vomiting)    Pre-diabetes    Sepsis (HCC) 03/2018   Urosepsis, Resolved   Thyroid  nodule 03/25/2018   inferior right thyroid, noted on US thyroid   Ureterovaginal fistula    Urgency of urination    intermittant   Urinary frequency    Past Surgical History:  Procedure Laterality Date   ABDOMINAL HYSTERECTOMY     BREAST BIOPSY Left 06/15/2012   CYSTOSCOPY W/ RETROGRADES Bilateral 02/16/2018   Procedure: CYSTOSCOPY WITH RETROGRADE PYELOGRAM BILATERAL DIAGNOSTIC URETEROSCOPY, BILATERAL  STENT PLACEMENT;  Surgeon: Crist Fat, MD;  Location: [facility];  Service: Urology;  Laterality: Bilateral;   CYSTOSCOPY W/ URETERAL STENT PLACEMENT Bilateral 04/22/2018   Procedure: CYSTOSCOPY WITH BILATERAL RETROGRADE PYELOGRAM/ AND URETERAL STENT EXCHANGE, vaginal exam;  Surgeon: Crist Fat, MD;  Location: [facility];  Service: Urology;  Laterality: Bilateral;   CYSTOSCOPY W/ URETERAL STENT PLACEMENT Bilateral 05/28/2018   Procedure: CYSTOSCOPY WITH BILATERAL  RETROGRADE PYELOGRAM/URETERAL STENT REPLACEMENT, REMOVAL OF BILATERAL PERCUTANEOUS DRAINS;  Surgeon: Crist Fat, MD;  Location: [facility];  Service: Urology;  Laterality: Bilateral;   D & C LEEP CONIZATION BX  07-31-2003   dr p. rose [facility]   IR IMAGING GUIDED PORT INSERTION  09/27/2018   IR NEPHROSTOGRAM RIGHT THRU EXISTING ACCESS  03/18/2018   IR NEPHROSTOMY EXCHANGE LEFT  04/27/2018   IR NEPHROSTOMY EXCHANGE RIGHT  04/27/2018   IR NEPHROSTOMY PLACEMENT LEFT  03/18/2018   IR NEPHROSTOMY PLACEMENT RIGHT  03/02/2018   IR REMOVAL TUN ACCESS W/ PORT W/O FL MOD SED  03/17/2019   PELVIC LYMPH NODE DISSECTION  Bilateral 02/08/2018   Procedure: BILATEAL PEVIC  LYMPHADENECTOMY;  Surgeon: Adolphus Birchwood, MD;  Location: [facility];  Service: Gynecology;  Laterality: Bilateral;   ROBOTIC ASSISTED TOTAL HYSTERECTOMY WITH BILATERAL SALPINGO OOPHERECTOMY N/A 02/08/2018   Procedure: XI ROBOTIC ASSISTED TOTAL Radical HYSTERECTOMY WITH BILATERAL SALPINGO OOPHORECTOMY;  Surgeon: Adolphus Birchwood, MD;  Location: [facility];   Service: Gynecology;  Laterality: N/A;   Transvaginal tape placement  03-12-2009  dr Scheryl Darter  [facility]   GYNECARE TENSION-FREE VAGINAL TAPE SLING   TUBAL LIGATION  05-28-2005  DR  MARSHALL  @ [facility]   PPTL    No Known Allergies  Outpatient Encounter Medications as of 07/09/2023  Medication Sig   Ascorbic Acid (VITAMIN C) 500 MG CHEW Chew 1 tablet (500 mg total) by mouth daily.   calcitRIOL (ROCALTROL) 0.25 MCG capsule Take 1 capsule (0.25 mcg total) by mouth 3 (three) times daily.   calcium-vitamin D (OSCAL WITH D) 500-200 MG-UNIT TABS tablet Take by mouth.   cyclobenzaprine (FLEXERIL) 5 MG tablet Take 1 tablet (5 mg total) by mouth 3 (three) times daily as needed for muscle spasms.   ibuprofen (ADVIL) 600 MG tablet Take 1 tablet (600 mg total) by mouth every 8 (eight) hours as needed.   Iron, Ferrous Sulfate, 325 (65 Fe) MG TABS Take 325 mg by mouth daily.   levothyroxine (EUTHYROX) 100 MCG tablet Take 1 tablet (100 mcg total) by mouth daily before breakfast.   metoprolol tartrate (LOPRESSOR) 25 MG tablet Take 0.5 tablets (12.5 mg total) by mouth 2 (two) times daily as needed (take when HR is greater than 100). (Patient not taking: Reported on 07/09/2023)   No facility-administered encounter medications on file as of 07/09/2023.    Review of Systems  Constitutional:  Negative for appetite change, chills, fatigue, fever and unexpected weight change.  HENT:  Negative for congestion, ear discharge, ear pain, hearing loss, postnasal drip, rhinorrhea, sinus pressure, sinus pain, sneezing, sore throat, tinnitus and trouble swallowing.   Eyes:  Negative for pain, discharge, redness, itching and visual disturbance.  Respiratory:  Negative for cough, chest tightness, shortness of breath and wheezing.   Cardiovascular:  Positive for palpitations. Negative for chest pain and leg swelling.  Gastrointestinal:  Negative for abdominal distention, abdominal pain, blood in stool, constipation, diarrhea, nausea  and vomiting.  Endocrine: Negative for cold intolerance, heat intolerance, polydipsia, polyphagia and polyuria.  Genitourinary:  Positive for dysuria and frequency. Negative for difficulty urinating, flank pain and urgency.  Musculoskeletal:  Negative for arthralgias, back pain, gait problem, joint swelling, myalgias, neck pain and neck stiffness.  Skin:  Negative for color change, pallor and rash.  Neurological:  Negative for dizziness, syncope, speech difficulty, weakness, light-headedness, numbness and headaches.  Psychiatric/Behavioral:  Negative for agitation, behavioral problems, confusion, hallucinations and sleep disturbance. The patient is not nervous/anxious.     Immunization History  Administered Date(s) Administered   Influenza Split 07/11/2011   Influenza, High Dose Seasonal PF 05/26/2022   Influenza,inj,Quad PF,6+ Mos 04/20/2019   Influenza,inj,Quad PF,6-35 Mos 04/20/2019   Influenza-Unspecified 07/11/2011   PFIZER(Purple Top)SARS-COV-2 Vaccination 12/06/2019, 12/27/2019   Pneumococcal Polysaccharide-23 04/28/2021   Tdap 02/23/2018, 06/16/2021   Pertinent  Health Maintenance Due  Topic Date Due   FOOT EXAM  Never done   OPHTHALMOLOGY EXAM  Never done   MAMMOGRAM  12/23/2019   HEMOGLOBIN A1C  09/12/2022   INFLUENZA VACCINE  03/05/2023      11/27/2021   10:58 AM 03/12/2022    9:25 AM 04/03/2022  11:00 AM 05/05/2022   11:05 AM 05/28/2022   11:05 AM  Fall Risk  Falls in the past year? 0 0 0  0  Was there an injury with Fall? 0 0 0  0  Fall Risk Category Calculator 0 0 0  0  Fall Risk Category (Retired) Low Low Low  Low  (RETIRED) Patient Fall Risk Level Low fall risk Low fall risk Low fall risk Low fall risk Low fall risk  Patient at Risk for Falls Due to No Fall Risks No Fall Risks No Fall Risks  No Fall Risks  Fall risk Follow up Falls evaluation completed  Falls evaluation completed  Falls evaluation completed   Functional Status Survey:    Vitals:   07/09/23  1055  BP: 134/86  Pulse: 96  Resp: 20  Temp: 98.2 F (36.8 C)  SpO2: 98%  Weight: 158 lb 9.6 oz (71.9 kg)  Height: 4' 11.4" (1.509 m)   Body mass index is 31.6 kg/m. Physical Exam Vitals reviewed.  Constitutional:      General: She is not in acute distress.    Appearance: Normal appearance. She is normal weight. She is not ill-appearing or diaphoretic.  HENT:     Head: Normocephalic.     Right Ear: Tympanic membrane, ear canal and external ear normal. There is no impacted cerumen.     Left Ear: Tympanic membrane, ear canal and external ear normal. There is no impacted cerumen.     Nose: Nose normal. No congestion or rhinorrhea.     Mouth/Throat:     Mouth: Mucous membranes are moist.     Pharynx: Oropharynx is clear. No oropharyngeal exudate or posterior oropharyngeal erythema.  Eyes:     General: No scleral icterus.       Right eye: No discharge.        Left eye: No discharge.     Extraocular Movements: Extraocular movements intact.     Conjunctiva/sclera: Conjunctivae normal.     Pupils: Pupils are equal, round, and reactive to light.  Neck:     Vascular: No carotid bruit.  Cardiovascular:     Rate and Rhythm: Normal rate and regular rhythm.     Pulses: Normal pulses.     Heart sounds: Normal heart sounds. No murmur heard.    No friction rub. No gallop.  Pulmonary:     Effort: Pulmonary effort is normal. No respiratory distress.     Breath sounds: Normal breath sounds. No wheezing, rhonchi or rales.  Chest:     Chest wall: No tenderness.  Abdominal:     General: Bowel sounds are normal. There is no distension.     Palpations: Abdomen is soft. There is no mass.     Tenderness: There is abdominal tenderness in the suprapubic area and left lower quadrant. There is no right CVA tenderness, left CVA tenderness, guarding or rebound.  Musculoskeletal:        General: No swelling or tenderness. Normal range of motion.     Cervical back: Normal range of motion. No rigidity  or tenderness.     Right lower leg: No edema.     Left lower leg: No edema.  Lymphadenopathy:     Cervical: No cervical adenopathy.  Skin:    General: Skin is warm and dry.     Coloration: Skin is not pale.     Findings: No bruising, erythema, lesion or rash.  Neurological:     Mental Status: She is alert and oriented  to person, place, and time.     Cranial Nerves: No cranial nerve deficit.     Sensory: No sensory deficit.     Motor: No weakness.     Coordination: Coordination normal.     Gait: Gait normal.  Psychiatric:        Mood and Affect: Mood normal.        Speech: Speech normal.        Behavior: Behavior normal.        Thought Content: Thought content normal.        Judgment: Judgment normal.     Labs reviewed: No results for input(s): "NA", "K", "CL", "CO2", "GLUCOSE", "BUN", "CREATININE", "CALCIUM", "MG", "PHOS" in the last 8760 hours. No results for input(s): "AST", "ALT", "ALKPHOS", "BILITOT", "PROT", "ALBUMIN" in the last 8760 hours. No results for input(s): "WBC", "NEUTROABS", "HGB", "HCT", "MCV", "PLT" in the last 8760 hours. Lab Results  Component Value Date   TSH 0.13 (L) 04/03/2022   Lab Results  Component Value Date   HGBA1C 6.3 (H) 03/12/2022   Lab Results  Component Value Date   CHOL 181 04/17/2021   HDL 51 04/17/2021   LDLCALC 100 (H) 04/17/2021   TRIG 179 (H) 04/17/2021   CHOLHDL 3.5 04/17/2021    Significant Diagnostic Results in last 30 days:  No results found.  Assessment/Plan 1. Dysuria Afebrile  - POCT urinalysis dipstick - Urine Culture - ciprofloxacin (CIPRO) 500 MG tablet; Take 1 tablet (500 mg total) by mouth 2 (two) times daily for 7 days.  Dispense: 14 tablet; Refill: 0  2. Tachycardia Reports palpitation was previously treated with  metoprolol 12.5 mg tablet one by mouth twice daily PRN previously prescribed by Trish Mage ,NP for sinus tachycardia in 2023.Heart regular this visit but request metoprolol PRN.discussed with  patient referral to Cardiology to evaluation intermittent Tachycardia.  Latest TSH 3.499; T 4 1.2 ( 05/13/2023 )  - Ambulatory referral to Cardiology - metoprolol tartrate (LOPRESSOR) 25 MG tablet; Take 0.5 tablets (12.5 mg total) by mouth 2 (two) times daily.  Dispense: 60 tablet; Refill: 0    Family/ staff Communication: Reviewed plan of care with patient verbalized understanding   Labs/tests ordered: - POCT urinalysis dipstick - Urine Culture  Next Appointment:Return if symptoms worsen or fail to improve.    Caesar Bookman, NP

## 2023-07-11 LAB — URINE CULTURE
MICRO NUMBER:: 15815024
SPECIMEN QUALITY:: ADEQUATE

## 2023-10-08 ENCOUNTER — Ambulatory Visit: Payer: 59 | Attending: Cardiology | Admitting: Cardiology

## 2023-10-08 ENCOUNTER — Encounter: Payer: Self-pay | Admitting: Cardiology

## 2023-10-08 VITALS — BP 128/72 | HR 93 | Resp 16 | Ht 59.0 in | Wt 151.0 lb

## 2023-10-08 DIAGNOSIS — R002 Palpitations: Secondary | ICD-10-CM | POA: Diagnosis not present

## 2023-10-08 DIAGNOSIS — I10 Essential (primary) hypertension: Secondary | ICD-10-CM

## 2023-10-08 DIAGNOSIS — R7303 Prediabetes: Secondary | ICD-10-CM

## 2023-10-08 DIAGNOSIS — R000 Tachycardia, unspecified: Secondary | ICD-10-CM | POA: Diagnosis not present

## 2023-10-08 DIAGNOSIS — E039 Hypothyroidism, unspecified: Secondary | ICD-10-CM

## 2023-10-08 NOTE — Progress Notes (Signed)
 Cardiology Office Note:    NAME:  Amy Morrow    MRN: [id number] DOB:  [birth year]   PCP:  Caesar Bookman, NP  Former Cardiology Providers: NA Primary Cardiologist:  Tessa Lerner, DO, [facility] (established care 10/08/2023) Electrophysiologist:  None   Referring MD: Caesar Bookman, NP  Reason of Consult: Tachycardia  Chief Complaint  Patient presents with   Tachycardia   Follow-up    History of Present Illness:    Amy Morrow is a 63 y.o. Hispanic female whose past medical history and cardiovascular risk factors includes: Hypothyroidism, Hypertension, hx of UTI, prediabetes, hx of uterus cancer.  She is being seen today for the evaluation of tachycardia at the request of Ngetich, Dinah C, NP.  Patient predominately speaks Spanish and is here with interpreter.  Patient is referred to practice for evaluation of tachycardia.  Palpitation:  Dec 2024 Occurs: Monthly  Duration: minutes  Worse: w/ stressful situation.  No improving factors.  Self-limited No change in frequency, duration, intensity.  No syncope or near syncope.  Has been on Lopressor since December. She states lopressor was given to her for HTN and not her palpitations.   Factors to consider: History of thyroid disease: has had prior thyroid surgery. Currently on synthroid.  History of anemia: none Coffee consumption: 1 cup per day Caffeinated beverages/sodas: limited Energy drinks: none Alcohol use: none Stimulants: none Illicit drugs: none Weight loss supplements: none New over-the-counter medications: none Herbal supplements: none Any prior workup: none  No family history of premature coronary disease or sudden cardiac death.  No structured exercise program or daily routine.  Current Medications: Current Meds  Medication Sig   Ascorbic Acid (VITAMIN C) 500 MG CHEW Chew 1 tablet (500 mg total) by mouth daily.   calcitRIOL (ROCALTROL) 0.25 MCG capsule Take 1 capsule (0.25 mcg total) by  mouth 3 (three) times daily.   calcium-vitamin D (OSCAL WITH D) 500-200 MG-UNIT TABS tablet Take by mouth.   cyclobenzaprine (FLEXERIL) 5 MG tablet Take 1 tablet (5 mg total) by mouth 3 (three) times daily as needed for muscle spasms.   ibuprofen (ADVIL) 600 MG tablet Take 1 tablet (600 mg total) by mouth every 8 (eight) hours as needed.   Iron, Ferrous Sulfate, 325 (65 Fe) MG TABS Take 325 mg by mouth daily.   levothyroxine (EUTHYROX) 100 MCG tablet Take 1 tablet (100 mcg total) by mouth daily before breakfast.   metoprolol tartrate (LOPRESSOR) 25 MG tablet Take 0.5 tablets (12.5 mg total) by mouth 2 (two) times daily.     Allergies:    Patient has no known allergies.   Past Medical History: Past Medical History:  Diagnosis Date   Abnormal Pap smear    Anemia    Cervical cancer [facility]) oncologist- dr Andrey Farmer   Stage IB1  SCCa    Dyspnea    Fatigue    History of radiation therapy 11/01/2018   IMRT 09/27/2018-11/01/2018 to Pelvis, Vagina / 45 Gy in 25 fractions  Dr Antony Blackbird   History of radiation therapy 12/01/2018   Vaginal Cuff (area of recurrence) / 24 Gy in 4 fractions 11/10/2018-12/01/2018  Dr Antony Blackbird   Hyperlipidemia    Left breast mass 2013   7x4x4 mm 6o'clock   PONV (postoperative nausea and vomiting)    Pre-diabetes    Sepsis (HCC) 03/2018   Urosepsis, Resolved   Thyroid nodule 03/25/2018   inferior right thyroid, noted on US thyroid   Ureterovaginal fistula  Urgency of urination    intermittant   Urinary frequency     Past Surgical History: Past Surgical History:  Procedure Laterality Date   ABDOMINAL HYSTERECTOMY     BREAST BIOPSY Left 06/15/2012   CYSTOSCOPY W/ RETROGRADES Bilateral 02/16/2018   Procedure: CYSTOSCOPY WITH RETROGRADE PYELOGRAM BILATERAL DIAGNOSTIC URETEROSCOPY, BILATERAL  STENT PLACEMENT;  Surgeon: Crist Fat, MD;  Location: [facility];  Service: Urology;  Laterality: Bilateral;   CYSTOSCOPY W/ URETERAL STENT PLACEMENT Bilateral  04/22/2018   Procedure: CYSTOSCOPY WITH BILATERAL RETROGRADE PYELOGRAM/ AND URETERAL STENT EXCHANGE, vaginal exam;  Surgeon: Crist Fat, MD;  Location: [facility];  Service: Urology;  Laterality: Bilateral;   CYSTOSCOPY W/ URETERAL STENT PLACEMENT Bilateral 05/28/2018   Procedure: CYSTOSCOPY WITH BILATERAL  RETROGRADE PYELOGRAM/URETERAL STENT REPLACEMENT, REMOVAL OF BILATERAL PERCUTANEOUS DRAINS;  Surgeon: Crist Fat, MD;  Location: [facility];  Service: Urology;  Laterality: Bilateral;   D & C LEEP CONIZATION BX  07-31-2003   dr p. rose [facility]   IR IMAGING GUIDED PORT INSERTION  09/27/2018   IR NEPHROSTOGRAM RIGHT THRU EXISTING ACCESS  03/18/2018   IR NEPHROSTOMY EXCHANGE LEFT  04/27/2018   IR NEPHROSTOMY EXCHANGE RIGHT  04/27/2018   IR NEPHROSTOMY PLACEMENT LEFT  03/18/2018   IR NEPHROSTOMY PLACEMENT RIGHT  03/02/2018   IR REMOVAL TUN ACCESS W/ PORT W/O FL MOD SED  03/17/2019   PELVIC LYMPH NODE DISSECTION Bilateral 02/08/2018   Procedure: BILATEAL PEVIC  LYMPHADENECTOMY;  Surgeon: Adolphus Birchwood, MD;  Location: [facility];  Service: Gynecology;  Laterality: Bilateral;   ROBOTIC ASSISTED TOTAL HYSTERECTOMY WITH BILATERAL SALPINGO OOPHERECTOMY N/A 02/08/2018   Procedure: XI ROBOTIC ASSISTED TOTAL Radical HYSTERECTOMY WITH BILATERAL SALPINGO OOPHORECTOMY;  Surgeon: Adolphus Birchwood, MD;  Location: [facility];  Service: Gynecology;  Laterality: N/A;   Transvaginal tape placement  03-12-2009  dr Scheryl Darter  [facility]   GYNECARE TENSION-FREE VAGINAL TAPE SLING   TUBAL LIGATION  05-28-2005  DR  MARSHALL  @ [facility]   PPTL    Social History: Social History   Tobacco Use   Smoking status: Never   Smokeless tobacco: Never  Vaping Use   Vaping status: Never Used  Substance Use Topics   Alcohol use: Never   Drug use: Never    Family History: Family History  Problem Relation Age of Onset   Hypertension Mother    Heart disease Mother    Hypertension Brother    Heart disease  Brother     ROS:   Review of Systems  Cardiovascular:  Positive for palpitations. Negative for chest pain, claudication, irregular heartbeat, leg swelling, near-syncope, orthopnea, paroxysmal nocturnal dyspnea and syncope.  Respiratory:  Negative for shortness of breath.   Hematologic/Lymphatic: Negative for bleeding problem.    EKGs/Labs/Other Studies Reviewed:   EKG: EKG Interpretation Date/Time:  Thursday October 08 2023 11:06:27 EST Ventricular Rate:  86 PR Interval:  138 QRS Duration:  72 QT Interval:  370 QTC Calculation: 442 R Axis:   -6  Text Interpretation: Normal sinus rhythm Normal ECG When compared with ECG of 26-Jun-2018 16:18, Nonspecific T wave abnormality no longer evident in Inferior leads Confirmed by Tessa Lerner [phone]) on 10/08/2023 11:16:32 AM  Labs:    Latest Ref Rng & Units 05/28/2022   11:54 AM 04/17/2022   10:36 AM 04/03/2022   12:13 PM  CBC  WBC 3.8 - 10.8 Thousand/uL 5.6  3.8  8.5   Hemoglobin 11.7 - 15.5 g/dL 56.2  13.0  86.5  Hematocrit 35.0 - 45.0 % 33.2  30.9  32.1   Platelets 140 - 400 Thousand/uL 338  326  360        Latest Ref Rng & Units 04/03/2022   12:13 PM 03/12/2022   10:06 AM 11/04/2021    2:04 PM  BMP  Glucose 65 - 139 mg/dL 829  562  130   BUN 7 - 25 mg/dL 14  15  23    Creatinine 0.50 - 1.03 mg/dL 8.65  7.84  6.96   BUN/Creat Ratio 6 - 22 (calc) SEE NOTE:  SEE NOTE:    Sodium 135 - 146 mmol/L 136  139  141   Potassium 3.5 - 5.3 mmol/L 3.6  3.7  3.6   Chloride 98 - 110 mmol/L 93  100  105   CO2 20 - 32 mmol/L 28  28  27    Calcium 8.6 - 10.4 mg/dL 7.2  8.1  8.1       Latest Ref Rng & Units 04/03/2022   12:13 PM 03/12/2022   10:06 AM 11/04/2021    2:04 PM  CMP  Glucose 65 - 139 mg/dL 295  284  132   BUN 7 - 25 mg/dL 14  15  23    Creatinine 0.50 - 1.03 mg/dL 4.40  1.02  7.25   Sodium 135 - 146 mmol/L 136  139  141   Potassium 3.5 - 5.3 mmol/L 3.6  3.7  3.6   Chloride 98 - 110 mmol/L 93  100  105   CO2 20 - 32 mmol/L 28  28  27     Calcium 8.6 - 10.4 mg/dL 7.2  8.1  8.1   Total Protein 6.1 - 8.1 g/dL  7.9    Total Bilirubin 0.2 - 1.2 mg/dL  0.9    AST 10 - 35 U/L  14    ALT 6 - 29 U/L  13      Lab Results  Component Value Date   CHOL 181 04/17/2021   HDL 51 04/17/2021   LDLCALC 100 (H) 04/17/2021   TRIG 179 (H) 04/17/2021   CHOLHDL 3.5 04/17/2021   No results for input(s): "LIPOA" in the last 8760 hours. No components found for: "NTPROBNP" No results for input(s): "PROBNP" in the last 8760 hours. Recent Labs    10/08/23 1154  TSH 1.680    Physical Exam:    Today's Vitals   10/08/23 1102  BP: 128/72  Pulse: 93  Resp: 16  SpO2: 97%  Weight: 151 lb (68.5 kg)  Height: 4\' 11"  (1.499 m)   Body mass index is 30.5 kg/m. Wt Readings from Last 3 Encounters:  10/08/23 151 lb (68.5 kg)  07/09/23 158 lb 9.6 oz (71.9 kg)  05/07/23 156 lb 6.4 oz (70.9 kg)    Physical Exam  Constitutional: No distress.  hemodynamically stable  Neck: No JVD present.  Cardiovascular: Normal rate, regular rhythm, S1 normal and S2 normal. Exam reveals no gallop, no S3 and no S4.  No murmur heard. Pulmonary/Chest: Effort normal and breath sounds normal. No stridor. She has no wheezes. She has no rales.  Musculoskeletal:        General: No edema.     Cervical back: Neck supple.  Skin: Skin is warm.    Impression & Recommendation(s):  Impression:   ICD-10-CM   1. Tachycardia  R00.0 EKG 12-Lead    TSH    TSH    2. Palpitations  R00.2 TSH    TSH  3. Benign hypertension  I10     4. Hypothyroidism, unspecified type  E03.9     5. Prediabetes  R73.03        Recommendation(s):  Tachycardia Palpitations Currently occurring infrequently, monthly basis. No identifiable reversible cause. Currently on Lopressor 12.5 mg p.o. twice daily. Check TSH. Monitor for now.  If symptoms increase in intensity frequency or duration he would be quite reasonable to order monitor at that time.  Since her symptoms currently are  quite infrequent overall diagnostic yield for a Zio patch would be low.  Patient is agreeable with the plan of care.  She will call us back if her symptoms worsen.  Benign hypertension Office blood pressures are at goal. Continue Lopressor 12.5 mg p.o. twice daily. Reemphasized importance of low-salt diet.  Hypothyroidism, unspecified type Will check TSH as discussed above  Patient states that she is planning a trip back to her home country [place] and plans to ambulate upon a high altitudes.  She is requesting cardiovascular guidance.  Her overall functional capacity is limited to her activities of daily living.  To better assess her cardiovascular health recommended considering echo and exercise treadmill stress test.  Patient would like to hold off testing.  Risk stratification cannot be provided at this time.  But I did inform her to take it easy and not to overexert if she is not able to perform such aerobic activity.  Orders Placed:  Orders Placed This Encounter  Procedures   TSH    Standing Status:   Future    Number of Occurrences:   1    Expected Date:   10/08/2023    Expiration Date:   10/07/2024   EKG 12-Lead    Final Medication List:   No orders of the defined types were placed in this encounter.   There are no discontinued medications.   Current Outpatient Medications:    Ascorbic Acid (VITAMIN C) 500 MG CHEW, Chew 1 tablet (500 mg total) by mouth daily., Disp: 30 tablet, Rfl: 3   calcitRIOL (ROCALTROL) 0.25 MCG capsule, Take 1 capsule (0.25 mcg total) by mouth 3 (three) times daily., Disp: 90 capsule, Rfl: 3   calcium-vitamin D (OSCAL WITH D) 500-200 MG-UNIT TABS tablet, Take by mouth., Disp: , Rfl:    cyclobenzaprine (FLEXERIL) 5 MG tablet, Take 1 tablet (5 mg total) by mouth 3 (three) times daily as needed for muscle spasms., Disp: 30 tablet, Rfl: 1   ibuprofen (ADVIL) 600 MG tablet, Take 1 tablet (600 mg total) by mouth every 8 (eight) hours as needed., Disp: 30 tablet,  Rfl: 0   Iron, Ferrous Sulfate, 325 (65 Fe) MG TABS, Take 325 mg by mouth daily., Disp: 30 tablet, Rfl: 3   levothyroxine (EUTHYROX) 100 MCG tablet, Take 1 tablet (100 mcg total) by mouth daily before breakfast., Disp: 30 tablet, Rfl: 3   metoprolol tartrate (LOPRESSOR) 25 MG tablet, Take 0.5 tablets (12.5 mg total) by mouth 2 (two) times daily., Disp: 60 tablet, Rfl: 0  Disposition:   Return if symptoms worsen or fail to improve.   Her questions and concerns were addressed to her satisfaction. She voices understanding of the recommendations provided during this encounter.    Signed, Tessa Lerner, DO, [facility]  [facility]  [address] [place], [place] [address] 10/10/2023 9:17 PM

## 2023-10-08 NOTE — Patient Instructions (Signed)
 Medication Instructions:  Your physician recommends that you continue on your current medications as directed. Please refer to the Current Medication list given to you today.  *If you need a refill on your cardiac medications before your next appointment, please call your pharmacy*  Lab Work: To be completed today: TSH  If you have labs (blood work) drawn today and your tests are completely normal, you will receive your results only by: MyChart Message (if you have MyChart) OR A paper copy in the mail If you have any lab test that is abnormal or we need to change your treatment, we will call you to review the results.  Testing/Procedures: None ordered today.  Follow-Up: At [facility], you and your health needs are our priority.  As part of our continuing mission to provide you with exceptional heart care, we have created designated Provider Care Teams.  These Care Teams include your primary Cardiologist (physician) and Advanced Practice Providers (APPs -  Physician Assistants and Nurse Practitioners) who all work together to provide you with the care you need, when you need it.  We recommend signing up for the patient portal called "MyChart".  Sign up information is provided on this After Visit Summary.  MyChart is used to connect with patients for Virtual Visits (Telemedicine).  Patients are able to view lab/test results, encounter notes, upcoming appointments, etc.  Non-urgent messages can be sent to your provider as well.   To learn more about what you can do with MyChart, go to [email or web address].    Your next appointment:   As needed   The format for your next appointment:   In Person  Provider:   Tessa Lerner, DO {

## 2023-10-09 LAB — TSH: TSH: 1.68 u[IU]/mL (ref 0.450–4.500)

## 2023-10-10 ENCOUNTER — Encounter: Payer: Self-pay | Admitting: Cardiology

## 2023-10-12 ENCOUNTER — Ambulatory Visit: Payer: Self-pay | Admitting: Family

## 2023-10-12 NOTE — Telephone Encounter (Signed)
 Interpreter: Hector#[id number] Chief Complaint: urinary pressure, odor Symptoms: urinary pressure, odor, fatigue, bodyache  Frequency: 1 week Pertinent Negatives: Patient denies blood in urine, fever, flank pain, pain with urination  Disposition: [] ED /[] Urgent Care (no appt availability in office) / [x] Appointment(In office/virtual)/ []  [place] [facility]/ [] Home Care/ [] Refused Recommended Disposition /[] [facility]/ []  Follow-up with PCP Additional Notes: Patient has been scheduled first available appointment- she is requesting am appointment if one opens    Copied from CRM [id number]. Topic: Clinical - Red Word Triage >> Oct 12, 2023  9:12 AM Hector Shade B wrote: [facility] that prompted transfer to Nurse Triage: patient states she has been having issues with urination she feels like she needs to urinates but is unable to, bodyaches, headaches, tiredness, odor in urination, chills. Reason for Disposition  Side (flank) or lower back pain present  Answer Assessment - Initial Assessment Questions 1. SYMPTOM: "What's the main symptom you're concerned about?" (e.g., frequency, incontinence)     Urinary pressure, odor 2. ONSET: "When did the  pressure  start?"     1 week 3. PAIN: "Is there any pain?" If Yes, ask: "How bad is it?" (Scale: 1-10; mild, moderate, severe)     No pain 4. CAUSE: "What do you think is causing the symptoms?"     Last year- November patient was seen for these same symptoms- patient was treated with medictaion 5. OTHER SYMPTOMS: "Do you have any other symptoms?" (e.g., blood in urine, fever, flank pain, pain with urination)     headache, body ache, chills  Protocols used: Urinary Symptoms-A-AH

## 2023-10-13 ENCOUNTER — Ambulatory Visit (INDEPENDENT_AMBULATORY_CARE_PROVIDER_SITE_OTHER): Admitting: Family

## 2023-10-13 ENCOUNTER — Encounter: Payer: Self-pay | Admitting: Family

## 2023-10-13 VITALS — BP 128/72 | HR 89 | Temp 97.8°F | Resp 20 | Ht 59.0 in | Wt 148.8 lb

## 2023-10-13 DIAGNOSIS — I10 Essential (primary) hypertension: Secondary | ICD-10-CM

## 2023-10-13 DIAGNOSIS — E1140 Type 2 diabetes mellitus with diabetic neuropathy, unspecified: Secondary | ICD-10-CM | POA: Diagnosis not present

## 2023-10-13 DIAGNOSIS — R350 Frequency of micturition: Secondary | ICD-10-CM | POA: Diagnosis not present

## 2023-10-13 DIAGNOSIS — R000 Tachycardia, unspecified: Secondary | ICD-10-CM | POA: Diagnosis not present

## 2023-10-13 DIAGNOSIS — D509 Iron deficiency anemia, unspecified: Secondary | ICD-10-CM | POA: Diagnosis not present

## 2023-10-13 LAB — POCT URINALYSIS DIPSTICK
Glucose, UA: NEGATIVE
Nitrite, UA: NEGATIVE
Protein, UA: POSITIVE — AB
Spec Grav, UA: 1.02 (ref 1.010–1.025)
Urobilinogen, UA: 0.2 U/dL
pH, UA: 6 (ref 5.0–8.0)

## 2023-10-13 MED ORDER — METOPROLOL TARTRATE 25 MG PO TABS
12.5000 mg | ORAL_TABLET | Freq: Two times a day (BID) | ORAL | 1 refills | Status: DC
Start: 2023-10-13 — End: 2024-01-14

## 2023-10-13 MED ORDER — CIPROFLOXACIN HCL 500 MG PO TABS: 500.0000 mg | ORAL_TABLET | Freq: Two times a day (BID) | ORAL | 0 refills | Status: AC

## 2023-10-13 NOTE — Progress Notes (Signed)
 Provider: Richarda Blade FNP-C  Latania Bascomb, Donalee Citrin, NP  Patient Care Team: Gracious Renken, Donalee Citrin, NP as PCP - General (Family Medicine) Tessa Lerner, DO as PCP - Cardiology (Cardiology)  Extended Emergency Contact Information Primary Emergency Contact: Rakes,Serafin Address: [address]          [place], [place] [address] [place] Home Phone: [phone] Mobile Phone: [phone] Relation: Spouse Secondary Emergency Contact: Needles,Steven Home Phone: [phone] Mobile Phone: [phone] Relation: Son  Code Status:  Full Code  Goals of care: Advanced Directive information    05/07/2023   11:01 AM  Advanced Directives  Does Patient Have a Medical Advance Directive? No     Chief Complaint  Patient presents with   Acute Visit    Urinary pressure and odor    Discussed the use of AI scribe software for clinical note transcription with the patient, who gave verbal consent to proceed.  History of Present Illness   The patient, with a history of urinary tract issues following a surgical complication in 2019, presents with increased urinary frequency and discomfort.  She has experienced increased frequency of urination for more than a week, accompanied by a sensation of inflammation or swelling in the lower abdominal area. There is no burning sensation during urination, but she notes a strong, unusual odor. No blood has been observed in her urine.  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 Since then, she has experienced frequent urinary issues, including urgency and infections. She sometimes experiences burning pain, particularly over the past weekend.  She mentions mild back pain and, over the past weekend, developed chills, headaches, body aches, and possibly a mild fever. Her temperature today is 97.90F.  She has experienced weight loss, dropping from 160  lbs to 148.8 lbs since last year. She attributes this to reduced portion sizes and decreased carbohydrate intake. She drinks very little water and has reduced her intake of sugary sodas and juices.  She is currently taking metoprolol, prescribed by her cardiologist, and has a supply remaining.   Past Medical History:  Diagnosis Date   Abnormal Pap smear    Anemia    Cervical cancer [facility]) oncologist- dr Andrey Farmer   Stage IB1  SCCa    Dyspnea    Fatigue    History of radiation therapy 11/01/2018   IMRT 09/27/2018-11/01/2018 to Pelvis, Vagina / 45 Gy in 25 fractions  Dr Antony Blackbird   History of radiation therapy 12/01/2018   Vaginal Cuff (area of recurrence) / 24 Gy in 4 fractions 11/10/2018-12/01/2018  Dr Antony Blackbird   Hyperlipidemia    Left breast mass 2013   7x4x4 mm 6o'clock   PONV (postoperative nausea and vomiting)    Pre-diabetes    Sepsis (HCC) 03/2018   Urosepsis, Resolved   Thyroid nodule 03/25/2018   inferior right thyroid, noted on US thyroid   Ureterovaginal fistula    Urgency of urination    intermittant   Urinary frequency    Past Surgical History:  Procedure Laterality Date   ABDOMINAL HYSTERECTOMY     BREAST BIOPSY Left 06/15/2012   CYSTOSCOPY W/ RETROGRADES Bilateral 02/16/2018   Procedure: CYSTOSCOPY WITH RETROGRADE PYELOGRAM BILATERAL DIAGNOSTIC URETEROSCOPY, BILATERAL  STENT PLACEMENT;  Surgeon: Crist Fat, MD;  Location: [facility];  Service: Urology;  Laterality: Bilateral;   CYSTOSCOPY W/ URETERAL STENT PLACEMENT Bilateral 04/22/2018   Procedure: CYSTOSCOPY WITH BILATERAL RETROGRADE  PYELOGRAM/ AND URETERAL STENT EXCHANGE, vaginal exam;  Surgeon: Crist Fat, MD;  Location: [facility];  Service: Urology;  Laterality: Bilateral;   CYSTOSCOPY W/ URETERAL STENT PLACEMENT Bilateral 05/28/2018   Procedure: CYSTOSCOPY WITH BILATERAL  RETROGRADE PYELOGRAM/URETERAL STENT REPLACEMENT, REMOVAL OF BILATERAL PERCUTANEOUS DRAINS;  Surgeon: Crist Fat, MD;  Location: [facility];  Service: Urology;  Laterality: Bilateral;   D & C LEEP CONIZATION BX  07-31-2003   dr p. rose [facility]   IR IMAGING GUIDED PORT INSERTION  09/27/2018   IR NEPHROSTOGRAM RIGHT THRU EXISTING ACCESS  03/18/2018   IR NEPHROSTOMY EXCHANGE LEFT  04/27/2018   IR NEPHROSTOMY EXCHANGE RIGHT  04/27/2018   IR NEPHROSTOMY PLACEMENT LEFT  03/18/2018   IR NEPHROSTOMY PLACEMENT RIGHT  03/02/2018   IR REMOVAL TUN ACCESS W/ PORT W/O FL MOD SED  03/17/2019   PELVIC LYMPH NODE DISSECTION Bilateral 02/08/2018   Procedure: BILATEAL PEVIC  LYMPHADENECTOMY;  Surgeon: Adolphus Birchwood, MD;  Location: [facility];  Service: Gynecology;  Laterality: Bilateral;   ROBOTIC ASSISTED TOTAL HYSTERECTOMY WITH BILATERAL SALPINGO OOPHERECTOMY N/A 02/08/2018   Procedure: XI ROBOTIC ASSISTED TOTAL Radical HYSTERECTOMY WITH BILATERAL SALPINGO OOPHORECTOMY;  Surgeon: Adolphus Birchwood, MD;  Location: [facility];  Service: Gynecology;  Laterality: N/A;   Transvaginal tape placement  03-12-2009  dr Scheryl Darter  [facility]   GYNECARE TENSION-FREE VAGINAL TAPE SLING   TUBAL LIGATION  05-28-2005  DR  MARSHALL  @ [facility]   PPTL    No Known Allergies  Outpatient Encounter Medications as of 10/13/2023  Medication Sig   Ascorbic Acid (VITAMIN C) 500 MG CHEW Chew 1 tablet (500 mg total) by mouth daily.   calcitRIOL (ROCALTROL) 0.25 MCG capsule Take 1 capsule (0.25 mcg total) by mouth 3 (three) times daily.   calcium-vitamin D (OSCAL WITH D) 500-200 MG-UNIT TABS tablet Take by mouth.   ciprofloxacin (CIPRO) 500 MG tablet Take 1 tablet (500 mg total) by mouth 2 (two) times daily for 7 days.   ibuprofen (ADVIL) 600 MG tablet Take 1 tablet (600 mg total) by mouth every 8 (eight) hours as needed.   levothyroxine (EUTHYROX) 100 MCG tablet Take 1 tablet (100 mcg total) by mouth daily before breakfast.   [DISCONTINUED] metoprolol tartrate (LOPRESSOR) 25 MG tablet Take 0.5 tablets (12.5 mg total) by mouth 2 (two) times daily.    cyclobenzaprine (FLEXERIL) 5 MG tablet Take 1 tablet (5 mg total) by mouth 3 (three) times daily as needed for muscle spasms. (Patient not taking: Reported on 10/13/2023)   Iron, Ferrous Sulfate, 325 (65 Fe) MG TABS Take 325 mg by mouth daily. (Patient not taking: Reported on 10/13/2023)   metoprolol tartrate (LOPRESSOR) 25 MG tablet Take 0.5 tablets (12.5 mg total) by mouth 2 (two) times daily.   No facility-administered encounter medications on file as of 10/13/2023.    Review of Systems  Constitutional:  Negative for appetite change, chills, fatigue, fever and unexpected weight change.  HENT:  Negative for congestion, ear discharge, ear pain, facial swelling, hearing loss, nosebleeds, postnasal drip, rhinorrhea, sinus pressure, sinus pain, sneezing, sore throat, tinnitus and trouble swallowing.   Eyes:  Negative for pain, discharge, redness, itching and visual disturbance.  Respiratory:  Negative for cough, chest tightness, shortness of breath and wheezing.   Cardiovascular:  Negative for chest pain, palpitations and leg swelling.  Gastrointestinal:  Negative for abdominal distention, abdominal pain, blood in stool, nausea and vomiting.  Genitourinary:  Positive for frequency. Negative for difficulty  urinating, dysuria, flank pain and urgency.  Musculoskeletal:  Negative for arthralgias, back pain, gait problem, joint swelling and myalgias.  Skin:  Negative for color change, pallor and rash.  Neurological:  Negative for dizziness, weakness, light-headedness, numbness and headaches.  Psychiatric/Behavioral:  Negative for agitation, behavioral problems, confusion, hallucinations and sleep disturbance. The patient is not nervous/anxious.     Immunization History  Administered Date(s) Administered   Influenza Split 07/11/2011   Influenza, High Dose Seasonal PF 05/26/2022   Influenza,inj,Quad PF,6+ Mos 04/20/2019   Influenza,inj,Quad PF,6-35 Mos 04/20/2019   Influenza-Unspecified 07/11/2011    PFIZER(Purple Top)SARS-COV-2 Vaccination 12/06/2019, 12/27/2019   Pneumococcal Polysaccharide-23 04/28/2021   Tdap 02/23/2018, 06/16/2021   Pertinent  Health Maintenance Due  Topic Date Due   FOOT EXAM  Never done   OPHTHALMOLOGY EXAM  Never done   MAMMOGRAM  12/23/2019   HEMOGLOBIN A1C  09/12/2022   INFLUENZA VACCINE  03/05/2023      11/27/2021   10:58 AM 03/12/2022    9:25 AM 04/03/2022   11:00 AM 05/05/2022   11:05 AM 05/28/2022   11:05 AM  Fall Risk  Falls in the past year? 0 0 0  0  Was there an injury with Fall? 0 0 0  0  Fall Risk Category Calculator 0 0 0  0  Fall Risk Category (Retired) Low Low Low  Low  (RETIRED) Patient Fall Risk Level Low fall risk Low fall risk Low fall risk Low fall risk Low fall risk  Patient at Risk for Falls Due to No Fall Risks No Fall Risks No Fall Risks  No Fall Risks  Fall risk Follow up Falls evaluation completed  Falls evaluation completed  Falls evaluation completed   Functional Status Survey:    Vitals:   10/13/23 1313 10/13/23 1339  BP: 128/72   Pulse: 100 89  Resp: 20   Temp: 97.8 F (36.6 C)   SpO2: 98%   Weight: 148 lb 12.8 oz (67.5 kg)   Height: 4\' 11"  (1.499 m)    Body mass index is 30.05 kg/m. Physical Exam  VITALS: T- 97.8, P- 100, BP- 128/72 MEASUREMENTS: Weight- 148.8. GENERAL: Alert, cooperative, well developed, no acute distress. HEENT: Normocephalic, normal oropharynx, moist mucous membranes. CHEST: Clear to auscultation bilaterally, no wheezes, rhonchi, or crackles. CARDIOVASCULAR: Normal heart rate and rhythm, S1 and S2 normal without murmurs. ABDOMEN: Soft, mild tenderness over bladder, non-distended, without organomegaly, normal bowel sounds. EXTREMITIES: No cyanosis or edema. NEUROLOGICAL: Cranial nerves grossly intact, moves all extremities without gross motor or sensory deficit. PSYCHIATRY /BEHAVIORAL: Mood stable   Labs reviewed: No results for input(s): "NA", "K", "CL", "CO2", "GLUCOSE", "BUN",  "CREATININE", "CALCIUM", "MG", "PHOS" in the last 8760 hours. No results for input(s): "AST", "ALT", "ALKPHOS", "BILITOT", "PROT", "ALBUMIN" in the last 8760 hours. No results for input(s): "WBC", "NEUTROABS", "HGB", "HCT", "MCV", "PLT" in the last 8760 hours. Lab Results  Component Value Date   TSH 1.680 10/08/2023   Lab Results  Component Value Date   HGBA1C 6.3 (H) 03/12/2022   Lab Results  Component Value Date   CHOL 181 04/17/2021   HDL 51 04/17/2021   LDLCALC 100 (H) 04/17/2021   TRIG 179 (H) 04/17/2021   CHOLHDL 3.5 04/17/2021    Significant Diagnostic Results in last 30 days:  No results found.  Assessment/Plan  Urinary Tract Infection (UTI) Presents with symptoms suggestive of a UTI, including increased urinary frequency, strong odor, mild fever, chills, headache, body aches, and mild suprapubic tenderness. Urinalysis shows  blood, white blood cells, and protein in the urine, but nitrites are negative, suggesting a possible non-bacterial cause. Urinary tract issues following a surgical complication in 2019 may contribute to recurrent infections. - Prescribe ciprofloxacin 500 mg, one tablet in the morning and one in the evening for 7 days. - Send urine culture for further analysis to confirm the presence of infection and sensitivity to antibiotics. - Advise to increase water intake to help flush the urinary system. - Discuss the potential for antibiotic-associated diarrhea and recommend probiotics or yogurt if diarrhea occurs.  Hypertension Currently on metoprolol for hypertension and heart rate control. Blood pressure today is 128/72 mmHg, within the normal range. Elevated heart rate managed with metoprolol. Discussed causes of hypertension, including stress, diet, and cholesterol. Emphasized stress management techniques such as walking, deep breathing, and listening to classical music. - Refill metoprolol with a 90-day supply and one additional refill. - Advise to continue  managing stress through relaxation techniques such as walking, deep breathing, and listening to classical music.  Weight Management Weight has decreased from 160 lbs to 148.8 lbs since July of the previous year. Attributes weight loss to reduced portion sizes and decreased carbohydrate intake. Encouraged to continue healthy eating and exercise. - Encourage continued healthy eating and portion control. - Advise to continue reducing carbohydrate intake and to exercise regularly.  General Health Maintenance Advised to maintain a healthy lifestyle, including adequate hydration and dietary modifications. Emphasized the importance of regular health check-ups and screenings. - Encourage increased water intake to support kidney function. - Schedule fasting blood work in June to check cholesterol, kidney function, liver function, electrolytes, anemia, and diabetes. - Coordinate with the endocrinologist for thyroid management.   Family/ staff Communication: Reviewed plan of care with patient verbalized understanding   Labs/tests ordered: None   Next Appointment: Return in about 3 months (around 01/13/2024) for fasting labs prior to visit.   Total time:25 minutes. Greater than 50% of total time spent doing patient education regarding UTI,HTN,Tachycardia,Anemia, health maintenance including symptom/medication management.   Caesar Bookman, NP

## 2023-10-14 ENCOUNTER — Telehealth: Payer: Self-pay | Admitting: *Deleted

## 2023-10-14 NOTE — Telephone Encounter (Signed)
 Spoke with Raynelle Fanning, in house spanish interpreter. Pt needs a follow up appt. With Dr. Pricilla Holm. Pt was scheduled for a follow up with Dr. Pricilla Holm on Friday, May 9th at 3:30. Raynelle Fanning called the patient and confirmed and pt agreed to date and time of her appt.

## 2023-10-15 LAB — URINE CULTURE
MICRO NUMBER:: 16188134
SPECIMEN QUALITY:: ADEQUATE

## 2023-12-11 ENCOUNTER — Inpatient Hospital Stay: Attending: Gynecologic Oncology | Admitting: Gynecologic Oncology

## 2023-12-11 ENCOUNTER — Other Ambulatory Visit (HOSPITAL_COMMUNITY)
Admission: RE | Admit: 2023-12-11 | Discharge: 2023-12-11 | Disposition: A | Discharge status: Home / Self Care | Source: Ambulatory Visit

## 2023-12-11 ENCOUNTER — Encounter: Payer: Self-pay | Admitting: Gynecologic Oncology

## 2023-12-11 VITALS — BP 111/69 | HR 82 | Temp 98.6°F | Resp 16 | Ht 59.0 in | Wt 148.2 lb

## 2023-12-11 DIAGNOSIS — Z8541 Personal history of malignant neoplasm of cervix uteri: Secondary | ICD-10-CM | POA: Insufficient documentation

## 2023-12-11 DIAGNOSIS — C539 Malignant neoplasm of cervix uteri, unspecified: Secondary | ICD-10-CM | POA: Insufficient documentation

## 2023-12-11 DIAGNOSIS — Z90710 Acquired absence of both cervix and uterus: Secondary | ICD-10-CM | POA: Insufficient documentation

## 2023-12-11 DIAGNOSIS — Z90722 Acquired absence of ovaries, bilateral: Secondary | ICD-10-CM | POA: Diagnosis not present

## 2023-12-11 DIAGNOSIS — C7982 Secondary malignant neoplasm of genital organs: Secondary | ICD-10-CM | POA: Insufficient documentation

## 2023-12-11 DIAGNOSIS — Z9079 Acquired absence of other genital organ(s): Secondary | ICD-10-CM | POA: Diagnosis not present

## 2023-12-11 DIAGNOSIS — Z08 Encounter for follow-up examination after completed treatment for malignant neoplasm: Secondary | ICD-10-CM | POA: Diagnosis present

## 2023-12-11 DIAGNOSIS — B977 Papillomavirus as the cause of diseases classified elsewhere: Secondary | ICD-10-CM | POA: Insufficient documentation

## 2023-12-11 DIAGNOSIS — Z923 Personal history of irradiation: Secondary | ICD-10-CM | POA: Insufficient documentation

## 2023-12-11 DIAGNOSIS — Z9221 Personal history of antineoplastic chemotherapy: Secondary | ICD-10-CM | POA: Insufficient documentation

## 2023-12-11 DIAGNOSIS — Z8542 Personal history of malignant neoplasm of other parts of uterus: Secondary | ICD-10-CM | POA: Diagnosis present

## 2023-12-11 NOTE — Progress Notes (Signed)
 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 Endocervix, curettage DETACHED FRAGMENTS OF SQUAMOUS MUCOSA WITH KOILOCYTIC ATYPIA, SEE COMMENT. 2. Cervix, biopsy LOW GRADE SQUAMOUS INTRAEPITHELIAL LESION, CIN-I (MILD DYSPLASIA), SEE COMMENT. Microscopic Comment 1. 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 2. The findings   01/09/2012 Pathology Results   LOW GRADE SQUAMOUS INTRAEPITHELIAL LESION: CIN-1/ HPV (LSIL). ENDOMETRIAL CELLS ARE PRESENT.   01/12/2018 Pathology Results   Cervix, biopsy - INVASIVE SQUAMOUS CELL CARCINOMA - SEE COMMENT   02/08/2018 Pathology Results   1. Lymph nodes, regional resection, right pelvic - EIGHT LYMPH NODES, NEGATIVE FOR CARCINOMA (0/8). 2. Lymph nodes, regional resection, left pelvic - TWELVE LYMPH NODES, NEGATIVE FOR CARCINOMA (0/12). 3. Uterus +/- tubes/ovaries, neoplastic, cervix, bilateral tubes and ovaries, upper third of vagina - INVASIVE MODERATELY DIFFERENTIATED SQUAMOUS CELL CARCINOMA, 2.6 CM, INVOLVING ANTERIOR PORTION OF CERVIX FROM 9 O'CLOCK TO 3 O'CLOCK. - TUMOR INVADES FOR DEPTH OF 0.5 CM AND IS CONFINED TO THE CERVIX. - ALL RESECTION MARGINS ARE NEGATIVE FOR CARCINOMA; THE CLOSEST IS THE VAGINAL CUFF MARGIN AT 1 CM. - NEGATIVE FOR LYMPHOVASCULAR OR PERINEURAL INVASION. - BENIGN ENDOMETRIAL POLYP, 2.6 CM. - BENIGN LEIOMYOMA, 2.2 CM - BENIGN INACTIVE ENDOMETRIUM. - BENIGN BILATERAL OVARIES AND FALLOPIAN TUBES. - SEE ONCOLOGY TABLE. Microscopic Comment 3. UTERINE CERVIX: Resection Procedure: Radial hysterectomy. Tumor Size: 2.6 cm. Histologic Type: Squamous cell carcinoma. Histologic Grade: G2: Moderately differentiated. Stromal Invasion: Depth of stromal invasion  (millimeters): 5 mm. Horizontal extent longitudinal/length (if applicable#) (millimeters): 20 mm. Horizontal extent circumferential/width (if applicable#) (millimeters): 26 mm. Other Tissue/ Organ: Not involved. Margins: Negative for carcinoma. Lymphovascular Invasion: Not identified. Regional Lymph Nodes: All lymph nodes negative for tumor cells Total Number of Lymph Nodes Examined: 20. Number of Sentinel Nodes Examined (if applicable): 0. Pathologic Stage Classification (pTNM, AJCC 8th Edition): pT1b1, pN0. Ancillary Studies: Not applicable. Representative Tumor Block: 3E, 2F, and 3G.   02/08/2018 Surgery   Operation: Robotic-assisted type III radical laparoscopic hysterectomy with bilateral salpingoophorectomy and bilateral pelvic lymphadenectomy   Surgeon: Diania Fortes    Operative Findings:  : 2-3cm pedunculated exophytic mass from right anterior cervix. No clinical involvement of the parametria, no suspicious nodes   02/16/2018 Surgery   Procedure: Cystoscopy, bilateral retrograde pyelograms with interpretation Diagnostic ureteroscopy Bilateral ureteral stent placement   Surgeon: Andrez Banker, MD    Intraoperative findings:  #1: The retrograde pyelogram on the patient's right side initially demonstrated significant contrast extravasation several centimeters from the ureteral orifice.  There was proximal hydroureteronephrosis. #2: Ureteroscopy of the right ureter demonstrated a full-thickness ureteral injury likely thermal in nature approximately 2-1/2 cm from the UVJ. #3: The retrograde on the patient's left side demonstrated severe extravasation with no contrast getting beyond the first centimeter of the ureter.  Once beyond this area with a ureteroscope the ureter was mild to moderately dilated. 4.:  Ureteroscopy of the left ureter demonstrated an area approximately 2 cm from the UVJ and lasting approximately a centimeter and a half of devitalized tissue,  full-thickness, likely thermal in nature. #5: 5 French x22 cm Polaris stents were placed bilaterally.     03/22/2018 Imaging   Highly suspicious nodule in the inferior right thyroid  lobe corresponds with the hypermetabolic activity on the PET-CT. Recommend ultrasound-guided biopsy  of this right thyroid  nodule.   No other thyroid  nodules.   04/22/2018 Surgery   Procedure: Cystoscopy Bilateral retrograde pyelogram with interpretation Bilateral ureteral stent exchange Left ureteroscopy, diagnostic   Surgeon: Andrez Banker, MD    Intraoperative findings:  #1: On speculum exam there was a tine from the right ureteral stent (Polaris) noted at the vaginal cuff.  The remainder of the vaginal cuff was healed, including the area around the ureteral stent tine. 2.:  The right retrograde pyelogram was performed using 10 cc of Omni, and demonstrated normal caliber ureter with no significant hydroureteronephrosis.  There did not appear to be any communication or extravasation of contrast in the distal ureter or UVJ with the vagina. 3.:  The left retrograde pyelogram demonstrated a small area of extravasation at the UVJ, nearly the intramural ureter, that seemed to communicate with the patient's vagina.  There were no other significant filling defects, there is no hydroureteronephrosis. 4.:  Left diagnostic ureteroscopy demonstrated an abnormality within the intramural or very distal UVJ region where there was some bullous edema and likely the site of the fistula.  There was no identifiable communication.  The remaining aspect of the ureter was normal.   05/28/2018 Surgery   Procedure: Bilateral retrograde pyelogram with interpretation Bilateral diagnostic ureteroscopy Bilateral ureteral stent exchange Bilateral nephrostomy tube removal   Surgeon: Andrez Banker, MD    Intraoperative findings:  #1: 5 French open-ended ureteral catheter was used to perform a retrograde pyelogram in the  patient's left ureter which demonstrated normal caliber ureter with no hydro-.  However, there was a narrowed segment at the intramural ureter and just at the UVJ with a prominent fistula draining into the vagina. #2: I removed the wire to see the how well the ureter with drain, it did not drain well at all.  As such I tried to repassed the wire and ultimately had to pass a ureteroscope through the distal ureter in order to get the wire up into the left renal pelvis.  This noted distorted and abnormal mucosa in the intravesical and ureterovesical junction. #3: The patient's right sided ureteral stent distal had migrated into the patient's vagina.  In order to remove the stent I had to perform ureteroscopy on the right ureter cannulating the right ureteral orifice and advancing it through the intramural ureter and beyond the distal ureter.  Once I was up beyond the fistula I passed the wire through the scope and into the right renal pelvis.  Once the wire was in the pelvis I removed the scope and pulled the stent through the patient's vagina. #4: I then exchanged the 0.38 sensor wire in the right collecting system for a 5 [place] open-ended ureteral catheter performed retrograde pyelogram which demonstrated prominent fistula in the distal ureter/UVJ. #5: A 24 cm x 6 French double-J ureteral stent was placed in the right ureter. #6: A 22 cm x 5 French Polaris catheter was placed in the patient's left ureter   07/02/2018 Surgery   Procedure: Robotic assisted laparoscopic bilateral ureteral reimplant   Surgeon: Andrez Banker, MD First Assistant: Dr. Alphonso Aschoff, MD    Intraoperative findings:  #1: The patient's ureters were thickened, and adherent to the sidewalls, but there were able to be reimplanted without any tension. #2: The patient had a mid urethral sling mesh noted around the suprapubic bone that was unmolested.   08/27/2018 Pathology Results   Vagina, biopsy, left upper vaginal side -  wall - SQUAMOUS CELL  CARCINOMA.   09/15/2018 Cancer Staging   Staging form: Cervix Uteri, AJCC 7th Edition - Pathologic: FIGO Stage IB1 (T1b1, N0, cM0) - Signed by Almeda Jacobs, MD on 09/16/2018   09/27/2018 Procedure   Successful placement of a right IJ approach Power Port with ultrasound and fluoroscopic guidance. The catheter is ready for use.   09/27/2018 -  Radiation Therapy   She has daily radiation   10/01/2018 -  Chemotherapy   The patient had weekly cisplatin    03/02/2019 PET scan   1. No evidence of metastatic disease. Previously questioned hypermetabolism along the left vaginal cuff is not appreciated on the current study. 2. Hypermetabolic right thyroid  nodule. Patient underwent ultrasound-guided biopsy 09/23/2018. 3.  Aortic atherosclerosis (ICD10-170.0).   03/17/2019 Procedure   Successful right IJ vein Port-A-Cath explant.   Primary thyroid  cancer (HCC)  09/23/2018 Pathology Results   THYROID , FINE NEEDLE ASPIRATION, INFERIOR RIGHT (SPECIMEN 1 OF 1, COLLECTED 09/23/18): FINDINGS CONSISTENT WITH PAPILLARY CARCINOMA (B[place] CATEGORY VI).    She had been undergoing cervical cancer screening through [facility]. 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 01/2012 LSIL + endometrial cells 07/2012 Pap negative 01/2013 Pap negative with HRHPV not detected 01/2015 Pap negative and HRHPV not detected   Was noting postmenopausal bleeding and saw [facility] clinic again. 12/2017 Pap negative now HRHPV detected A mass was noted on exam and she was referred to the Gyn clinic where Dr. Adriana Hopping did a cervical biopsy 01/12/18 which showed cervical squamous cell carcinoma.    She underwent a PET/CT which showed no apparent metastatic disease. On 02/08/18 she underwent robotic assisted radical hysterectomy, upper vaginectomy, bilateral pelvic lymphadenectomy.  Operative findings were significant for a 2 to 3 cm pedunculated exophytic mass from the right anterior cervix.  There was no  clinical involvement of the parametrium and no suspicious nodes.  The surgery was overall fairly uncomplicated there was some increased bleeding encountered during the dissection around the posterior bladder pillars.  At the completion of the procedure the ureters bilaterally appeared intact and were completely skeletonized in the distal third free from all parametrial tissue.  Her tumor was staged as IB2, squamous cell carcinoma of the cervix, and low risk features were noted therefore no adjuvant therapy was recommended.   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 Dulcy Gibney.  During that cystoscopic procedure Dr. Dulcy Gibney placed ureteral stents bilaterally.  On 03/02/18 she underwent right percutaneous nephrostomy tube placement. After placement of the right PCN she noted slight decrease in urinary leakage from the vagina, though still persisted, therefore placement of a left PCN was placed on 03/18/18.   She was followed by Dr Dulcy Gibney who performed ureteroscopy in early October, 2019 and identified resolution of left fistula and almost resolution in right.  On 06/24/18 Dr Dulcy Gibney performed robotic assisted bilateral ureteral reimplantation (ureteroneocystotomy).   She was seen on 08/27/17 as part of routine scheduled surveillance. She denied vaginal bleeding symptoms.  However, on vaginal examiation, a soft exophytic 1cm lesion was identified at the left vaginal fornix. It was biopsied. It was resulted as squamous cell carcinoma, consistent with recurrence.  PET showed localized recurrent disease at the left vaginal fornix and no distant sites (though a thyroid  nodule was pet avid).  She was treated with salvage chemoradiation between September 27, 2018 and December 01, 2018.  This included 5 cycles of weekly radiosensitizing  cisplatin  40 mg per metered squared.  Additionally which she  received IMRT with 45 Gray in 25 fractions to the pelvis and vagina between therapy 24th and November 01, 2018, and high-dose-rate vaginal cuff brachytherapy, 24 Gray in 4 fractions, delivered between April 8 and December 01, 2018.   She tolerated therapy well and had no adverse effects.   Post treatment PET was performed (prior to her thyroidectomy) on March 02, 2019.  This revealed persistent focal hypermetabolism in the right lobe of the thyroid , no evidence of metastatic disease, previously questioned hypermetabolism along the left vaginal cuff was not appreciated. Work-up of the thyroid  findings confirmed thyroid  cancer and she then underwent thyroidectomy at [facility] hospital and subsequent radioactive iodine.   She was felt to have had a complete clinical response to her salvage therapy for recurrent cervical cancer and transitioned to surveillance.   Pap on 12/24/20 showed ASCUS, negative for high risk HPV. She has symptoms of urinary frequency (negative testing for UTI). Most likely attributable to her radiation.    Interval History: Doing well.  Endorses some fatigue which she thinks is related to helping to care for her young grandchildren.  Denies any vaginal bleeding or discharge.  Reports baseline bowel bladder function.  Notes some frequency when she is more active.  Past Medical/Surgical History: Past Medical History:  Diagnosis Date   Abnormal Pap smear    Anemia    Cervical cancer [facility]) oncologist- dr Pearly Bound   Stage IB1  SCCa    Dyspnea    Fatigue    History of radiation therapy 11/01/2018   IMRT 09/27/2018-11/01/2018 to Pelvis, Vagina / 45 Gy in 25 fractions  Dr Retta Caster   History of radiation therapy 12/01/2018   Vaginal Cuff (area of recurrence) / 24 Gy in 4 fractions 11/10/2018-12/01/2018  Dr Retta Caster   Hyperlipidemia    Left breast mass 2013   7x4x4 mm 6o'clock   PONV (postoperative nausea and vomiting)    Pre-diabetes    Sepsis (HCC) 03/2018   Urosepsis,  Resolved   Thyroid  nodule 03/25/2018   inferior right thyroid , noted on US  thyroid    Ureterovaginal fistula    Urgency of urination    intermittant   Urinary frequency     Past Surgical History:  Procedure Laterality Date   ABDOMINAL HYSTERECTOMY     BREAST BIOPSY Left 06/15/2012   CYSTOSCOPY W/ RETROGRADES Bilateral 02/16/2018   Procedure: CYSTOSCOPY WITH RETROGRADE PYELOGRAM BILATERAL DIAGNOSTIC URETEROSCOPY, BILATERAL  STENT PLACEMENT;  Surgeon: Andrez Banker, MD;  Location: [facility];  Service: Urology;  Laterality: Bilateral;   CYSTOSCOPY W/ URETERAL STENT PLACEMENT Bilateral 04/22/2018   Procedure: CYSTOSCOPY WITH BILATERAL RETROGRADE PYELOGRAM/ AND URETERAL STENT EXCHANGE, vaginal exam;  Surgeon: Andrez Banker, MD;  Location: [facility];  Service: Urology;  Laterality: Bilateral;   CYSTOSCOPY W/ URETERAL STENT PLACEMENT Bilateral 05/28/2018   Procedure: CYSTOSCOPY WITH BILATERAL  RETROGRADE PYELOGRAM/URETERAL STENT REPLACEMENT, REMOVAL OF BILATERAL PERCUTANEOUS DRAINS;  Surgeon: Andrez Banker, MD;  Location: [facility];  Service: Urology;  Laterality: Bilateral;   D & C LEEP CONIZATION BX  07-31-2003   dr p. rose [facility]   IR IMAGING GUIDED PORT INSERTION  09/27/2018   IR NEPHROSTOGRAM RIGHT THRU EXISTING ACCESS  03/18/2018   IR NEPHROSTOMY EXCHANGE LEFT  04/27/2018   IR NEPHROSTOMY EXCHANGE RIGHT  04/27/2018   IR NEPHROSTOMY PLACEMENT LEFT  03/18/2018   IR NEPHROSTOMY PLACEMENT RIGHT  03/02/2018   IR REMOVAL  TUN ACCESS W/ PORT W/O FL MOD SED  03/17/2019   PELVIC LYMPH NODE DISSECTION Bilateral 02/08/2018   Procedure: BILATEAL PEVIC  LYMPHADENECTOMY;  Surgeon: Alphonso Aschoff, MD;  Location: [facility];  Service: Gynecology;  Laterality: Bilateral;   ROBOTIC ASSISTED TOTAL HYSTERECTOMY WITH BILATERAL SALPINGO OOPHERECTOMY N/A 02/08/2018   Procedure: XI ROBOTIC ASSISTED TOTAL Radical HYSTERECTOMY WITH BILATERAL SALPINGO OOPHORECTOMY;  Surgeon: Alphonso Aschoff, MD;   Location: [facility];  Service: Gynecology;  Laterality: N/A;   Transvaginal tape placement  03-12-2009  dr Onnie Bilis  [facility]   GYNECARE TENSION-FREE VAGINAL TAPE SLING   TUBAL LIGATION  05-28-2005  DR  MARSHALL  @ [facility]   PPTL    Family History  Problem Relation Age of Onset   Hypertension Mother    Heart disease Mother    Hypertension Brother    Heart disease Brother     Social History   Socioeconomic History   Marital status: Married    Spouse name: Not on file   Number of children: 5   Years of education: Not on file   Highest education level: Not on file  Occupational History   Not on file  Tobacco Use   Smoking status: Never   Smokeless tobacco: Never  Vaping Use   Vaping status: Never Used  Substance and Sexual Activity   Alcohol use: Never   Drug use: Never   Sexual activity: Yes    Birth control/protection: Surgical  Other Topics Concern   Not on file  Social History Narrative   Not on file   Social Drivers of Health   Financial Resource Strain: Not on file  Food Insecurity: Low Risk  (11/11/2023)   Received from [facility]   Hunger Vital Sign    Worried About Running Out of Food in the Last Year: Never true    Ran Out of Food in the Last Year: Never true  Transportation Needs: No Transportation Needs (11/11/2023)   Received from [employer]    In the past 12 months, has lack of reliable transportation kept you from medical appointments, meetings, work or from getting things needed for daily living? : No  Physical Activity: Not on file  Stress: Not on file  Social Connections: Not on file    Current Medications:  Current Outpatient Medications:    Ascorbic Acid (VITAMIN C ) 500 MG CHEW, Chew 1 tablet (500 mg total) by mouth daily., Disp: 30 tablet, Rfl: 3   calcitRIOL  (ROCALTROL ) 0.25 MCG capsule, Take 1 capsule (0.25 mcg total) by mouth 3 (three) times daily. (Patient taking differently: Take 0.25 mcg by mouth 2 (two) times daily.),  Disp: 90 capsule, Rfl: 3   calcium-vitamin D (OSCAL WITH D) 500-200 MG-UNIT TABS tablet, Take by mouth., Disp: , Rfl:    levothyroxine  (EUTHYROX ) 100 MCG tablet, Take 1 tablet (100 mcg total) by mouth daily before breakfast., Disp: 30 tablet, Rfl: 3   metoprolol  tartrate (LOPRESSOR ) 25 MG tablet, Take 0.5 tablets (12.5 mg total) by mouth 2 (two) times daily., Disp: 180 tablet, Rfl: 1  Review of Systems: Denies appetite changes, fevers, chills, fatigue, unexplained weight changes. Denies hearing loss, neck lumps or masses, mouth sores, ringing in ears or voice changes. Denies cough or wheezing.  Denies shortness of breath. Denies chest pain or palpitations. Denies leg swelling. Denies abdominal distention, pain, blood in stools, constipation, diarrhea, nausea, vomiting, or early satiety. Denies pain with intercourse, dysuria, hematuria or incontinence. Denies hot flashes, pelvic pain, vaginal  bleeding or vaginal discharge.   Denies joint pain, back pain or muscle pain/cramps. Denies itching, rash, or wounds. Denies dizziness, headaches, numbness or seizures. Denies swollen lymph nodes or glands, denies easy bruising or bleeding. Denies anxiety, depression, confusion, or decreased concentration.  Physical Exam: BP 111/69 (BP Location: Left Arm, Patient Position: Sitting)   Pulse 82   Temp 98.6 F (37 C) (Oral)   Resp 16   Ht 4\' 11"  (1.499 m)   Wt 148 lb 3.2 oz (67.2 kg)   LMP 11/16/2012   SpO2 99%   BMI 29.93 kg/m  General: Alert, oriented, no acute distress. HEENT: Normocephalic, atraumatic, sclera anicteric. Chest: Clear to auscultation bilaterally.  No wheezes or rhonchi. Cardiovascular: Regular rate and rhythm, no murmurs. Abdomen: Obese, soft, nontender.  Normoactive bowel sounds.  No masses or hepatosplenomegaly appreciated.  Well-healed incisions. Extremities: Grossly normal range of motion.  Warm, well perfused.  No edema bilaterally. Skin: No rashes or lesions  noted. Lymphatics: No cervical, supraclavicular, or inguinal adenopathy. GU: Normal appearing external genitalia without erythema, excoriation, or lesions.  Speculum exam reveals moderately atrophic, radiation changes noted.  No masses or lesions, no atypical vascularity.  No bleeding or discharge.  Pap/HPV collected. Bimanual exam reveals vagina smooth, no masses or nodularity.  Rectovaginal exam confirms these findings.  Laboratory & Radiologic Studies: None new  Assessment & Plan: Amy Morrow is a 63 y.o. 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  Postoperative period after her initial diagnosis was complicated by development of bilateral ureterovaginal fistulas, repaired in 06/2018. CT imaging negative for recurrent disease last in 11/2021.   The patient is overall doing well.  She is NED on exam today.   Pap and HPV collected today. We will notify her with there results.   She is not approximately 5 years out from completion of salvage chemoradiation after recurrence.  We discussed transitioning to yearly visits with us  or yearly visit with an OB/GYN.  Preference is to see Melissa yearly for at least the next couple of years and then we will discuss transitioning to yearly visits with an OB/GYN.  Recommend continued yearly Pap and HPV testing.  20 minutes of total time was spent for this patient encounter, including preparation, face-to-face counseling with the patient and coordination of care, and documentation of the encounter.  Wiley Hanger, MD  Division of Gynecologic Oncology  Department of Obstetrics and Gynecology  [facility]

## 2023-12-11 NOTE — Patient Instructions (Signed)
 It was good to see you today.  I do not see or feel any evidence of cancer recurrence on your exam.  You will see Melissa for follow-up in 12 months.  As always, if you develop any new and concerning symptoms before your next visit, please call to see me sooner.

## 2023-12-16 ENCOUNTER — Ambulatory Visit: Payer: Self-pay | Admitting: Gynecologic Oncology

## 2023-12-16 LAB — CYTOLOGY - PAP
Comment: NEGATIVE
Diagnosis: NEGATIVE
High risk HPV: NEGATIVE

## 2023-12-16 NOTE — Telephone Encounter (Signed)
-----   Message from Suzi Essex sent at 12/16/2023 12:28 PM EDT ----- Could you please let her know that pap is normal/negative as well as her HPV? Thank you!

## 2023-12-16 NOTE — Telephone Encounter (Signed)
 Attempted to reach Ms. Ruisi through  [place] interpreter id # [phone] in regards to lab results. Left voicemail requesting call back.

## 2023-12-16 NOTE — Progress Notes (Signed)
 Could you please let her know that pap is normal/negative as well as her HPV? Thank you!

## 2023-12-18 NOTE — Telephone Encounter (Signed)
 Spoke with Ms. Hauschildt through [address] Elinor Guardian id #[id number] and relayed message from Dr. Orvil Bland that patient's Pap is normal/negative as well as HPV. Pt verbalized understanding and thanked the office for calling.

## 2023-12-18 NOTE — Telephone Encounter (Signed)
-----   Message from Amy Morrow sent at 12/16/2023 12:28 PM EDT ----- Could you please let her know that pap is normal/negative as well as her HPV? Thank you!

## 2023-12-31 ENCOUNTER — Ambulatory Visit (INDEPENDENT_AMBULATORY_CARE_PROVIDER_SITE_OTHER): Admitting: Family

## 2023-12-31 ENCOUNTER — Encounter: Payer: Self-pay | Admitting: Family

## 2023-12-31 VITALS — BP 128/72 | HR 85 | Temp 97.6°F | Resp 18 | Ht 59.0 in | Wt 149.4 lb

## 2023-12-31 DIAGNOSIS — L509 Urticaria, unspecified: Secondary | ICD-10-CM | POA: Diagnosis not present

## 2023-12-31 MED ORDER — PREDNISONE 20 MG PO TABS: ORAL_TABLET | ORAL | 0 refills | Status: AC

## 2023-12-31 NOTE — Progress Notes (Signed)
 Provider: Christean Courts FNP-C  Anyelo Mccue, Elijio Guadeloupe, NP  Patient Care Team: Zaveon Gillen, Elijio Guadeloupe, NP as PCP - General (Family Medicine) Olinda Bertrand, DO as PCP - Cardiology (Cardiology)  Extended Emergency Contact Information Primary Emergency Contact: Pe,Serafin Address: [address]          [place], [place] [address] [place] Home Phone: [phone] Mobile Phone: [phone] Relation: Spouse Secondary Emergency Contact: Yeagley,Steven Home Phone: [phone] Mobile Phone: [phone] Relation: Son  Code Status:  Full Code  Goals of care: Advanced Directive information    12/31/2023    9:57 AM  Advanced Directives  Does Patient Have a Medical Advance Directive? No  Would patient like information on creating a medical advance directive? No - Patient declined     Chief Complaint  Patient presents with   Rash    Itchy all over body.    Discussed the use of AI scribe software for clinical note transcription with the patient, who gave verbal consent to proceed.  History of Present Illness   Amy Morrow is a 63 year old female who presents with a widespread rash and itching.  She experienced a sudden onset of a rash and itching that began yesterday afternoon. The rash is widespread, affecting her legs, stomach, back, chest, face, neck, ears, and scalp, with itching occurring simultaneously across these areas.  No recent outdoor activities, changes in diet, new medications, or changes in personal care products such as lotions or soaps. No exposure to new pets, although she has a long-standing dog and two cats in the household. No similar rashes in the past, except for a circular rash about ten to twelve years ago, which was different in appearance.  Current medications include vitamin D, calcium, calcitriol , levothyroxine , vitamin C , and metformin , with no recent changes or additions. No known allergies to medications or foods.  She lives in a  household with ten people, including her children and grandchildren, and feels stressed and overworked. She feels tired and overworked, which her son suggested might be contributing to her symptoms.  No fever, cough, runny nose, itchy or tearing eyes, throat swelling, difficulty swallowing, nausea, vomiting, or leg swelling. No shortness of breath, or wheezing She reports good appetite but notes that the itching has been keeping her awake at night.   Past Medical History:  Diagnosis Date   Abnormal Pap smear    Anemia    Cervical cancer [facility]) oncologist- dr Pearly Bound   Stage IB1  SCCa    Dyspnea    Fatigue    History of radiation therapy 11/01/2018   IMRT 09/27/2018-11/01/2018 to Pelvis, Vagina / 45 Gy in 25 fractions  Dr Retta Caster   History of radiation therapy 12/01/2018   Vaginal Cuff (area of recurrence) / 24 Gy in 4 fractions 11/10/2018-12/01/2018  Dr Retta Caster   Hyperlipidemia    Left breast mass 2013   7x4x4 mm 6o'clock   PONV (postoperative nausea and vomiting)    Pre-diabetes    Sepsis (HCC) 03/2018   Urosepsis, Resolved   Thyroid  nodule 03/25/2018   inferior right thyroid , noted on US  thyroid    Ureterovaginal fistula    Urgency of urination    intermittant   Urinary frequency    Past Surgical History:  Procedure Laterality Date   ABDOMINAL HYSTERECTOMY     BREAST BIOPSY Left 06/15/2012   CYSTOSCOPY W/ RETROGRADES Bilateral 02/16/2018   Procedure: CYSTOSCOPY WITH RETROGRADE PYELOGRAM BILATERAL DIAGNOSTIC URETEROSCOPY, BILATERAL  STENT PLACEMENT;  Surgeon:  Andrez Banker, MD;  Location: [facility];  Service: Urology;  Laterality: Bilateral;   CYSTOSCOPY W/ URETERAL STENT PLACEMENT Bilateral 04/22/2018   Procedure: CYSTOSCOPY WITH BILATERAL RETROGRADE PYELOGRAM/ AND URETERAL STENT EXCHANGE, vaginal exam;  Surgeon: Andrez Banker, MD;  Location: [facility];  Service: Urology;  Laterality: Bilateral;   CYSTOSCOPY W/ URETERAL STENT PLACEMENT Bilateral  05/28/2018   Procedure: CYSTOSCOPY WITH BILATERAL  RETROGRADE PYELOGRAM/URETERAL STENT REPLACEMENT, REMOVAL OF BILATERAL PERCUTANEOUS DRAINS;  Surgeon: Andrez Banker, MD;  Location: [facility];  Service: Urology;  Laterality: Bilateral;   D & C LEEP CONIZATION BX  07-31-2003   dr p. rose [facility]   IR IMAGING GUIDED PORT INSERTION  09/27/2018   IR NEPHROSTOGRAM RIGHT THRU EXISTING ACCESS  03/18/2018   IR NEPHROSTOMY EXCHANGE LEFT  04/27/2018   IR NEPHROSTOMY EXCHANGE RIGHT  04/27/2018   IR NEPHROSTOMY PLACEMENT LEFT  03/18/2018   IR NEPHROSTOMY PLACEMENT RIGHT  03/02/2018   IR REMOVAL TUN ACCESS W/ PORT W/O FL MOD SED  03/17/2019   PELVIC LYMPH NODE DISSECTION Bilateral 02/08/2018   Procedure: BILATEAL PEVIC  LYMPHADENECTOMY;  Surgeon: Alphonso Aschoff, MD;  Location: [facility];  Service: Gynecology;  Laterality: Bilateral;   ROBOTIC ASSISTED TOTAL HYSTERECTOMY WITH BILATERAL SALPINGO OOPHERECTOMY N/A 02/08/2018   Procedure: XI ROBOTIC ASSISTED TOTAL Radical HYSTERECTOMY WITH BILATERAL SALPINGO OOPHORECTOMY;  Surgeon: Alphonso Aschoff, MD;  Location: [facility];  Service: Gynecology;  Laterality: N/A;   Transvaginal tape placement  03-12-2009  dr Onnie Bilis  [facility]   GYNECARE TENSION-FREE VAGINAL TAPE SLING   TUBAL LIGATION  05-28-2005  DR  MARSHALL  @ [facility]   PPTL    No Known Allergies  Outpatient Encounter Medications as of 12/31/2023  Medication Sig   Ascorbic Acid (VITAMIN C ) 500 MG CHEW Chew 1 tablet (500 mg total) by mouth daily.   calcitRIOL  (ROCALTROL ) 0.25 MCG capsule Take 1 capsule (0.25 mcg total) by mouth 3 (three) times daily. (Patient taking differently: Take 0.25 mcg by mouth 2 (two) times daily.)   calcium-vitamin D (OSCAL WITH D) 500-200 MG-UNIT TABS tablet Take by mouth.   levothyroxine  (EUTHYROX ) 100 MCG tablet Take 1 tablet (100 mcg total) by mouth daily before breakfast.   metoprolol  tartrate (LOPRESSOR ) 25 MG tablet Take 0.5 tablets (12.5 mg total) by mouth 2 (two) times daily.    predniSONE  (DELTASONE ) 20 MG tablet Take 2 tablets (40 mg total) by mouth daily with breakfast for 1 day, THEN 1.5 tablets (30 mg total) daily with breakfast for 1 day, THEN 1 tablet (20 mg total) daily with breakfast for 1 day, THEN 0.5 tablets (10 mg total) daily with breakfast for 1 day.   No facility-administered encounter medications on file as of 12/31/2023.    Review of Systems  Immunization History  Administered Date(s) Administered   Hepatitis B, ADULT 11/02/2023, 12/03/2023   Influenza Split 07/11/2011   Influenza, High Dose Seasonal PF 05/26/2022   Influenza,inj,Quad PF,6+ Mos 04/20/2019   Influenza,inj,Quad PF,6-35 Mos 04/20/2019   Influenza-Unspecified 07/11/2011, 07/13/2023   PFIZER(Purple Top)SARS-COV-2 Vaccination 12/06/2019, 12/27/2019   Pneumococcal Conjugate,unspecified 07/13/2023   Pneumococcal Polysaccharide-23 04/28/2021   Tdap 02/23/2018, 06/16/2021   Unspecified SARS-COV-2 Vaccination 07/16/2023   Zoster Recombinant(Shingrix) 07/16/2023, 11/02/2023   Pertinent  Health Maintenance Due  Topic Date Due   FOOT EXAM  Never done   OPHTHALMOLOGY EXAM  Never done   MAMMOGRAM  12/23/2019   HEMOGLOBIN A1C  09/12/2022   INFLUENZA VACCINE  03/04/2024  03/12/2022    9:25 AM 04/03/2022   11:00 AM 05/05/2022   11:05 AM 05/28/2022   11:05 AM 12/31/2023    9:56 AM  Fall Risk  Falls in the past year? 0 0  0 0  Was there an injury with Fall? 0 0  0 0  Fall Risk Category Calculator 0 0  0 0  Fall Risk Category (Retired) Low Low  Low   (RETIRED) Patient Fall Risk Level Low fall risk Low fall risk Low fall risk Low fall risk   Patient at Risk for Falls Due to No Fall Risks No Fall Risks  No Fall Risks No Fall Risks  Fall risk Follow up  Falls evaluation completed  Falls evaluation completed Falls evaluation completed   Functional Status Survey:    Vitals:   12/31/23 1000  BP: 128/72  Pulse: 85  Resp: 18  Temp: 97.6 F (36.4 C)  SpO2: 97%  Weight: 149 lb 6.4 oz  (67.8 kg)  Height: 4\' 11"  (1.499 m)   Body mass index is 30.18 kg/m. Physical Exam VITALS: T- 97.6, P- 85, BP- 128/72, SaO2- 97% MEASUREMENTS: Weight- 149. GENERAL: Alert, cooperative, well developed, no acute distress HEENT: Normocephalic, normal oropharynx, moist mucous membranes CHEST: Clear to auscultation bilaterally, no wheezes, rhonchi, or crackles CARDIOVASCULAR: Normal heart rate and rhythm, S1 and S2 normal without murmurs ABDOMEN: Soft, non-tender, non-distended, without organomegaly, normal bowel sounds EXTREMITIES: No cyanosis or edema NEUROLOGICAL: Cranial nerves grossly intact, moves all extremities without gross motor or sensory deficit SKIN: Raised red hives all over body, including face, Trunk, neck, ears, and scalp   PSYCHIATRY/BEHAVIORAL: Mood stable    Labs reviewed: No results for input(s): "NA", "K", "CL", "CO2", "GLUCOSE", "BUN", "CREATININE", "CALCIUM", "MG", "PHOS" in the last 8760 hours. No results for input(s): "AST", "ALT", "ALKPHOS", "BILITOT", "PROT", "ALBUMIN" in the last 8760 hours. No results for input(s): "WBC", "NEUTROABS", "HGB", "HCT", "MCV", "PLT" in the last 8760 hours. Lab Results  Component Value Date   TSH 1.680 10/08/2023   Lab Results  Component Value Date   HGBA1C 6.3 (H) 03/12/2022   Lab Results  Component Value Date   CHOL 181 04/17/2021   HDL 51 04/17/2021   LDLCALC 100 (H) 04/17/2021   TRIG 179 (H) 04/17/2021   CHOLHDL 3.5 04/17/2021    Significant Diagnostic Results in last 30 days:  No results found.  Assessment/Plan    Urticaria Acute onset of generalized urticaria with hives on the face, neck, ears, scalp, chest, back, stomach, and legs. Symptoms began yesterday afternoon. No known exposure to new foods, medications, or environmental allergens. No recent changes in lotions, soaps, or detergents. No associated respiratory symptoms such as throat swelling or dysphagia. Differential diagnosis includes allergic reaction  to an unknown allergen or stress-related urticaria. She reports significant stress due to family responsibilities, which may contribute to symptoms. No known drug allergies. - Prescribe prednisone with a tapering dose regimen, starting with a higher dose and gradually decreasing. Emphasize completing the medication course even if symptoms improve. - Advise taking loratadine  for additional relief from pruritus if needed. - Recommend using a cold wet washcloth or ice pack on affected areas to alleviate pruritus. Advise against using warm or hot water  as it may exacerbate symptoms. - Instruct to monitor for any changes in household products or foods that could be potential allergens. - Send prescription to [facility] pharmacy on [address]. - Advise to eat before taking prednisone to prevent gastric upset. - Schedule follow-up  if symptoms persist after completing medication.   Family/ staff Communication: Reviewed plan of care with patient verbalized understanding   Labs/tests ordered: None   Next Appointment: Return if symptoms worsen or fail to improve.   Total time: 20 minutes. Greater than 50% of total time spent doing patient education regarding urticaria,health maintenance including symptom/medication management.   Estil Heman, NP

## 2024-01-11 ENCOUNTER — Other Ambulatory Visit

## 2024-01-11 DIAGNOSIS — D509 Iron deficiency anemia, unspecified: Secondary | ICD-10-CM

## 2024-01-11 DIAGNOSIS — E1140 Type 2 diabetes mellitus with diabetic neuropathy, unspecified: Secondary | ICD-10-CM

## 2024-01-12 LAB — CBC WITH DIFFERENTIAL/PLATELET
Absolute Lymphocytes: 1460 {cells}/uL (ref 850–3900)
Absolute Monocytes: 363 {cells}/uL (ref 200–950)
Basophils Absolute: 20 {cells}/uL (ref 0–200)
Basophils Relative: 0.4 %
Eosinophils Absolute: 142 {cells}/uL (ref 15–500)
Eosinophils Relative: 2.9 %
HCT: 36.9 % (ref 35.0–45.0)
Hemoglobin: 11.7 g/dL (ref 11.7–15.5)
MCH: 29.7 pg (ref 27.0–33.0)
MCHC: 31.7 g/dL — ABNORMAL LOW (ref 32.0–36.0)
MCV: 93.7 fL (ref 80.0–100.0)
MPV: 8.6 fL (ref 7.5–12.5)
Monocytes Relative: 7.4 %
Neutro Abs: 2916 {cells}/uL (ref 1500–7800)
Neutrophils Relative %: 59.5 %
Platelets: 273 Thousand/uL (ref 140–400)
RBC: 3.94 Million/uL (ref 3.80–5.10)
RDW: 14 % (ref 11.0–15.0)
Total Lymphocyte: 29.8 %
WBC: 4.9 Thousand/uL (ref 3.8–10.8)

## 2024-01-12 LAB — LIPID PANEL
Cholesterol: 209 mg/dL — ABNORMAL HIGH (ref <200)
HDL: 50 mg/dL (ref >=50)
LDL Cholesterol (Calc): 123 mg/dL — ABNORMAL HIGH
Non-HDL Cholesterol (Calc): 159 mg/dL — ABNORMAL HIGH (ref <130)
Total CHOL/HDL Ratio: 4.2 (calc) (ref <5.0)
Triglycerides: 253 mg/dL — ABNORMAL HIGH (ref <150)

## 2024-01-12 LAB — HEMOGLOBIN A1C
Hgb A1c MFr Bld: 6.4 % — ABNORMAL HIGH (ref <5.7)
Mean Plasma Glucose: 137 mg/dL
eAG (mmol/L): 7.6 mmol/L

## 2024-01-12 LAB — COMPLETE METABOLIC PANEL WITHOUT GFR
AG Ratio: 1.6 (calc) (ref 1.0–2.5)
ALT: 12 U/L (ref 6–29)
AST: 14 U/L (ref 10–35)
Albumin: 4.4 g/dL (ref 3.6–5.1)
Alkaline phosphatase (APISO): 75 U/L (ref 37–153)
BUN: 18 mg/dL (ref 7–25)
CO2: 27 mmol/L (ref 20–32)
Calcium: 8.7 mg/dL (ref 8.6–10.4)
Chloride: 103 mmol/L (ref 98–110)
Creat: 0.82 mg/dL (ref 0.50–1.05)
Globulin: 2.8 g/dL (ref 1.9–3.7)
Glucose, Bld: 108 mg/dL — ABNORMAL HIGH (ref 65–99)
Potassium: 3.9 mmol/L (ref 3.5–5.3)
Sodium: 140 mmol/L (ref 135–146)
Total Bilirubin: 0.6 mg/dL (ref 0.2–1.2)
Total Protein: 7.2 g/dL (ref 6.1–8.1)

## 2024-01-14 ENCOUNTER — Ambulatory Visit: Payer: Self-pay | Admitting: Family

## 2024-01-14 ENCOUNTER — Ambulatory Visit (INDEPENDENT_AMBULATORY_CARE_PROVIDER_SITE_OTHER): Admitting: Family

## 2024-01-14 ENCOUNTER — Encounter: Payer: Self-pay | Admitting: Family

## 2024-01-14 VITALS — BP 114/70 | HR 95 | Temp 97.7°F | Ht 59.0 in | Wt 147.6 lb

## 2024-01-14 DIAGNOSIS — I10 Essential (primary) hypertension: Secondary | ICD-10-CM | POA: Diagnosis not present

## 2024-01-14 DIAGNOSIS — R7303 Prediabetes: Secondary | ICD-10-CM

## 2024-01-14 DIAGNOSIS — Z1211 Encounter for screening for malignant neoplasm of colon: Secondary | ICD-10-CM

## 2024-01-14 DIAGNOSIS — E785 Hyperlipidemia, unspecified: Secondary | ICD-10-CM | POA: Diagnosis not present

## 2024-01-14 DIAGNOSIS — R000 Tachycardia, unspecified: Secondary | ICD-10-CM

## 2024-01-14 MED ORDER — METOPROLOL TARTRATE 25 MG PO TABS: 12.5000 mg | ORAL_TABLET | Freq: Two times a day (BID) | ORAL | 1 refills | Status: AC

## 2024-01-15 ENCOUNTER — Ambulatory Visit: Payer: Self-pay | Admitting: Family

## 2024-01-15 LAB — MICROALBUMIN / CREATININE URINE RATIO
Creatinine, Urine: 49 mg/dL (ref 20–275)
Microalb Creat Ratio: 16 mg/g{creat} (ref <30)
Microalb, Ur: 0.8 mg/dL

## 2024-01-22 NOTE — Progress Notes (Signed)
 Provider: Christean Courts FNP-C   Jereline Ticer, Elijio Guadeloupe, NP  Patient Care Team: Zayla Agar, Elijio Guadeloupe, NP as PCP - General (Family Medicine) Olinda Bertrand, DO as PCP - Cardiology (Cardiology)  Extended Emergency Contact Information Primary Emergency Contact: Tingler,Serafin Address: [address]          [place], [place] [address] [place] Home Phone: [phone] Mobile Phone: [phone] Relation: Spouse Secondary Emergency Contact: Lyster,Steven Home Phone: [phone] Mobile Phone: [phone] Relation: Son  Code Status:  Full Code  Goals of care: Advanced Directive information    12/31/2023    9:57 AM  Advanced Directives  Does Patient Have a Medical Advance Directive? No  Would patient like information on creating a medical advance directive? No - Patient declined     Chief Complaint  Patient presents with   Follow-up    3 month follow up     Discussed the use of AI scribe software for clinical note transcription with the patient, who gave verbal consent to proceed.  History of Present Illness   Amy Morrow is a 63 year old female with prediabetes who presents for a routine follow-up visit.  She has prediabetes with a recent A1c of 6.4, slightly increased from 6.3 three months ago. She is managing her diet by reducing flour and sugar intake and wants to eat more vegetables despite disliking them.  She has a history of uterine cancer treated with surgery, radiation, and chemotherapy. She experiences fatigue, which she attributes to past cancer treatments. Last week, she was extremely tired and irritable for about five days, affecting her ability to cook and interact with her children.  Her cholesterol levels are elevated, with triglycerides at 253 and LDL cholesterol at 123. She is not on any cholesterol medication. She takes metoprolol  for blood pressure management, with her last refill in March.  She has a history of thyroid  issues and is  under the care of a specialist. She is currently on medication for her thyroid  condition.  No fever, chills, depression, numbness, or tingling in legs or feet. She has not had a mammogram this year and plans to schedule one. She also has not completed a Cologuard test for colon cancer screening and prefers the at-home kit option.  She does not consume alcohol and has lost weight since her last visit, now weighing 147.6 pounds.    Past Medical History:  Diagnosis Date   Abnormal Pap smear    Anemia    Cervical cancer [facility]) oncologist- dr Pearly Bound   Stage IB1  SCCa    Dyspnea    Fatigue    History of radiation therapy 11/01/2018   IMRT 09/27/2018-11/01/2018 to Pelvis, Vagina / 45 Gy in 25 fractions  Dr Retta Caster   History of radiation therapy 12/01/2018   Vaginal Cuff (area of recurrence) / 24 Gy in 4 fractions 11/10/2018-12/01/2018  Dr Retta Caster   Hyperlipidemia    Left breast mass 2013   7x4x4 mm 6o'clock   PONV (postoperative nausea and vomiting)    Pre-diabetes    Sepsis (HCC) 03/2018   Urosepsis, Resolved   Thyroid  nodule 03/25/2018   inferior right thyroid , noted on US  thyroid    Ureterovaginal fistula    Urgency of urination    intermittant   Urinary frequency    Past Surgical History:  Procedure Laterality Date   ABDOMINAL HYSTERECTOMY     BREAST BIOPSY Left 06/15/2012   CYSTOSCOPY W/ RETROGRADES Bilateral 02/16/2018   Procedure: CYSTOSCOPY WITH RETROGRADE  PYELOGRAM BILATERAL DIAGNOSTIC URETEROSCOPY, BILATERAL  STENT PLACEMENT;  Surgeon: Andrez Banker, MD;  Location: [facility];  Service: Urology;  Laterality: Bilateral;   CYSTOSCOPY W/ URETERAL STENT PLACEMENT Bilateral 04/22/2018   Procedure: CYSTOSCOPY WITH BILATERAL RETROGRADE PYELOGRAM/ AND URETERAL STENT EXCHANGE, vaginal exam;  Surgeon: Andrez Banker, MD;  Location: [facility];  Service: Urology;  Laterality: Bilateral;   CYSTOSCOPY W/ URETERAL STENT PLACEMENT Bilateral 05/28/2018   Procedure:  CYSTOSCOPY WITH BILATERAL  RETROGRADE PYELOGRAM/URETERAL STENT REPLACEMENT, REMOVAL OF BILATERAL PERCUTANEOUS DRAINS;  Surgeon: Andrez Banker, MD;  Location: [facility];  Service: Urology;  Laterality: Bilateral;   D & C LEEP CONIZATION BX  07-31-2003   dr p. rose [facility]   IR IMAGING GUIDED PORT INSERTION  09/27/2018   IR NEPHROSTOGRAM RIGHT THRU EXISTING ACCESS  03/18/2018   IR NEPHROSTOMY EXCHANGE LEFT  04/27/2018   IR NEPHROSTOMY EXCHANGE RIGHT  04/27/2018   IR NEPHROSTOMY PLACEMENT LEFT  03/18/2018   IR NEPHROSTOMY PLACEMENT RIGHT  03/02/2018   IR REMOVAL TUN ACCESS W/ PORT W/O FL MOD SED  03/17/2019   PELVIC LYMPH NODE DISSECTION Bilateral 02/08/2018   Procedure: BILATEAL PEVIC  LYMPHADENECTOMY;  Surgeon: Alphonso Aschoff, MD;  Location: [facility];  Service: Gynecology;  Laterality: Bilateral;   ROBOTIC ASSISTED TOTAL HYSTERECTOMY WITH BILATERAL SALPINGO OOPHERECTOMY N/A 02/08/2018   Procedure: XI ROBOTIC ASSISTED TOTAL Radical HYSTERECTOMY WITH BILATERAL SALPINGO OOPHORECTOMY;  Surgeon: Alphonso Aschoff, MD;  Location: [facility];  Service: Gynecology;  Laterality: N/A;   Transvaginal tape placement  03-12-2009  dr Onnie Bilis  [facility]   GYNECARE TENSION-FREE VAGINAL TAPE SLING   TUBAL LIGATION  05-28-2005  DR  MARSHALL  @ [facility]   PPTL    No Known Allergies  Allergies as of 01/14/2024   No Known Allergies      Medication List        Accurate as of January 14, 2024 11:59 PM. If you have any questions, ask your nurse or doctor.          calcitRIOL  0.25 MCG capsule Commonly known as: ROCALTROL  Take 1 capsule (0.25 mcg total) by mouth 3 (three) times daily. What changed: when to take this   calcium-vitamin D 500-200 MG-UNIT Tabs tablet Commonly known as: OSCAL WITH D Take by mouth.   levothyroxine  100 MCG tablet Commonly known as: Euthyrox  Take 1 tablet (100 mcg total) by mouth daily before breakfast.   metoprolol  tartrate 25 MG tablet Commonly known as: LOPRESSOR  Take 0.5 tablets  (12.5 mg total) by mouth 2 (two) times daily.   Vitamin C  500 MG Chew Chew 1 tablet (500 mg total) by mouth daily.        Review of Systems  Constitutional:  Negative for appetite change, chills, fatigue, fever and unexpected weight change.  HENT:  Negative for congestion, dental problem, ear discharge, ear pain, facial swelling, hearing loss, nosebleeds, postnasal drip, rhinorrhea, sinus pressure, sinus pain, sneezing, sore throat, tinnitus and trouble swallowing.   Eyes:  Negative for pain, discharge, redness, itching and visual disturbance.  Respiratory:  Negative for cough, chest tightness, shortness of breath and wheezing.   Cardiovascular:  Negative for chest pain, palpitations and leg swelling.  Gastrointestinal:  Negative for abdominal distention, abdominal pain, blood in stool, constipation, diarrhea, nausea and vomiting.  Endocrine: Negative for cold intolerance, heat intolerance, polydipsia, polyphagia and polyuria.  Genitourinary:  Negative for difficulty urinating, dysuria, flank pain, frequency and urgency.  Musculoskeletal:  Negative for arthralgias, back pain,  gait problem, joint swelling, myalgias, neck pain and neck stiffness.  Skin:  Negative for color change, pallor, rash and wound.  Neurological:  Negative for dizziness, syncope, speech difficulty, weakness, light-headedness, numbness and headaches.  Hematological:  Does not bruise/bleed easily.  Psychiatric/Behavioral:  Negative for agitation, behavioral problems, confusion, hallucinations, self-injury, sleep disturbance and suicidal ideas. The patient is not nervous/anxious.     Immunization History  Administered Date(s) Administered   Hepatitis B, ADULT 11/02/2023, 12/03/2023   Influenza Split 07/11/2011   Influenza, High Dose Seasonal PF 05/26/2022   Influenza,inj,Quad PF,6+ Mos 04/20/2019   Influenza,inj,Quad PF,6-35 Mos 04/20/2019   Influenza-Unspecified 07/11/2011, 07/13/2023   PFIZER(Purple Top)SARS-COV-2  Vaccination 12/06/2019, 12/27/2019   Pneumococcal Conjugate,unspecified 07/13/2023   Pneumococcal Polysaccharide-23 04/28/2021   Tdap 02/23/2018, 06/16/2021   Unspecified SARS-COV-2 Vaccination 07/16/2023   Zoster Recombinant(Shingrix) 07/16/2023, 11/02/2023   Pertinent  Health Maintenance Due  Topic Date Due   FOOT EXAM  Never done   OPHTHALMOLOGY EXAM  Never done   MAMMOGRAM  12/23/2019   INFLUENZA VACCINE  03/04/2024   HEMOGLOBIN A1C  07/12/2024      03/12/2022    9:25 AM 04/03/2022   11:00 AM 05/05/2022   11:05 AM 05/28/2022   11:05 AM 12/31/2023    9:56 AM  Fall Risk  Falls in the past year? 0 0  0 0  Was there an injury with Fall? 0 0  0 0  Fall Risk Category Calculator 0 0  0 0  Fall Risk Category (Retired) Low  Low   Low    (RETIRED) Patient Fall Risk Level Low fall risk  Low fall risk  Low fall risk  Low fall risk    Patient at Risk for Falls Due to No Fall Risks No Fall Risks  No Fall Risks No Fall Risks  Fall risk Follow up  Falls evaluation completed   Falls evaluation completed  Falls evaluation completed     Data saved with a previous flowsheet row definition   Functional Status Survey:    Vitals:   01/14/24 0830  BP: 114/70  Pulse: 95  Temp: 97.7 F (36.5 C)  SpO2: 98%  Weight: 147 lb 9.6 oz (67 kg)  Height: 4' 11 (1.499 m)   Body mass index is 29.81 kg/m. Physical Exam  VITALS: T- 97.7, P- 80, BP- 114/70, SaO2- 98% MEASUREMENTS: Weight- 147.6. GENERAL: Alert, cooperative, well developed, no acute distress. HEENT: Normocephalic, normal oropharynx, moist mucous membranes. Right ear canal obstructed with cerumen, eardrum not visible. Left ear normal. Nose normal. No sinus tenderness. NECK: Neck normal. CHEST: Clear to auscultation bilaterally, no wheezes, rhonchi, or crackles. CARDIOVASCULAR: Normal heart rate and rhythm, S1 and S2 normal without murmurs. ABDOMEN: Soft, non-tender, non-distended, without organomegaly. Normal bowel  sounds. EXTREMITIES: No cyanosis or edema. Extremities non-tender. NEUROLOGICAL: Cranial nerves grossly intact, moves all extremities without gross motor or sensory deficit. Sensation intact in feet bilaterally.  SKIN: No rash,no lesion or erythema   PSYCHIATRY/BEHAVIORAL: Mood stable    Labs reviewed: Recent Labs    01/11/24 0803  NA 140  K 3.9  CL 103  CO2 27  GLUCOSE 108*  BUN 18  CREATININE 0.82  CALCIUM 8.7   Recent Labs    01/11/24 0803  AST 14  ALT 12  BILITOT 0.6  PROT 7.2   Recent Labs    01/11/24 0803  WBC 4.9  NEUTROABS 2,916  HGB 11.7  HCT 36.9  MCV 93.7  PLT 273   Lab Results  Component Value Date   TSH 1.680 10/08/2023   Lab Results  Component Value Date   HGBA1C 6.4 (H) 01/11/2024   Lab Results  Component Value Date   CHOL 209 (H) 01/11/2024   HDL 50 01/11/2024   LDLCALC 123 (H) 01/11/2024   TRIG 253 (H) 01/11/2024   CHOLHDL 4.2 01/11/2024    Significant Diagnostic Results in last 30 days:  No results found.  Assessment/Plan   Prediabetes Hemoglobin A1c increased slightly from 6.3% to 6.4%, at the upper limit of the prediabetes range (5.7% to 6.4%). Emphasis on lifestyle modifications to prevent progression to diabetes. She acknowledges the need to reduce flour and sugar intake and increase vegetable consumption despite personal dislike. - Advise dietary modifications to reduce flour and sugar intake - Encourage increased vegetable consumption - Recommend regular exercise to help manage blood sugar levels  Hyperlipidemia Triglycerides elevated at 253 mg/dL, and LDL cholesterol elevated at 123 mg/dL. Targets are below 150 mg/dL for triglycerides and below 100 mg/dL for LDL cholesterol. She prefers lifestyle changes over medication, aiming to improve diet and exercise habits. - Advise dietary modifications to reduce intake of fried foods, butter, margarine, and certain oils - Encourage regular exercise - Plan to recheck lipid levels  in four months  Hypertension Blood pressure well-controlled at 114/70 mmHg. Currently on metoprolol  for hypertension. - Refill metoprolol  with a 90-day supply and one refill  Hypothyroidism Under care of an endocrinologist for hypothyroidism and taking medication. Hypothyroidism noted as a potential cause of fatigue. She will continue specialist follow-up. - Continue follow-up with endocrinologist - Ensure adherence to thyroid  medication  Uterine Cancer Uterine cancer treated with surgery, radiation, and chemotherapy. Reports fatigue, attributing it to past cancer treatments. Radiation and chemotherapy acknowledged as potential causes of fatigue.  General Health Maintenance Advised to maintain regular health screenings and follow a healthy lifestyle. She has not had a mammogram this year and is due for colon cancer screening. Recommended eye examination due to elevated blood sugar levels. - Advise scheduling a mammogram - Order Cologuard for colon cancer screening - Recommend referral to an eye specialist for a comprehensive eye exam  Follow-up Advised to follow up in four months for a routine check-up and to repeat lab work a few days prior to the appointment. - Schedule follow-up appointment in four months - Order lab work to be done a few days before the next appointment   Family/ staff Communication: Reviewed plan of care with patient verbalized understanding  Labs/tests ordered:  - CBC with Differential/Platelet - CMP with eGFR([facility]) - Hgb A1C - Lipid panel - Cologuard  Next Appointment : Return in about 4 months (around 05/15/2024) for medical mangement of chronic issues., fasting labs prior to visit.   Spent 30 minutes of Face to face and non-face to face with patient  >50% time spent counseling; reviewing medical record; tests; labs; documentation and developing future plan of care.   Estil Heman, NP

## 2024-01-29 ENCOUNTER — Encounter (HOSPITAL_COMMUNITY): Payer: Self-pay

## 2024-01-29 ENCOUNTER — Other Ambulatory Visit: Payer: Self-pay

## 2024-01-29 ENCOUNTER — Emergency Department (HOSPITAL_COMMUNITY)

## 2024-01-29 ENCOUNTER — Emergency Department (HOSPITAL_COMMUNITY)
Admission: EM | Admit: 2024-01-29 | Discharge: 2024-01-30 | Disposition: A | Discharge status: Home / Self Care | Attending: Emergency Medicine | Admitting: Emergency Medicine

## 2024-01-29 DIAGNOSIS — D649 Anemia, unspecified: Secondary | ICD-10-CM | POA: Insufficient documentation

## 2024-01-29 DIAGNOSIS — R079 Chest pain, unspecified: Secondary | ICD-10-CM | POA: Insufficient documentation

## 2024-01-29 DIAGNOSIS — Z8541 Personal history of malignant neoplasm of cervix uteri: Secondary | ICD-10-CM | POA: Diagnosis not present

## 2024-01-29 LAB — BASIC METABOLIC PANEL WITH GFR
Anion gap: 13 (ref 5–15)
BUN: 22 mg/dL (ref 8–23)
CO2: 25 mmol/L (ref 22–32)
Calcium: 8.3 mg/dL — ABNORMAL LOW (ref 8.9–10.3)
Chloride: 101 mmol/L (ref 98–111)
Creatinine, Ser: 0.85 mg/dL (ref 0.44–1.00)
GFR, Estimated: >60 mL/min (ref >60)
Glucose, Bld: 115 mg/dL — ABNORMAL HIGH (ref 70–99)
Potassium: 3.5 mmol/L (ref 3.5–5.1)
Sodium: 139 mmol/L (ref 135–145)

## 2024-01-29 LAB — CBC
HCT: 34.7 % — ABNORMAL LOW (ref 36.0–46.0)
Hemoglobin: 11.5 g/dL — ABNORMAL LOW (ref 12.0–15.0)
MCH: 29.9 pg (ref 26.0–34.0)
MCHC: 33.1 g/dL (ref 30.0–36.0)
MCV: 90.4 fL (ref 80.0–100.0)
Platelets: 276 10*3/uL (ref 150–400)
RBC: 3.84 MIL/uL — ABNORMAL LOW (ref 3.87–5.11)
RDW: 13.4 % (ref 11.5–15.5)
WBC: 4.4 10*3/uL (ref 4.0–10.5)
nRBC: 0 % (ref 0.0–0.2)

## 2024-01-29 LAB — TROPONIN I (HIGH SENSITIVITY): Troponin I (High Sensitivity): <2 ng/L (ref <18)

## 2024-01-29 NOTE — ED Provider Notes (Signed)
  [place] EMERGENCY DEPARTMENT AT [facility] Provider Note   CSN: [id number] Arrival date & time: 01/29/24  2001     Patient presents with: Chest Pain and Back Pain   Amy Morrow is a 63 y.o. female.  {Add pertinent medical, surgical, social history, OB history to YEP:67052} The history is provided by the patient and medical records.  Chest Pain Associated symptoms: back pain   Back Pain Associated symptoms: chest pain        Prior to Admission medications   Medication Sig Start Date End Date Taking? Authorizing Provider  Ascorbic Acid (VITAMIN C ) 500 MG CHEW Chew 1 tablet (500 mg total) by mouth daily. 04/17/22   Medina-Vargas, Monina C, NP  calcitRIOL  (ROCALTROL ) 0.25 MCG capsule Take 1 capsule (0.25 mcg total) by mouth 3 (three) times daily. Patient taking differently: Take 0.25 mcg by mouth 2 (two) times daily. 04/04/22   Medina-Vargas, Monina C, NP  calcium-vitamin D (OSCAL WITH D) 500-200 MG-UNIT TABS tablet Take by mouth. 05/25/19   [provider]  levothyroxine  (EUTHYROX ) 100 MCG tablet Take 1 tablet (100 mcg total) by mouth daily before breakfast. 04/04/22   Medina-Vargas, Monina C, NP  metoprolol  tartrate (LOPRESSOR ) 25 MG tablet Take 0.5 tablets (12.5 mg total) by mouth 2 (two) times daily. 01/14/24   Ngetich, Dinah C, NP    Allergies: Patient has no known allergies.    Review of Systems  Cardiovascular:  Positive for chest pain.  Musculoskeletal:  Positive for back pain.    Updated Vital Signs BP (!) 154/75   Pulse 95   Temp 99.1 F (37.3 C) (Oral)   Resp 18   LMP 11/16/2012   SpO2 99%   Physical Exam  (all labs ordered are listed, but only abnormal results are displayed) Labs Reviewed  BASIC METABOLIC PANEL WITH GFR - Abnormal; Notable for the following components:      Result Value   Glucose, Bld 115 (*)    Calcium 8.3 (*)    All other components within normal limits  CBC - Abnormal; Notable for the following components:    RBC 3.84 (*)    Hemoglobin 11.5 (*)    HCT 34.7 (*)    All other components within normal limits  D-DIMER, QUANTITATIVE  TROPONIN I (HIGH SENSITIVITY)  TROPONIN I (HIGH SENSITIVITY)    EKG: None  Radiology: No results found.  {Document cardiac monitor, telemetry assessment procedure when appropriate:32947} Procedures   Medications Ordered in the ED - No data to display    {Click here for ABCD2, HEART and other calculators REFRESH Note before signing:1}                              Medical Decision Making Amount and/or Complexity of Data Reviewed Labs: ordered. Radiology: ordered.   ***  {Document critical care time when appropriate  Document review of labs and clinical decision tools ie CHADS2VASC2, etc  Document your independent review of radiology images and any outside records  Document your discussion with family members, caretakers and with consultants  Document social determinants of health affecting pt's care  Document your decision making why or why not admission, treatments were needed:32947:::1}   Final diagnoses:  None    ED Discharge Orders     None
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
## 2024-01-30 LAB — D-DIMER, QUANTITATIVE: D-Dimer, Quant: 0.28 ug{FEU}/mL (ref 0.00–0.50)

## 2024-01-30 LAB — TROPONIN I (HIGH SENSITIVITY): Troponin I (High Sensitivity): <2 ng/L (ref <18)

## 2024-01-30 NOTE — Discharge Instructions (Addendum)
 Cardiac workup today was reassuring.  May be related to stress. Please follow-up closely with your primary care doctor. Return here for any new or acute changes.

## 2024-02-13 LAB — COLOGUARD: COLOGUARD: NEGATIVE

## 2024-04-05 DIAGNOSIS — N6312 Unspecified lump in the right breast, upper inner quadrant: Secondary | ICD-10-CM | POA: Diagnosis not present

## 2024-04-05 DIAGNOSIS — R92321 Mammographic fibroglandular density, right breast: Secondary | ICD-10-CM | POA: Diagnosis not present

## 2024-04-05 DIAGNOSIS — Z1231 Encounter for screening mammogram for malignant neoplasm of breast: Secondary | ICD-10-CM | POA: Diagnosis not present

## 2024-04-21 ENCOUNTER — Telehealth: Payer: Self-pay

## 2024-04-21 NOTE — Telephone Encounter (Signed)
 Immunization vaccines were faxed over from patients pharmacy. Documented COVID and Flu vaccines as well as indexed them into patients chart .

## 2024-04-22 DIAGNOSIS — N6001 Solitary cyst of right breast: Secondary | ICD-10-CM | POA: Diagnosis not present

## 2024-05-12 ENCOUNTER — Other Ambulatory Visit

## 2024-05-16 ENCOUNTER — Ambulatory Visit: Admitting: Family

## 2024-05-19 ENCOUNTER — Other Ambulatory Visit

## 2024-05-19 DIAGNOSIS — I10 Essential (primary) hypertension: Secondary | ICD-10-CM

## 2024-05-19 DIAGNOSIS — R7303 Prediabetes: Secondary | ICD-10-CM

## 2024-05-19 DIAGNOSIS — E785 Hyperlipidemia, unspecified: Secondary | ICD-10-CM

## 2024-05-20 LAB — CBC WITH DIFFERENTIAL/PLATELET
Absolute Lymphocytes: 1656 {cells}/uL (ref 850–3900)
Absolute Monocytes: 313 {cells}/uL (ref 200–950)
Basophils Absolute: 18 {cells}/uL (ref 0–200)
Basophils Relative: 0.4 %
Eosinophils Absolute: 120 {cells}/uL (ref 15–500)
Eosinophils Relative: 2.6 %
HCT: 36.3 % (ref 35.0–45.0)
Hemoglobin: 11.9 g/dL (ref 11.7–15.5)
MCH: 30.4 pg (ref 27.0–33.0)
MCHC: 32.8 g/dL (ref 32.0–36.0)
MCV: 92.8 fL (ref 80.0–100.0)
MPV: 8.9 fL (ref 7.5–12.5)
Monocytes Relative: 6.8 %
Neutro Abs: 2493 {cells}/uL (ref 1500–7800)
Neutrophils Relative %: 54.2 %
Platelets: 252 Thousand/uL (ref 140–400)
RBC: 3.91 Million/uL (ref 3.80–5.10)
RDW: 13.7 % (ref 11.0–15.0)
Total Lymphocyte: 36 %
WBC: 4.6 Thousand/uL (ref 3.8–10.8)

## 2024-05-20 LAB — COMPLETE METABOLIC PANEL WITHOUT GFR
AG Ratio: 1.5 (calc) (ref 1.0–2.5)
ALT: 9 U/L (ref 6–29)
AST: 14 U/L (ref 10–35)
Albumin: 4.5 g/dL (ref 3.6–5.1)
Alkaline phosphatase (APISO): 81 U/L (ref 37–153)
BUN: 17 mg/dL (ref 7–25)
CO2: 25 mmol/L (ref 20–32)
Calcium: 8.8 mg/dL (ref 8.6–10.4)
Chloride: 101 mmol/L (ref 98–110)
Creat: 0.82 mg/dL (ref 0.50–1.05)
Globulin: 3.1 g/dL (ref 1.9–3.7)
Glucose, Bld: 106 mg/dL — ABNORMAL HIGH (ref 65–99)
Potassium: 3.8 mmol/L (ref 3.5–5.3)
Sodium: 139 mmol/L (ref 135–146)
Total Bilirubin: 0.6 mg/dL (ref 0.2–1.2)
Total Protein: 7.6 g/dL (ref 6.1–8.1)

## 2024-05-20 LAB — LIPID PANEL
Cholesterol: 239 mg/dL — ABNORMAL HIGH (ref <200)
HDL: 64 mg/dL (ref >=50)
LDL Cholesterol (Calc): 148 mg/dL — ABNORMAL HIGH
Non-HDL Cholesterol (Calc): 175 mg/dL — ABNORMAL HIGH (ref <130)
Total CHOL/HDL Ratio: 3.7 (calc) (ref <5.0)
Triglycerides: 138 mg/dL (ref <150)

## 2024-05-20 LAB — HEMOGLOBIN A1C
Hgb A1c MFr Bld: 6.2 % — ABNORMAL HIGH (ref <5.7)
Mean Plasma Glucose: 131 mg/dL
eAG (mmol/L): 7.3 mmol/L

## 2024-05-26 ENCOUNTER — Encounter: Payer: Self-pay | Admitting: Family

## 2024-05-26 ENCOUNTER — Ambulatory Visit (INDEPENDENT_AMBULATORY_CARE_PROVIDER_SITE_OTHER): Admitting: Family

## 2024-05-26 VITALS — BP 118/76 | HR 70 | Temp 97.8°F | Resp 20 | Ht 59.0 in | Wt 147.6 lb

## 2024-05-26 DIAGNOSIS — D509 Iron deficiency anemia, unspecified: Secondary | ICD-10-CM

## 2024-05-26 DIAGNOSIS — I10 Essential (primary) hypertension: Secondary | ICD-10-CM | POA: Diagnosis not present

## 2024-05-26 DIAGNOSIS — E1140 Type 2 diabetes mellitus with diabetic neuropathy, unspecified: Secondary | ICD-10-CM

## 2024-05-26 DIAGNOSIS — H6121 Impacted cerumen, right ear: Secondary | ICD-10-CM

## 2024-05-26 DIAGNOSIS — E785 Hyperlipidemia, unspecified: Secondary | ICD-10-CM

## 2024-05-26 MED ORDER — ATORVASTATIN CALCIUM 10 MG PO TABS: 10.0000 mg | ORAL_TABLET | Freq: Every day | ORAL | 3 refills | Status: AC

## 2024-05-26 MED ORDER — DEBROX 6.5 % OT SOLN: 5.0000 [drp] | Freq: Two times a day (BID) | OTIC | 0 refills | Status: AC

## 2024-05-26 MED ORDER — ASPIRIN 81 MG PO TBEC: 81.0000 mg | DELAYED_RELEASE_TABLET | Freq: Every day | ORAL | Status: AC

## 2024-05-26 NOTE — Patient Instructions (Signed)
 -  Instill debrox 6.5 otic solution 5 drops into right ear twice daily x 4 days then follow up for ear lavage.May apply cotton ball at bedtime to prevent drainage to pillow.

## 2024-06-08 NOTE — Progress Notes (Signed)
 Provider: Roxan Plough FNP-C   Aveen Stansel, Roxan BROCKS, NP  Patient Care Team: Pau Banh, Roxan BROCKS, NP as PCP - General (Family Medicine) Michele Richardson, DO as PCP - Cardiology (Cardiology)  Extended Emergency Contact Information Primary Emergency Contact: Arkwright,Serafin Address: [address]          [place], [place] [address] [place] Home Phone: [phone] Mobile Phone: [phone] Relation: Spouse Secondary Emergency Contact: Trim,Steven Home Phone: [phone] Mobile Phone: [phone] Relation: Son  Code Status: Full code Goals of care: Advanced Directive information    05/26/2024    8:57 AM  Advanced Directives  Does Patient Have a Medical Advance Directive? No  Would patient like information on creating a medical advance directive? No - Patient declined     Chief Complaint  Patient presents with   Medical Management of Chronic Issues    4 Month follow up.      Discussed the use of AI scribe software for clinical note transcription with the patient, who gave verbal consent to proceed.  History of Present Illness   Amy Morrow is a 63 year old female with diabetes and hyperlipidemia who presents for a follow-up visit. She is accompanied by Jolynn Pack Spanish Interpreter Ann.  Her recent lab work shows an improvement in her A1c, which has decreased from 6.4% four months ago to 6.2%. She has not been checking her blood sugars at home but has no symptoms such as shakiness. Her hemoglobin levels have improved from 11.5 to 11.9, and her white blood cell count is normal. Previously, she had low potassium and calcium levels.  Regarding her hyperlipidemia, her triglycerides have decreased significantly from 253 to 138. However, her total cholesterol has increased from 209 to 239, and her LDL cholesterol has risen from 123 to 148. She consumes high-fat meats like pork occasionally.  She is currently taking levothyroxine  100 mcg on an empty  stomach, metoprolol  12.5 mg twice daily, and calcium with vitamin D supplements. She does not take aspirin or check her blood pressure at home. No palpitations, chest pain, dizziness, or tiredness. She stays active with her grandchildren and has not experienced any anxiety or depression.  She has not visited an eye doctor this year and has not been to a dentist in three years. She has received her flu shot and is up to date with her immunizations, including the pneumonia vaccine.   Past Medical History:  Diagnosis Date   Abnormal Pap smear    Anemia    Cervical cancer [facility]) oncologist- dr eloy   Stage IB1  SCCa    Dyspnea    Fatigue    History of radiation therapy 11/01/2018   IMRT 09/27/2018-11/01/2018 to Pelvis, Vagina / 45 Gy in 25 fractions  Dr Lynwood Nasuti   History of radiation therapy 12/01/2018   Vaginal Cuff (area of recurrence) / 24 Gy in 4 fractions 11/10/2018-12/01/2018  Dr Lynwood Nasuti   Hyperlipidemia    Left breast mass 2013   7x4x4 mm 6o'clock   PONV (postoperative nausea and vomiting)    Pre-diabetes    Sepsis (HCC) 03/2018   Urosepsis, Resolved   Thyroid  nodule 03/25/2018   inferior right thyroid , noted on US  thyroid    Ureterovaginal fistula    Urgency of urination    intermittant   Urinary frequency    Past Surgical History:  Procedure Laterality Date   ABDOMINAL HYSTERECTOMY     BREAST BIOPSY Left 06/15/2012   CYSTOSCOPY W/ RETROGRADES Bilateral 02/16/2018   Procedure:  CYSTOSCOPY WITH RETROGRADE PYELOGRAM BILATERAL DIAGNOSTIC URETEROSCOPY, BILATERAL  STENT PLACEMENT;  Surgeon: Cam Morene ORN, MD;  Location: [facility];  Service: Urology;  Laterality: Bilateral;   CYSTOSCOPY W/ URETERAL STENT PLACEMENT Bilateral 04/22/2018   Procedure: CYSTOSCOPY WITH BILATERAL RETROGRADE PYELOGRAM/ AND URETERAL STENT EXCHANGE, vaginal exam;  Surgeon: Cam Morene ORN, MD;  Location: [facility];  Service: Urology;  Laterality: Bilateral;   CYSTOSCOPY W/ URETERAL  STENT PLACEMENT Bilateral 05/28/2018   Procedure: CYSTOSCOPY WITH BILATERAL  RETROGRADE PYELOGRAM/URETERAL STENT REPLACEMENT, REMOVAL OF BILATERAL PERCUTANEOUS DRAINS;  Surgeon: Cam Morene ORN, MD;  Location: [facility];  Service: Urology;  Laterality: Bilateral;   D & C LEEP CONIZATION BX  07-31-2003   dr p. rose [facility]   IR IMAGING GUIDED PORT INSERTION  09/27/2018   IR NEPHROSTOGRAM RIGHT THRU EXISTING ACCESS  03/18/2018   IR NEPHROSTOMY EXCHANGE LEFT  04/27/2018   IR NEPHROSTOMY EXCHANGE RIGHT  04/27/2018   IR NEPHROSTOMY PLACEMENT LEFT  03/18/2018   IR NEPHROSTOMY PLACEMENT RIGHT  03/02/2018   IR REMOVAL TUN ACCESS W/ PORT W/O FL MOD SED  03/17/2019   PELVIC LYMPH NODE DISSECTION Bilateral 02/08/2018   Procedure: BILATEAL PEVIC  LYMPHADENECTOMY;  Surgeon: Eloy Herring, MD;  Location: [facility];  Service: Gynecology;  Laterality: Bilateral;   ROBOTIC ASSISTED TOTAL HYSTERECTOMY WITH BILATERAL SALPINGO OOPHERECTOMY N/A 02/08/2018   Procedure: XI ROBOTIC ASSISTED TOTAL Radical HYSTERECTOMY WITH BILATERAL SALPINGO OOPHORECTOMY;  Surgeon: Eloy Herring, MD;  Location: [facility];  Service: Gynecology;  Laterality: N/A;   Transvaginal tape placement  03-12-2009  dr lynwood solomons  [facility]   GYNECARE TENSION-FREE VAGINAL TAPE SLING   TUBAL LIGATION  05-28-2005  DR  MARSHALL  @ [facility]   PPTL    No Known Allergies  Allergies as of 05/26/2024   No Known Allergies      Medication List        Accurate as of May 26, 2024 11:59 PM. If you have any questions, ask your nurse or doctor.          STOP taking these medications    calcitRIOL  0.25 MCG capsule Commonly known as: ROCALTROL  Stopped by: Roxan BROCKS Melchizedek Espinola       TAKE these medications    aspirin EC 81 MG tablet Take 1 tablet (81 mg total) by mouth daily. Swallow whole. Started by: Taegen Lennox C Johnanthony Wilden   atorvastatin 10 MG tablet Commonly known as: LIPITOR Take 1 tablet (10 mg total) by mouth daily. Started by: Celestine Prim C Wreatha Sturgeon    calcium-vitamin D 500-200 MG-UNIT Tabs tablet Commonly known as: OSCAL WITH D Take by mouth.   Debrox 6.5 % OTIC solution Generic drug: carbamide peroxide Place 5 drops into the right ear 2 (two) times daily for 4 days. Started by: Jesseka Drinkard C Amonie Wisser   levothyroxine  100 MCG tablet Commonly known as: Euthyrox  Take 1 tablet (100 mcg total) by mouth daily before breakfast.   metoprolol  tartrate 25 MG tablet Commonly known as: LOPRESSOR  Take 0.5 tablets (12.5 mg total) by mouth 2 (two) times daily.   Vitamin C  500 MG Chew Chew 1 tablet (500 mg total) by mouth daily.        Review of Systems  Constitutional:  Negative for appetite change, chills, fatigue, fever and unexpected weight change.  HENT:  Negative for congestion, dental problem, ear discharge, ear pain, facial swelling, hearing loss, nosebleeds, postnasal drip, rhinorrhea, sinus pressure, sinus pain, sneezing, sore throat, tinnitus and trouble swallowing.   Eyes:  Negative for pain, discharge, redness, itching and visual disturbance.  Respiratory:  Negative for cough, chest tightness, shortness of breath and wheezing.   Cardiovascular:  Negative for chest pain, palpitations and leg swelling.  Gastrointestinal:  Negative for abdominal distention, abdominal pain, blood in stool, constipation, diarrhea, nausea and vomiting.  Endocrine: Negative for cold intolerance, heat intolerance, polydipsia, polyphagia and polyuria.  Genitourinary:  Negative for difficulty urinating, dysuria, flank pain, frequency and urgency.  Musculoskeletal:  Negative for arthralgias, back pain, gait problem, joint swelling, myalgias, neck pain and neck stiffness.  Skin:  Negative for color change, pallor, rash and wound.  Neurological:  Negative for dizziness, syncope, speech difficulty, weakness, light-headedness, numbness and headaches.  Hematological:  Does not bruise/bleed easily.  Psychiatric/Behavioral:  Negative for agitation, behavioral problems,  confusion, hallucinations, self-injury, sleep disturbance and suicidal ideas. The patient is not nervous/anxious.     Immunization History  Administered Date(s) Administered   Hepatitis B, ADULT 11/02/2023, 12/03/2023   INFLUENZA, HIGH DOSE SEASONAL PF 05/26/2022   Influenza Split 07/11/2011   Influenza,inj,Quad PF,6+ Mos 04/20/2019   Influenza,inj,Quad PF,6-35 Mos 04/20/2019   Influenza-Unspecified 07/11/2011, 07/13/2023, 04/21/2024   PFIZER(Purple Top)SARS-COV-2 Vaccination 12/06/2019, 12/27/2019   Pfizer(Comirnaty)Fall Seasonal Vaccine 12 years and older 04/21/2024   Pneumococcal Conjugate,unspecified 07/13/2023   Pneumococcal Polysaccharide-23 04/28/2021   Tdap 02/23/2018, 06/16/2021   Unspecified SARS-COV-2 Vaccination 07/16/2023   Zoster Recombinant(Shingrix) 07/16/2023, 11/02/2023   Pertinent  Health Maintenance Due  Topic Date Due   FOOT EXAM  Never done   OPHTHALMOLOGY EXAM  Never done   HEMOGLOBIN A1C  11/17/2024   Mammogram  04/22/2026   Influenza Vaccine  Completed      04/03/2022   11:00 AM 05/05/2022   11:05 AM 05/28/2022   11:05 AM 12/31/2023    9:56 AM 05/26/2024    8:57 AM  Fall Risk  Falls in the past year? 0  0 0 0  Was there an injury with Fall? 0  0 0 0  Fall Risk Category Calculator 0  0 0 0  Fall Risk Category (Retired) Low   Low     (RETIRED) Patient Fall Risk Level Low fall risk  Low fall risk  Low fall risk     Patient at Risk for Falls Due to No Fall Risks  No Fall Risks No Fall Risks No Fall Risks  Fall risk Follow up Falls evaluation completed   Falls evaluation completed  Falls evaluation completed Falls evaluation completed     Data saved with a previous flowsheet row definition   Functional Status Survey:    Vitals:   05/26/24 0859  BP: 118/76  Pulse: 70  Resp: 20  Temp: 97.8 F (36.6 C)  SpO2: 99%  Weight: 147 lb 9.6 oz (67 kg)  Height: 4' 11 (1.499 m)   Body mass index is 29.81 kg/m. Physical Exam  VITALS: T- 97.8, P- 70,  BP- 118/76, SaO2- 99% MEASUREMENTS: Weight- 147.6. GENERAL: Alert, cooperative, well developed, no acute distress. HEENT: Normocephalic, normal oropharynx, moist mucous membranes. Right ear with cerumen impaction, left ear clear. Nose normal. No sinus tenderness. NECK: No cervical lymphadenopathy. Thyroid  normal. CHEST: Clear to auscultation bilaterally. No wheezes, rhonchi, or crackles. CARDIOVASCULAR: Normal heart rate and rhythm, S1 and S2 normal without murmurs. ABDOMEN: Soft, non-tender, non-distended, without organomegaly. Normal bowel sounds. EXTREMITIES: No cyanosis or edema. Knees normal. NEUROLOGICAL: Cranial nerves grossly intact. Moves all extremities without gross motor or sensory deficit. Sensation intact in feet.  SKIN: No rash,no lesion or  erythema   PSYCHIATRY/BEHAVIORAL: Mood stable    Labs reviewed: Recent Labs    01/11/24 0803 01/29/24 2043 05/19/24 0835  NA 140 139 139  K 3.9 3.5 3.8  CL 103 101 101  CO2 27 25 25   GLUCOSE 108* 115* 106*  BUN 18 22 17   CREATININE 0.82 0.85 0.82  CALCIUM 8.7 8.3* 8.8   Recent Labs    01/11/24 0803 05/19/24 0835  AST 14 14  ALT 12 9  BILITOT 0.6 0.6  PROT 7.2 7.6   Recent Labs    01/11/24 0803 01/29/24 2043 05/19/24 0835  WBC 4.9 4.4 4.6  NEUTROABS 2,916  --  2,493  HGB 11.7 11.5* 11.9  HCT 36.9 34.7* 36.3  MCV 93.7 90.4 92.8  PLT 273 276 252   Lab Results  Component Value Date   TSH 1.680 10/08/2023   Lab Results  Component Value Date   HGBA1C 6.2 (H) 05/19/2024   Lab Results  Component Value Date   CHOL 239 (H) 05/19/2024   HDL 64 05/19/2024   LDLCALC 148 (H) 05/19/2024   TRIG 138 05/19/2024   CHOLHDL 3.7 05/19/2024    Significant Diagnostic Results in last 30 days:  No results found.  Assessment/Plan  Adult Wellness Visit Routine adult wellness visit with no new concerns. Vital signs stable, slight weight decrease, no anxiety or depression. - Continue current medications and lifestyle  modifications - Encourage regular exercise and healthy diet - Schedule six-month follow-up visit  Hyperlipidemia Hyperlipidemia with increased total cholesterol from 209 mg/dL to 760 mg/dL and LDL from 876 mg/dL to 851 mg/dL. Triglycerides improved from 253 mg/dL to 861 mg/dL. Dietary modifications partially successful. - Start cholesterol-lowering medication with informed consent, discussing potential muscle pain or weakness as side effects - Monitor liver function after starting medication - Encourage dietary changes to reduce cholesterol intake, including reducing high-fat meats and increasing lean meats, nuts, seeds, and avocados - Prescribe baby aspirin 81 mg daily to reduce cardiovascular risk - Recheck cholesterol levels in one month - Follow up with liver function tests in one month  Type 2 diabetes mellitus Type 2 diabetes mellitus is well-controlled with Hemoglobin A1c decreased from 6.4% to 6.2%. No hypoglycemia symptoms. - Continue current diabetes management plan - Encourage regular blood sugar monitoring at home - Refer to Ophthalmology  Essential hypertension Essential hypertension is well-controlled with blood pressure at 118/76 mmHg. No symptoms of palpitations, chest pain, dizziness, or tiredness. - Continue metoprolol  12.5 mg twice daily - Encourage regular blood pressure monitoring at home  Iron  deficiency anemia Iron  deficiency anemia is improving with hemoglobin increased from 11.5 g/dL to 88.0 g/dL. - Continue current management and monitoring  Cerumen impaction, right ear Cerumen impaction in the right ear obstructing view of the eardrum. Left ear is clear. - Prescribe Vibrox ear drops, 5 drops in the morning and 5 drops in the evening for 4 days - Schedule follow-up visit for ear irrigation after 4 days of treatment   Family/ staff Communication: Reviewed plan of care with patient verbalized understanding  Labs/tests ordered: - CBC with  Differential/Platelet - CMP with eGFR([facility]) - TSH - Hgb A1C - Lipid panel   Next Appointment : Return in about 6 months (around 11/24/2024) for medical mangement of chronic issues., Fasting labs in 6 months prior to visit. Hepatic panel 44month.   Spent 30 minutes of Face to face and non-face to face with patient  >50% time spent counseling; reviewing medical record; tests; labs; documentation and developing  future plan of care.   Roxan JAYSON Plough, NP

## 2024-06-13 ENCOUNTER — Encounter: Payer: Self-pay | Admitting: Radiation Oncology

## 2024-06-13 ENCOUNTER — Ambulatory Visit
Admission: RE | Admit: 2024-06-13 | Discharge: 2024-06-13 | Disposition: A | Discharge status: Home / Self Care | Payer: Self-pay | Source: Ambulatory Visit | Attending: Radiation Oncology | Admitting: Radiation Oncology

## 2024-06-13 VITALS — BP 137/81 | HR 77 | Temp 97.8°F | Resp 18 | Ht 59.0 in | Wt 148.4 lb

## 2024-06-13 DIAGNOSIS — Z923 Personal history of irradiation: Secondary | ICD-10-CM | POA: Insufficient documentation

## 2024-06-13 DIAGNOSIS — Z8541 Personal history of malignant neoplasm of cervix uteri: Secondary | ICD-10-CM | POA: Insufficient documentation

## 2024-06-13 DIAGNOSIS — Z9221 Personal history of antineoplastic chemotherapy: Secondary | ICD-10-CM | POA: Insufficient documentation

## 2024-06-13 DIAGNOSIS — Z7982 Long term (current) use of aspirin: Secondary | ICD-10-CM | POA: Insufficient documentation

## 2024-06-13 DIAGNOSIS — Z79899 Other long term (current) drug therapy: Secondary | ICD-10-CM | POA: Diagnosis not present

## 2024-06-13 DIAGNOSIS — Z79890 Hormone replacement therapy (postmenopausal): Secondary | ICD-10-CM | POA: Insufficient documentation

## 2024-06-13 DIAGNOSIS — C539 Malignant neoplasm of cervix uteri, unspecified: Secondary | ICD-10-CM

## 2024-06-13 NOTE — Progress Notes (Signed)
 Amy Morrow is here today for follow up post radiation to the pelvic.  They completed their radiation on: 12/01/18   Does the patient complain of any of the following:  Pain: No Abdominal bloating: No Diarrhea/Constipation: No Nausea/Vomiting: No Vaginal Discharge: No Blood in Urine or Stool: No Urinary Issues (dysuria/incomplete emptying/ incontinence/ increased frequency/urgency): Reports nocturia at times.  Does patient report using vaginal dilator 2-3 times a week and/or sexually active 2-3 weeks: No Post radiation skin changes: No   Additional comments if applicable: Last pap 12/11/23    BP 137/81 (BP Location: Right Arm, Patient Position: Sitting, Cuff Size: Large)   Pulse 77   Temp 97.8 F (36.6 C)   Resp 18   Ht 4' 11 (1.499 m)   Wt 148 lb 6.4 oz (67.3 kg)   LMP 11/16/2012   SpO2 100%   BMI 29.97 kg/m

## 2024-06-13 NOTE — Progress Notes (Signed)
 Radiation Oncology         [phone] ________________________________  Name: Amy Morrow MRN: [id number]  Date: 06/13/2024  DOB: [birth year]  Follow-Up Visit Note  CC: Ngetich, Roxan BROCKS, NP  Alec House, MD    ICD-10-CM   1. Cervical cancer, FIGO stage IB1 (HCC) [C53.9]  C53.9       Diagnosis: Recurrent cervical cancer   Interval Since Last Radiation: 5 years, 6 months, and 12 days   Radiation treatment dates:    1. IMRT: 09/27/2018 - 11/01/2018 2. HDR: 11/10/2018, 11/18/2018, 11/24/2018, 12/01/2018   Site/dose:    1. Pelvis, Vagina / 45 Gy in 25 fractions 2. Vaginal Cuff (area of recurrence) / 24 Gy in 4 fractions  Narrative:  The patient returns today for routine follow-up. She was last seen here for follow-up on 05/07/23.          Since her last visit, she followed up with Dr. Viktoria on 12/11/23. She was noted to be doing well and NED on examination at that time. Routine Pap and HPV testing were collected at that time which were both negative.           She did present to the ED on 01/29/24 with c/o persistent chest pain for nearly 1 week (began after having an argument). ED work-up including labs and a chest x-ray were unremarkable. EKG did initially show sinus tachycardia which stabilized on its own. Her chest pain was ultimately thought to be stress induced and she was instructed to follow closely with her PCP at discharge.        In more recent history, she presented for a screening mammogram (ND) which showed a possible abnormality in the right breast. A bilateral diagnostic mammogram and right breast ultrasound was accordingly performed on 04/05/24 which revealed  a 0.4 cm mass in the 2 o'clock right breast located 4 cmfn. Imaging otherwise showed no evidence of malignancy in the left breast or evidence of right axillary lymphadenopathy. A biopsy of the mass was attempted on 09/19, however fluid was drawn from the mass upon contact and the mass was thus aspirated.  Post aspiration imaging showed complete resolution of the mass.    She denies any vaginal bleeding or pelvic pain.  She denies any hematuria or rectal bleeding.       Allergies:  has no known allergies.  Meds: Current Outpatient Medications  Medication Sig Dispense Refill   Ascorbic Acid (VITAMIN C ) 500 MG CHEW Chew 1 tablet (500 mg total) by mouth daily. 30 tablet 3   aspirin EC 81 MG tablet Take 1 tablet (81 mg total) by mouth daily. Swallow whole.     atorvastatin (LIPITOR) 10 MG tablet Take 1 tablet (10 mg total) by mouth daily. 90 tablet 3   calcium-vitamin D (OSCAL WITH D) 500-200 MG-UNIT TABS tablet Take by mouth.     levothyroxine  (EUTHYROX ) 100 MCG tablet Take 1 tablet (100 mcg total) by mouth daily before breakfast. 30 tablet 3   metoprolol  tartrate (LOPRESSOR ) 25 MG tablet Take 0.5 tablets (12.5 mg total) by mouth 2 (two) times daily. 180 tablet 1   No current facility-administered medications for this encounter.    Physical Findings: The patient is in no acute distress. Patient is alert and oriented.  height is 4' 11 (1.499 m) and weight is 148 lb 6.4 oz (67.3 kg). Her temperature is 97.8 F (36.6 C). Her blood pressure is 137/81 and her pulse is 77. Her respiration is 18 and  oxygen saturation is 100%. .  No significant changes. Lungs are clear to auscultation bilaterally. Heart has regular rate and rhythm. No palpable cervical, supraclavicular, or axillary adenopathy. Abdomen soft, non-tender, normal bowel sounds.  On pelvic examination the external genitalia were unremarkable. A speculum exam was performed. There are no mucosal lesions noted in the vaginal vault.  Mild radiation changes noted at the cuff.  on bimanual and rectovaginal examination there were no pelvic masses appreciated.  Vaginal cuff intact.   Lab Findings: Lab Results  Component Value Date   WBC 4.6 05/19/2024   HGB 11.9 05/19/2024   HCT 36.3 05/19/2024   MCV 92.8 05/19/2024   PLT 252 05/19/2024     Radiographic Findings: No results found.  Impression:  Recurrent cervical cancer; s/p salvage chemoRT completed on 01/2019  PAP and HPV from 12/08/2023 were negative.   Patient is doing well overall with no evidence of disease recurrence on clinical exam today. She is approximately >5 years out from completion of her salvage chemoradiation. Per NCCN guidelines, she can transition to seeing a regular gynecologist annually with annual PAP smears.  Plan:  Patient would prefer to see Melissa Cross NP for at least a couple of years. She is scheduled to see her next on 12/14/2023. Radiation follow-up PRN.  We appreciate the opportunity to take part in this patient's care.   30 minutes of total time was spent for this patient encounter, including preparation, face-to-face counseling with the patient and coordination of care, physical exam, and documentation of the encounter. ____________________________________   Leeroy Due, [name]   Lynwood CHARM Nasuti, PhD, MD   [facility]  Radiation Oncology Direct Dial: [phone]  Fax: [phone] [email or web address]    This document serves as a record of services personally performed by Lynwood Nasuti, MD. It was created on his behalf by Dorthy Fuse, a trained medical scribe. The creation of this record is based on the scribe's personal observations and the provider's statements to them. This document has been checked and approved by the attending provider.

## 2024-06-20 ENCOUNTER — Other Ambulatory Visit: Payer: Self-pay

## 2024-06-20 DIAGNOSIS — D509 Iron deficiency anemia, unspecified: Secondary | ICD-10-CM

## 2024-06-20 DIAGNOSIS — E1140 Type 2 diabetes mellitus with diabetic neuropathy, unspecified: Secondary | ICD-10-CM

## 2024-06-20 DIAGNOSIS — E785 Hyperlipidemia, unspecified: Secondary | ICD-10-CM

## 2024-06-20 DIAGNOSIS — I10 Essential (primary) hypertension: Secondary | ICD-10-CM

## 2024-06-21 LAB — COMPLETE METABOLIC PANEL WITHOUT GFR
AG Ratio: 1.6 (calc) (ref 1.0–2.5)
ALT: 11 U/L (ref 6–29)
AST: 18 U/L (ref 10–35)
Albumin: 4.5 g/dL (ref 3.6–5.1)
Alkaline phosphatase (APISO): 81 U/L (ref 37–153)
BUN: 23 mg/dL (ref 7–25)
CO2: 29 mmol/L (ref 20–32)
Calcium: 7.5 mg/dL — ABNORMAL LOW (ref 8.6–10.4)
Chloride: 101 mmol/L (ref 98–110)
Creat: 0.85 mg/dL (ref 0.50–1.05)
Globulin: 2.9 g/dL (ref 1.9–3.7)
Glucose, Bld: 118 mg/dL — ABNORMAL HIGH (ref 65–99)
Potassium: 3.9 mmol/L (ref 3.5–5.3)
Sodium: 139 mmol/L (ref 135–146)
Total Bilirubin: 0.6 mg/dL (ref 0.2–1.2)
Total Protein: 7.4 g/dL (ref 6.1–8.1)

## 2024-06-21 LAB — CBC WITH DIFFERENTIAL/PLATELET
Absolute Lymphocytes: 1463 {cells}/uL (ref 850–3900)
Absolute Monocytes: 265 {cells}/uL (ref 200–950)
Basophils Absolute: 21 {cells}/uL (ref 0–200)
Basophils Relative: 0.4 %
Eosinophils Absolute: 80 {cells}/uL (ref 15–500)
Eosinophils Relative: 1.5 %
HCT: 36.4 % (ref 35.0–45.0)
Hemoglobin: 11.9 g/dL (ref 11.7–15.5)
MCH: 29.8 pg (ref 27.0–33.0)
MCHC: 32.7 g/dL (ref 32.0–36.0)
MCV: 91 fL (ref 80.0–100.0)
MPV: 9 fL (ref 7.5–12.5)
Monocytes Relative: 5 %
Neutro Abs: 3472 {cells}/uL (ref 1500–7800)
Neutrophils Relative %: 65.5 %
Platelets: 275 Thousand/uL (ref 140–400)
RBC: 4 Million/uL (ref 3.80–5.10)
RDW: 13.7 % (ref 11.0–15.0)
Total Lymphocyte: 27.6 %
WBC: 5.3 Thousand/uL (ref 3.8–10.8)

## 2024-06-21 LAB — LIPID PANEL
Cholesterol: 225 mg/dL — ABNORMAL HIGH (ref <200)
HDL: 57 mg/dL (ref >=50)
LDL Cholesterol (Calc): 141 mg/dL — ABNORMAL HIGH
Non-HDL Cholesterol (Calc): 168 mg/dL — ABNORMAL HIGH (ref <130)
Total CHOL/HDL Ratio: 3.9 (calc) (ref <5.0)
Triglycerides: 141 mg/dL (ref <150)

## 2024-06-21 LAB — HEMOGLOBIN A1C
Hgb A1c MFr Bld: 6.2 % — ABNORMAL HIGH (ref <5.7)
Mean Plasma Glucose: 131 mg/dL
eAG (mmol/L): 7.3 mmol/L

## 2024-06-21 LAB — TSH: TSH: 6.65 m[IU]/L — ABNORMAL HIGH (ref 0.40–4.50)

## 2024-06-23 ENCOUNTER — Ambulatory Visit: Payer: Self-pay | Admitting: Family

## 2024-06-29 NOTE — Progress Notes (Deleted)
 error

## 2024-06-29 NOTE — Progress Notes (Deleted)
 This encounter was created in error - please disregard.

## 2024-07-14 ENCOUNTER — Ambulatory Visit: Admitting: Family

## 2024-08-01 ENCOUNTER — Ambulatory Visit: Admitting: Family

## 2024-08-18 ENCOUNTER — Other Ambulatory Visit

## 2024-08-23 NOTE — Progress Notes (Deleted)
 error

## 2024-11-28 ENCOUNTER — Other Ambulatory Visit: Payer: Self-pay

## 2024-12-01 ENCOUNTER — Ambulatory Visit: Payer: Self-pay | Admitting: Family

## 2024-12-13 ENCOUNTER — Inpatient Hospital Stay: Admitting: Gynecologic Oncology
